# Patient Record
Sex: Female | Born: 1940 | Race: Black or African American | Hispanic: No | Smoking: Never smoker
Health system: Southern US, Community
[De-identification: ages and names within clinical notes are randomized; demographics above are authoritative.]

## PROBLEM LIST (undated history)

## (undated) DIAGNOSIS — M1990 Unspecified osteoarthritis, unspecified site: Secondary | ICD-10-CM

## (undated) DIAGNOSIS — Z8719 Personal history of other diseases of the digestive system: Secondary | ICD-10-CM

## (undated) DIAGNOSIS — J40 Bronchitis, not specified as acute or chronic: Secondary | ICD-10-CM

## (undated) DIAGNOSIS — E785 Hyperlipidemia, unspecified: Secondary | ICD-10-CM

## (undated) DIAGNOSIS — M1711 Unilateral primary osteoarthritis, right knee: Secondary | ICD-10-CM

## (undated) DIAGNOSIS — I10 Essential (primary) hypertension: Secondary | ICD-10-CM

## (undated) DIAGNOSIS — K219 Gastro-esophageal reflux disease without esophagitis: Secondary | ICD-10-CM

## (undated) DIAGNOSIS — E119 Type 2 diabetes mellitus without complications: Secondary | ICD-10-CM

## (undated) DIAGNOSIS — Z8711 Personal history of peptic ulcer disease: Secondary | ICD-10-CM

## (undated) DIAGNOSIS — N189 Chronic kidney disease, unspecified: Secondary | ICD-10-CM

## (undated) HISTORY — PX: ABDOMINAL HYSTERECTOMY: SHX81

## (undated) HISTORY — DX: Type 2 diabetes mellitus without complications: E11.9

## (undated) HISTORY — DX: Hyperlipidemia, unspecified: E78.5

## (undated) HISTORY — DX: Chronic kidney disease, unspecified: N18.9

## (undated) HISTORY — DX: Personal history of other diseases of the digestive system: Z87.19

## (undated) HISTORY — DX: Essential (primary) hypertension: I10

## (undated) HISTORY — PX: APPENDECTOMY: SHX54

---

## 2001-12-19 ENCOUNTER — Inpatient Hospital Stay (HOSPITAL_COMMUNITY): Admission: EM | Admit: 2001-12-19 | Discharge: 2001-12-22 | Payer: Self-pay | Admitting: Emergency Medicine

## 2001-12-19 ENCOUNTER — Encounter: Payer: Self-pay | Admitting: Family Medicine

## 2001-12-21 ENCOUNTER — Encounter: Payer: Self-pay | Admitting: Internal Medicine

## 2005-08-04 ENCOUNTER — Ambulatory Visit: Payer: Self-pay | Admitting: Family Medicine

## 2006-05-23 ENCOUNTER — Ambulatory Visit: Payer: Self-pay | Admitting: Family Medicine

## 2007-03-16 ENCOUNTER — Ambulatory Visit: Payer: Self-pay | Admitting: Family Medicine

## 2007-03-21 ENCOUNTER — Ambulatory Visit: Payer: Self-pay | Admitting: Gastroenterology

## 2007-04-03 ENCOUNTER — Encounter: Payer: Self-pay | Admitting: Gastroenterology

## 2007-04-03 ENCOUNTER — Ambulatory Visit: Payer: Self-pay | Admitting: Gastroenterology

## 2007-05-31 ENCOUNTER — Ambulatory Visit: Payer: Self-pay | Admitting: Family Medicine

## 2007-05-31 LAB — CONVERTED CEMR LAB
ALT: 14 units/L (ref 0–35)
AST: 17 units/L (ref 0–37)
Albumin: 3.4 g/dL — ABNORMAL LOW (ref 3.5–5.2)
Alkaline Phosphatase: 96 units/L (ref 39–117)
BUN: 15 mg/dL (ref 6–23)
Basophils Absolute: 0 10*3/uL (ref 0.0–0.1)
Basophils Relative: 0.1 % (ref 0.0–1.0)
Bilirubin, Direct: 0.1 mg/dL (ref 0.0–0.3)
CO2: 34 meq/L — ABNORMAL HIGH (ref 19–32)
Calcium: 9.8 mg/dL (ref 8.4–10.5)
Chloride: 103 meq/L (ref 96–112)
Cholesterol: 243 mg/dL (ref 0–200)
Creatinine, Ser: 0.9 mg/dL (ref 0.4–1.2)
Direct LDL: 165.1 mg/dL
Eosinophils Absolute: 0.1 10*3/uL (ref 0.0–0.6)
Eosinophils Relative: 1.9 % (ref 0.0–5.0)
GFR calc Af Amer: 80 mL/min
GFR calc non Af Amer: 66 mL/min
Glucose, Bld: 101 mg/dL — ABNORMAL HIGH (ref 70–99)
HCT: 38.9 % (ref 36.0–46.0)
HDL: 47.7 mg/dL (ref >39.0)
Hemoglobin: 12.8 g/dL (ref 12.0–15.0)
Lymphocytes Relative: 40 % (ref 12.0–46.0)
MCHC: 32.9 g/dL (ref 30.0–36.0)
MCV: 86.5 fL (ref 78.0–100.0)
Monocytes Absolute: 0.4 10*3/uL (ref 0.2–0.7)
Monocytes Relative: 10 % (ref 3.0–11.0)
Neutro Abs: 2 10*3/uL (ref 1.4–7.7)
Neutrophils Relative %: 48 % (ref 43.0–77.0)
Platelets: 238 10*3/uL (ref 150–400)
Potassium: 2.9 meq/L — ABNORMAL LOW (ref 3.5–5.1)
RBC: 4.5 M/uL (ref 3.87–5.11)
RDW: 13 % (ref 11.5–14.6)
Sodium: 143 meq/L (ref 135–145)
TSH: 1 microintl units/mL (ref 0.35–5.50)
Total Bilirubin: 0.9 mg/dL (ref 0.3–1.2)
Total CHOL/HDL Ratio: 5.1
Total Protein: 7 g/dL (ref 6.0–8.3)
Triglycerides: 79 mg/dL (ref 0–149)
VLDL: 16 mg/dL (ref 0–40)
WBC: 4.1 10*3/uL — ABNORMAL LOW (ref 4.5–10.5)

## 2007-07-19 ENCOUNTER — Ambulatory Visit: Payer: Self-pay | Admitting: *Deleted

## 2008-06-02 ENCOUNTER — Encounter: Payer: Self-pay | Admitting: Family Medicine

## 2008-06-20 ENCOUNTER — Encounter: Payer: Self-pay | Admitting: *Deleted

## 2008-07-24 ENCOUNTER — Ambulatory Visit: Payer: Self-pay | Admitting: *Deleted

## 2008-08-21 ENCOUNTER — Ambulatory Visit: Payer: Self-pay | Admitting: Family Medicine

## 2008-08-21 DIAGNOSIS — Z8719 Personal history of other diseases of the digestive system: Secondary | ICD-10-CM

## 2008-08-21 DIAGNOSIS — E785 Hyperlipidemia, unspecified: Secondary | ICD-10-CM

## 2008-08-21 DIAGNOSIS — I10 Essential (primary) hypertension: Secondary | ICD-10-CM

## 2008-08-21 LAB — CONVERTED CEMR LAB
ALT: 15 units/L (ref 0–35)
AST: 17 units/L (ref 0–37)
Albumin: 3.7 g/dL (ref 3.5–5.2)
Alkaline Phosphatase: 108 units/L (ref 39–117)
BUN: 14 mg/dL (ref 6–23)
Basophils Absolute: 0 10*3/uL (ref 0.0–0.1)
Basophils Relative: 0.4 % (ref 0.0–3.0)
Bilirubin Urine: NEGATIVE
Bilirubin, Direct: 0.1 mg/dL (ref 0.0–0.3)
Blood in Urine, dipstick: NEGATIVE
CO2: 33 meq/L — ABNORMAL HIGH (ref 19–32)
Calcium: 9.5 mg/dL (ref 8.4–10.5)
Chloride: 102 meq/L (ref 96–112)
Cholesterol: 167 mg/dL (ref 0–200)
Creatinine, Ser: 1 mg/dL (ref 0.4–1.2)
Eosinophils Absolute: 0.1 10*3/uL (ref 0.0–0.7)
Eosinophils Relative: 2.1 % (ref 0.0–5.0)
GFR calc Af Amer: 71 mL/min
GFR calc non Af Amer: 59 mL/min
Glucose, Bld: 98 mg/dL (ref 70–99)
Glucose, Urine, Semiquant: NEGATIVE
HCT: 37.8 % (ref 36.0–46.0)
HDL: 50.9 mg/dL (ref >39.0)
Hemoglobin: 13 g/dL (ref 12.0–15.0)
Ketones, urine, test strip: NEGATIVE
LDL Cholesterol: 104 mg/dL — ABNORMAL HIGH (ref 0–99)
Lymphocytes Relative: 26.4 % (ref 12.0–46.0)
MCHC: 34.4 g/dL (ref 30.0–36.0)
MCV: 85.3 fL (ref 78.0–100.0)
Monocytes Absolute: 0.4 10*3/uL (ref 0.1–1.0)
Monocytes Relative: 8.9 % (ref 3.0–12.0)
Neutro Abs: 3 10*3/uL (ref 1.4–7.7)
Neutrophils Relative %: 62.2 % (ref 43.0–77.0)
Nitrite: NEGATIVE
Platelets: 227 10*3/uL (ref 150–400)
Potassium: 2.7 meq/L — CL (ref 3.5–5.1)
Protein, U semiquant: NEGATIVE
RBC: 4.42 M/uL (ref 3.87–5.11)
RDW: 12.7 % (ref 11.5–14.6)
Sodium: 141 meq/L (ref 135–145)
Specific Gravity, Urine: 1.01
TSH: 1.21 microintl units/mL (ref 0.35–5.50)
Total Bilirubin: 0.9 mg/dL (ref 0.3–1.2)
Total CHOL/HDL Ratio: 3.3
Total Protein: 7.4 g/dL (ref 6.0–8.3)
Triglycerides: 62 mg/dL (ref 0–149)
Urobilinogen, UA: 0.2
VLDL: 12 mg/dL (ref 0–40)
WBC Urine, dipstick: NEGATIVE
WBC: 4.7 10*3/uL (ref 4.5–10.5)
pH: 5.5

## 2008-08-27 ENCOUNTER — Telehealth: Payer: Self-pay | Admitting: Family Medicine

## 2008-10-03 ENCOUNTER — Ambulatory Visit: Payer: Self-pay | Admitting: Family Medicine

## 2008-10-03 LAB — CONVERTED CEMR LAB
BUN: 18 mg/dL
CO2: 33 meq/L — ABNORMAL HIGH
Calcium: 9.4 mg/dL
Chloride: 102 meq/L
Creatinine, Ser: 1 mg/dL
GFR calc Af Amer: 71 mL/min
GFR calc non Af Amer: 59 mL/min
Glucose, Bld: 106 mg/dL — ABNORMAL HIGH
Potassium: 4.3 meq/L
Sodium: 142 meq/L

## 2009-06-03 ENCOUNTER — Encounter: Payer: Self-pay | Admitting: Family Medicine

## 2009-08-20 DIAGNOSIS — J069 Acute upper respiratory infection, unspecified: Secondary | ICD-10-CM | POA: Insufficient documentation

## 2009-08-24 ENCOUNTER — Ambulatory Visit: Payer: Self-pay | Admitting: Family Medicine

## 2009-08-24 LAB — CONVERTED CEMR LAB
Bilirubin Urine: NEGATIVE
Blood in Urine, dipstick: NEGATIVE
Glucose, Urine, Semiquant: NEGATIVE
Ketones, urine, test strip: NEGATIVE
Nitrite: NEGATIVE
Protein, U semiquant: NEGATIVE
Specific Gravity, Urine: 1.015
Urobilinogen, UA: 0.2
WBC Urine, dipstick: NEGATIVE
pH: 6.5

## 2009-08-25 LAB — CONVERTED CEMR LAB
ALT: 15 units/L (ref 0–35)
AST: 20 units/L (ref 0–37)
Albumin: 3.7 g/dL (ref 3.5–5.2)
Alkaline Phosphatase: 94 units/L (ref 39–117)
BUN: 13 mg/dL (ref 6–23)
Basophils Absolute: 0 10*3/uL (ref 0.0–0.1)
Basophils Relative: 0.3 % (ref 0.0–3.0)
Bilirubin, Direct: 0.1 mg/dL (ref 0.0–0.3)
CO2: 33 meq/L — ABNORMAL HIGH (ref 19–32)
Calcium: 9.9 mg/dL (ref 8.4–10.5)
Chloride: 105 meq/L (ref 96–112)
Cholesterol: 246 mg/dL — ABNORMAL HIGH (ref 0–200)
Creatinine, Ser: 1.1 mg/dL (ref 0.4–1.2)
Direct LDL: 189.9 mg/dL
Eosinophils Absolute: 0.1 10*3/uL (ref 0.0–0.7)
Eosinophils Relative: 2.1 % (ref 0.0–5.0)
GFR calc non Af Amer: 63.23 mL/min (ref >60)
Glucose, Bld: 110 mg/dL — ABNORMAL HIGH (ref 70–99)
HCT: 40 % (ref 36.0–46.0)
HDL: 42.8 mg/dL (ref >39.00)
Hemoglobin: 13.4 g/dL (ref 12.0–15.0)
Lymphocytes Relative: 29.9 % (ref 12.0–46.0)
Lymphs Abs: 1.6 10*3/uL (ref 0.7–4.0)
MCHC: 33.5 g/dL (ref 30.0–36.0)
MCV: 86.8 fL (ref 78.0–100.0)
Monocytes Absolute: 0.5 10*3/uL (ref 0.1–1.0)
Monocytes Relative: 9.4 % (ref 3.0–12.0)
Neutro Abs: 3 10*3/uL (ref 1.4–7.7)
Neutrophils Relative %: 58.3 % (ref 43.0–77.0)
Platelets: 247 10*3/uL (ref 150.0–400.0)
Potassium: 3.9 meq/L (ref 3.5–5.1)
RBC: 4.6 M/uL (ref 3.87–5.11)
RDW: 13.4 % (ref 11.5–14.6)
Sodium: 144 meq/L (ref 135–145)
TSH: 1.11 microintl units/mL (ref 0.35–5.50)
Total Bilirubin: 1.1 mg/dL (ref 0.3–1.2)
Total CHOL/HDL Ratio: 6
Total Protein: 7.6 g/dL (ref 6.0–8.3)
Triglycerides: 75 mg/dL (ref 0.0–149.0)
VLDL: 15 mg/dL (ref 0.0–40.0)
WBC: 5.2 10*3/uL (ref 4.5–10.5)

## 2009-09-01 ENCOUNTER — Ambulatory Visit: Payer: Self-pay | Admitting: Family Medicine

## 2010-06-16 ENCOUNTER — Encounter: Payer: Self-pay | Admitting: Family Medicine

## 2010-09-06 ENCOUNTER — Encounter: Payer: Self-pay | Admitting: Family Medicine

## 2010-09-06 ENCOUNTER — Ambulatory Visit: Payer: Self-pay | Admitting: Family Medicine

## 2010-09-06 DIAGNOSIS — M1990 Unspecified osteoarthritis, unspecified site: Secondary | ICD-10-CM | POA: Insufficient documentation

## 2010-09-06 LAB — CONVERTED CEMR LAB
ALT: 12 units/L (ref 0–35)
AST: 19 units/L (ref 0–37)
Albumin: 3.7 g/dL (ref 3.5–5.2)
Alkaline Phosphatase: 107 units/L (ref 39–117)
BUN: 15 mg/dL (ref 6–23)
Basophils Absolute: 0 10*3/uL (ref 0.0–0.1)
Basophils Relative: 0.6 % (ref 0.0–3.0)
Bilirubin Urine: NEGATIVE
Bilirubin, Direct: 0.1 mg/dL (ref 0.0–0.3)
Blood in Urine, dipstick: NEGATIVE
CO2: 32 meq/L (ref 19–32)
Calcium: 10.2 mg/dL (ref 8.4–10.5)
Chloride: 102 meq/L (ref 96–112)
Cholesterol: 173 mg/dL (ref 0–200)
Creatinine, Ser: 1 mg/dL (ref 0.4–1.2)
Eosinophils Absolute: 0.1 10*3/uL (ref 0.0–0.7)
Eosinophils Relative: 1.6 % (ref 0.0–5.0)
GFR calc non Af Amer: 68.01 mL/min (ref >60)
Glucose, Bld: 98 mg/dL (ref 70–99)
Glucose, Urine, Semiquant: NEGATIVE
HCT: 36.9 % (ref 36.0–46.0)
HDL: 47.8 mg/dL (ref >39.00)
Hemoglobin: 12.6 g/dL (ref 12.0–15.0)
Ketones, urine, test strip: NEGATIVE
LDL Cholesterol: 116 mg/dL — ABNORMAL HIGH (ref 0–99)
Lymphocytes Relative: 33 % (ref 12.0–46.0)
Lymphs Abs: 2 10*3/uL (ref 0.7–4.0)
MCHC: 34 g/dL (ref 30.0–36.0)
MCV: 85.4 fL (ref 78.0–100.0)
Monocytes Absolute: 0.5 10*3/uL (ref 0.1–1.0)
Monocytes Relative: 8.8 % (ref 3.0–12.0)
Neutro Abs: 3.4 10*3/uL (ref 1.4–7.7)
Neutrophils Relative %: 56 % (ref 43.0–77.0)
Nitrite: NEGATIVE
Platelets: 286 10*3/uL (ref 150.0–400.0)
Potassium: 5.5 meq/L — ABNORMAL HIGH (ref 3.5–5.1)
Protein, U semiquant: NEGATIVE
RBC: 4.32 M/uL (ref 3.87–5.11)
RDW: 14 % (ref 11.5–14.6)
Sodium: 139 meq/L (ref 135–145)
Specific Gravity, Urine: 1.01
TSH: 1.1 microintl units/mL (ref 0.35–5.50)
Total Bilirubin: 0.5 mg/dL (ref 0.3–1.2)
Total CHOL/HDL Ratio: 4
Total Protein: 6.9 g/dL (ref 6.0–8.3)
Triglycerides: 46 mg/dL (ref 0.0–149.0)
Urobilinogen, UA: 0.2
VLDL: 9.2 mg/dL (ref 0.0–40.0)
WBC Urine, dipstick: NEGATIVE
WBC: 6 10*3/uL (ref 4.5–10.5)
pH: 6

## 2011-01-06 NOTE — Miscellaneous (Signed)
Summary: mammogram   Clinical Lists Changes  Observations: Added new observation of MAMMO DUE: 06/2011 (06/16/2010 11:45) Added new observation of MAMMOGRAM: stable mammogram (06/14/2010 11:45)      Preventive Care Screening  Mammogram:    Date:  06/14/2010    Next Due:  06/2011    Results:  stable mammogram

## 2011-01-06 NOTE — Assessment & Plan Note (Signed)
Summary: emp--will fast//ccm   Vital Signs:  Patient profile:   70 year old female Menstrual status:  hysterectomy Height:      60.5 inches Weight:      146 pounds BMI:     28.15 Temp:     98.7 degrees F oral BP sitting:   128 / 88  (left arm) Cuff size:   regular  Vitals Entered By: Kern Reap CMA Duncan Dull) (September 06, 2010 9:35 AM) CC: annual wellness exam Is Patient Diabetic? Yes Pain Assessment Patient in pain? no          Menstrual Status hysterectomy   CC:  annual wellness exam.  History of Present Illness: Maria [name] is a 70 year old female who comes in today for a Medicare wellness examination because of underlying hyperlipidemia, hypertension, reflux, esophagitis.  Her hyperlipidemia is treated with simvastatin 20 mg daily will check lipid panel today.  Her hypertension is treated with Tenoretic 50 -- 25 daily and a potassium supplement 20 mEq b.i.d.  BP 120/80.  She is currently getting injections from her orthopedist for knee pain........supartz  She takes calcium and vitamin D, and 81 mg, baby aspirin, and Prilosec 20 mg OTC for reflux esophagitis.  She gets routine eye care, dental care, BSE monthly, any mammography, colonoscopy, normal, tetanus, 2003, Pneumovax 2007, shingles 2009, seasonal flu shot today Here for Medicare AWV:  1.   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   Physical Activities: walks daily 3.   Depression/mood: good mood.  No depression 4.   Hearing: normal 5.   ADL's: able to care for herself in her household 6.   Fall Risk: reviewed.  No risk identified 7.   Home Safety: no guns in the home 8.   Height, weight, &visual acuity:height weight, normal.  Vision normal 9.   Counseling: continue her diet and exercise program and medications 10.   Labs ordered based on risk factors: done today 11.            Referral Coordination...Marland KitchenMarland KitchenMarland Kitchennone indicated 13.            Cognitive Assessment ......Marland Kitchenpatient is oriented x 3 able to perform her own finances  Allergies: No Known Drug Allergies  Past History:  Past medical, surgical, family and social histories (including risk factors) reviewed, and no changes noted (except as noted below).  Past Medical History: Reviewed history from 08/21/2008 and no changes required. Hyperlipidemia Hypertension Peripheral vascular disease Gastrointestinal hemorrhage, hx of TAH and BSO for nonmalignant reasons  Family History: Reviewed history from 08/21/2008 and no changes required.  father died at 70-mm cause mother died at 60 from coronary disease.  No brothers no sisters  Social History: Reviewed history from 08/21/2008 and no changes required. Occupation: Single Never Smoked Alcohol use-no Drug use-no Regular exercise-yes  Review of Systems      See HPI  Physical Exam  General:  Well-developed,well-nourished,in no acute distress; alert,appropriate and cooperative throughout examination Head:  Normocephalic and atraumatic without obvious abnormalities. No apparent alopecia or balding. Eyes:  No corneal or conjunctival inflammation noted. EOMI. Perrla. Funduscopic exam benign, without hemorrhages, exudates or papilledema. Vision grossly normal. Ears:  External ear exam shows no significant lesions or deformities.  Otoscopic examination reveals clear canals, tympanic membranes are intact bilaterally without bulging, retraction, inflammation or discharge. Hearing is grossly normal bilaterally. Nose:  External nasal  examination shows no deformity or inflammation. Nasal mucosa are pink and moist without lesions or exudates. Mouth:  Oral mucosa and oropharynx without lesions or exudates.  Teeth in good repair. Neck:  No deformities, masses, or tenderness noted. Chest Wall:  No deformities, masses, or tenderness noted. Breasts:  No mass, nodules,  thickening, tenderness, bulging, retraction, inflamation, nipple discharge or skin changes noted.   Lungs:  Normal respiratory effort, chest expands symmetrically. Lungs are clear to auscultation, no crackles or wheezes. Heart:  Normal rate and regular rhythm. S1 and S2 normal without gallop, murmur, click, rub or other extra sounds. Abdomen:  Bowel sounds positive,abdomen soft and non-tender without masses, organomegaly or hernias noted. Rectal:  No external abnormalities noted. Normal sphincter tone. No rectal masses or tenderness. Genitalia:  Pelvic Exam:        External: normal female genitalia without lesions or masses        Vagina: normal without lesions or masses        Cervix: normal without lesions or masses        Adnexa: normal bimanual exam without masses or fullness        Uterus: normal by palpation        Pap smear: not performed Msk:  No deformity or scoliosis noted of thoracic or lumbar spine.   Pulses:  R and L carotid,radial,femoral,dorsalis pedis and posterior tibial pulses are full and equal bilaterally Extremities:  No clubbing, cyanosis, edema, or deformity noted with normal full range of motion of all joints.   Neurologic:  No cranial nerve deficits noted. Station and gait are normal. Plantar reflexes are down-going bilaterally. DTRs are symmetrical throughout. Sensory, motor and coordinative functions appear intact. Skin:  Intact without suspicious lesions or rashes Cervical Nodes:  No lymphadenopathy noted Axillary Nodes:  No palpable lymphadenopathy Inguinal Nodes:  No significant adenopathy Psych:  Cognition and judgment appear intact. Alert and cooperative with normal attention span and concentration. No apparent delusions, illusions, hallucinations   Problems:  Medical Problems Added: 1)  Dx of Routine General Medical Exam@health  Care [facility]  (ICD-V70.0) 2)  Dx of Osteoarthrosis, Unspecified Site  (ICD-715.90)  Impression & Recommendations:  Problem # 1:   HYPERTENSION (ICD-401.9) Assessment Improved  Her updated medication list for this problem includes:    Atenolol-chlorthalidone 50-25 Mg Tabs (Atenolol-chlorthalidone) ..... Once daily    Her updated medication list for this problem includes:    Atenolol-chlorthalidone 50-25 Mg Tabs (Atenolol-chlorthalidone) ..... Once daily  Orders: Venipuncture (16109) Prescription Created Electronically [phone]) Medicare -1st Annual Wellness Visit [phone]) Urinalysis-dipstick only (Medicare patient) (91478GN) Specimen Handling (56213) EKG w/ Interpretation (93000) TLB-Lipid Panel (80061-LIPID) TLB-BMP (Basic Metabolic Panel-BMET) (80048-METABOL) TLB-CBC Platelet - w/Differential (85025-CBCD) TLB-TSH (Thyroid Stimulating Hormone) (84443-TSH) TLB-Hepatic/Liver Function Pnl (80076-HEPATIC)  Problem # 2:  HYPERLIPIDEMIA (ICD-272.4) Assessment: Improved  Her updated medication list for this problem includes:    Simvastatin 20 Mg Tabs (Simvastatin) ..... Once daily    Her updated medication list for this problem includes:    Simvastatin 20 Mg Tabs (Simvastatin) ..... Once daily  Orders: Venipuncture (08657) Prescription Created Electronically [phone]) Medicare -1st Annual Wellness Visit [phone]) Urinalysis-dipstick only (Medicare patient) (41324MW) Specimen Handling (10272) EKG w/ Interpretation (93000) TLB-Lipid Panel (80061-LIPID) TLB-BMP (Basic Metabolic Panel-BMET) (80048-METABOL) TLB-CBC Platelet - w/Differential (85025-CBCD) TLB-TSH (Thyroid Stimulating Hormone) (84443-TSH) TLB-Hepatic/Liver Function Pnl (80076-HEPATIC)  Problem # 3:  Preventive Health Care (ICD-V70.0) Assessment: Unchanged  Problem # 4:  OSTEOARTHROSIS, UNSPECIFIED SITE (ICD-715.90) Assessment: Improved  Her updated medication list for this problem  includes:    Adult Aspirin Low Strength 81 Mg Tbdp (Aspirin) ..... Once daily    Her updated medication list for this problem includes:    Adult Aspirin Low  Strength 81 Mg Tbdp (Aspirin) ..... Once daily  Orders: Venipuncture (60454) Prescription Created Electronically [phone]) Medicare -1st Annual Wellness Visit [phone]) Urinalysis-dipstick only (Medicare patient) [phone]) Specimen Handling (08657) TLB-Lipid Panel (80061-LIPID) TLB-BMP (Basic Metabolic Panel-BMET) (80048-METABOL) TLB-CBC Platelet - w/Differential (85025-CBCD) TLB-TSH (Thyroid Stimulating Hormone) (84443-TSH) TLB-Hepatic/Liver Function Pnl (80076-HEPATIC)  Complete Medication List: 1)  Calcium Plus Vitamin D 600-100 Mg-unit Caps (Calcium carbonate-vitamin d) .... Once daily 2)  Simvastatin 20 Mg Tabs (Simvastatin) .... Once daily 3)  Adult Aspirin Low Strength 81 Mg Tbdp (Aspirin) .... Once daily 4)  Atenolol-chlorthalidone 50-25 Mg Tabs (Atenolol-chlorthalidone) .... Once daily 5)  Prilosec Otc 20 Mg Tbec (Omeprazole magnesium) .... Once daily 6)  Potassium Chloride Crys Cr 20 Meq Cr-tabs (Potassium chloride crys cr) .... Take one tab two times a day 7)  Glucosamine 500 Mg Caps (Glucosamine sulfate) .... Take one tab by mouth once daily 8)  Supartz 25 Mg/2.63ml Soln (Sodium hyaluronate (viscosup)) .... Use as directed  Other Orders: Influenza Vaccine MCR (84696)  Patient Instructions: 1)  Please schedule a follow-up appointment in 1 year. 2)  It is important that you exercise regularly at least 20 minutes 5 times a week. If you develop chest pain, have severe difficulty breathing, or feel very tired , stop exercising immediately and seek medical attention. 3)  Schedule your mammogram. 4)  Schedule a colonoscopy/sigmoidoscopy to help detect colon cancer. 5)  Take calcium +Vitamin D daily. 6)  Take an Aspirin every day. 7)  If the leg cramps are bothering her a lot decreased the simvastatin to one tablet Monday, Wednesday, Friday Prescriptions: POTASSIUM CHLORIDE CRYS CR 20 MEQ CR-TABS (POTASSIUM CHLORIDE CRYS CR) take one tab two times a day  #200 x 3   Entered and  Authorized by:   Roderick Pee MD   Signed by:   Roderick Pee MD on 09/06/2010   Method used:   Electronically to        [facility] (retail)       [address]       [place], [place]  [id number]       Ph: [phone]       Fax: [phone]   RxID:   [id number] ATENOLOL-CHLORTHALIDONE 50-25 MG  TABS (ATENOLOL-CHLORTHALIDONE) once daily  #100 x 3   Entered and Authorized by:   Roderick Pee MD   Signed by:   Roderick Pee MD on 09/06/2010   Method used:   Electronically to        [facility] (retail)       [address]       [place], [place]  [id number]       Ph: [phone]       Fax: [phone]   RxID:   [id number] SIMVASTATIN 20 MG  TABS (SIMVASTATIN) once daily  #100 x 3   Entered and Authorized by:   Roderick Pee MD   Signed by:   Roderick Pee MD on 09/06/2010   Method used:   Electronically to        [facility] (retail)       [address]       [place], [place]  [id number]       Ph:  [phone]       Fax: [phone]   RxID:   [id number]    Immunizations Administered:  Influenza Vaccine # 1:    Vaccine Type: Fluvax MCR    Site: left deltoid    Mfr: GlaxoSmithKline    Dose: 0.5 ml    Route: IM    Given by: Kern Reap CMA (AAMA)    Exp. Date: 06/04/2011    Lot #: [id number]    VIS given: 06/29/10 version given September 06, 2010.    Physician counseled: yes   Laboratory Results   Urine Tests  Date/Time Received: September 06, 2010   Routine Urinalysis   Color: yellow Appearance: Clear Glucose: negative   (Normal Range: Negative) Bilirubin: negative   (Normal Range: Negative) Ketone: negative   (Normal Range: Negative) Spec. Gravity: 1.010   (Normal Range: 1.003-1.035) Blood: negative   (Normal Range: Negative) pH: 6.0   (Normal Range: 5.0-8.0) Protein: negative   (Normal Range: Negative) Urobilinogen: 0.2   (Normal Range: 0-1) Nitrite: negative    (Normal Range: Negative) Leukocyte Esterace: negative   (Normal Range: Negative)    Comments: Kern Reap CMA (AAMA)  September 06, 2010 11:45 AM

## 2011-04-19 NOTE — Consult Note (Signed)
NEW PATIENT CONSULTATION   [name], [name]  DOB:  [birth year]                                       07/19/2007  NFAOZ#:[id number]   REFERRING PHYSICIAN:  Dr. Kelle Darting   REASON FOR CONSULTATION:  Extracranial cerebrovascular occlusive  disease.   HISTORY:  The patient is a 70 year old female whom I know through having  cared for her mother in the past.  She is referred to the office today  for evaluation and management of carotid artery problems.  She underwent  a carotid Doppler evaluation dated January 2003 which revealed a 60-80%  left ICA stenosis and no significant right ICA stenosis.   She underwent followup carotid Doppler evaluation in the office at this  time, and this failed to reveal any evidence of significant ICA  stenosis.  She does have mild plaque at the carotid bifurcation  bilaterally.  Antegrade vertebral flow bilaterally.   There is no history of stroke.  The patient denies symptoms of TIA,  specifically sensory, motor, or visual deficit.   PAST MEDICAL HISTORY:  1. Hypertension.  2. Hyperlipidemia.  3. Peripheral vascular disease.   MEDICATIONS:  1. Atenolol/hydrochlorothiazide 50/25, 1/2 tab daily.  2. Enteric-coated aspirin 81 mg daily.  3. Simvastatin 20 mg daily.  4. Prilosec 20 mg daily.  5. Calcium with vitamin D 600 mg 2 tabs q.a.m.   ALLERGIES:  None known.   SOCIAL HISTORY:  The patient is single.  She has no children.  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  Does not smoke or use alcohol  products.   REVIEW OF SYSTEMS:  The patient denies any weight change.  No recent  shortness of breath or cough.  Denies chest pain.  No abdominal pain,  nausea, vomiting, constipation, or diarrhea.  No dysuria or frequency.  She does have some trunk discomfort and arthritis.   PHYSICAL EXAMINATION:  General:  A 70 year old African-American female,  appears generally well.  Alert and oriented.  Vital signs:  BP  147/88,  pulse 78 per minute, respirations 18 per minute.  HEENT:  Mouth and  throat are clear.  Normocephalic.  Extraocular movements intact.  Neck:  Supple, no thyromegaly or adenopathy.  Cardiovascular:  Soft right  carotid bruit.  Normal heart sounds.  No murmurs.  Chest:  Equal air  entry bilaterally without rales or rhonchi.  Abdomen:  Soft, nontender.  No masses or organomegaly.  Lower extremities:  2+ femoral pulses  bilaterally.  No ankle edema.  Neurologic:  Cranial nerves are intact.  Strength equal bilaterally.  Reflexes 1+.  Gait normal.   IMPRESSION:  1. 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  2. Hypertension.  3. Hyperlipidemia.   RECOMMENDATIONS:  1. One year followup with repeat carotid Doppler evaluation.  2. Continue Aspirin 81 mg daily.  3. Continue cholesterol-lowering agents.   Balinda Quails, M.D.  Electronically Signed   PGH/MEDQ  D:  07/19/2007  T:  07/20/2007  Job:  [id number]

## 2011-04-19 NOTE — Procedures (Signed)
CAROTID DUPLEX EXAM   INDICATION:  Followup, carotid artery disease.   HISTORY:  Diabetes:  No.  Cardiac:  No.  Hypertension:  Yes.  Smoking:  No.  Previous Surgery:  No.  CV History:  No.  Amaurosis Fugax No, Paresthesias No, Hemiparesis No.                                       RIGHT             LEFT  Brachial systolic pressure:         130               132  Brachial Doppler waveforms:         Triphasic         Triphasic  Vertebral direction of flow:        Antegrade         Antegrade  DUPLEX VELOCITIES (cm/sec)  CCA peak systolic                   116               106  ECA peak systolic                   102               72  ICA peak systolic                   90                99  ICA end diastolic                   37                21  PLAQUE MORPHOLOGY:                  Heterogenous      Heterogenous  PLAQUE AMOUNT:                      Mild              Mild  PLAQUE LOCATION:                    ICA               ICA   IMPRESSION:  1. Bilateral internal carotid arteries show evidence of 1-39%      stenosis.  2. No significant changes from previous study.  3. Note:  Bilateral high bifurcations with tortuous internal carotid      arteries.   ___________________________________________  P. Liliane Bade, M.D.   AS/MEDQ  D:  07/24/2008  T:  07/24/2008  Job:  [id number]

## 2011-04-19 NOTE — Procedures (Signed)
CAROTID DUPLEX EXAM   INDICATION:  Followup known carotid artery disease.   HISTORY:  Diabetes:  No.  Cardiac:  No.  Hypertension:  Yes.  Smoking:  No.  Previous Surgery:  CV History:  Amaurosis Fugax Yes No, Paresthesias Yes No hemiparesis Yes No                                       RIGHT             LEFT  Brachial systolic pressure:         130               120  Brachial Doppler waveforms:         Biphasic.         Biphasic.  Vertebral direction of flow:        Antegrade.        Antegrade.  DUPLEX VELOCITIES (cm/sec)  CCA peak systolic                   79                113  ECA peak systolic                   120               81  ICA peak systolic                   86                61  ICA end diastolic                   23                12  PLAQUE MORPHOLOGY:                  Heterogeneous.    Heterogeneous.  PLAQUE AMOUNT:                      Mild.             Mild.  PLAQUE LOCATION:                    ICA.              ICA.   IMPRESSION:  1. One to 39 percent stenosis noted in bilateral internal carotid      arteries.  2. Antegrade bilateral vertebral arteries.   ___________________________________________  P. Liliane Bade, M.D.   MG/MEDQ  D:  07/19/2007  T:  07/20/2007  Job:  [id number]

## 2011-04-22 NOTE — Discharge Summary (Signed)
[place]. [facility]  Patient:    ANWEN, CANNEDY Visit Number: [id number] MRN: [id number]          Service Type: MED Location: [phone] Attending Physician:  Rogelia Boga Dictated by:   Cornell Barman, P.A. Admit Date:  12/19/2001 Discharge Date: 12/22/2001   CC:         Evette Georges, M.D. [facility] T. Pleas Koch., M.D. [facility]   Discharge Summary  DISCHARGE DIAGNOSES: 1. Gastrointestinal bleed. 2. Normocytic anemia. 3. Peripheral vascular disease.  BRIEF ADMISSION HISTORY:  Ms. Payes is a 70 year old African American female who presents to office with complaints of dark, tarry bowel movements. Stool on exam was positive for blood and the hemoglobin was found to be 6.8. She was admitted for further GI evaluation and transfusion.  PAST MEDICAL HISTORY: 1. History of total abdominal hysterectomy and bilateral salpingo-oophorectomy    in the 1970s secondary to fibroid tumors. 2. History of viral gastroenteritis. 3. History of hypertension. 4. History of hyperlipidemia, although she had not been taking any medications    prior to this admission.  HOSPITAL COURSE: #1 - NORMOCYTIC ANEMIA:  The patient was transfused.  Unfortunately, iron studies were not obtained.  She received 2 units and has remained hemodynamically stable since her transfusion.  #2 - The patient presented with evidence of acute GI bleed with anemia and heme-positive stools.  The patient underwent endoscopy by Dr. Russella Dar on December 20, 2001.  This revealed an ulcer in the duodenal bulb not bleeding. RUT was done.  She did also have a stricture in the duodenal bulb.  This was partial and likely from prior ulcer disease.  H. pylori screen was positive. The patient will complete treatment for the next 10 days.  She has been advised not to take aspirin or nonsteroidals for at least eight weeks.  The patient can follow up with Dr. Tawanna Cooler and does not need a  followup with Dr. Russella Dar unless needed.  #3 - PERIPHERAL VASCULAR DISEASE:  Dr. Tawanna Cooler ordered carotid Dopplers because she did have a right carotid bruit.  This revealed no ICA stenosis on the right; however, she did have left 60-80% ICA stenosis at the high end of the scale but could be secondary to tortuosity.  We will have Dr. Tawanna Cooler pursue and outpatient vascular consult.  #4 - INFECTIOUS DISEASE:  The patient did have a transient low-grade fever and most likely was secondary to her transfusion.  Her white is normal and she did not receive antibiotics.  DISCHARGE LABORATORY DATA:  Hemoglobin 10.5, hematocrit 30.6.  Coags were normal.  Helicobacter screen was urease positive.  DISCHARGE MEDICATIONS: 1. Protonix 40 mg b.i.d. for 10 days then q.d. 2. Metronidazole 500 mg 1 pill b.i.d. for 10 days. 3. Clarithromycin 500 mg 1 pill b.i.d. for 10 days. 4. A baby aspirin can be restarted in three weeks 81 mg size.  FOLLOWUP:  The patient to follow up with Dr. Tawanna Cooler in 1-2 weeks. Dictated by:   Cornell Barman, P.A. Attending Physician:  Rogelia Boga DD:  12/21/01 TD:  12/24/01 Job: [id number]

## 2011-04-22 NOTE — Discharge Summary (Signed)
[place]. [facility]  Patient:    Maria [name], Maria [name] Visit Number: [id number] MRN: [id number]          Service Type: MED Location: [phone] Attending Physician:  Rogelia Boga Dictated by:   Gordy Savers, M.D. [facility] Admit Date:  12/19/2001 Discharge Date: 12/22/2001                             Discharge Summary  FINAL DIAGNOSES: 1. Upper gastrointestinal bleed. 2. Severe anemia. 3. Helicobacter pylori positive peptic ulcer disease. 4. Hypertension. 5. Right mid lung density. 6. Carotid artery disease with 60-80% left internal carotid artery stenosis.  DISCHARGE MEDICATIONS: 1. Protonix 40 mg b.i.d. for seven days followed by 40 mg once daily. 2. Metronidazole 500 mg b.i.d. for 17 days. 3. Biaxin 500 mg b.i.d. for 14 days. 4. Hydrochlorothiazide 25 mg daily.  STUDIES PENDING AT THE TIME OF DISCHARGE:  CT scan of the chest.  HISTORY OF PRESENT ILLNESS:  The patient is a 70 year old widowed black female who was admitted to the hospital for evaluation of upper GI bleeding and secondary anemia.  She presented with a five-day history of melena and was noted to have a hemoglobin of 6.8.  She was subsequently admitted for further evaluation and treatment.  HOSPITAL COURSE:  The patient was admitted to the hospital, where she was supported with IV fluids.  Blood was typed and crossed and the patient was transfused 2 units of packed RBCs without difficulty.  Serial H&Hs were monitored.  The patient remained hemodynamically stable and following blood transfusions her H&H remained stable.  She was treated with IV Protonix 40 mg daily and the second hospital day had upper panendoscopy performed.  This revealed ulceration and subsequent testing for H. pylori was positive. Laboratory studies were unremarkable.  Admission white count was 6.8. Coagulation screen negative.  Chemistries were unremarkable.  Urinalysis negative.  Chest x-ray  revealed heart size to be slightly enlarged.  There was a vague nodular density in the right mid lung field.  Prior to her discharge, the patient did have a CT scan done of the chest but the results are pending.  DISPOSITION:  The patient will be discharged to complete triple therapy for H. pylori positive peptic ulcer disease.  She will resume her hydrochlorothiazide and has been asked to follow up with Dr. Tawanna Cooler in one week.  CONDITION ON DISCHARGE:  Stable. Dictated by:   Gordy Savers, M.D. [facility] Attending Physician:  Rogelia Boga DD:  12/22/01 TD:  12/24/01 Job: [phone] [id number]

## 2011-04-22 NOTE — H&P (Signed)
[place]. [facility]  Patient:    Maria [name], Maria [name] Visit Number: [id number] MRN: [id number]          Service Type: Attending:  Evette Georges, M.D. [facility] Dictated by:   Evette Georges, M.D. [facility] Adm. Date:  12/19/01                           History and Physical  MEDICAL RECORD UNIT NUMBER AT [facility]:  [phone]. DATE OF BIRTH:  September 17, 1941. SOCIAL SECURITY NO. [phone].  HISTORY OF PRESENT ILLNESS:  This is the second [facility] admission for this 70 year old widowed black female who comes into the hospital for evaluation of GI bleeding.  The patient states she felt well until five days ago, when she noticed her bowel movements were dark and tarry, like road tar. She had no pain.  She came to the office today after five days of this for evaluation.  History is otherwise noncontributory.  She was seen, abdominal exam was negative, stool was positive, hemoglobin 6.8.  Therefore, she was admitted with an acute GI bleed for evaluation and transfusion.  PAST MEDICAL HISTORY:  She has been hospitalized for TAH and BSO in the 1970s, it was for fibroids, no cancer.  She also had one previous hospitalization for a viral gastroenteritis.  Outpatient surgery:  None.  Illnesses:  None. Injuries:  None.  DRUG ALLERGIES:  None.  SOCIAL HISTORY:  She does not smoke or drink any alcohol.  MEDICATION HISTORY:  She has been on no medicines recently.  The last time I saw her was 70.  At that time she was on HCTZ 25 mg q.d. for hypertension and Lipitor 10 mg q.h.s. for hyperlipidemia, and Premarin because of postmenopausal symptoms.  She has not taken any medicines.  She does not have any insurance and no pharmacy benefit.  REVIEW OF SYSTEMS:  Head, eyes, ears, nose, and throat were negative except she wears glasses.  ENT:  Negative.  CARDIOPULMONARY:  Negative. GASTROINTESTINAL:  Negative.  Specifically, she has not had any previous history of GI  bleed in the past.  She has not had any abdominal pain, and she has not had any unexpected weight loss.  GENITOURINARY:  Negative. GYNECOLOGIC:  She is gravida 0, para 0.  She had a TAH and BSO. She has two adopted children.  ENDOCRINE:  Negative.  Weight has been steady.  She lost 20 pounds a couple of years ago, and that is probably why her blood pressure has stayed fairly normal.  MUSCULOSKELETAL:  Negative.  VASCULAR:  Negative. ALLERGY:  Negative.  SOCIAL HISTORY:  She is widowed.  She lives here in [place].  She takes care of her 43+-year-old mom.  FAMILY HISTORY:  Noncontributory.  VACCINATION HISTORY:  I do not have any shot record in her chart.  It is at the [facility] office.  IMPRESSION: 1. Acute gastrointestinal bleed.  Plan:  Admit, transfusion, gastrointestinal    evaluation. 2. Status post total abdominal hysterectomy and bilateral salpingo-    oophorectomy for nonmalignant reasons, fibroids. 3. Hypertension.  Will need to restart her hydrochlorothiazide as soon as    possible. 4. History of hyperlipidemia.  Will check lipids, get started back on    the Lipitor. 5. History of right carotid bruit.  Will get carotid Dopplers while she is in    the hospital. Dictated by:   Evette Georges, M.D. [facility] Attending:  Tinnie Gens  Shyrl Numbers, M.D. [facility] DD:  12/19/01 TD:  12/19/01 Job: [id number]

## 2011-06-28 LAB — HM MAMMOGRAPHY

## 2011-06-29 ENCOUNTER — Encounter: Payer: Self-pay | Admitting: Family Medicine

## 2011-09-07 ENCOUNTER — Encounter: Payer: Self-pay | Admitting: Family Medicine

## 2011-09-08 ENCOUNTER — Encounter: Payer: Self-pay | Admitting: Family Medicine

## 2011-09-08 ENCOUNTER — Ambulatory Visit (INDEPENDENT_AMBULATORY_CARE_PROVIDER_SITE_OTHER): Payer: Managed Care, Other (non HMO) | Admitting: Family Medicine

## 2011-09-08 DIAGNOSIS — Z23 Encounter for immunization: Secondary | ICD-10-CM

## 2011-09-08 DIAGNOSIS — E785 Hyperlipidemia, unspecified: Secondary | ICD-10-CM

## 2011-09-08 DIAGNOSIS — Z0000 Encounter for general adult medical examination without abnormal findings: Secondary | ICD-10-CM

## 2011-09-08 DIAGNOSIS — I10 Essential (primary) hypertension: Secondary | ICD-10-CM

## 2011-09-08 LAB — CBC WITH DIFFERENTIAL/PLATELET
Eosinophils Relative: 1.4 % (ref 0.0–5.0)
HCT: 39.2 % (ref 36.0–46.0)
Hemoglobin: 13 g/dL (ref 12.0–15.0)
Lymphocytes Relative: 33.3 % (ref 12.0–46.0)
Lymphs Abs: 1.7 10*3/uL (ref 0.7–4.0)
Monocytes Relative: 8.5 % (ref 3.0–12.0)
Neutro Abs: 2.9 10*3/uL (ref 1.4–7.7)
WBC: 5.1 10*3/uL (ref 4.5–10.5)

## 2011-09-08 LAB — POCT URINALYSIS DIPSTICK
Bilirubin, UA: NEGATIVE
Glucose, UA: NEGATIVE
Ketones, UA: NEGATIVE
Leukocytes, UA: NEGATIVE
Protein, UA: NEGATIVE
Spec Grav, UA: 1.015

## 2011-09-08 LAB — HEPATIC FUNCTION PANEL
ALT: 13 U/L (ref 0–35)
Albumin: 3.8 g/dL (ref 3.5–5.2)
Alkaline Phosphatase: 106 U/L (ref 39–117)
Total Protein: 7.4 g/dL (ref 6.0–8.3)

## 2011-09-08 LAB — BASIC METABOLIC PANEL
CO2: 32 mEq/L (ref 19–32)
Calcium: 10.4 mg/dL (ref 8.4–10.5)
Chloride: 105 mEq/L (ref 96–112)
Glucose, Bld: 107 mg/dL — ABNORMAL HIGH (ref 70–99)
Sodium: 142 mEq/L (ref 135–145)

## 2011-09-08 LAB — LDL CHOLESTEROL, DIRECT: Direct LDL: 155.9 mg/dL

## 2011-09-08 LAB — LIPID PANEL: HDL: 51.5 mg/dL (ref >39.00)

## 2011-09-08 MED ORDER — POTASSIUM CHLORIDE CRYS ER 20 MEQ PO TBCR: 20.0000 meq | EXTENDED_RELEASE_TABLET | Freq: Two times a day (BID) | ORAL | Status: DC

## 2011-09-08 MED ORDER — ATENOLOL-CHLORTHALIDONE 50-25 MG PO TABS: 1.0000 | ORAL_TABLET | Freq: Every day | ORAL | Status: DC

## 2011-09-08 MED ORDER — SIMVASTATIN 20 MG PO TABS: 20.0000 mg | ORAL_TABLET | Freq: Every day | ORAL | Status: DC

## 2011-09-08 NOTE — Patient Instructions (Signed)
Continue your current medications.  Follow-up in one year or sooner if any problems 

## 2011-09-08 NOTE — Progress Notes (Signed)
  Subjective:    Patient ID: Maria [name], female    DOB: 04/05/1941, 70 y.o.   MRN: [id number]  Hyperlipidemia   Maria [name] is a 70 year old single female, nonsmoker, who comes in today for Medicare wellness examination because of a history of hypertension and hyperlipidemia.  She takes Tenoretic 50 days 12.5 daily for hypertension.  BP 140/90.  She takes Zocor 20 mg daily for hyperlipidemia, along with an aspirin tablet.  We will check laboratory today.  She gets routine eye care, hearing normal, greater dental care, BSE monthly, and he mammography, colonoscopy, normal in GI, tetanus, 2003, shingles 2009, Pneumovax 2007...... Booster today..... And flu shot today.  Cognitive function, normal.  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  She is able to manage her own finances.  Cognitive function, normal.  Activities of daily living.  She walks on a regular basis with the children.  Home health safety reviewed.  No issues identified.  No guns in the house.  She does have a healthcare power of attorney and living will.   Review of Systems  Constitutional: Negative.   HENT: Negative.   Eyes: Negative.   Respiratory: Negative.   Cardiovascular: Negative.   Gastrointestinal: Negative.   Genitourinary: Negative.   Musculoskeletal: Negative.   Neurological: Negative.   Hematological: Negative.   Psychiatric/Behavioral: Negative.        Objective:   Physical Exam  Constitutional: She appears well-developed and well-nourished.  HENT:  Head: Normocephalic and atraumatic.  Right Ear: External ear normal.  Left Ear: External ear normal.  Nose: Nose normal.  Mouth/Throat: Oropharynx is clear and moist.  Eyes: EOM are normal. Pupils are equal, round, and reactive to light.  Neck: Normal range of motion. Neck supple. No thyromegaly present.  Cardiovascular: Normal rate, regular rhythm, normal heart sounds and intact distal pulses.  Exam reveals no gallop and no friction rub.    No murmur heard. Pulmonary/Chest: Effort normal and breath sounds normal.  Abdominal: Soft. Bowel sounds are normal. She exhibits no distension and no mass. There is no tenderness. There is no rebound.  Genitourinary: Vagina normal. Guaiac negative stool. No vaginal discharge found.       Bilateral breast exam normal  Musculoskeletal: Normal range of motion.  Lymphadenopathy:    She has no cervical adenopathy.  Neurological: She is alert. She has normal reflexes. No cranial nerve deficit. She exhibits normal muscle tone. Coordination normal.  Skin: Skin is warm and dry.  Psychiatric: She has a normal mood and affect. Her behavior is normal. Judgment and thought content normal.          Assessment & Plan:  Healthy female.  Hypertension.  Continue Tenoretic.  Hyperlipidemia.  Continue Zocor and aspirin.  Check labs.  Return in one year, sooner if any problems

## 2011-09-24 ENCOUNTER — Other Ambulatory Visit: Payer: Self-pay | Admitting: Family Medicine

## 2011-11-14 ENCOUNTER — Encounter: Payer: Self-pay | Admitting: Family Medicine

## 2011-11-14 ENCOUNTER — Telehealth: Payer: Self-pay | Admitting: Family Medicine

## 2011-11-14 ENCOUNTER — Ambulatory Visit (INDEPENDENT_AMBULATORY_CARE_PROVIDER_SITE_OTHER): Payer: Managed Care, Other (non HMO) | Admitting: Family Medicine

## 2011-11-14 DIAGNOSIS — J45901 Unspecified asthma with (acute) exacerbation: Secondary | ICD-10-CM | POA: Insufficient documentation

## 2011-11-14 DIAGNOSIS — J069 Acute upper respiratory infection, unspecified: Secondary | ICD-10-CM

## 2011-11-14 MED ORDER — PREDNISONE 20 MG PO TABS: ORAL_TABLET | ORAL | Status: DC

## 2011-11-14 MED ORDER — HYDROCODONE-HOMATROPINE 5-1.5 MG/5ML PO SYRP: ORAL_SOLUTION | ORAL | Status: DC

## 2011-11-14 NOTE — Telephone Encounter (Signed)
Patient would like to be worked in with Dr Maria [name] today. Has chest congestion and coughing. Please advise patient and return her call. Thanks.

## 2011-11-14 NOTE — Progress Notes (Signed)
  Subjective:    Patient ID: Maria [name], female    DOB: 09/10/1941, 70 y.o.   MRN: [id number]  HPI Melitta is a 70 year old single female, nonsmoker, who comes in with a week's history of head congestion, running nose, and cough, and now for the past 3 days has been wheezing.  She is a nonsmoker.  No history of asthma or other lung disease.  She states she slept well last night except for the coughing.   Review of Systems    General pulmonary view systems otherwise negative.  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  HEENT negative.  Neck was supple.  No adenopathy.  Lungs are clear except for very mild late expiratory wheezing, expiratory rate 12 and unlabored       Assessment & Plan:  Viral syndrome.  Plan treat symptomatically with fluids, Tylenol.  Secondary mild asthma.  Plan prednisone burst and taper

## 2011-11-14 NOTE — Patient Instructions (Signed)
Drink lots of liquids.  Take the prednisone as directed for the wheezing.  Hydromet one half to 1 teaspoon at bedtime p.r.n. For cough.  Return p.r.n.

## 2011-11-14 NOTE — Telephone Encounter (Signed)
Appt made

## 2011-12-12 ENCOUNTER — Encounter: Payer: Self-pay | Admitting: Family Medicine

## 2011-12-12 ENCOUNTER — Ambulatory Visit (INDEPENDENT_AMBULATORY_CARE_PROVIDER_SITE_OTHER): Payer: Managed Care, Other (non HMO) | Admitting: Family Medicine

## 2011-12-12 DIAGNOSIS — J45901 Unspecified asthma with (acute) exacerbation: Secondary | ICD-10-CM

## 2011-12-12 NOTE — Patient Instructions (Signed)
0.5-teaspoon of the cough syrup at bedtime as needed.  Return p.r.n.

## 2011-12-12 NOTE — Progress Notes (Signed)
  Subjective:    Patient ID: Maria [name], female    DOB: 04/09/1941, 71 y.o.   MRN: [id number]  HPI Maria [name] is a 71 year old female, who comes in today for evaluation of wheezing.  We saw her about 4 weeks ago with a viral syndrome and secondary asthma.  We started her on prednisone and she finished her last half tablet.  This past Friday.  She states the wheezing is gone, but she still coughing a little bit.   Review of Systems    General and pulmonary review of systems otherwise negative Objective:   Physical Exam Well-developed well-nourished, female in no acute distress.  Examination lungs show symmetrical.  Breath sounds.  No wheezing       Assessment & Plan:  Asked for a resolving.  Plan cough syrup 0.5-teaspoon nightly return p.r.n.

## 2012-02-27 ENCOUNTER — Telehealth: Payer: Self-pay | Admitting: Family Medicine

## 2012-02-27 NOTE — Telephone Encounter (Signed)
Dr. Eulah Pont office contacted about surgical clearance. They have a question about stopping use of Asprin before surgery. Please contact Sherry at [phone] ext [phone]

## 2012-02-27 NOTE — Telephone Encounter (Signed)
Dr Tawanna Cooler wrote a note and this was faxed to the surgical center

## 2012-05-14 ENCOUNTER — Encounter (HOSPITAL_COMMUNITY): Payer: Self-pay | Admitting: Pharmacy Technician

## 2012-05-22 ENCOUNTER — Inpatient Hospital Stay (HOSPITAL_COMMUNITY)
Admission: RE | Admit: 2012-05-22 | Discharge: 2012-05-22 | Payer: Managed Care, Other (non HMO) | Source: Ambulatory Visit

## 2012-05-22 ENCOUNTER — Encounter (HOSPITAL_COMMUNITY): Payer: Self-pay

## 2012-05-22 NOTE — Pre-Procedure Instructions (Addendum)
19 Maria [name]  05/22/2012   Your procedure is scheduled on:  Wednesday May 30, 2012.  Report to [facility] at 1030 AM.  Call this number if you have problems the morning of surgery: [phone]   Remember:   Do not eat food or drink:After Midnight.    Take these medicines the morning of surgery with A SIP OF WATER: Atenolol (Tenoretic), and Omeprazole (Prilosec)   Do not wear jewelry, make-up or nail polish.  Do not wear lotions, powders, or perfumes.   Do not shave 48 hours prior to surgery.   Do not bring valuables to the hospital.  Contacts, dentures or bridgework may not be worn into surgery.  Leave suitcase in the car. After surgery it may be brought to your room.  For patients admitted to the hospital, checkout time is 11:00 AM the day of discharge.     :   Special Instructions: CHG Shower Use Special Wash: 1/2 bottle night before surgery and 1/2 bottle morning of surgery.   Please read over the following fact sheets that you were given: Pain Booklet, Coughing and Deep Breathing, Blood Transfusion Information, Total Joint Packet, MRSA Information and Surgical Site Infection Prevention

## 2012-05-22 NOTE — Progress Notes (Signed)
Pt denied having a stress test, cardiac cath, or sleep study. PCP is Dr. Tawanna Cooler. Encounters in [facility].

## 2012-05-24 ENCOUNTER — Encounter (HOSPITAL_COMMUNITY)
Admission: RE | Admit: 2012-05-24 | Discharge: 2012-05-24 | Disposition: A | Discharge status: Home / Self Care | Payer: Managed Care, Other (non HMO) | Source: Ambulatory Visit | Attending: Orthopedic Surgery | Admitting: Orthopedic Surgery

## 2012-05-24 ENCOUNTER — Encounter (HOSPITAL_COMMUNITY): Payer: Self-pay

## 2012-05-24 ENCOUNTER — Encounter (HOSPITAL_COMMUNITY)
Admission: RE | Admit: 2012-05-24 | Discharge: 2012-05-24 | Disposition: A | Discharge status: Home / Self Care | Payer: Managed Care, Other (non HMO) | Source: Ambulatory Visit | Attending: Surgery | Admitting: Surgery

## 2012-05-24 ENCOUNTER — Other Ambulatory Visit (HOSPITAL_COMMUNITY): Payer: Managed Care, Other (non HMO)

## 2012-05-24 HISTORY — DX: Personal history of peptic ulcer disease: Z87.11

## 2012-05-24 HISTORY — DX: Unspecified osteoarthritis, unspecified site: M19.90

## 2012-05-24 HISTORY — DX: Gastro-esophageal reflux disease without esophagitis: K21.9

## 2012-05-24 HISTORY — DX: Bronchitis, not specified as acute or chronic: J40

## 2012-05-24 LAB — URINALYSIS, ROUTINE W REFLEX MICROSCOPIC
Ketones, ur: NEGATIVE mg/dL
Leukocytes, UA: NEGATIVE
Nitrite: NEGATIVE
Protein, ur: NEGATIVE mg/dL
pH: 7 (ref 5.0–8.0)

## 2012-05-24 LAB — CBC
MCV: 81.2 fL (ref 78.0–100.0)
Platelets: 281 10*3/uL (ref 150–400)
RDW: 13.7 % (ref 11.5–15.5)
WBC: 5.9 10*3/uL (ref 4.0–10.5)

## 2012-05-24 LAB — COMPREHENSIVE METABOLIC PANEL
ALT: 10 U/L (ref 0–35)
AST: 17 U/L (ref 0–37)
Albumin: 3.8 g/dL (ref 3.5–5.2)
Chloride: 101 mEq/L (ref 96–112)
Creatinine, Ser: 1.02 mg/dL (ref 0.50–1.10)
Potassium: 4.2 mEq/L (ref 3.5–5.1)
Sodium: 139 mEq/L (ref 135–145)
Total Bilirubin: 0.5 mg/dL (ref 0.3–1.2)

## 2012-05-24 LAB — APTT: aPTT: 36 seconds (ref 24–37)

## 2012-05-24 LAB — TYPE AND SCREEN

## 2012-05-24 LAB — ABO/RH: ABO/RH(D): O POS

## 2012-05-24 LAB — PROTIME-INR: INR: 1.07 (ref 0.00–1.49)

## 2012-05-24 NOTE — Progress Notes (Signed)
Dr. Eulah Pont 's office notified that we need orders for pre-admit visit.

## 2012-05-29 MED ORDER — CEFAZOLIN SODIUM-DEXTROSE 2-3 GM-% IV SOLR
2.0000 g | INTRAVENOUS | Status: AC
  Administered 2012-05-30: 2 g via INTRAVENOUS
  Filled 2012-05-29: qty 50

## 2012-05-29 NOTE — H&P (Signed)
  [facility] [address]   [address] [place], [place] [address] [phone] A Division of [facility]  Loreta Ave, M.D.     Robert A. Thurston Hole, M.D.     Lunette Stands, M.D. Eulas Post, M.D.    Buford Dresser, M.D. Estell Harpin, M.D. Ralene Cork, D.O.          Genene Churn. Barry Dienes, PA-C            Kirstin A. Shepperson, PA-C [place], OPA-C   RE: Maria [name], Rocco                                [id number]      DOB: 1941-04-20 PROGRESS NOTE: 05-22-12 Chief complaint: Right knee pain.  History of present illness: 54 two year-old black female with a history of end stage DJD, right knee, and chronic pain.  Returns.  States that knee symptoms unchanged from previous visit.  She is wanting to proceed with total knee replacement as scheduled.  We have received pre-op medical clearance.  Patient has a history of GI ulcers several years ago and will be needing to use Lovenox only post-op.   Current medications: Chlor-Con, Simvastatin, low-dose Aspirin, Atenolol, Calcium plus Vitamin D.   Allergies: No known drug allergies. Past medical/surgical history: Bleeding ulcers in January of 2002, complete hysterectomy, hypercholesterolemia and hypertension. Review of systems: Patient denies fevers, chills, lightheadedness, dizziness, cardiac, pulmonary, GI, GU or neuro issues. Family history: Negative. Social history: Does not smoke or drink.  Patient lives alone.       EXAMINATION: Pleasant, elderly, black female, alert and oriented x 3 and in no acute distress.  Height: 5?0.  Weight: 145 pounds.  Respirations: 12.  Blood pressure: 130/72.  Pulse: 76.  Temperature: 98.8.  Head is normocephalic, a traumatic.  PERRLA, EOMI.  Cervical spine unremarkable.  Lungs: CTA bilaterally.  No wheezes.  Heart: RRR.  S1 and S2.  No murmurs.  Abdomen: Flat and non-distended.  NBS x 4.  Soft and non-tender.  Right knee: Decreased range of  motion.  Positive crepitus.  Joint line tender.  Positive effusion.  Ligaments stable.  Calf non-tender.  Neurovascularly intact.  Skin warm and dry.     IMPRESSION: End stage DJD, right knee, and chronic pain.  Failed conservative treatment.    PLAN: We will proceed with right total knee replacement as scheduled.  Surgical procedure, along with potential rehab/recovery time discussed.  All questions answered.  She will need rehab placement post-op.    Genene Churn. Barry Dienes, PA-C   Electronically verified by Loreta Ave, M.D. JMO:jjh D 05-25-12 T 05-28-12

## 2012-05-29 NOTE — Progress Notes (Signed)
Patient called and informed of time change

## 2012-05-30 ENCOUNTER — Encounter (HOSPITAL_COMMUNITY): Payer: Self-pay | Admitting: *Deleted

## 2012-05-30 ENCOUNTER — Encounter (HOSPITAL_COMMUNITY)
Admission: RE | Disposition: A | Discharge status: Skilled Nursing Facility | Payer: Self-pay | Source: Ambulatory Visit | Attending: Orthopedic Surgery

## 2012-05-30 ENCOUNTER — Encounter (HOSPITAL_COMMUNITY): Payer: Self-pay | Admitting: Anesthesiology

## 2012-05-30 ENCOUNTER — Inpatient Hospital Stay (HOSPITAL_COMMUNITY): Payer: Managed Care, Other (non HMO)

## 2012-05-30 ENCOUNTER — Inpatient Hospital Stay (HOSPITAL_COMMUNITY)
Admission: RE | Admit: 2012-05-30 | Discharge: 2012-06-05 | DRG: 470 | Disposition: A | Discharge status: Skilled Nursing Facility | Payer: Managed Care, Other (non HMO) | Source: Ambulatory Visit | Attending: Orthopedic Surgery | Admitting: Orthopedic Surgery

## 2012-05-30 ENCOUNTER — Ambulatory Visit (HOSPITAL_COMMUNITY): Payer: Managed Care, Other (non HMO) | Admitting: Anesthesiology

## 2012-05-30 DIAGNOSIS — M1710 Unilateral primary osteoarthritis, unspecified knee: Principal | ICD-10-CM | POA: Diagnosis present

## 2012-05-30 DIAGNOSIS — Z01812 Encounter for preprocedural laboratory examination: Secondary | ICD-10-CM

## 2012-05-30 DIAGNOSIS — Z8711 Personal history of peptic ulcer disease: Secondary | ICD-10-CM

## 2012-05-30 DIAGNOSIS — I10 Essential (primary) hypertension: Secondary | ICD-10-CM | POA: Diagnosis present

## 2012-05-30 DIAGNOSIS — M1711 Unilateral primary osteoarthritis, right knee: Secondary | ICD-10-CM | POA: Diagnosis present

## 2012-05-30 DIAGNOSIS — G8929 Other chronic pain: Secondary | ICD-10-CM | POA: Diagnosis present

## 2012-05-30 DIAGNOSIS — Z7982 Long term (current) use of aspirin: Secondary | ICD-10-CM

## 2012-05-30 DIAGNOSIS — Z01818 Encounter for other preprocedural examination: Secondary | ICD-10-CM

## 2012-05-30 DIAGNOSIS — D62 Acute posthemorrhagic anemia: Secondary | ICD-10-CM | POA: Diagnosis not present

## 2012-05-30 DIAGNOSIS — Z471 Aftercare following joint replacement surgery: Secondary | ICD-10-CM

## 2012-05-30 DIAGNOSIS — K219 Gastro-esophageal reflux disease without esophagitis: Secondary | ICD-10-CM | POA: Diagnosis present

## 2012-05-30 DIAGNOSIS — Z79899 Other long term (current) drug therapy: Secondary | ICD-10-CM

## 2012-05-30 DIAGNOSIS — E876 Hypokalemia: Secondary | ICD-10-CM | POA: Diagnosis not present

## 2012-05-30 DIAGNOSIS — E7800 Pure hypercholesterolemia, unspecified: Secondary | ICD-10-CM | POA: Diagnosis present

## 2012-05-30 HISTORY — DX: Unilateral primary osteoarthritis, right knee: M17.11

## 2012-05-30 HISTORY — PX: TOTAL KNEE ARTHROPLASTY: SHX125

## 2012-05-30 SURGERY — ARTHROPLASTY, KNEE, TOTAL
Anesthesia: General | Site: Knee | Laterality: Right | Wound class: Clean

## 2012-05-30 MED ORDER — CEFAZOLIN SODIUM 1-5 GM-% IV SOLN
1.0000 g | Freq: Three times a day (TID) | INTRAVENOUS | Status: AC
  Administered 2012-05-30 – 2012-05-31 (×3): 1 g via INTRAVENOUS
  Filled 2012-05-30 (×3): qty 50

## 2012-05-30 MED ORDER — MIDAZOLAM HCL 2 MG/2ML IJ SOLN
INTRAMUSCULAR | Status: AC
  Filled 2012-05-30: qty 2

## 2012-05-30 MED ORDER — BUPIVACAINE HCL (PF) 0.25 % IJ SOLN
INTRAMUSCULAR | Status: DC | PRN
  Administered 2012-05-30: 30 mL via INTRA_ARTICULAR

## 2012-05-30 MED ORDER — METHOCARBAMOL 100 MG/ML IJ SOLN
500.0000 mg | Freq: Four times a day (QID) | INTRAVENOUS | Status: DC | PRN
  Administered 2012-05-30: 500 mg via INTRAVENOUS
  Filled 2012-05-30: qty 5

## 2012-05-30 MED ORDER — METOCLOPRAMIDE HCL 5 MG/ML IJ SOLN: 5.0000 mg | Freq: Three times a day (TID) | INTRAMUSCULAR | Status: DC | PRN

## 2012-05-30 MED ORDER — MORPHINE SULFATE 4 MG/ML IJ SOLN
INTRAMUSCULAR | Status: DC | PRN
  Administered 2012-05-30: 4 mg

## 2012-05-30 MED ORDER — FENTANYL CITRATE 0.05 MG/ML IJ SOLN
50.0000 ug | INTRAMUSCULAR | Status: DC | PRN
  Administered 2012-05-30: 100 ug via INTRAVENOUS

## 2012-05-30 MED ORDER — MENTHOL 3 MG MT LOZG: 1.0000 | LOZENGE | OROMUCOSAL | Status: DC | PRN

## 2012-05-30 MED ORDER — POTASSIUM CHLORIDE IN NACL 20-0.9 MEQ/L-% IV SOLN
INTRAVENOUS | Status: DC
  Administered 2012-05-30 – 2012-05-31 (×2): via INTRAVENOUS
  Filled 2012-05-30 (×14): qty 1000

## 2012-05-30 MED ORDER — HYDROMORPHONE HCL PF 1 MG/ML IJ SOLN
INTRAMUSCULAR | Status: AC
  Filled 2012-05-30: qty 1

## 2012-05-30 MED ORDER — PROPOFOL 10 MG/ML IV EMUL
INTRAVENOUS | Status: DC | PRN
  Administered 2012-05-30: 160 mg via INTRAVENOUS

## 2012-05-30 MED ORDER — SODIUM CHLORIDE 0.9 % IR SOLN
Status: DC | PRN
  Administered 2012-05-30: 3000 mL

## 2012-05-30 MED ORDER — FENTANYL CITRATE 0.05 MG/ML IJ SOLN
INTRAMUSCULAR | Status: DC | PRN
  Administered 2012-05-30 (×5): 50 ug via INTRAVENOUS

## 2012-05-30 MED ORDER — ACETAMINOPHEN 10 MG/ML IV SOLN
INTRAVENOUS | Status: AC
  Filled 2012-05-30: qty 100

## 2012-05-30 MED ORDER — ENOXAPARIN SODIUM 30 MG/0.3ML ~~LOC~~ SOLN
30.0000 mg | Freq: Two times a day (BID) | SUBCUTANEOUS | Status: DC
  Administered 2012-05-31 – 2012-06-05 (×11): 30 mg via SUBCUTANEOUS
  Filled 2012-05-30 (×13): qty 0.3

## 2012-05-30 MED ORDER — SENNOSIDES-DOCUSATE SODIUM 8.6-50 MG PO TABS
1.0000 | ORAL_TABLET | Freq: Every evening | ORAL | Status: DC | PRN
  Administered 2012-06-02 – 2012-06-03 (×2): 1 via ORAL
  Filled 2012-05-30 (×2): qty 1

## 2012-05-30 MED ORDER — BUPIVACAINE-EPINEPHRINE PF 0.5-1:200000 % IJ SOLN
INTRAMUSCULAR | Status: DC | PRN
  Administered 2012-05-30: 30 mL

## 2012-05-30 MED ORDER — SIMVASTATIN 20 MG PO TABS
20.0000 mg | ORAL_TABLET | ORAL | Status: DC
  Administered 2012-05-31 – 2012-06-04 (×3): 20 mg via ORAL
  Filled 2012-05-30 (×3): qty 1

## 2012-05-30 MED ORDER — ONDANSETRON HCL 4 MG PO TABS: 4.0000 mg | ORAL_TABLET | Freq: Four times a day (QID) | ORAL | Status: DC | PRN

## 2012-05-30 MED ORDER — METHOCARBAMOL 500 MG PO TABS
500.0000 mg | ORAL_TABLET | Freq: Four times a day (QID) | ORAL | Status: DC | PRN
  Administered 2012-05-31 – 2012-06-04 (×9): 500 mg via ORAL
  Filled 2012-05-30 (×10): qty 1

## 2012-05-30 MED ORDER — ACETAMINOPHEN 10 MG/ML IV SOLN
INTRAVENOUS | Status: DC | PRN
  Administered 2012-05-30: 1000 mg via INTRAVENOUS

## 2012-05-30 MED ORDER — MIDAZOLAM HCL 2 MG/2ML IJ SOLN
0.5000 mg | INTRAMUSCULAR | Status: DC | PRN
  Administered 2012-05-30: 1 mg via INTRAVENOUS

## 2012-05-30 MED ORDER — ONDANSETRON HCL 4 MG/2ML IJ SOLN: 4.0000 mg | Freq: Four times a day (QID) | INTRAMUSCULAR | Status: DC | PRN

## 2012-05-30 MED ORDER — LACTATED RINGERS IV SOLN
INTRAVENOUS | Status: DC
  Administered 2012-05-30 (×2): via INTRAVENOUS

## 2012-05-30 MED ORDER — FENTANYL CITRATE 0.05 MG/ML IJ SOLN
INTRAMUSCULAR | Status: AC
  Filled 2012-05-30: qty 2

## 2012-05-30 MED ORDER — ATENOLOL 25 MG PO TABS
25.0000 mg | ORAL_TABLET | Freq: Every day | ORAL | Status: DC
  Administered 2012-06-01 – 2012-06-05 (×5): 25 mg via ORAL
  Filled 2012-05-30 (×6): qty 1

## 2012-05-30 MED ORDER — ACETAMINOPHEN 650 MG RE SUPP: 650.0000 mg | Freq: Four times a day (QID) | RECTAL | Status: DC | PRN

## 2012-05-30 MED ORDER — PANTOPRAZOLE SODIUM 40 MG PO TBEC
40.0000 mg | DELAYED_RELEASE_TABLET | Freq: Every day | ORAL | Status: DC
  Administered 2012-05-31 – 2012-06-05 (×6): 40 mg via ORAL
  Filled 2012-05-30 (×5): qty 1

## 2012-05-30 MED ORDER — HYDROCODONE-ACETAMINOPHEN 7.5-325 MG PO TABS
1.0000 | ORAL_TABLET | ORAL | Status: DC | PRN
  Administered 2012-05-30 – 2012-05-31 (×5): 1 via ORAL
  Administered 2012-06-01 – 2012-06-05 (×12): 2 via ORAL
  Filled 2012-05-30 (×8): qty 2
  Filled 2012-05-30: qty 1
  Filled 2012-05-30 (×5): qty 2
  Filled 2012-05-30: qty 1
  Filled 2012-05-30: qty 2

## 2012-05-30 MED ORDER — HYDROMORPHONE HCL PF 1 MG/ML IJ SOLN
0.5000 mg | INTRAMUSCULAR | Status: DC | PRN
  Filled 2012-05-30: qty 1

## 2012-05-30 MED ORDER — ACETAMINOPHEN 325 MG PO TABS
650.0000 mg | ORAL_TABLET | Freq: Four times a day (QID) | ORAL | Status: DC | PRN
  Administered 2012-06-03: 325 mg via ORAL
  Filled 2012-05-30: qty 1

## 2012-05-30 MED ORDER — ATENOLOL-CHLORTHALIDONE 50-25 MG PO TABS: 0.5000 | ORAL_TABLET | Freq: Every day | ORAL | Status: DC

## 2012-05-30 MED ORDER — HYDROMORPHONE HCL PF 1 MG/ML IJ SOLN
0.2500 mg | INTRAMUSCULAR | Status: DC | PRN
  Administered 2012-05-30 (×2): 0.5 mg via INTRAVENOUS

## 2012-05-30 MED ORDER — METOCLOPRAMIDE HCL 10 MG PO TABS: 5.0000 mg | ORAL_TABLET | Freq: Three times a day (TID) | ORAL | Status: DC | PRN

## 2012-05-30 MED ORDER — PHENOL 1.4 % MT LIQD: 1.0000 | OROMUCOSAL | Status: DC | PRN

## 2012-05-30 MED ORDER — LIDOCAINE HCL (CARDIAC) 20 MG/ML IV SOLN
INTRAVENOUS | Status: DC | PRN
  Administered 2012-05-30: 60 mg via INTRAVENOUS

## 2012-05-30 MED ORDER — BUPIVACAINE HCL (PF) 0.25 % IJ SOLN
INTRAMUSCULAR | Status: AC
  Filled 2012-05-30: qty 30

## 2012-05-30 MED ORDER — CHLORTHALIDONE 25 MG PO TABS
12.5000 mg | ORAL_TABLET | Freq: Every day | ORAL | Status: DC
  Administered 2012-06-01 – 2012-06-04 (×4): 12.5 mg via ORAL
  Administered 2012-06-05: 11:00:00 via ORAL
  Filled 2012-05-30 (×6): qty 0.5

## 2012-05-30 MED ORDER — LACTATED RINGERS IV SOLN
INTRAVENOUS | Status: DC | PRN
  Administered 2012-05-30 (×2): via INTRAVENOUS

## 2012-05-30 SURGICAL SUPPLY — 56 items
BANDAGE ESMARK 6X9 LF (GAUZE/BANDAGES/DRESSINGS) ×1 IMPLANT
BLADE SAG 18X100X1.27 (BLADE) ×4 IMPLANT
BNDG CMPR 9X6 STRL LF SNTH (GAUZE/BANDAGES/DRESSINGS) ×1
BNDG ESMARK 6X9 LF (GAUZE/BANDAGES/DRESSINGS) ×2
BOOTCOVER CLEANROOM LRG (PROTECTIVE WEAR) ×4 IMPLANT
BOWL SMART MIX CTS (DISPOSABLE) ×2 IMPLANT
CEMENT BONE SIMPLEX SPEEDSET (Cement) ×4 IMPLANT
CLOTH BEACON ORANGE TIMEOUT ST (SAFETY) ×2 IMPLANT
COVER BACK TABLE 24X17X13 BIG (DRAPES) ×2 IMPLANT
COVER SURGICAL LIGHT HANDLE (MISCELLANEOUS) ×2 IMPLANT
CUFF TOURNIQUET SINGLE 34IN LL (TOURNIQUET CUFF) ×2 IMPLANT
DRAPE EXTREMITY T 121X128X90 (DRAPE) ×2 IMPLANT
DRAPE PROXIMA HALF (DRAPES) ×2 IMPLANT
DRAPE U-SHAPE 47X51 STRL (DRAPES) ×2 IMPLANT
DRSG PAD ABDOMINAL 8X10 ST (GAUZE/BANDAGES/DRESSINGS) ×2 IMPLANT
DURAPREP 26ML APPLICATOR (WOUND CARE) ×2 IMPLANT
ELECT CAUTERY BLADE 6.4 (BLADE) ×2 IMPLANT
ELECT REM PT RETURN 9FT ADLT (ELECTROSURGICAL) ×2
ELECTRODE REM PT RTRN 9FT ADLT (ELECTROSURGICAL) ×1 IMPLANT
EVACUATOR 1/8 PVC DRAIN (DRAIN) ×2 IMPLANT
FACESHIELD LNG OPTICON STERILE (SAFETY) ×2 IMPLANT
GAUZE XEROFORM 5X9 LF (GAUZE/BANDAGES/DRESSINGS) ×2 IMPLANT
GLOVE BIOGEL PI IND STRL 8 (GLOVE) ×1 IMPLANT
GLOVE BIOGEL PI INDICATOR 8 (GLOVE) ×1
GLOVE ORTHO TXT STRL SZ7.5 (GLOVE) ×2 IMPLANT
GOWN PREVENTION PLUS XLARGE (GOWN DISPOSABLE) ×4 IMPLANT
GOWN STRL NON-REIN LRG LVL3 (GOWN DISPOSABLE) ×4 IMPLANT
GOWN STRL REIN 2XL XLG LVL4 (GOWN DISPOSABLE) ×2 IMPLANT
HANDPIECE INTERPULSE COAX TIP (DISPOSABLE) ×2
IMMOBILIZER KNEE 22 UNIV (SOFTGOODS) ×2 IMPLANT
IMMOBILIZER KNEE 24 THIGH 36 (MISCELLANEOUS) IMPLANT
IMMOBILIZER KNEE 24 UNIV (MISCELLANEOUS)
KIT BASIN OR (CUSTOM PROCEDURE TRAY) ×2 IMPLANT
KIT ROOM TURNOVER OR (KITS) ×2 IMPLANT
MANIFOLD NEPTUNE II (INSTRUMENTS) ×2 IMPLANT
NS IRRIG 1000ML POUR BTL (IV SOLUTION) ×2 IMPLANT
PACK TOTAL JOINT (CUSTOM PROCEDURE TRAY) ×2 IMPLANT
PAD ARMBOARD 7.5X6 YLW CONV (MISCELLANEOUS) ×4 IMPLANT
PAD CAST 4YDX4 CTTN HI CHSV (CAST SUPPLIES) ×1 IMPLANT
PADDING CAST COTTON 4X4 STRL (CAST SUPPLIES) ×2
PADDING CAST COTTON 6X4 STRL (CAST SUPPLIES) ×2 IMPLANT
RUBBERBAND STERILE (MISCELLANEOUS) ×2 IMPLANT
SET HNDPC FAN SPRY TIP SCT (DISPOSABLE) ×1 IMPLANT
SPONGE GAUZE 4X4 12PLY (GAUZE/BANDAGES/DRESSINGS) ×2 IMPLANT
STAPLER VISISTAT 35W (STAPLE) ×2 IMPLANT
SUCTION FRAZIER TIP 10 FR DISP (SUCTIONS) ×2 IMPLANT
SUT VIC AB 1 CTX 36 (SUTURE) ×4
SUT VIC AB 1 CTX36XBRD ANBCTR (SUTURE) ×2 IMPLANT
SUT VIC AB 2-0 CT1 27 (SUTURE) ×4
SUT VIC AB 2-0 CT1 TAPERPNT 27 (SUTURE) ×2 IMPLANT
SYR 30ML LL (SYRINGE) ×2 IMPLANT
SYR 30ML SLIP (SYRINGE) ×2 IMPLANT
TOWEL OR 17X24 6PK STRL BLUE (TOWEL DISPOSABLE) ×2 IMPLANT
TOWEL OR 17X26 10 PK STRL BLUE (TOWEL DISPOSABLE) ×2 IMPLANT
TRAY FOLEY CATH 14FR (SET/KITS/TRAYS/PACK) ×2 IMPLANT
WATER STERILE IRR 1000ML POUR (IV SOLUTION) ×6 IMPLANT

## 2012-05-30 NOTE — Anesthesia Procedure Notes (Signed)
Anesthesia Regional Block:  Femoral nerve block  Pre-Anesthetic Checklist: ,, timeout performed, Correct Patient, Correct Site, Correct Laterality, Correct Procedure, Correct Position, site marked, Risks and benefits discussed, pre-op evaluation,  At surgeon's request and post-op pain management  Laterality: Right  Prep: Maximum Sterile Barrier Precautions used and chloraprep       Needles:  Injection technique: Single-shot  Needle Type: Echogenic Stimulator Needle      Needle Gauge: 22 and 22 G    Additional Needles:  Procedures: ultrasound guided and nerve stimulator Femoral nerve block  Nerve Stimulator or Paresthesia:  Response: Patellar respose, 0.4 mA,   Additional Responses:   Narrative:  Start time: 05/30/2012 10:18 AM End time: 05/30/2012 10:34 AM Injection made incrementally with aspirations every 5 mL. Anesthesiologist: Ammon Muscatello,MD  Additional Notes: 2% Lidocaine skin wheel.   Femoral nerve block

## 2012-05-30 NOTE — Progress Notes (Signed)
Orthopedic Tech Progress Note Patient Details:  Maria [name] 12-27-1940 [id number]  CPM Right Knee CPM Right Knee: On Right Knee Flexion (Degrees): 60  Right Knee Extension (Degrees): 0  Frame will be applied at first available.  Samya Siciliano T 05/30/2012, 1:55 PM

## 2012-05-30 NOTE — Op Note (Signed)
NAMENAYARA, TAPLIN NO.:  [id number]  MEDICAL RECORD NO.:  [id number]  LOCATION:  5N21C                        FACILITY:  [facility]  PHYSICIAN:  Loreta Ave, M.D. DATE OF BIRTH:  03-16-1941  DATE OF PROCEDURE:  05/30/2012 DATE OF DISCHARGE:                              OPERATIVE REPORT   PREOPERATIVE DIAGNOSIS:  Right knee end-stage degenerative arthritis, varus alignment.  POSTOPERATIVE DIAGNOSIS:  Right knee end-stage degenerative arthritis, varus alignment.  PROCEDURE:  Modified minimally invasive right total knee replacement, Stryker Triathlon prosthesis.  Soft tissue balancing.  Cemented pegged posterior stabilized #3 femoral component.  Cemented #3 tibial component, 11 mm polyethylene insert.  Cemented resurfacing 32 mm patellar component.  SURGEON:  Loreta Ave, M.D.  ASSISTANT:  Genene Churn. Denton Meek., who presents throughout the entire case necessary for timely completion of procedure.  ANESTHESIA:  General.  BLOOD LOSS:  Minimal.  SPECIMENS:  None.  COMPLICATION:  None.  DRESSINGS:  Soft compressive knee immobilizer.  TOURNIQUET TIME:  1 hour.  DRAINS:  Hemovac x1.  PROCEDURE:  The patient was brought to the operating room, placed on the operating table in supine position.  After adequate anesthesia had been obtained, knee examined.  Fairly good extension flexion 90 degrees, varus alignment partially correctable.  Straight incision above the patella down to tibial tubercle.  Skin subcutaneous tissue divided. Medial arthrotomy, vastus splitting, preserving quad tendon.  Knee exposed.  Medial capsule release.  Remnants of menisci, cruciate ligaments, loose body spurs removed.  Relatively large loose body, posterior aspect of the knee was found and removed.  Distal femur exposed.  Intramedullary guide placed.  An 8 mm resection, 5 degrees of valgus.  Using epicondylar axis, the femur was sized, cut, and fitted for a posterior  stabilized #3 femoral component.  Attention turned to the tibia.  Extramedullary guide placed.  Resection below the defect medially.  Sized #3 component.  Debris cleared throughout the knee anterior and posterior.  Patella exposed, posterior 10 mm removed. Drilled, sized, and fitted for a 32 mm component.  Trials put in place throughout.  Number 3 on the femur number 3 on the tibia, 11 mm insert and a 32 component on the patella.  With this construct, I had nicely balanced knee flexion extension.  Full motion.  Excellent patellofemoral tracking good by mechanical axis.  Tibia was marked for rotation and reamed.  All trials removed.  Copious irrigation with a pulse irrigating device.  Cement placed on all components, firmly seated.  Polyethylene attached to tibia knee reduced.  Patella with a clamp.  Once cement hardened, the knee was reexamined.  Again, pleased with motion alignment stability.  Hemovac was placed and brought through a separate stab wound.  Arthrotomy closed with #1 Vicryl, skin with subcutaneous tissue with Vicryl staples.  Sterile compressive dressing applied.  Tourniquet deflated removed.  Knee immobilizer applied.  Anesthesia reversed. Brought to the recovery room.  Tolerated surgery well.  No complications.     Loreta Ave, M.D.     DFM/MEDQ  D:  05/30/2012  T:  05/30/2012  Job:  [id number]

## 2012-05-30 NOTE — Brief Op Note (Signed)
05/30/2012  12:57 PM  PATIENT:  Maria [name]  71 y.o. female  PRE-OPERATIVE DIAGNOSIS:  DJD RIGHT KNEE  POST-OPERATIVE DIAGNOSIS:  DJD RIGHT KNEE  PROCEDURE:  Procedure(s) (LRB): TOTAL KNEE ARTHROPLASTY (Right)  SURGEON:  Surgeon(s) and Role:    * Loreta Ave, MD - Primary  PHYSICIAN ASSISTANT: Jaekwon Mcclune M   ANESTHESIA:   regional and general  EBL:  Total I/O In: 1000 [I.V.:1000] Out: 100 [Urine:100]  BLOOD ADMINISTERED:none  SPECIMEN:  No Specimen  DISPOSITION OF SPECIMEN:  N/A  COUNTS:  YES  TOURNIQUET:   Total Tourniquet Time Documented: Thigh (Right) - 0 minutes  PATIENT DISPOSITION:  PACU - hemodynamically stable.   D

## 2012-05-30 NOTE — Preoperative (Signed)
Beta Blockers   Reason not to administer Beta Blockers:B blocker taken 05/30/12 @ 0700

## 2012-05-30 NOTE — Anesthesia Postprocedure Evaluation (Signed)
  Anesthesia Post-op Note  Patient: Maria [name]  Procedure(s) Performed: Procedure(s) (LRB): TOTAL KNEE ARTHROPLASTY (Right)  Patient Location: PACU  Anesthesia Type: GA combined with regional for post-op pain  Level of Consciousness: awake  Airway and Oxygen Therapy: Patient Spontanous Breathing and Patient connected to nasal cannula oxygen  Post-op Pain: mild  Post-op Assessment: Post-op Vital signs reviewed, Patient's Cardiovascular Status Stable, Respiratory Function Stable, Patent Airway and No signs of Nausea or vomiting  Post-op Vital Signs: Reviewed and stable  Complications: No apparent anesthesia complications

## 2012-05-30 NOTE — Anesthesia Preprocedure Evaluation (Signed)
Anesthesia Evaluation  Patient identified by MRN, date of birth, ID band Patient awake    Reviewed: Allergy & Precautions, H&P , NPO status , Patient's Chart, lab work & pertinent test results, reviewed documented beta blocker date and time   Airway Mallampati: II TM Distance: >3 FB Neck ROM: Full    Dental No notable dental hx. (+) Edentulous Upper, Partial Lower and Dental Advisory Given   Pulmonary neg pulmonary ROS,  breath sounds clear to auscultation  Pulmonary exam normal       Cardiovascular hypertension, On Medications and On Home Beta Blockers Rhythm:Regular Rate:Normal     Neuro/Psych negative neurological ROS  negative psych ROS   GI/Hepatic Neg liver ROS, GERD-  Medicated and Controlled,  Endo/Other  negative endocrine ROS  Renal/GU negative Renal ROS  negative genitourinary   Musculoskeletal   Abdominal   Peds  Hematology negative hematology ROS (+)   Anesthesia Other Findings   Reproductive/Obstetrics negative OB ROS                           Anesthesia Physical Anesthesia Plan  ASA: II  Anesthesia Plan: General   Post-op Pain Management:    Induction: Intravenous  Airway Management Planned: LMA  Additional Equipment:   Intra-op Plan:   Post-operative Plan: Extubation in OR  Informed Consent: I have reviewed the patients History and Physical, chart, labs and discussed the procedure including the risks, benefits and alternatives for the proposed anesthesia with the patient or authorized representative who has indicated his/her understanding and acceptance.   Dental advisory given  Plan Discussed with: CRNA  Anesthesia Plan Comments:         Anesthesia Quick Evaluation

## 2012-05-30 NOTE — Transfer of Care (Signed)
Immediate Anesthesia Transfer of Care Note  Patient: Maria [name]  Procedure(s) Performed: Procedure(s) (LRB): TOTAL KNEE ARTHROPLASTY (Right)  Patient Location: PACU  Anesthesia Type: General  Level of Consciousness: awake, alert  and oriented  Airway & Oxygen Therapy: Patient Spontanous Breathing and Patient connected to nasal cannula oxygen  Post-op Assessment: Report given to PACU RN, Post -op Vital signs reviewed and stable and Patient moving all extremities  Post vital signs: Reviewed and stable  Complications: No apparent anesthesia complications

## 2012-05-30 NOTE — Interval H&P Note (Signed)
History and Physical Interval Note:  05/30/2012 8:27 AM  Maria [name]  has presented today for surgery, with the diagnosis of DJD RIGHT KNEE  The various methods of treatment have been discussed with the patient and family. After consideration of risks, benefits and other options for treatment, the patient has consented to  Procedure(s) (LRB): TOTAL KNEE ARTHROPLASTY (Right) as a surgical intervention .  The patient's history has been reviewed, patient examined, no change in status, stable for surgery.  I have reviewed the patients' chart and labs.  Questions were answered to the patient's satisfaction.     Dequan Kindred F

## 2012-05-31 ENCOUNTER — Encounter (HOSPITAL_COMMUNITY): Payer: Self-pay | Admitting: Orthopedic Surgery

## 2012-05-31 LAB — BASIC METABOLIC PANEL
CO2: 28 mEq/L (ref 19–32)
Glucose, Bld: 142 mg/dL — ABNORMAL HIGH (ref 70–99)
Potassium: 3.5 mEq/L (ref 3.5–5.1)
Sodium: 138 mEq/L (ref 135–145)

## 2012-05-31 LAB — CBC
Hemoglobin: 9.9 g/dL — ABNORMAL LOW (ref 12.0–15.0)
RBC: 3.58 MIL/uL — ABNORMAL LOW (ref 3.87–5.11)
WBC: 5.5 10*3/uL (ref 4.0–10.5)

## 2012-05-31 NOTE — Progress Notes (Signed)
Orthopedic Tech Progress Note Patient Details:  Maria [name] [birth year] [id number]  Patient ID: [name], female   DOB: [birth year], 71 y.o.   MRN: [id number] Confirmed patient has knee immobilizer.  [name] T 05/31/2012, 10:08 AM

## 2012-05-31 NOTE — Progress Notes (Signed)
CARE MANAGEMENT NOTE 05/31/2012  Patient:  Maria [name], Maria [name]   Account Number:  [id number]  Date Initiated:  05/31/2012  Documentation initiated by:  Vance Peper  Subjective/Objective Assessment:   71 yr old female s/p right total knee arthroplasty     Action/Plan:   Patient will need short term rehab at SNF, social worker is aware.   Anticipated DC Date:  06/02/2012   Anticipated DC Plan:  SKILLED NURSING FACILITY  In-house referral  Clinical Social Worker         Choice offered to / List presented to:             Status of service:  Completed, signed off Medicare Important Message given?   (If response is "NO", the following Medicare IM given date fields will be blank) Date Medicare IM given:   Date Additional Medicare IM given:    Discharge Disposition:  SKILLED NURSING FACILITY  Per UR Regulation:    If discussed at Long Length of Stay Meetings, dates discussed:    Comments:

## 2012-05-31 NOTE — Evaluation (Signed)
Physical Therapy Evaluation Patient Details Name: Maria [name] MRN: [id number] DOB: Dec 28, 1940 Today's Date: 05/31/2012 Time: 1131-1200 PT Time Calculation (min): 29 min  PT Assessment / Plan / Recommendation Clinical Impression  pt presents s/p R TKA.  pt moving slowly and needs max encouragement.  pt plans on D/C to SNF.      PT Assessment  Patient needs continued PT services    Follow Up Recommendations  Skilled nursing facility    Barriers to Discharge None      Equipment Recommendations  Defer to next venue    Recommendations for Other Services     Frequency 7X/week    Precautions / Restrictions Precautions Precautions: Knee Required Braces or Orthoses: Knee Immobilizer - Right Knee Immobilizer - Right: On except when in CPM Restrictions Weight Bearing Restrictions: Yes RLE Weight Bearing: Weight bearing as tolerated   Pertinent Vitals/Pain 7/10 pt premedicated.        Mobility  Bed Mobility Bed Mobility: Supine to Sit;Sitting - Scoot to Edge of Bed Supine to Sit: 1: +2 Total assist Supine to Sit: Patient Percentage: 50% Sitting - Scoot to Edge of Bed: 3: Mod assist Details for Bed Mobility Assistance: cues for safe technique, encouragement Transfers Transfers: Sit to Stand;Stand to Sit Sit to Stand: 1: +2 Total assist;With upper extremity assist;From bed Sit to Stand: Patient Percentage: 60% Stand to Sit: 1: +2 Total assist;With upper extremity assist;With armrests;To chair/3-in-1 Stand to Sit: Patient Percentage: 70% Details for Transfer Assistance: cues for use of UEs, positioning of LEs, encouragement.   Ambulation/Gait Ambulation/Gait Assistance: 4: Min assist Ambulation Distance (Feet): 8 Feet Assistive device: Rolling walker Ambulation/Gait Assistance Details: cues for sequencing, upright posture, use of RW, encouragement.   Gait Pattern: Step-to pattern;Decreased step length - left;Decreased stance time - right;Trunk flexed Stairs:  No Wheelchair Mobility Wheelchair Mobility: No    Exercises Total Joint Exercises Ankle Circles/Pumps: AROM;Both;10 reps Quad Sets: AROM;Both;10 reps Long Arc Quad: AAROM;Right;5 reps Knee Flexion: AAROM;Right;5 reps Goniometric ROM: ~10-40   PT Diagnosis: Abnormality of gait;Acute pain  PT Problem List: Decreased strength;Decreased range of motion;Decreased activity tolerance;Decreased balance;Decreased mobility;Decreased knowledge of use of DME;Pain PT Treatment Interventions: DME instruction;Gait training;Functional mobility training;Therapeutic activities;Therapeutic exercise;Balance training;Patient/family education   PT Goals Acute Rehab PT Goals PT Goal Formulation: With patient Time For Goal Achievement: 06/07/12 Potential to Achieve Goals: Good Pt will go Supine/Side to Sit: with supervision PT Goal: Supine/Side to Sit - Progress: Goal set today Pt will go Sit to Supine/Side: with supervision PT Goal: Sit to Supine/Side - Progress: Goal set today Pt will go Sit to Stand: with supervision PT Goal: Sit to Stand - Progress: Goal set today Pt will go Stand to Sit: with supervision PT Goal: Stand to Sit - Progress: Goal set today Pt will Ambulate: 51 - 150 feet;with supervision;with rolling walker PT Goal: Ambulate - Progress: Goal set today Pt will Perform Home Exercise Program: with min assist PT Goal: Perform Home Exercise Program - Progress: Goal set today  Visit Information  Last PT Received On: 05/31/12 Assistance Needed: +2    Subjective Data  Subjective: Oh Jesus I know I need to do this.   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  Very limited by pain.   RLE Sensation: WFL - Light Touch Left Lower Extremity Assessment LLE ROM/Strength/Tone: WFL for tasks assessed LLE Sensation: WFL - Light Touch   Balance Balance Balance Assessed: No  End of Session PT - End of Session Equipment Utilized During Treatment: Gait belt;Right knee immobilizer Activity Tolerance: Patient limited by pain Patient left: in chair;with call bell/phone within reach;with family/visitor present Nurse Communication: Mobility status CPM Right Knee CPM Right Knee: Off  GP     Sunny Schlein, [place] [phone] 05/31/2012, 12:58 PM

## 2012-05-31 NOTE — Clinical Social Work Psychosocial (Addendum)
     Clinical Social Work Department BRIEF PSYCHOSOCIAL ASSESSMENT 05/31/2012  Patient:  Maria, [name]     Account Number:  [id number]     Admit date:  05/30/2012  Clinical Social Worker:  Burnard Hawthorne  Date/Time:  05/31/2012 01:37 PM  Referred by:  Physician  Date Referred:  05/31/2012 Referred for  SNF Placement   Other Referral:   Interview type:  Patient Other interview type:   Patient's Cousin- Maria [name] was also in the room during interview- patient gave permission to discuss with cousin.    PSYCHOSOCIAL DATA Living Status:  ALONE Admitted from facility:   Level of care:   Primary support name:  Maria [name] Primary support relationship to patient:  FAMILY Degree of support available:   Very supportive and involved family    CURRENT CONCERNS Current Concerns  Post-Acute Placement   Other Concerns:    SOCIAL WORK ASSESSMENT / PLAN CSW referred to assist patient with short term SNF.  She lives alone and states that she does not have anyone to help her at home. Patient is currently employed and works at [employer] She has commercial [employer]. Thus, will require extended bed search to determine which facilities accept.   Assessment/plan status:  Psychosocial Support/Ongoing Assessment of Needs Other assessment/ plan:   Information/referral to community resources:   SNF bed list given to patient and discussed.    Discussed aftercare needs from SNF which will arrange for HH/DME if indicated.    PATIENTS/FAMILYS RESPONSE TO PLAN OF CARE: Patient is alert, oriented and very pleasant. She has a positive attitude about her SNF stay.

## 2012-05-31 NOTE — Progress Notes (Signed)
UR COMPLETED  

## 2012-05-31 NOTE — Clinical Social Work Placement (Addendum)
    Clinical Social Work Department CLINICAL SOCIAL WORK PLACEMENT NOTE 05/31/2012  Patient:  Maria [name], Maria [name]  Account Number:  [id number] Admit date:  05/30/2012  Clinical Social Worker:  Lupita Leash Carmine Carrozza, BSW  Date/time:  05/31/2012 01:31 PM  Clinical Social Work is seeking post-discharge placement for this patient at the following level of care:   SKILLED NURSING   (*CSW will update this form in Epic as items are completed)   05/31/2012  Patient/family provided with [facility] Department of Clinical Social Work's list of facilities offering this level of care within the geographic area requested by the patient (or if unable, by the patient's family).  05/31/2012  Patient/family informed of their freedom to choose among providers that offer the needed level of care, that participate in Medicare, Medicaid or managed care program needed by the patient, have an available bed and are willing to accept the patient.  05/31/2012  Patient/family informed of [facility]' ownership interest in [facility], as well as of the fact that they are under no obligation to receive care at this facility.  PASARR submitted to EDS on 05/31/2012 PASARR number received from EDS on 05/31/2012  FL2 transmitted to all facilities in geographic area requested by pt/family on  05/31/2012 FL2 transmitted to all facilities within larger geographic area on 05/31/2012  Patient informed that his/her managed care company has contracts with or will negotiate with  certain facilities, including the following:   Patient has commercial [employer]. SNF has requested SNF authorization.   06/04/12- still awaiting SNF authorization from [place].  CSW is attempting to reach contact person at [facility] to determine if process can be hurried.  06/05/12  Received ok per Monia Pouch around 5:15 last night- they would not approve an ambulance to transport.  Spoke with Victorino Dike at [facility]. Will plan d/c to SNF in the  a.m.-- contacted patient's niece Mamie- she will be able to transport patient via car in the a.m.   Patient/family informed of bed offers received: 05/31/12  Patient chooses bed at [facility] Physician recommends and patient chooses bed at    Patient to be transferred to [facility] on  06/05/12 Patient to be transferred to facility by Care with family The following physician request were entered in Epic:   Additional Comments: Patient and niece were extremely appreciative of all CSW intervention as well as dc plan to SNF.  Notified SNF, patient's nurse and also notified Shameka at [facility] of d/c today  06/05/12     Lorri Frederick. [place]  [phone]

## 2012-05-31 NOTE — Progress Notes (Signed)
Subjective: Doing well.  Pain controlled.  Lives alone.  Needs snf placement for rehab.  Objective: Vital signs in last 24 hours: Temp:  [97.2 F (36.2 C)-99.9 F (37.7 C)] 98.6 F (37 C) (06/27 0630) Pulse Rate:  [68-89] 89  (06/27 0630) Resp:  [16-40] 18  (06/27 0630) BP: (118-153)/(56-84) 137/61 mmHg (06/27 0630) SpO2:  [95 %-100 %] 98 % (06/27 0630)  Intake/Output from previous day: 06/26 0701 - 06/27 0700 In: 2180 [I.V.:2180] Out: 2505 [Urine:2230; Drains:200; Blood:75] Intake/Output this shift:     Basename 05/31/12 0510  HGB 9.9*    Basename 05/31/12 0510  WBC 5.5  RBC 3.58*  HCT 28.9*  PLT 175    Basename 05/31/12 0510  NA 138  K 3.5  CL 103  CO2 28  BUN 12  CREATININE 1.10  GLUCOSE 142*  CALCIUM 8.4   No results found for this basename: LABPT:2,INR:2 in the last 72 hours  Exam:  Some bleeding thru dressing.  Calf nt, nvi.   Assessment/Plan: Arrange snf placement.  D/c dilaudid and foley.  Anticipate transfer to snf fri or sat if bed available.   Chisa Kushner M 05/31/2012, 11:21 AM

## 2012-05-31 NOTE — Progress Notes (Signed)
Order received, chart reviewed, noted plan is for patient to go to SNF due to slow progress and no assist at home. Will defer OT eval to that facility. Acute OT will sign off.

## 2012-06-01 LAB — CBC
HCT: 28.2 % — ABNORMAL LOW (ref 36.0–46.0)
Hemoglobin: 9.7 g/dL — ABNORMAL LOW (ref 12.0–15.0)
WBC: 7.6 10*3/uL (ref 4.0–10.5)

## 2012-06-01 LAB — BASIC METABOLIC PANEL
BUN: 8 mg/dL (ref 6–23)
Chloride: 99 mEq/L (ref 96–112)
GFR calc Af Amer: 62 mL/min — ABNORMAL LOW (ref >90)
GFR calc non Af Amer: 54 mL/min — ABNORMAL LOW (ref >90)
Glucose, Bld: 107 mg/dL — ABNORMAL HIGH (ref 70–99)
Potassium: 3.5 mEq/L (ref 3.5–5.1)
Sodium: 138 mEq/L (ref 135–145)

## 2012-06-01 MED ORDER — WHITE PETROLATUM GEL
Status: AC
  Administered 2012-06-01: 12:00:00
  Filled 2012-06-01: qty 5

## 2012-06-01 MED ORDER — METHOCARBAMOL 500 MG PO TABS: 500.0000 mg | ORAL_TABLET | Freq: Four times a day (QID) | ORAL | Status: AC

## 2012-06-01 MED ORDER — ENOXAPARIN SODIUM 30 MG/0.3ML ~~LOC~~ SOLN: 30.0000 mg | Freq: Two times a day (BID) | SUBCUTANEOUS | Status: DC

## 2012-06-01 MED ORDER — HYDROCODONE-ACETAMINOPHEN 10-325 MG PO TABS: 1.0000 | ORAL_TABLET | ORAL | Status: AC | PRN

## 2012-06-01 NOTE — Discharge Summary (Signed)
Maria [name], MELDER NO.:  [id number]  MEDICAL RECORD NO.:  [id number]  LOCATION:  [id number]                        FACILITY:  [facility]  PHYSICIAN:  Genene Churn. Barry Dienes, P.A.   DATE OF BIRTH:  May 12, 1941  DATE OF ADMISSION:  05/30/2012 DATE OF DISCHARGE:                              DISCHARGE SUMMARY   FINAL DIAGNOSES: 1. Status post right total knee replacement for end-stage degenerative     joint disease. 2. History of bleeding ulcers. 3. Hypercholesterolemia. 4. Hypertension.  HISTORY OF PRESENT ILLNESS:  A 71 year old black female with history of end-stage DJD right knee and chronic pain presents to our office for preop evaluation for total knee replacement.  She had progressively worsening pain with fail to response with conservative treatment. Significant decrease in her daily activities due to the ongoing complaint.  HOSPITAL COURSE:  On May 30, 2012, the patient was taken to the [facility] OR and a right total knee replacement procedure performed.  SURGEON:  Loreta Ave, M.D.  ASSISTANT:  Genene Churn. Denton Meek.  ANESTHESIA:  General with femoral nerve block.  BLOOD LOSS:  Minimal blood loss.  PATHOLOGY:  No specimens or cultures.  COUNTS:  All counts correct.  TOURNIQUET TIME:  65 minutes.  COMPLICATIONS:  No surgical or anesthesia complications.  DETAILS OF PROCEDURE:  The patient was transferred to recovery in stable condition.  After arriving to the orthopedic unit, Lovenox was started for DVT prophylaxis.  I elected to not to use Coumadin due to previous history of bleeding ulcers.  On May 31, 2012, the patient doing well with good pain control.  The patient was alone and will be needing skilled nursing facility placement for rehab.  Hemoglobin 9.9, hematocrit 28.9, glucose 142.  Slight bleeding through her dressing.  Calf nontender.  Neurovascular intact.  Skin warm and dry.  Awaiting discharge planning.  Discontinue Dilaudid and  Foley. Transfer when bed available.  On June 01, 2012, the patient was doing well again with good pain control.  Vital signs stable, afebrile. Hemoglobin 9.7, hematocrit 28.2.  Right knee wound looks good.  Staples intact.  No drainage of signs or infection.  Calf nontender.  Neurovascular intact.  Skin warm and dry. Hemovac drain removed.  The patient is stable for transfer to skilled nursing facility when bed available.  CONDITION:  Good and stable.  DISPOSITION:  Transfer to skilled nursing facility for rehab.  MEDICATIONS:  See detailed AVS in chart.  HOME INSTRUCTIONS:  Wile at the skilled nursing facility, the patient will continue PT/OT to improve ambulation and knee range of motion and strengthening.  Weight bear as tolerated with walker and can progress to a single prong cane as tolerated.  Daily dressing changes with 4 x 4 gauze and apply TED hose over this.  Okay to shower, but no tub soaking. Do not apply any creams or ointments to her incision.  CPM 0-70 degrees 6-8 hours per day and increase x 10 degrees daily as tolerated.  Lovenox x3 four weeks postop for DVT prophylaxis.  She has a follow up visit with Dr. Eulah Pont when she is 72 weeks postop for recheck and I will remove staples at  that time.  If there are any questions or concerns, call our office at [phone].     Genene Churn. Denton Meek.     JMO/MEDQ  D:  06/01/2012  T:  06/01/2012  Job:  [id number]

## 2012-06-01 NOTE — Progress Notes (Signed)
PT progress Note:    06/01/12 1000  PT Visit Information  Last PT Received On 06/01/12  Assistance Needed +2 (safety)  PT Time Calculation  PT Start Time 0934  PT Stop Time 0958  PT Time Calculation (min) 24 min  Subjective Data  Subjective "ok, i just need a minute"  Precautions  Precautions Knee  Required Braces or Orthoses Knee Immobilizer - Right  Knee Immobilizer - Right On except when in CPM  Restrictions  RLE Weight Bearing WBAT  Cognition  Overall Cognitive Status Appears within functional limits for tasks assessed/performed  Arousal/Alertness Awake/alert  Orientation Level Oriented X4 / Intact  Behavior During Session [facility] for tasks performed  Bed Mobility  Bed Mobility Supine to Sit;Sit to Supine  Supine to Sit 4: Min assist;HOB flat  Sitting - Scoot to Edge of Bed 4: Min guard  Details for Bed Mobility Assistance Required increased time to transition.  Min (A) needed for RLE to EOB & to lift shoulders/trunk to sitting upright.  Cues for technique.    Transfers  Transfers Sit to Stand;Stand to Sit  Sit to Stand 3: Mod assist;With upper extremity assist;From bed  Stand to Sit 3: Mod assist;With upper extremity assist;With armrests;To chair/3-in-1  Stand to Sit: Patient Percentage 70%  Details for Transfer Assistance Cues for hand placement & LE positioning.     Ambulation/Gait  Ambulation/Gait Assistance 4: Min assist  Ambulation Distance (Feet) 30 Feet  Assistive device Rolling walker  Ambulation/Gait Assistance Details Cues for sequencing, RW advancement, tall posture, keep eyes open, & max encouragement entire distance.    Gait Pattern Step-to pattern;Decreased step length - left;Decreased stance time - right;Trunk flexed  Stairs No  Wheelchair Mobility  Wheelchair Mobility No  Balance  Balance Assessed No  Exercises  Exercises Total Joint  Total Joint Exercises  Ankle Circles/Pumps AROM;Both;10 reps  Quad Sets AROM;Both;10 reps  Straight Leg Raises  AAROM;Right;10 reps  PT - End of Session  Equipment Utilized During Treatment Gait belt;Right knee immobilizer  Activity Tolerance Patient limited by pain;Patient limited by fatigue  Patient left in chair;with call bell/phone within reach;with family/visitor present  PT - Assessment/Plan  Comments on Treatment Session Pt slowly progressing with PT goals.  Requires strong encouragement entire session & increased time for all mobility at this date.    PT Plan Discharge plan remains appropriate  PT Frequency 7X/week  Follow Up Recommendations Skilled nursing facility  Equipment Recommended Defer to next venue  Acute Rehab PT Goals  Time For Goal Achievement 06/07/12  Potential to Achieve Goals Good  PT Goal: Supine/Side to Sit - Progress Progressing toward goal  PT Goal: Sit to Stand - Progress Progressing toward goal  PT Goal: Stand to Sit - Progress Progressing toward goal  PT Goal: Ambulate - Progress Progressing toward goal     Pain:  5/10 knee.  Premedicated.    Maria [name], [place] [phone] 06/01/2012

## 2012-06-01 NOTE — Progress Notes (Signed)
Subjective: Doing well.  Pain controlled.     Objective: Vital signs in last 24 hours: Temp:  [98.8 F (37.1 C)-99.4 F (37.4 C)] 98.8 F (37.1 C) (06/28 0629) Pulse Rate:  [94-107] 107  (06/28 0629) Resp:  [16-18] 16  (06/28 1200) BP: (131-152)/(49-75) 151/75 mmHg (06/28 0629) SpO2:  [93 %-100 %] 98 % (06/28 1200)  Intake/Output from previous day: 06/27 0701 - 06/28 0700 In: 1400 [P.O.:720; I.V.:680] Out: 1060 [Urine:1000; Drains:60] Intake/Output this shift:     Basename 06/01/12 0610 05/31/12 0510  HGB 9.7* 9.9*    Basename 06/01/12 0610 05/31/12 0510  WBC 7.6 5.5  RBC 3.46* 3.58*  HCT 28.2* 28.9*  PLT 174 175    Basename 06/01/12 0610 05/31/12 0510  NA 138 138  K 3.5 3.5  CL 99 103  CO2 31 28  BUN 8 12  CREATININE 1.02 1.10  GLUCOSE 107* 142*  CALCIUM 9.0 8.4   No results found for this basename: LABPT:2,INR:2 in the last 72 hours  Exam.  Wound looks good.  Staples intact.  No signs of infection.  Calf nt, nvi.  Drain removed.  Assessment/Plan: Awaiting d/c planning to snf.  Continue present care.  Transfer Saturday if bed available.   Maria [name] M 06/01/2012, 1:04 PM

## 2012-06-02 LAB — BASIC METABOLIC PANEL
BUN: 10 mg/dL (ref 6–23)
CO2: 30 mEq/L (ref 19–32)
GFR calc non Af Amer: 54 mL/min — ABNORMAL LOW (ref >90)
Glucose, Bld: 121 mg/dL — ABNORMAL HIGH (ref 70–99)
Potassium: 3.3 mEq/L — ABNORMAL LOW (ref 3.5–5.1)
Sodium: 138 mEq/L (ref 135–145)

## 2012-06-02 LAB — CBC
HCT: 27.2 % — ABNORMAL LOW (ref 36.0–46.0)
Hemoglobin: 9.5 g/dL — ABNORMAL LOW (ref 12.0–15.0)
MCH: 27.9 pg (ref 26.0–34.0)
MCHC: 34.9 g/dL (ref 30.0–36.0)
RBC: 3.4 MIL/uL — ABNORMAL LOW (ref 3.87–5.11)

## 2012-06-02 NOTE — Progress Notes (Signed)
Physical Therapy Treatment Patient Details Name: Maria [name] MRN: [id number] DOB: Jan 24, 1941 Today's Date: 06/02/2012 Time: 4098-1191 PT Time Calculation (min): 35 min  PT Assessment / Plan / Recommendation Comments on Treatment Session  Pt feeling much better this afternoon, O2 removed for activity but then reapplied, O2 sats 100% after treatment.  Quad contraction 2/5. Pt left in CPM, 0-65 degrees.    Follow Up Recommendations  Skilled nursing facility    Barriers to Discharge        Equipment Recommendations  Defer to next venue    Recommendations for Other Services    Frequency 7X/week   Plan Frequency remains appropriate;Discharge plan remains appropriate    Precautions / Restrictions Precautions Precautions: Knee Precaution Booklet Issued: No Required Braces or Orthoses: Knee Immobilizer - Right Knee Immobilizer - Right: On except when in CPM Restrictions Weight Bearing Restrictions: Yes RLE Weight Bearing: Weight bearing as tolerated   Pertinent Vitals/Pain O2 sats 100% on 2L O2 Pain "manageable"    Mobility  Bed Mobility Bed Mobility: Supine to Sit;Sit to Supine Supine to Sit: 4: Min assist;HOB flat Sit to Supine: 4: Min assist;HOB flat Details for Bed Mobility Assistance: min A to help right leg in and out of bed Transfers Transfers: Sit to Stand;Stand to Sit Sit to Stand: 4: Min guard;From chair/3-in-1;From bed;With upper extremity assist Stand to Sit: 4: Min guard;To bed;To chair/3-in-1;With upper extremity assist Details for Transfer Assistance: pt effectively remembering precautions, progressing well with transfers Ambulation/Gait Ambulation/Gait Assistance: 4: Min guard Ambulation Distance (Feet): 20 Feet Assistive device: Rolling walker Ambulation/Gait Assistance Details: pt felt much better with ambulation this afternoon, vc's for upright posture Gait Pattern:  (gait affected by KI) Gait velocity: increased from this AM Stairs: No Wheelchair  Mobility Wheelchair Mobility: No    Exercises Total Joint Exercises Ankle Circles/Pumps: AROM;Both;10 reps Quad Sets: Left;AROM;10 reps;Seated Heel Slides: AAROM;Right;10 reps;Supine Straight Leg Raises: AAROM;Right;10 reps;Supine Long Arc Quad: AROM;Right;10 reps;Seated Knee Flexion: AAROM;5 reps;Right;Supine   PT Diagnosis:    PT Problem List:   PT Treatment Interventions:     PT Goals Acute Rehab PT Goals PT Goal Formulation: With patient Time For Goal Achievement: 06/07/12 Potential to Achieve Goals: Good Pt will go Supine/Side to Sit: with supervision PT Goal: Supine/Side to Sit - Progress: Progressing toward goal Pt will go Sit to Supine/Side: with supervision PT Goal: Sit to Supine/Side - Progress: Progressing toward goal Pt will go Sit to Stand: with supervision PT Goal: Sit to Stand - Progress: Progressing toward goal Pt will go Stand to Sit: with supervision PT Goal: Stand to Sit - Progress: Progressing toward goal Pt will Ambulate: 51 - 150 feet;with supervision;with rolling walker PT Goal: Ambulate - Progress: Progressing toward goal Pt will Perform Home Exercise Program: with min assist PT Goal: Perform Home Exercise Program - Progress: Progressing toward goal  Visit Information  Last PT Received On: 06/02/12 Assistance Needed: +1    Subjective Data  Subjective: I feel much better since I have been wearing my oxygen Patient Stated Goal: walk   Cognition  Overall Cognitive Status: Appears within functional limits for tasks assessed/performed Arousal/Alertness: Awake/alert Orientation Level: Oriented X4 / Intact Behavior During Session: [facility] for tasks performed    Balance  Balance Balance Assessed: No Static Standing Balance Static Standing - Balance Support: During functional activity;Left upper extremity supported Static Standing - Level of Assistance: 5: Stand by assistance  End of Session PT - End of Session Equipment Utilized During Treatment: Gait  belt;Right  knee immobilizer Activity Tolerance: Patient tolerated treatment well Patient left: in bed;in CPM;with call bell/phone within reach Nurse Communication: Mobility status   GP   Lyanne Co, PT  Acute Rehab Services  [phone]   Lyanne Co 06/02/2012, 4:33 PM

## 2012-06-02 NOTE — Progress Notes (Signed)
Physical Therapy Treatment Patient Details Name: Maria [name] MRN: [id number] DOB: 05-12-1941 Today's Date: 06/02/2012 Time: 1119-1202 PT Time Calculation (min): 43 min  PT Assessment / Plan / Recommendation Comments on Treatment Session  Pt continues to progress but slowly.  Pt limited by feeling extermely hot with all mobility and exhausted.  Pt concerned that her ulcers could be bleeding again and requested to see her hgb level which is 9.5 today.  Encouragement given in her progress.  PT will continue to follow.    Follow Up Recommendations  Skilled nursing facility    Barriers to Discharge        Equipment Recommendations  Defer to next venue    Recommendations for Other Services    Frequency 7X/week   Plan Frequency remains appropriate;Discharge plan remains appropriate    Precautions / Restrictions Precautions Precautions: Knee Precaution Booklet Issued: No Required Braces or Orthoses: Knee Immobilizer - Right Knee Immobilizer - Right: On except when in CPM Restrictions Weight Bearing Restrictions: Yes RLE Weight Bearing: Weight bearing as tolerated   Pertinent Vitals/Pain 6/10 right knee    Mobility  Bed Mobility Bed Mobility: Not assessed Transfers Transfers: Sit to Stand;Stand to Sit Sit to Stand: 4: Min assist;From toilet;From chair/3-in-1;With upper extremity assist Stand to Sit: 4: Min assist;To chair/3-in-1;To toilet;With upper extremity assist Details for Transfer Assistance: vc's for hand placement and min A given to RLE with sitting to keep pressure off for pain control. Ambulation/Gait Ambulation/Gait Assistance: 4: Min assist Ambulation Distance (Feet): 50 Feet Assistive device: Rolling walker Ambulation/Gait Assistance Details: cues for upright posture, encouragement given as pt needed several standing breaks and feeling very weak and hot. Gait Pattern: Step-to pattern;Decreased stance time - right;Decreased step length - left Gait velocity: very  slow Stairs: No Wheelchair Mobility Wheelchair Mobility: No        PT Goals Acute Rehab PT Goals PT Goal Formulation: With patient Time For Goal Achievement: 06/07/12 Potential to Achieve Goals: Good Pt will go Supine/Side to Sit: with supervision Pt will go Sit to Supine/Side: with supervision Pt will go Sit to Stand: with supervision PT Goal: Sit to Stand - Progress: Progressing toward goal Pt will go Stand to Sit: with supervision PT Goal: Stand to Sit - Progress: Progressing toward goal Pt will Ambulate: 51 - 150 feet;with supervision;with rolling walker PT Goal: Ambulate - Progress: Progressing toward goal Pt will Perform Home Exercise Program: with min assist  Visit Information  Last PT Received On: 06/02/12 Assistance Needed: +1    Subjective Data  Subjective: I am just so tired and hot Patient Stated Goal: Move without pain   Cognition  Overall Cognitive Status: Appears within functional limits for tasks assessed/performed Arousal/Alertness: Awake/alert Orientation Level: Oriented X4 / Intact Behavior During Session: [facility] for tasks performed    Balance  Balance Balance Assessed: Yes Static Standing Balance Static Standing - Balance Support: During functional activity;Left upper extremity supported Static Standing - Level of Assistance: 5: Stand by assistance  End of Session PT - End of Session Equipment Utilized During Treatment: Gait belt;Right knee immobilizer Activity Tolerance: Patient limited by fatigue Patient left: in chair;with call bell/phone within reach;with family/visitor present Nurse Communication: Mobility status   GP   Lyanne Co, PT  Acute Rehab Services  [phone]   Lyanne Co 06/02/2012, 1:54 PM

## 2012-06-02 NOTE — Progress Notes (Signed)
Patient ID: Maria [name], female   DOB: 23-Mar-1941, 71 y.o.   MRN: [id number]     Subjective:  Patient reports pain as mild to moderate.  Denies CP or SOB.  Objective:   VITALS:   Filed Vitals:   06/01/12 1200 06/01/12 1400 06/01/12 2048 06/02/12 0601  BP:  140/71 134/69 137/62  Pulse:  95 105 98  Temp:  98.9 F (37.2 C) 99.5 F (37.5 C) 98.6 F (37 C)  TempSrc:    Oral  Resp: 16 16 18 18   SpO2: 98% 94% 91% 90%    ABD soft Sensation intact distally Dorsiflexion/Plantar flexion intact Incision: dressing C/D/I and no drainage  LABS  Results for orders placed during the hospital encounter of 05/30/12 (from the past 24 hour(s))  CBC     Status: Abnormal   Collection Time   06/02/12  5:04 AM      Component Value Range   WBC 8.9  4.0 - 10.5 K/uL   RBC 3.40 (*) 3.87 - 5.11 MIL/uL   Hemoglobin 9.5 (*) 12.0 - 15.0 g/dL   HCT 40.9 (*) 81.1 - 91.4 %   MCV 80.0  78.0 - 100.0 fL   MCH 27.9  26.0 - 34.0 pg   MCHC 34.9  30.0 - 36.0 g/dL   RDW 78.2  95.6 - 21.3 %   Platelets 181  150 - 400 K/uL  BASIC METABOLIC PANEL     Status: Abnormal   Collection Time   06/02/12  5:04 AM      Component Value Range   Sodium 138  135 - 145 mEq/L   Potassium 3.3 (*) 3.5 - 5.1 mEq/L   Chloride 97  96 - 112 mEq/L   CO2 30  19 - 32 mEq/L   Glucose, Bld 121 (*) 70 - 99 mg/dL   BUN 10  6 - 23 mg/dL   Creatinine, Ser 0.86  0.50 - 1.10 mg/dL   Calcium 8.9  8.4 - 57.8 mg/dL   GFR calc non Af Amer 54 (*) >90 mL/min   GFR calc Af Amer 63 (*) >90 mL/min    No results found.  Assessment/Plan: 3 Days Post-Op   Active Problems:  * No active hospital problems. *    Advance diet Up with therapy Continue plan per Dr Eulah Pont Continue to monitor ABLA Plan for SNF transfer on Monday   Haskel Khan 06/02/2012, 10:50 AM   Teryl Lucy, MD [phone] pager

## 2012-06-03 ENCOUNTER — Encounter (HOSPITAL_COMMUNITY): Payer: Self-pay | Admitting: Orthopedic Surgery

## 2012-06-03 DIAGNOSIS — M1711 Unilateral primary osteoarthritis, right knee: Secondary | ICD-10-CM

## 2012-06-03 HISTORY — DX: Unilateral primary osteoarthritis, right knee: M17.11

## 2012-06-03 NOTE — Progress Notes (Signed)
Physical Therapy Treatment Patient Details Name: Maria [name] MRN: [id number] DOB: August 28, 1941 Today's Date: 06/03/2012 Time: 0832-0903 PT Time Calculation (min): 31 min  PT Assessment / Plan / Recommendation Comments on Treatment Session       Follow Up Recommendations  Skilled nursing facility    Barriers to Discharge        Equipment Recommendations  Defer to next venue    Recommendations for Other Services    Frequency 7X/week   Plan Frequency remains appropriate;Discharge plan remains appropriate    Precautions / Restrictions Precautions Precautions: Knee Precaution Booklet Issued: No Required Braces or Orthoses: Knee Immobilizer - Right Knee Immobilizer - Right: On except when in CPM Restrictions RLE Weight Bearing: Weight bearing as tolerated   Pertinent Vitals/Pain 2/10 knee    Mobility  Bed Mobility Bed Mobility: Not assessed Transfers Transfers: Sit to Stand;Stand to Sit Sit to Stand: 4: Min guard;With upper extremity assist;With armrests;From chair/3-in-1 Stand to Sit: 5: Supervision;With upper extremity assist;With armrests;To chair/3-in-1 Details for Transfer Assistance: No physical (A) needed.  Cues for anterior weight shifting of trunk over BOS with sit>stand.   Ambulation/Gait Ambulation/Gait Assistance: 5: Supervision Ambulation Distance (Feet): 75 Feet Assistive device: Rolling walker Ambulation/Gait Assistance Details: Cues for increased step/stride length, & encouragement to increase gait speed.   Gait Pattern: Step-through pattern;Decreased stride length;Decreased step length - right;Decreased step length - left Stairs: No Wheelchair Mobility Wheelchair Mobility: No    Exercises Total Joint Exercises Ankle Circles/Pumps: AROM;Both;15 reps Quad Sets: AROM;Both;15 reps Heel Slides: AAROM;Right;15 reps Hip ABduction/ADduction: AAROM;Right;15 reps Straight Leg Raises: AAROM;Right;15 reps    PT Goals Acute Rehab PT Goals Time For Goal  Achievement: 06/07/12 Potential to Achieve Goals: Good PT Goal: Sit to Stand - Progress: Progressing toward goal PT Goal: Stand to Sit - Progress: Met PT Goal: Ambulate - Progress: Met PT Goal: Perform Home Exercise Program - Progress: Not met  Visit Information  Last PT Received On: 06/03/12 Assistance Needed: +1    Subjective Data      Cognition  Overall Cognitive Status: Appears within functional limits for tasks assessed/performed Arousal/Alertness: Awake/alert Orientation Level: Oriented X4 / Intact Behavior During Session: [facility] for tasks performed    Balance  Balance Balance Assessed: No  End of Session PT - End of Session Equipment Utilized During Treatment: Gait belt;Right knee immobilizer Activity Tolerance: Patient tolerated treatment well Patient left: in chair;with call bell/phone within reach Nurse Communication: Mobility status   GP     Lara Mulch 06/03/2012, 11:21 AM   Maria [name], PTA [phone] 06/03/2012

## 2012-06-03 NOTE — Progress Notes (Signed)
Patient ID: Maria [name], female   DOB: [birth year], 71 y.o.   MRN: [id number]     Subjective:  Patient reports pain as mild to moderate.  She states that she is doing better today.  Needed O2 after PT yesterday.  Objective:   VITALS:   Filed Vitals:   06/02/12 0601 06/02/12 1301 06/02/12 2331 06/03/12 0527  BP: 137/62 116/53 132/60 123/73  Pulse: 98 74 84 68  Temp: 98.6 F (37 C) 98.9 F (37.2 C) 98.7 F (37.1 C) 98.1 F (36.7 C)  TempSrc: Oral Oral    Resp: 18 18 18 18   SpO2: 90% 90% 100% 100%    ABD soft Sensation intact distally Dorsiflexion/Plantar flexion intact Incision: dressing C/D/I and no drainage  LABS  No results found for this or any previous visit (from the past 24 hour(s)).  No results found.  Assessment/Plan: 4 Days Post-Op TKA  Active Problems:  * No active hospital problems. *    Advance diet Up with therapy Plan for discharge tomorrow O2 at 100% today  DOUGLAS PARRY, BRANDON 06/03/2012, 8:30 AM   Teryl Lucy, MD Cell [phone] Pager [phone]

## 2012-06-04 LAB — CBC
MCH: 28 pg (ref 26.0–34.0)
MCV: 80.1 fL (ref 78.0–100.0)
Platelets: 238 10*3/uL (ref 150–400)
RDW: 13.8 % (ref 11.5–15.5)

## 2012-06-04 LAB — BASIC METABOLIC PANEL
Calcium: 9.5 mg/dL (ref 8.4–10.5)
Creatinine, Ser: 1.01 mg/dL (ref 0.50–1.10)
GFR calc Af Amer: 63 mL/min — ABNORMAL LOW (ref >90)

## 2012-06-04 NOTE — Progress Notes (Signed)
Patient ID: Maria [name], female   DOB: November 05, 1941, 71 y.o.   MRN: [id number]  D/c summary addendum #[id number]

## 2012-06-04 NOTE — Discharge Summary (Signed)
NAMELENOX, LADOUCEUR NO.:  [id number]  MEDICAL RECORD NO.:  [id number]  LOCATION:  5N21C                        FACILITY:  [facility]  PHYSICIAN:  Loreta Ave, M.D. DATE OF BIRTH:  1941-09-13  DATE OF ADMISSION:  05/30/2012 DATE OF DISCHARGE:                              DISCHARGE SUMMARY   ADDENDUM:  HOSPITAL COURSE:  June 02, 2012, the patient reports mild to moderate right knee pain.  No complaints of chest pain or shortness of breath. Hemoglobin 9.5, hematocrit 27.2.  Knee wound looks good and staples intact.  No drainage or signs of infection.  June 03, 2012, the patient doing better today.  Needed O2 after therapy but currently feeling much better.  Blood pressure 123/73, pulse 68, temp 98.1, respirations 18, O2 saturation 100%.  Knee wound looks good and staples intact.  There is no drainage or signs of infection.  Calves nontender neurovascularly intact.  Skin warm and dry.  June 04, 2012, the patient doing well with good pain control.  States that she is ready for transfer to a skilled nursing facility in [place] today.  Vital signs stable.  Afebrile. Hemoglobin 9.3, hematocrit 26.6.  Sodium 135, potassium 2.9, chloride 93, glucose 126, BUN 15, creatinine 1.01.  Knee wound looks good and staples intact.  There is no drainage or signs of infection.  Calves nontender, neurovascularly intact.  Skin warm and dry. The patient ready for transfer to a skilled nursing facility today.  For hypokalemia, we will give KCl 60 mEq p.o. x1 dose now.  Needs follow up visit with Dr. Eulah Pont when she is 2 weeks postop.     Genene Churn. Denton Meek.   ______________________________ Loreta Ave, M.D.    JMO/MEDQ  D:  06/04/2012  T:  06/04/2012  Job:  [id number]

## 2012-06-04 NOTE — Progress Notes (Signed)
Subjective: Doing well.  Pain controlled.  Ready for transfer to snf in [place].     Objective: Vital signs in last 24 hours: Temp:  [98.1 F (36.7 C)-100.9 F (38.3 C)] 98.4 F (36.9 C) (07/01 0602) Pulse Rate:  [77-87] 77  (07/01 0602) Resp:  [16-18] 16  (07/01 0602) BP: (123-139)/(64-70) 132/70 mmHg (07/01 0602) SpO2:  [96 %-98 %] 97 % (07/01 0602)  Intake/Output from previous day: 06/30 0701 - 07/01 0700 In: 240 [P.O.:240] Out: -  Intake/Output this shift:     Basename 06/04/12 0440 06/02/12 0504  HGB 9.3* 9.5*    Basename 06/04/12 0440 06/02/12 0504  WBC 7.0 8.9  RBC 3.32* 3.40*  HCT 26.6* 27.2*  PLT 238 181    Basename 06/04/12 0440 06/02/12 0504  NA 135 138  K 2.9* 3.3*  CL 93* 97  CO2 31 30  BUN 15 10  CREATININE 1.01 1.01  GLUCOSE 126* 121*  CALCIUM 9.5 8.9   No results found for this basename: LABPT:2,INR:2 in the last 72 hours  Exam:  Knee wound looks good.  Staples intact.  No drainage or signs of infection.  Calf nt, nvi.    Assessment/Plan: Transfer to snf today.   Hypokalemia:  kcl 60 meq x 1 dose now.   F/u with dr Eulah Pont 2 weeks postop.     Maria [name] M 06/04/2012, 10:33 AM

## 2012-06-04 NOTE — Progress Notes (Signed)
PT Progress Note:      06/04/12 1100  PT Visit Information  Last PT Received On 06/04/12  Assistance Needed +1  PT Time Calculation  PT Start Time 1130  PT Stop Time 1154  PT Time Calculation (min) 24 min  Subjective Data  Subjective "I need to go to the bathroom"  Precautions  Precautions Knee  Precaution Booklet Issued No  Required Braces or Orthoses Knee Immobilizer - Right  Knee Immobilizer - Right On except when in CPM  Restrictions  RLE Weight Bearing WBAT  Cognition  Overall Cognitive Status Appears within functional limits for tasks assessed/performed  Arousal/Alertness Awake/alert  Orientation Level Oriented X4 / Intact  Behavior During Session [facility] for tasks performed  Bed Mobility  Bed Mobility Not assessed  Transfers  Transfers Sit to Stand;Stand to Sit  Sit to Stand 5: Supervision;With upper extremity assist;With armrests;From chair/3-in-1;From toilet  Stand to Sit 5: Supervision;With upper extremity assist;With armrests;To chair/3-in-1;To toilet  Details for Transfer Assistance Pt demo's proper technique.  Ambulation/Gait  Ambulation/Gait Assistance 5: Supervision  Ambulation Distance (Feet) 120 Feet  Assistive device Rolling walker  Ambulation/Gait Assistance Details encouragement to increase gait speed & to increase stride length.  Pt cont's to increase fluidity & reciprocal gait pattern.    Gait Pattern Step-through pattern;Decreased step length - left  Stairs No  Wheelchair Mobility  Wheelchair Mobility No  Balance  Balance Assessed Yes  Static Standing Balance  Static Standing - Balance Support During functional activity;No upper extremity supported  Static Standing - Level of Assistance 5: Stand by assistance  Static Standing - Comment/# of Minutes Pt able to stand at sink while washing hands & weight shifting to reach for towel with SBA.    PT - End of Session  Equipment Utilized During Treatment Gait belt;Right knee immobilizer  Activity Tolerance  Patient tolerated treatment well  Patient left in chair;with call bell/phone within reach  PT - Assessment/Plan  Comments on Treatment Session Pt making steady progress with PT goals.    PT Plan Frequency remains appropriate;Discharge plan remains appropriate  PT Frequency 7X/week  Follow Up Recommendations Skilled nursing facility  Equipment Recommended Defer to next venue  Acute Rehab PT Goals  Time For Goal Achievement 06/07/12  Potential to Achieve Goals Good  PT Goal: Sit to Stand - Progress Met  PT Goal: Stand to Sit - Progress Met  PT Goal: Ambulate - Progress Met     No reports of pain.  Premedicated.     Maria [name], [place] [phone] 06/04/2012

## 2012-06-05 NOTE — Progress Notes (Signed)
Utilization review completed. Markese Bloxham, RN, BSN. 

## 2012-06-05 NOTE — Progress Notes (Signed)
PT Cancel Note:  PT session cancelled due to volunteers arriving to transport pt to car for d/c.  Assisted pt from recliner to w/c.    Verdell Face, PTA [phone] 06/05/2012

## 2012-09-26 ENCOUNTER — Telehealth: Payer: Self-pay | Admitting: Family Medicine

## 2012-09-26 NOTE — Telephone Encounter (Signed)
Pt called and has a Health Screening form that pt has to have completed by tomorrow 09/27/12. Pt said that its the ht, wt, bmi, bp, pulse, hr, glucose, tchol ratio, hdl, total cholesterol. Pls call asap. Pt wants to bring from in tomorrow morning. Pt is aware that pcp is out of office. This is urgent.

## 2012-09-27 ENCOUNTER — Telehealth: Payer: Self-pay

## 2012-09-27 NOTE — Telephone Encounter (Signed)
Attempt to capp- VM - LMTCB if questions - form done - will fax to number at top of it , original will be ready for pick up . KIK

## 2012-09-27 NOTE — Telephone Encounter (Signed)
This has beed taken care of - LMTCB if problem - form faxed to # on sheet and then she can pick up original .

## 2012-10-13 ENCOUNTER — Other Ambulatory Visit: Payer: Self-pay | Admitting: Family Medicine

## 2012-10-29 ENCOUNTER — Encounter: Payer: Self-pay | Admitting: Family Medicine

## 2012-10-29 ENCOUNTER — Ambulatory Visit (INDEPENDENT_AMBULATORY_CARE_PROVIDER_SITE_OTHER): Payer: Managed Care, Other (non HMO) | Admitting: Family Medicine

## 2012-10-29 VITALS — BP 140/90 | Temp 98.2°F | Ht 61.25 in | Wt 150.0 lb

## 2012-10-29 DIAGNOSIS — M1711 Unilateral primary osteoarthritis, right knee: Secondary | ICD-10-CM

## 2012-10-29 DIAGNOSIS — Z0000 Encounter for general adult medical examination without abnormal findings: Secondary | ICD-10-CM

## 2012-10-29 DIAGNOSIS — K219 Gastro-esophageal reflux disease without esophagitis: Secondary | ICD-10-CM

## 2012-10-29 DIAGNOSIS — E785 Hyperlipidemia, unspecified: Secondary | ICD-10-CM

## 2012-10-29 DIAGNOSIS — I10 Essential (primary) hypertension: Secondary | ICD-10-CM

## 2012-10-29 DIAGNOSIS — Z23 Encounter for immunization: Secondary | ICD-10-CM

## 2012-10-29 LAB — LIPID PANEL
Cholesterol: 191 mg/dL (ref 0–200)
LDL Cholesterol: 135 mg/dL — ABNORMAL HIGH (ref 0–99)

## 2012-10-29 LAB — CBC WITH DIFFERENTIAL/PLATELET
Basophils Absolute: 0.1 10*3/uL (ref 0.0–0.1)
Eosinophils Absolute: 0.1 10*3/uL (ref 0.0–0.7)
Hemoglobin: 12.6 g/dL (ref 12.0–15.0)
Lymphocytes Relative: 29.3 % (ref 12.0–46.0)
MCHC: 32.4 g/dL (ref 30.0–36.0)
MCV: 82 fl (ref 78.0–100.0)
Monocytes Absolute: 0.5 10*3/uL (ref 0.1–1.0)
Neutro Abs: 3.4 10*3/uL (ref 1.4–7.7)
RDW: 16.5 % — ABNORMAL HIGH (ref 11.5–14.6)

## 2012-10-29 LAB — HEPATIC FUNCTION PANEL
ALT: 11 U/L (ref 0–35)
Alkaline Phosphatase: 113 U/L (ref 39–117)
Bilirubin, Direct: 0.1 mg/dL (ref 0.0–0.3)
Total Protein: 7.6 g/dL (ref 6.0–8.3)

## 2012-10-29 LAB — POCT URINALYSIS DIPSTICK
Blood, UA: NEGATIVE
Glucose, UA: NEGATIVE
Nitrite, UA: NEGATIVE
Protein, UA: NEGATIVE
Urobilinogen, UA: 0.2
pH, UA: 6

## 2012-10-29 MED ORDER — OMEPRAZOLE 20 MG PO CPDR: 20.0000 mg | DELAYED_RELEASE_CAPSULE | Freq: Every day | ORAL | Status: DC

## 2012-10-29 MED ORDER — POTASSIUM CHLORIDE CRYS ER 20 MEQ PO TBCR: EXTENDED_RELEASE_TABLET | ORAL | Status: DC

## 2012-10-29 MED ORDER — ATENOLOL-CHLORTHALIDONE 50-25 MG PO TABS: 0.5000 | ORAL_TABLET | Freq: Every day | ORAL | Status: DC

## 2012-10-29 MED ORDER — SIMVASTATIN 20 MG PO TABS: 20.0000 mg | ORAL_TABLET | ORAL | Status: DC

## 2012-10-29 NOTE — Patient Instructions (Signed)
Continue your current medications  Plan Claritin 10 mg,,,,,,,,,, one tablet daily in the morning  Continue your daily exercise  Return in one year for general physical examination sooner if any problems

## 2012-10-29 NOTE — Progress Notes (Signed)
  Subjective:    Patient ID: Maria [name], female    DOB: 1941/04/04, 71 y.o.   MRN: [id number]  HPI Maria [name] is a 71 year old single female nonsmoker who comes in today for a Medicare wellness examination because of a history of hyperlipidemia, hypertension, osteoarthritis  Her medications reviewed there've been no changes  She had a total knee replacement right knee in June did well no complications. She's now back to work full time.  She gets routine eye care, dental care, BSE monthly, and you mammography, colonoscopy normal, vaccinations up-to-date  Home health safety reviewed no issues identified, no guns in the house, she walks on a daily basis, cognitive function normal, she does have a health care power of attorney and living will   Review of Systems  Constitutional: Negative.   HENT: Negative.   Eyes: Negative.   Respiratory: Negative.   Cardiovascular: Negative.   Gastrointestinal: Negative.   Genitourinary: Negative.   Musculoskeletal: Negative.   Neurological: Negative.   Hematological: Negative.   Psychiatric/Behavioral: Negative.        Objective:   Physical Exam  Constitutional: She appears well-developed and well-nourished.  HENT:  Head: Normocephalic and atraumatic.  Right Ear: External ear normal.  Left Ear: External ear normal.  Nose: Nose normal.  Mouth/Throat: Oropharynx is clear and moist.  Eyes: EOM are normal. Pupils are equal, round, and reactive to light.  Neck: Normal range of motion. Neck supple. No thyromegaly present.  Cardiovascular: Normal rate, regular rhythm, normal heart sounds and intact distal pulses.  Exam reveals no gallop and no friction rub.   No murmur heard. Pulmonary/Chest: Effort normal and breath sounds normal.  Abdominal: Soft. Bowel sounds are normal. She exhibits no distension and no mass. There is no tenderness. There is no rebound.  Genitourinary:       Bilateral breast exam normal  Musculoskeletal: Normal range of  motion.  Lymphadenopathy:    She has no cervical adenopathy.  Neurological: She is alert. She has normal reflexes. No cranial nerve deficit. She exhibits normal muscle tone. Coordination normal.  Skin: Skin is warm and dry.  Psychiatric: She has a normal mood and affect. Her behavior is normal. Judgment and thought content normal.          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

## 2012-11-03 ENCOUNTER — Other Ambulatory Visit: Payer: Self-pay | Admitting: Family Medicine

## 2013-08-13 ENCOUNTER — Telehealth: Payer: Self-pay | Admitting: Family Medicine

## 2013-08-13 MED ORDER — AMOXICILLIN 500 MG PO CAPS: ORAL_CAPSULE | ORAL | Status: DC

## 2013-08-13 NOTE — Telephone Encounter (Signed)
Pt states her dentist asked her to call dr todd and rx an antibiotic for her prior to her dental visit.  Pt having teeth pulled abd had knee replacement done last year 05/30/12. Pharm: [facility]

## 2013-08-13 NOTE — Telephone Encounter (Signed)
Please advise 

## 2013-08-13 NOTE — Telephone Encounter (Signed)
Rx sent to pharmacy.  Patient is aware. 

## 2013-08-13 NOTE — Telephone Encounter (Signed)
Call in Amoxicillin 500 mg to take 2 tabs the night before the procedure and take 2 more the morning of the procedure, #4

## 2013-08-13 NOTE — Addendum Note (Signed)
Addended by: Kern Reap B on: 08/13/2013 01:59 PM   Modules accepted: Orders

## 2013-11-07 ENCOUNTER — Encounter: Payer: Self-pay | Admitting: Family Medicine

## 2013-11-07 ENCOUNTER — Ambulatory Visit (INDEPENDENT_AMBULATORY_CARE_PROVIDER_SITE_OTHER): Payer: Medicare Other | Admitting: Family Medicine

## 2013-11-07 VITALS — BP 132/88 | Temp 98.1°F | Ht 60.0 in | Wt 159.0 lb

## 2013-11-07 DIAGNOSIS — E785 Hyperlipidemia, unspecified: Secondary | ICD-10-CM

## 2013-11-07 DIAGNOSIS — M1990 Unspecified osteoarthritis, unspecified site: Secondary | ICD-10-CM

## 2013-11-07 DIAGNOSIS — Z23 Encounter for immunization: Secondary | ICD-10-CM

## 2013-11-07 DIAGNOSIS — M1711 Unilateral primary osteoarthritis, right knee: Secondary | ICD-10-CM

## 2013-11-07 DIAGNOSIS — M1710 Unilateral primary osteoarthritis, unspecified knee: Secondary | ICD-10-CM

## 2013-11-07 DIAGNOSIS — Z0000 Encounter for general adult medical examination without abnormal findings: Secondary | ICD-10-CM

## 2013-11-07 DIAGNOSIS — I10 Essential (primary) hypertension: Secondary | ICD-10-CM

## 2013-11-07 LAB — CBC WITH DIFFERENTIAL/PLATELET
Eosinophils Relative: 1.9 % (ref 0.0–5.0)
HCT: 39.5 % (ref 36.0–46.0)
Hemoglobin: 13.4 g/dL (ref 12.0–15.0)
Lymphocytes Relative: 30.2 % (ref 12.0–46.0)
Lymphs Abs: 2 10*3/uL (ref 0.7–4.0)
Monocytes Relative: 7.7 % (ref 3.0–12.0)
Neutro Abs: 4 10*3/uL (ref 1.4–7.7)
RBC: 4.75 Mil/uL (ref 3.87–5.11)
WBC: 6.7 10*3/uL (ref 4.5–10.5)

## 2013-11-07 LAB — HEPATIC FUNCTION PANEL
Albumin: 3.7 g/dL (ref 3.5–5.2)
Alkaline Phosphatase: 113 U/L (ref 39–117)

## 2013-11-07 LAB — POCT URINALYSIS DIPSTICK
Blood, UA: NEGATIVE
Glucose, UA: NEGATIVE
Ketones, UA: NEGATIVE
Nitrite, UA: NEGATIVE
Protein, UA: NEGATIVE
Urobilinogen, UA: 0.2
pH, UA: 7

## 2013-11-07 LAB — LIPID PANEL
Cholesterol: 218 mg/dL — ABNORMAL HIGH (ref 0–200)
Total CHOL/HDL Ratio: 4
VLDL: 13.4 mg/dL (ref 0.0–40.0)

## 2013-11-07 LAB — BASIC METABOLIC PANEL
BUN: 21 mg/dL (ref 6–23)
Calcium: 9.9 mg/dL (ref 8.4–10.5)
Creatinine, Ser: 1.1 mg/dL (ref 0.4–1.2)
GFR: 65.21 mL/min (ref >60.00)
Glucose, Bld: 95 mg/dL (ref 70–99)
Sodium: 139 mEq/L (ref 135–145)

## 2013-11-07 LAB — LDL CHOLESTEROL, DIRECT: Direct LDL: 156.9 mg/dL

## 2013-11-07 MED ORDER — POTASSIUM CHLORIDE CRYS ER 20 MEQ PO TBCR: 20.0000 meq | EXTENDED_RELEASE_TABLET | Freq: Two times a day (BID) | ORAL | Status: DC

## 2013-11-07 MED ORDER — ATENOLOL-CHLORTHALIDONE 50-25 MG PO TABS: ORAL_TABLET | ORAL | Status: DC

## 2013-11-07 MED ORDER — SIMVASTATIN 20 MG PO TABS: ORAL_TABLET | ORAL | Status: DC

## 2013-11-07 NOTE — Progress Notes (Signed)
Pre visit review using our clinic review tool, if applicable. No additional management support is needed unless otherwise documented below in the visit note. 

## 2013-11-07 NOTE — Patient Instructions (Signed)
Continue your current medications  Labs today  Followup in one year for your annual exam sooner if any problems

## 2013-11-07 NOTE — Progress Notes (Signed)
   Subjective:    Patient ID: Maria [name], female    DOB: 1941/05/02, 71 y.o.   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,,,,,,,, who comes in for a Medicare wellness examination because of a history of hypertension hyperlipidemia and reflux esophagitis  Her blood pressure is 1:30 to radiate on Tenoretic 5025 daily  She takes Prilosec 20 mg for chronic reflux she's also had a history of a GI bleed  She takes 1 potassium supplement and simvastatin 20 mg daily for hyperlipidemia  She gets routine eye care, dental care, BSE monthly, and you mammography, followup colonoscopy and GI  Cognitive function normal she walks on a regular basis home health safety reviewed no issues identified, no guns in the house, she does have a health care power of attorney and living well  Vaccinations up-to-date   Review of Systems  Endocrine: Negative.   Allergic/Immunologic: Negative.   Neurological: Negative.   Hematological: Negative.        Objective:   Physical Exam  Nursing note and vitals reviewed. Constitutional: She appears well-developed and well-nourished.  HENT:  Head: Normocephalic and atraumatic.  Right Ear: External ear normal.  Left Ear: External ear normal.  Nose: Nose normal.  Mouth/Throat: Oropharynx is clear and moist.  Eyes: EOM are normal. Pupils are equal, round, and reactive to light.  Neck: Normal range of motion. Neck supple. No thyromegaly present.  Cardiovascular: Normal rate, regular rhythm, normal heart sounds and intact distal pulses.  Exam reveals no gallop and no friction rub.   No murmur heard. No carotid or aortic bruits peripheral pulses 2+ and symmetrical  Pulmonary/Chest: Effort normal and breath sounds normal.  Abdominal: Soft. Bowel sounds are normal. She exhibits no distension and no mass. There is no tenderness. There is no rebound.  Genitourinary: Vagina normal and uterus normal. Guaiac  negative stool. No vaginal discharge found.  Musculoskeletal: Normal range of motion.  A scar right knee well-healed previous total knee replacement in June 2014 Dr. Eulah Pont  Lymphadenopathy:    She has no cervical adenopathy.  Neurological: She is alert. She has normal reflexes. No cranial nerve deficit. She exhibits normal muscle tone. Coordination normal.  Skin: Skin is warm and dry.  Psychiatric: She has a normal mood and affect. Her behavior is normal. Judgment and thought content normal.          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

## 2013-11-16 ENCOUNTER — Other Ambulatory Visit: Payer: Self-pay | Admitting: Family Medicine

## 2013-12-04 ENCOUNTER — Other Ambulatory Visit: Payer: Self-pay | Admitting: Family Medicine

## 2014-02-27 ENCOUNTER — Encounter: Payer: Self-pay | Admitting: Family Medicine

## 2014-02-27 ENCOUNTER — Ambulatory Visit (INDEPENDENT_AMBULATORY_CARE_PROVIDER_SITE_OTHER): Payer: Commercial Managed Care - HMO | Admitting: Family Medicine

## 2014-02-27 VITALS — BP 124/80 | Temp 98.5°F | Wt 159.0 lb

## 2014-02-27 DIAGNOSIS — L259 Unspecified contact dermatitis, unspecified cause: Secondary | ICD-10-CM | POA: Insufficient documentation

## 2014-02-27 NOTE — Progress Notes (Signed)
   Subjective:    Patient ID: Maria [name], female    DOB: 1941-06-03, 73 y.o.   MRN: [id number]  HPI [name] is a 73 year old female who comes in today for evaluation the rash on her left hand  She was taking a shower today and noticed rash very. A counter left hand is and came in immediately for evaluation!!!!!!!!!    Review of Systems    negative except for history of allergic rhinitis Objective:   Physical Exam  Well-developed well-nourished female no acute distress vital signs stable she's afebrile examination skin shows that he is elevated papular rash on her left hand consistent with a contact dermatitis      Assessment & Plan:  Contact dermatitis,,,,,,, OTC steroid cream

## 2014-02-27 NOTE — Patient Instructions (Signed)
Apply over-the-counter steroid cream,,,,,,,, cortaid........... small amounts twice daily  Return.

## 2014-02-27 NOTE — Progress Notes (Signed)
Pre visit review using our clinic review tool, if applicable. No additional management support is needed unless otherwise documented below in the visit note. 

## 2014-04-21 ENCOUNTER — Other Ambulatory Visit: Payer: Self-pay | Admitting: Physician Assistant

## 2014-04-21 NOTE — H&P (Signed)
TOTAL KNEE ADMISSION H&P  Patient is being admitted for left total knee arthroplasty.  Subjective:  Chief Complaint:left knee pain.  HPI: Maria [name], 73 y.o. female, has a history of pain and functional disability in the left knee due to arthritis and has failed non-surgical conservative treatments for greater than 12 weeks to includeNSAID's and/or analgesics, corticosteriod injections, viscosupplementation injections, use of assistive devices and activity modification.  Onset of symptoms was gradual, starting >10 years ago with gradually worsening course since that time. The patient noted no past surgery on the left knee(s).  Patient currently rates pain in the left knee(s) at 7 out of 10 with activity. Patient has night pain, worsening of pain with activity and weight bearing, pain that interferes with activities of daily living, pain with passive range of motion and joint swelling.  Patient has evidence of subchondral sclerosis and joint space narrowing by imaging studies. There is no active infection.  Patient Active Problem List   Diagnosis Date Noted  . Contact dermatitis 02/27/2014  . Osteoarthritis of right knee 06/03/2012  . Asthma exacerbation, mild 11/14/2011  . OSTEOARTHROSIS, UNSPECIFIED SITE 09/06/2010  . VIRAL URI 08/20/2009  . HYPERLIPIDEMIA 08/21/2008  . HYPERTENSION 08/21/2008   Past Medical History  Diagnosis Date  . Hyperlipidemia   . Hypertension   . Peripheral vascular disease   . History of gastrointestinal hemorrhage   . GERD (gastroesophageal reflux disease)   . Bronchitis     hx of  . Arthritis   . History of bleeding ulcers   . Osteoarthritis of right knee 06/03/2012    Past Surgical History  Procedure Laterality Date  . Abdominal hysterectomy      nonmalignant reasons  . Total knee arthroplasty  05/30/2012    Procedure: TOTAL KNEE ARTHROPLASTY;  Surgeon: Loreta Ave, MD;  Location: [facility] OR;  Service: Orthopedics;  Laterality: Right;  DR MURPHY  WANTS 90 MINUTES FOR THIS CASE. OSTEONICS     (Not in a hospital admission) No Known Allergies  History  Substance Use Topics  . Smoking status: Never Smoker   . Smokeless tobacco: Not on file  . Alcohol Use: No    Family History  Problem Relation Age of Onset  . Heart disease Mother   . Other Father     mm other     Review of Systems  Constitutional: Negative.   HENT: Negative.   Eyes: Negative.   Respiratory: Negative.   Cardiovascular: Negative.   Gastrointestinal: Negative.   Genitourinary: Negative.   Musculoskeletal: Positive for joint pain.  Skin: Negative.   Neurological: Negative.   Endo/Heme/Allergies: Negative.   Psychiatric/Behavioral: Negative.     Objective:  Physical Exam  Constitutional: She is oriented to person, place, and time. She appears well-developed and well-nourished.  HENT:  Head: Normocephalic and atraumatic.  Eyes: EOM are normal. Pupils are equal, round, and reactive to light.  Neck: Normal range of motion. Neck supple.  Cardiovascular: Normal rate and regular rhythm.  Exam reveals no gallop and no friction rub.   No murmur heard. Respiratory: Effort normal and breath sounds normal. No respiratory distress. She has no wheezes. She has no rales.  GI: Soft. Bowel sounds are normal. She exhibits no distension. There is no tenderness.  Musculoskeletal:  Alert and oriented x 3.  She has an antalgic gait with a Trendelenburg component on the left and is having to use the assistance of a cane with ambulation.  Range of motion is from about 5-120 degrees.  No apparent valgus or varus stress.  Positive patellofemoral crepitus.  Tenderness to palpation over the medial joint line.  Ligaments are stable.      Neurological: She is alert and oriented to person, place, and time.  Skin: Skin is warm and dry.  Psychiatric: She has a normal mood and affect. Her behavior is normal. Judgment and thought content normal.    Vital signs in last 24  hours: @VSRANGES@  Labs:   Estimated body mass index is 31.05 kg/(m^2) as calculated from the following:   Height as of 11/07/13: 5' (1.524 m).   Weight as of 02/27/14: 72.122 kg (159 lb).   Imaging Review Plain radiographs demonstrate severe degenerative joint disease of the left knee(s). The overall alignment isneutral. The bone quality appears to be fair for age and reported activity level.  Assessment/Plan:  End stage arthritis, left knee   The patient history, physical examination, clinical judgment of the provider and imaging studies are consistent with end stage degenerative joint disease of the left knee(s) and total knee arthroplasty is deemed medically necessary. The treatment options including medical management, injection therapy arthroscopy and arthroplasty were discussed at length. The risks and benefits of total knee arthroplasty were presented and reviewed. The risks due to aseptic loosening, infection, stiffness, patella tracking problems, thromboembolic complications and other imponderables were discussed. The patient acknowledged the explanation, agreed to proceed with the plan and consent was signed. Patient is being admitted for inpatient treatment for surgery, pain control, PT, OT, prophylactic antibiotics, VTE prophylaxis, progressive ambulation and ADL's and discharge planning. The patient is planning to be discharged to skilled nursing facility   

## 2014-05-06 ENCOUNTER — Encounter (HOSPITAL_COMMUNITY): Payer: Self-pay

## 2014-05-06 ENCOUNTER — Other Ambulatory Visit (HOSPITAL_COMMUNITY): Payer: Self-pay | Admitting: *Deleted

## 2014-05-06 ENCOUNTER — Encounter (HOSPITAL_COMMUNITY)
Admission: RE | Admit: 2014-05-06 | Discharge: 2014-05-06 | Disposition: A | Discharge status: Home / Self Care | Payer: Medicare PPO | Source: Ambulatory Visit | Attending: Orthopedic Surgery | Admitting: Orthopedic Surgery

## 2014-05-06 ENCOUNTER — Encounter (HOSPITAL_COMMUNITY)
Admission: RE | Admit: 2014-05-06 | Discharge: 2014-05-06 | Disposition: A | Discharge status: Home / Self Care | Payer: Medicare PPO | Source: Ambulatory Visit | Attending: Physician Assistant | Admitting: Physician Assistant

## 2014-05-06 DIAGNOSIS — Z01818 Encounter for other preprocedural examination: Secondary | ICD-10-CM | POA: Insufficient documentation

## 2014-05-06 DIAGNOSIS — Z01812 Encounter for preprocedural laboratory examination: Secondary | ICD-10-CM | POA: Insufficient documentation

## 2014-05-06 DIAGNOSIS — I10 Essential (primary) hypertension: Secondary | ICD-10-CM | POA: Insufficient documentation

## 2014-05-06 LAB — PROTIME-INR
INR: 1 (ref 0.00–1.49)
PROTHROMBIN TIME: 13 s (ref 11.6–15.2)

## 2014-05-06 LAB — CBC WITH DIFFERENTIAL/PLATELET
BASOS ABS: 0 10*3/uL (ref 0.0–0.1)
BASOS PCT: 1 % (ref 0–1)
EOS ABS: 0.1 10*3/uL (ref 0.0–0.7)
Eosinophils Relative: 2 % (ref 0–5)
HCT: 37.4 % (ref 36.0–46.0)
Hemoglobin: 12.5 g/dL (ref 12.0–15.0)
Lymphocytes Relative: 33 % (ref 12–46)
Lymphs Abs: 1.7 10*3/uL (ref 0.7–4.0)
MCH: 28 pg (ref 26.0–34.0)
MCHC: 33.4 g/dL (ref 30.0–36.0)
MCV: 83.9 fL (ref 78.0–100.0)
Monocytes Absolute: 0.4 10*3/uL (ref 0.1–1.0)
Monocytes Relative: 7 % (ref 3–12)
Neutro Abs: 3.1 10*3/uL (ref 1.7–7.7)
Neutrophils Relative %: 59 % (ref 43–77)
Platelets: 243 10*3/uL (ref 150–400)
RBC: 4.46 MIL/uL (ref 3.87–5.11)
RDW: 13.5 % (ref 11.5–15.5)
WBC: 5.4 10*3/uL (ref 4.0–10.5)

## 2014-05-06 LAB — URINALYSIS, ROUTINE W REFLEX MICROSCOPIC
BILIRUBIN URINE: NEGATIVE
Glucose, UA: NEGATIVE mg/dL
Hgb urine dipstick: NEGATIVE
Ketones, ur: NEGATIVE mg/dL
Leukocytes, UA: NEGATIVE
NITRITE: NEGATIVE
Protein, ur: NEGATIVE mg/dL
Specific Gravity, Urine: 1.015 (ref 1.005–1.030)
UROBILINOGEN UA: 0.2 mg/dL (ref 0.0–1.0)
pH: 5 (ref 5.0–8.0)

## 2014-05-06 LAB — COMPREHENSIVE METABOLIC PANEL
ALT: 11 U/L (ref 0–35)
AST: 16 U/L (ref 0–37)
Albumin: 3.4 g/dL — ABNORMAL LOW (ref 3.5–5.2)
Alkaline Phosphatase: 130 U/L — ABNORMAL HIGH (ref 39–117)
BUN: 18 mg/dL (ref 6–23)
CO2: 28 mEq/L (ref 19–32)
Calcium: 10 mg/dL (ref 8.4–10.5)
Chloride: 103 mEq/L (ref 96–112)
Creatinine, Ser: 0.96 mg/dL (ref 0.50–1.10)
GFR calc Af Amer: 66 mL/min — ABNORMAL LOW (ref >90)
GFR calc non Af Amer: 57 mL/min — ABNORMAL LOW (ref >90)
Glucose, Bld: 86 mg/dL (ref 70–99)
POTASSIUM: 4.5 meq/L (ref 3.7–5.3)
Sodium: 140 mEq/L (ref 137–147)
Total Bilirubin: 0.2 mg/dL — ABNORMAL LOW (ref 0.3–1.2)
Total Protein: 7.4 g/dL (ref 6.0–8.3)

## 2014-05-06 LAB — TYPE AND SCREEN
ABO/RH(D): O POS
ANTIBODY SCREEN: NEGATIVE

## 2014-05-06 LAB — APTT: aPTT: 32 seconds (ref 24–37)

## 2014-05-06 LAB — SURGICAL PCR SCREEN
MRSA, PCR: NEGATIVE
Staphylococcus aureus: POSITIVE — AB

## 2014-05-06 MED ORDER — CHLORHEXIDINE GLUCONATE 4 % EX LIQD: 60.0000 mL | Freq: Once | CUTANEOUS | Status: DC

## 2014-05-06 NOTE — Progress Notes (Signed)
Patient made aware that nasal swab was positive for staph and that a script was called to her pharmacy at [phone].

## 2014-05-06 NOTE — Pre-Procedure Instructions (Signed)
Maria [name]  05/06/2014   Your procedure is scheduled on:  Wednesday, May 14, 2014 at 11:00 AM.   [facility] at 8:00 AM.   Call this number if you have problems the morning of surgery: [phone]   Remember:   Do not eat food or drink liquids after midnight Tuesday, 05/13/14.   Take these medicines the morning of surgery with A SIP OF WATER: Prilosec Stop Aspirin 05/07/14 .  Do not wear jewelry, make-up or nail polish.  Do not wear lotions, powders, or perfumes. You may wear deodorant.  Do not shave 48 hours prior to surgery.    Do not bring valuables to the hospital.  Maria [name] is not responsible                  for any belongings or valuables.               Contacts, dentures or bridgework may not be worn into surgery.  Leave suitcase in the car. After surgery it may be brought to your room.  For patients admitted to the hospital, discharge time is determined by your                treatment team.   Special Instructions: Maria [name] - Preparing for Surgery  Before surgery, you can play an important role.  Because skin is not sterile, your skin needs to be as free of germs as possible.  You can reduce the number of germs on you skin by washing with CHG (chlorahexidine gluconate) soap before surgery.  CHG is an antiseptic cleaner which kills germs and bonds with the skin to continue killing germs even after washing.  Please DO NOT use if you have an allergy to CHG or antibacterial soaps.  If your skin becomes reddened/irritated stop using the CHG and inform your nurse when you arrive at Short Stay.  Do not shave (including legs and underarms) for at least 48 hours prior to the first CHG shower.  You may shave your face.  Please follow these instructions carefully:   1.  Shower with CHG Soap the night before surgery and the                                morning of Surgery.  2.  If you choose to wash your hair, wash your hair  first as usual with your       normal shampoo.  3.  After you shampoo, rinse your hair and body thoroughly to remove the                      Shampoo.  4.  Use CHG as you would any other liquid soap.  You can apply chg directly       to the skin and wash gently with scrungie or a clean washcloth.  5.  Apply the CHG Soap to your body ONLY FROM THE NECK DOWN.        Do not use on open wounds or open sores.  Avoid contact with your eyes, ears, mouth and genitals (private parts).  Wash genitals (private parts) with your normal soap.  6.  Wash thoroughly, paying special attention to the area where your surgery        will be performed.  7.  Thoroughly rinse your body with warm water from the neck  down.  8.  DO NOT shower/wash with your normal soap after using and rinsing off       the CHG Soap.  9.  Pat yourself dry with a clean towel.            10.  Wear clean pajamas.            11.  Place clean sheets on your bed the night of your first shower and do not        sleep with pets.  Day of Surgery  Do not apply any lotions the morning of surgery.  Please wear clean clothes to the hospital/surgery center.     Please read over the following fact sheets that you were given: Pain Booklet, Coughing and Deep Breathing, Blood Transfusion Information, MRSA Information and Surgical Site Infection Prevention

## 2014-05-07 LAB — URINE CULTURE
Colony Count: NO GROWTH
Culture: NO GROWTH

## 2014-05-13 MED ORDER — CEFAZOLIN SODIUM-DEXTROSE 2-3 GM-% IV SOLR
2.0000 g | INTRAVENOUS | Status: AC
  Administered 2014-05-14: 2 g via INTRAVENOUS
  Filled 2014-05-13: qty 50

## 2014-05-13 NOTE — Progress Notes (Signed)
Pt. Notified of time change ,to arrive at 0700. Pt.voices understanding.. 

## 2014-05-14 ENCOUNTER — Inpatient Hospital Stay (HOSPITAL_COMMUNITY): Payer: Medicare PPO | Admitting: Anesthesiology

## 2014-05-14 ENCOUNTER — Inpatient Hospital Stay (HOSPITAL_COMMUNITY): Payer: Medicare PPO

## 2014-05-14 ENCOUNTER — Encounter (HOSPITAL_COMMUNITY): Payer: Medicare PPO | Admitting: Anesthesiology

## 2014-05-14 ENCOUNTER — Encounter (HOSPITAL_COMMUNITY)
Admission: RE | Disposition: A | Discharge status: Skilled Nursing Facility | Payer: Self-pay | Source: Ambulatory Visit | Attending: Orthopedic Surgery

## 2014-05-14 ENCOUNTER — Inpatient Hospital Stay (HOSPITAL_COMMUNITY)
Admission: RE | Admit: 2014-05-14 | Discharge: 2014-05-17 | DRG: 470 | Disposition: A | Discharge status: Skilled Nursing Facility | Payer: Medicare PPO | Source: Ambulatory Visit | Attending: Orthopedic Surgery | Admitting: Orthopedic Surgery

## 2014-05-14 DIAGNOSIS — I739 Peripheral vascular disease, unspecified: Secondary | ICD-10-CM | POA: Diagnosis present

## 2014-05-14 DIAGNOSIS — Z96659 Presence of unspecified artificial knee joint: Secondary | ICD-10-CM

## 2014-05-14 DIAGNOSIS — E785 Hyperlipidemia, unspecified: Secondary | ICD-10-CM | POA: Diagnosis present

## 2014-05-14 DIAGNOSIS — J45909 Unspecified asthma, uncomplicated: Secondary | ICD-10-CM | POA: Diagnosis present

## 2014-05-14 DIAGNOSIS — M1710 Unilateral primary osteoarthritis, unspecified knee: Principal | ICD-10-CM | POA: Diagnosis present

## 2014-05-14 DIAGNOSIS — M179 Osteoarthritis of knee, unspecified: Secondary | ICD-10-CM

## 2014-05-14 DIAGNOSIS — I10 Essential (primary) hypertension: Secondary | ICD-10-CM | POA: Diagnosis present

## 2014-05-14 DIAGNOSIS — K219 Gastro-esophageal reflux disease without esophagitis: Secondary | ICD-10-CM | POA: Diagnosis present

## 2014-05-14 HISTORY — PX: TOTAL KNEE ARTHROPLASTY: SHX125

## 2014-05-14 SURGERY — ARTHROPLASTY, KNEE, TOTAL
Anesthesia: Monitor Anesthesia Care | Site: Knee | Laterality: Left

## 2014-05-14 MED ORDER — PROPOFOL INFUSION 10 MG/ML OPTIME
INTRAVENOUS | Status: DC | PRN
  Administered 2014-05-14: 25 ug/kg/min via INTRAVENOUS

## 2014-05-14 MED ORDER — METHOCARBAMOL 1000 MG/10ML IJ SOLN
500.0000 mg | Freq: Four times a day (QID) | INTRAVENOUS | Status: DC | PRN
  Filled 2014-05-14: qty 5

## 2014-05-14 MED ORDER — ASPIRIN EC 325 MG PO TBEC
325.0000 mg | DELAYED_RELEASE_TABLET | Freq: Every day | ORAL | Status: DC
  Administered 2014-05-15 – 2014-05-17 (×3): 325 mg via ORAL
  Filled 2014-05-14 (×4): qty 1

## 2014-05-14 MED ORDER — ACETAMINOPHEN 325 MG PO TABS: 325.0000 mg | ORAL_TABLET | ORAL | Status: DC | PRN

## 2014-05-14 MED ORDER — ACETAMINOPHEN 160 MG/5ML PO SOLN
325.0000 mg | ORAL | Status: DC | PRN
  Filled 2014-05-14: qty 20.3

## 2014-05-14 MED ORDER — LACTATED RINGERS IV SOLN
INTRAVENOUS | Status: DC
  Administered 2014-05-14: 08:00:00 via INTRAVENOUS

## 2014-05-14 MED ORDER — POTASSIUM CHLORIDE IN NACL 20-0.9 MEQ/L-% IV SOLN
INTRAVENOUS | Status: DC
  Administered 2014-05-14 – 2014-05-15 (×2): via INTRAVENOUS
  Filled 2014-05-14 (×5): qty 1000

## 2014-05-14 MED ORDER — LACTATED RINGERS IV SOLN: INTRAVENOUS | Status: DC

## 2014-05-14 MED ORDER — BUPIVACAINE HCL (PF) 0.25 % IJ SOLN
INTRAMUSCULAR | Status: DC | PRN
  Administered 2014-05-14: 10 mL

## 2014-05-14 MED ORDER — BUPIVACAINE HCL (PF) 0.25 % IJ SOLN
INTRAMUSCULAR | Status: AC
  Filled 2014-05-14: qty 30

## 2014-05-14 MED ORDER — DIPHENHYDRAMINE HCL 12.5 MG/5ML PO ELIX: 12.5000 mg | ORAL_SOLUTION | ORAL | Status: DC | PRN

## 2014-05-14 MED ORDER — BUPIVACAINE LIPOSOME 1.3 % IJ SUSP
20.0000 mL | Freq: Once | INTRAMUSCULAR | Status: DC
  Filled 2014-05-14: qty 20

## 2014-05-14 MED ORDER — ATENOLOL-CHLORTHALIDONE 50-25 MG PO TABS: 0.5000 | ORAL_TABLET | Freq: Every day | ORAL | Status: DC

## 2014-05-14 MED ORDER — FENTANYL CITRATE 0.05 MG/ML IJ SOLN
INTRAMUSCULAR | Status: DC | PRN
  Administered 2014-05-14: 50 ug via INTRAVENOUS
  Administered 2014-05-14 (×2): 25 ug via INTRAVENOUS

## 2014-05-14 MED ORDER — ASPIRIN EC 325 MG PO TBEC: 325.0000 mg | DELAYED_RELEASE_TABLET | Freq: Every day | ORAL | Status: DC

## 2014-05-14 MED ORDER — BISACODYL 5 MG PO TBEC: 5.0000 mg | DELAYED_RELEASE_TABLET | Freq: Every day | ORAL | Status: DC | PRN

## 2014-05-14 MED ORDER — MENTHOL 3 MG MT LOZG: 1.0000 | LOZENGE | OROMUCOSAL | Status: DC | PRN

## 2014-05-14 MED ORDER — FENTANYL CITRATE 0.05 MG/ML IJ SOLN: 25.0000 ug | INTRAMUSCULAR | Status: DC | PRN

## 2014-05-14 MED ORDER — ONDANSETRON HCL 4 MG/2ML IJ SOLN
4.0000 mg | Freq: Four times a day (QID) | INTRAMUSCULAR | Status: DC | PRN
Start: 2014-05-14 — End: 2014-05-17

## 2014-05-14 MED ORDER — BUPIVACAINE LIPOSOME 1.3 % IJ SUSP
INTRAMUSCULAR | Status: DC | PRN
  Administered 2014-05-14: 20 mL

## 2014-05-14 MED ORDER — CHLORHEXIDINE GLUCONATE 4 % EX LIQD
60.0000 mL | Freq: Once | CUTANEOUS | Status: DC
  Filled 2014-05-14: qty 60

## 2014-05-14 MED ORDER — SODIUM CHLORIDE 0.9 % IR SOLN
Status: DC | PRN
  Administered 2014-05-14: 1000 mL

## 2014-05-14 MED ORDER — POTASSIUM CHLORIDE CRYS ER 20 MEQ PO TBCR
20.0000 meq | EXTENDED_RELEASE_TABLET | Freq: Two times a day (BID) | ORAL | Status: DC
  Administered 2014-05-14 – 2014-05-17 (×6): 20 meq via ORAL
  Filled 2014-05-14 (×7): qty 1

## 2014-05-14 MED ORDER — ACETAMINOPHEN 325 MG PO TABS
650.0000 mg | ORAL_TABLET | Freq: Four times a day (QID) | ORAL | Status: DC | PRN
  Administered 2014-05-15 (×2): 650 mg via ORAL
  Filled 2014-05-14 (×2): qty 2

## 2014-05-14 MED ORDER — FENTANYL CITRATE 0.05 MG/ML IJ SOLN
INTRAMUSCULAR | Status: AC
  Filled 2014-05-14: qty 5

## 2014-05-14 MED ORDER — PROPOFOL 10 MG/ML IV BOLUS
INTRAVENOUS | Status: AC
Start: 2014-05-14 — End: 2014-05-14
  Filled 2014-05-14: qty 20

## 2014-05-14 MED ORDER — CHLORTHALIDONE 25 MG PO TABS
12.5000 mg | ORAL_TABLET | Freq: Every day | ORAL | Status: DC
  Administered 2014-05-15: 12.5 mg via ORAL
  Administered 2014-05-16: 11:00:00 via ORAL
  Administered 2014-05-17: 12.5 mg via ORAL
  Filled 2014-05-14 (×4): qty 0.5

## 2014-05-14 MED ORDER — METHOCARBAMOL 500 MG PO TABS: 500.0000 mg | ORAL_TABLET | Freq: Four times a day (QID) | ORAL | Status: DC

## 2014-05-14 MED ORDER — DEXAMETHASONE SODIUM PHOSPHATE 10 MG/ML IJ SOLN
10.0000 mg | Freq: Three times a day (TID) | INTRAMUSCULAR | Status: AC
  Filled 2014-05-14 (×4): qty 1

## 2014-05-14 MED ORDER — METOCLOPRAMIDE HCL 5 MG/ML IJ SOLN: 5.0000 mg | Freq: Three times a day (TID) | INTRAMUSCULAR | Status: DC | PRN

## 2014-05-14 MED ORDER — ONDANSETRON HCL 4 MG/2ML IJ SOLN
INTRAMUSCULAR | Status: AC
  Filled 2014-05-14: qty 2

## 2014-05-14 MED ORDER — MIDAZOLAM HCL 5 MG/5ML IJ SOLN
INTRAMUSCULAR | Status: DC | PRN
  Administered 2014-05-14 (×2): 1 mg via INTRAVENOUS

## 2014-05-14 MED ORDER — METHOCARBAMOL 500 MG PO TABS
500.0000 mg | ORAL_TABLET | Freq: Four times a day (QID) | ORAL | Status: DC | PRN
Start: 2014-05-14 — End: 2014-05-17
  Administered 2014-05-14: 500 mg via ORAL
  Filled 2014-05-14: qty 1

## 2014-05-14 MED ORDER — PROMETHAZINE HCL 25 MG/ML IJ SOLN: 6.2500 mg | INTRAMUSCULAR | Status: DC | PRN

## 2014-05-14 MED ORDER — HYDROMORPHONE HCL 2 MG PO TABS: 2.0000 mg | ORAL_TABLET | ORAL | Status: DC | PRN

## 2014-05-14 MED ORDER — PANTOPRAZOLE SODIUM 40 MG PO TBEC
40.0000 mg | DELAYED_RELEASE_TABLET | Freq: Every day | ORAL | Status: DC
Start: 2014-05-15 — End: 2014-05-17
  Administered 2014-05-15 – 2014-05-17 (×3): 40 mg via ORAL
  Filled 2014-05-14 (×3): qty 1

## 2014-05-14 MED ORDER — SIMVASTATIN 20 MG PO TABS
20.0000 mg | ORAL_TABLET | ORAL | Status: DC
  Administered 2014-05-14 – 2014-05-16 (×2): 20 mg via ORAL
  Filled 2014-05-14 (×2): qty 1

## 2014-05-14 MED ORDER — CEFAZOLIN SODIUM-DEXTROSE 2-3 GM-% IV SOLR
2.0000 g | Freq: Four times a day (QID) | INTRAVENOUS | Status: AC
  Administered 2014-05-14 (×2): 2 g via INTRAVENOUS
  Filled 2014-05-14 (×2): qty 50

## 2014-05-14 MED ORDER — PHENOL 1.4 % MT LIQD
1.0000 | OROMUCOSAL | Status: DC | PRN
Start: 2014-05-14 — End: 2014-05-17

## 2014-05-14 MED ORDER — ARTIFICIAL TEARS OP OINT
TOPICAL_OINTMENT | OPHTHALMIC | Status: AC
  Filled 2014-05-14: qty 3.5

## 2014-05-14 MED ORDER — ONDANSETRON HCL 4 MG PO TABS: 4.0000 mg | ORAL_TABLET | Freq: Four times a day (QID) | ORAL | Status: DC | PRN

## 2014-05-14 MED ORDER — OXYCODONE HCL 5 MG/5ML PO SOLN: 5.0000 mg | Freq: Once | ORAL | Status: DC | PRN

## 2014-05-14 MED ORDER — DEXAMETHASONE 6 MG PO TABS
10.0000 mg | ORAL_TABLET | Freq: Three times a day (TID) | ORAL | Status: AC
  Administered 2014-05-14 – 2014-05-15 (×3): 10 mg via ORAL
  Filled 2014-05-14 (×3): qty 1

## 2014-05-14 MED ORDER — ATENOLOL 25 MG PO TABS
25.0000 mg | ORAL_TABLET | Freq: Once | ORAL | Status: AC
  Administered 2014-05-14: 25 mg via ORAL
  Filled 2014-05-14: qty 1

## 2014-05-14 MED ORDER — OXYCODONE HCL 5 MG PO TABS: 5.0000 mg | ORAL_TABLET | Freq: Once | ORAL | Status: DC | PRN

## 2014-05-14 MED ORDER — ONDANSETRON HCL 4 MG PO TABS: 4.0000 mg | ORAL_TABLET | Freq: Three times a day (TID) | ORAL | Status: DC | PRN

## 2014-05-14 MED ORDER — HYDROMORPHONE HCL PF 1 MG/ML IJ SOLN
0.5000 mg | INTRAMUSCULAR | Status: DC | PRN
  Administered 2014-05-14: 1 mg via INTRAVENOUS
  Filled 2014-05-14: qty 1

## 2014-05-14 MED ORDER — ROCURONIUM BROMIDE 50 MG/5ML IV SOLN
INTRAVENOUS | Status: AC
Start: 2014-05-14 — End: 2014-05-14
  Filled 2014-05-14: qty 1

## 2014-05-14 MED ORDER — METOCLOPRAMIDE HCL 10 MG PO TABS: 5.0000 mg | ORAL_TABLET | Freq: Three times a day (TID) | ORAL | Status: DC | PRN

## 2014-05-14 MED ORDER — ACETAMINOPHEN 650 MG RE SUPP
650.0000 mg | Freq: Four times a day (QID) | RECTAL | Status: DC | PRN
Start: 2014-05-14 — End: 2014-05-17

## 2014-05-14 MED ORDER — CELECOXIB 200 MG PO CAPS
200.0000 mg | ORAL_CAPSULE | Freq: Two times a day (BID) | ORAL | Status: DC
  Administered 2014-05-14 – 2014-05-17 (×7): 200 mg via ORAL
  Filled 2014-05-14 (×9): qty 1

## 2014-05-14 MED ORDER — DOCUSATE SODIUM 100 MG PO CAPS
100.0000 mg | ORAL_CAPSULE | Freq: Two times a day (BID) | ORAL | Status: DC
  Administered 2014-05-14 – 2014-05-17 (×6): 100 mg via ORAL
  Filled 2014-05-14 (×8): qty 1

## 2014-05-14 MED ORDER — MIDAZOLAM HCL 2 MG/2ML IJ SOLN
INTRAMUSCULAR | Status: AC
  Filled 2014-05-14: qty 2

## 2014-05-14 MED ORDER — ATENOLOL 25 MG PO TABS
25.0000 mg | ORAL_TABLET | Freq: Every day | ORAL | Status: DC
  Administered 2014-05-15 – 2014-05-17 (×3): 25 mg via ORAL
  Filled 2014-05-14 (×4): qty 1

## 2014-05-14 MED ORDER — BUPIVACAINE IN DEXTROSE 0.75-8.25 % IT SOLN
INTRATHECAL | Status: DC | PRN
  Administered 2014-05-14: 1.7 mL via INTRATHECAL

## 2014-05-14 SURGICAL SUPPLY — 64 items
APL SKNCLS STERI-STRIP NONHPOA (GAUZE/BANDAGES/DRESSINGS) ×1
BANDAGE ESMARK 6X9 LF (GAUZE/BANDAGES/DRESSINGS) ×1 IMPLANT
BENZOIN TINCTURE PRP APPL 2/3 (GAUZE/BANDAGES/DRESSINGS) ×3 IMPLANT
BLADE SAG 18X100X1.27 (BLADE) ×6 IMPLANT
BNDG CMPR 9X6 STRL LF SNTH (GAUZE/BANDAGES/DRESSINGS) ×1
BNDG ESMARK 6X9 LF (GAUZE/BANDAGES/DRESSINGS) ×3
BOWL SMART MIX CTS (DISPOSABLE) ×3 IMPLANT
CEMENT BONE SIMPLEX SPEEDSET (Cement) ×6 IMPLANT
CLOSURE WOUND 1/2 X4 (GAUZE/BANDAGES/DRESSINGS) ×2
COVER SURGICAL LIGHT HANDLE (MISCELLANEOUS) ×3 IMPLANT
CUFF TOURNIQUET SINGLE 34IN LL (TOURNIQUET CUFF) ×3 IMPLANT
DRAPE EXTREMITY T 121X128X90 (DRAPE) ×3 IMPLANT
DRAPE PROXIMA HALF (DRAPES) ×3 IMPLANT
DRAPE U-SHAPE 47X51 STRL (DRAPES) ×3 IMPLANT
DRSG PAD ABDOMINAL 8X10 ST (GAUZE/BANDAGES/DRESSINGS) ×3 IMPLANT
DURAPREP 26ML APPLICATOR (WOUND CARE) ×6 IMPLANT
ELECT CAUTERY BLADE 6.4 (BLADE) ×3 IMPLANT
ELECT REM PT RETURN 9FT ADLT (ELECTROSURGICAL) ×3
ELECTRODE REM PT RTRN 9FT ADLT (ELECTROSURGICAL) ×1 IMPLANT
EVACUATOR 1/8 PVC DRAIN (DRAIN) ×3 IMPLANT
FACESHIELD WRAPAROUND (MASK) ×6 IMPLANT
FACESHIELD WRAPAROUND OR TEAM (MASK) ×2 IMPLANT
GLOVE BIOGEL PI IND STRL 7.0 (GLOVE) ×2 IMPLANT
GLOVE BIOGEL PI INDICATOR 7.0 (GLOVE) ×4
GLOVE ECLIPSE 6.5 STRL STRAW (GLOVE) ×6 IMPLANT
GLOVE ORTHO TXT STRL SZ7.5 (GLOVE) ×3 IMPLANT
GOWN STRL REUS W/ TWL LRG LVL3 (GOWN DISPOSABLE) ×1 IMPLANT
GOWN STRL REUS W/ TWL XL LVL3 (GOWN DISPOSABLE) ×1 IMPLANT
GOWN STRL REUS W/TWL LRG LVL3 (GOWN DISPOSABLE) ×3
GOWN STRL REUS W/TWL XL LVL3 (GOWN DISPOSABLE) ×3
HANDPIECE INTERPULSE COAX TIP (DISPOSABLE) ×3
IMMOBILIZER KNEE 22 UNIV (SOFTGOODS) ×3 IMPLANT
IMMOBILIZER KNEE 24 THIGH 36 (MISCELLANEOUS) IMPLANT
IMMOBILIZER KNEE 24 UNIV (MISCELLANEOUS)
KIT BASIN OR (CUSTOM PROCEDURE TRAY) ×3 IMPLANT
KIT ROOM TURNOVER OR (KITS) ×3 IMPLANT
KNEE/VIT E POLY LINER LEVEL 1B ×2 IMPLANT
MANIFOLD NEPTUNE II (INSTRUMENTS) ×3 IMPLANT
NDL 18GX1X1/2 (RX/OR ONLY) (NEEDLE) ×1 IMPLANT
NDL 25GX 5/8IN NON SAFETY (NEEDLE) ×1 IMPLANT
NEEDLE 18GX1X1/2 (RX/OR ONLY) (NEEDLE) ×3 IMPLANT
NEEDLE 25GX 5/8IN NON SAFETY (NEEDLE) ×3 IMPLANT
NS IRRIG 1000ML POUR BTL (IV SOLUTION) ×3 IMPLANT
PACK TOTAL JOINT (CUSTOM PROCEDURE TRAY) ×3 IMPLANT
PAD ARMBOARD 7.5X6 YLW CONV (MISCELLANEOUS) ×6 IMPLANT
PAD CAST 4YDX4 CTTN HI CHSV (CAST SUPPLIES) ×1 IMPLANT
PADDING CAST COTTON 4X4 STRL (CAST SUPPLIES) ×3
PADDING CAST COTTON 6X4 STRL (CAST SUPPLIES) ×3 IMPLANT
SET HNDPC FAN SPRY TIP SCT (DISPOSABLE) ×1 IMPLANT
SPONGE GAUZE 4X4 12PLY (GAUZE/BANDAGES/DRESSINGS) ×3 IMPLANT
STRIP CLOSURE SKIN 1/2X4 (GAUZE/BANDAGES/DRESSINGS) ×4 IMPLANT
SUCTION FRAZIER TIP 10 FR DISP (SUCTIONS) ×3 IMPLANT
SUT MNCRL AB 4-0 PS2 18 (SUTURE) ×3 IMPLANT
SUT VIC AB 0 CT1 27 (SUTURE)
SUT VIC AB 0 CT1 27XBRD ANBCTR (SUTURE) IMPLANT
SUT VIC AB 1 CT1 27 (SUTURE) ×6
SUT VIC AB 1 CT1 27XBRD ANBCTR (SUTURE) ×2 IMPLANT
SUT VIC AB 2-0 CT1 27 (SUTURE) ×6
SUT VIC AB 2-0 CT1 TAPERPNT 27 (SUTURE) ×2 IMPLANT
SYR 50ML LL SCALE MARK (SYRINGE) ×3 IMPLANT
SYR CONTROL 10ML LL (SYRINGE) ×3 IMPLANT
TOWEL OR 17X24 6PK STRL BLUE (TOWEL DISPOSABLE) ×3 IMPLANT
TOWEL OR 17X26 10 PK STRL BLUE (TOWEL DISPOSABLE) ×3 IMPLANT
WATER STERILE IRR 1000ML POUR (IV SOLUTION) ×6 IMPLANT

## 2014-05-14 NOTE — Discharge Summary (Addendum)
Patient ID: Maria [name] MRN: [id number] DOB/AGE: 74/02/1941 74 y.o.  Admit date: 05/14/2014 Discharge date: 05/17/14  Admission Diagnoses:  Active Problems:   DJD (degenerative joint disease) of knee   Discharge Diagnoses:  Same  Past Medical History  Diagnosis Date  . Hyperlipidemia   . Hypertension   . Peripheral vascular disease   . History of gastrointestinal hemorrhage   . GERD (gastroesophageal reflux disease)   . Bronchitis     hx of  . Arthritis   . History of bleeding ulcers   . Osteoarthritis of right knee 06/03/2012    Surgeries: Procedure(s): LEFT TOTAL KNEE ARTHROPLASTY on 05/14/2014   Consultants:    Discharged Condition: Improved  Hospital Course: Maria [name] is an 73 y.o. female who was admitted 05/14/2014 for operative treatment of<principal problem not specified>. Patient has severe unremitting pain that affects sleep, daily activities, and work/hobbies. After pre-op clearance the patient was taken to the operating room on 05/14/2014 and underwent  Procedure(s): LEFT TOTAL KNEE ARTHROPLASTY.    Patient was given perioperative antibiotics:     Anti-infectives   Start     Dose/Rate Route Frequency Ordered Stop   05/14/14 1400  ceFAZolin (ANCEF) IVPB 2 g/50 mL premix     2 g 100 mL/hr over 30 Minutes Intravenous Every 6 hours 05/14/14 1358 05/14/14 2135   05/14/14 0600  ceFAZolin (ANCEF) IVPB 2 g/50 mL premix     2 g 100 mL/hr over 30 Minutes Intravenous On call to O.R. 05/13/14 1449 05/14/14 0950       Patient was given sequential compression devices, early ambulation, and limited chemoprophylaxis due to history of life threatening GI Bleed to prevent DVT.  Patient benefited maximally from hospital stay and there were no complications.    Recent vital signs:  Patient Vitals for the past 24 hrs:  BP Temp Temp src Pulse Resp SpO2  05/17/14 0610 144/64 mmHg 98.5 F (36.9 C) Oral 74 18 100 %  05/17/14 0400 - - - - 16 100 %  05/17/14 0000 - -  - - 18 100 %  05/16/14 2000 - - - - 18 100 %  05/16/14 1939 147/69 mmHg 97.8 F (36.6 C) Oral 74 18 100 %  05/16/14 1516 - - - - 18 -  05/16/14 1300 154/76 mmHg 98.1 F (36.7 C) Oral 77 18 98 %  05/16/14 1146 - - - - 18 -     Recent laboratory studies:   Recent Labs  05/16/14 0610 05/17/14 0419  WBC 14.0* 7.5  HGB 9.9* 9.9*  HCT 29.4* 29.8*  PLT 210 198  NA 140 141  K 4.6 5.0  CL 103 104  CO2 25 28  BUN 21 21  CREATININE 1.03 1.07  GLUCOSE 124* 98  CALCIUM 9.2 9.1     Discharge Medications:     Medication List         amoxicillin 500 MG capsule  Commonly known as:  AMOXIL  2 tabs night before procedure and 2 tabs the morning of procedure     aspirin EC 325 MG tablet  Take 1 tablet (325 mg total) by mouth daily.     atenolol-chlorthalidone 50-25 MG per tablet  Commonly known as:  TENORETIC  Take 0.5 tablets by mouth daily.     bisacodyl 5 MG EC tablet  Commonly known as:  DULCOLAX  Take 1 tablet (5 mg total) by mouth daily as needed for moderate constipation.     CALCIUM PLUS  VITAMIN D PO  Take 2.5 tablets by mouth daily.     HYDROmorphone 2 MG tablet  Commonly known as:  DILAUDID  Take 1 tablet (2 mg total) by mouth every 4 (four) hours as needed for severe pain.     methocarbamol 500 MG tablet  Commonly known as:  ROBAXIN  Take 1 tablet (500 mg total) by mouth 4 (four) times daily.     omeprazole 20 MG capsule  Commonly known as:  PRILOSEC  Take 20 mg by mouth daily.     ondansetron 4 MG tablet  Commonly known as:  ZOFRAN  Take 1 tablet (4 mg total) by mouth every 8 (eight) hours as needed for nausea or vomiting.     potassium chloride SA 20 MEQ tablet  Commonly known as:  K-DUR,KLOR-CON  Take 20 mEq by mouth 2 (two) times daily.     simvastatin 20 MG tablet  Commonly known as:  ZOCOR  Take 20 mg by mouth 3 (three) times a week. take 1 tablet by mouth at bedtime        Diagnostic Studies: Dg Chest 2 View  05/06/2014   CLINICAL DATA:   Preoperative total knee arthroplasty; hypertension  EXAM: CHEST  2 VIEW  COMPARISON:  May 24, 2012  FINDINGS: There is no edema or consolidation. Heart is upper normal in size with normal pulmonary vascularity. There is atherosclerotic change in aorta. There is degenerative change in the thoracic spine and right shoulder regions.  IMPRESSION: No edema or consolidation.   Electronically Signed   By: Bretta Bang M.D.   On: 05/06/2014 12:43   Dg Knee Left Port  05/14/2014   CLINICAL DATA:  Postop  EXAM: PORTABLE LEFT KNEE - 1-2 VIEW  COMPARISON:  None.  FINDINGS: Left knee prosthetic components are well-seated and aligned. There is no acute fracture or evidence of an operative complication.  IMPRESSION: Well-aligned left knee prosthesis.   Electronically Signed   By: Amie Portland M.D.   On: 05/14/2014 12:22    Disposition: 03-Skilled Nursing Facility  Discharge Instructions   CPM    Complete by:  As directed   Continuous passive motion machine (CPM):      Use the CPM from 0- to 60 for 6 hours per day.      You may increase by 10 per day.  You may break it up into 2 or 3 sessions per day.      Use CPM for 2-3 weeks or until you are told to stop.     Call MD / Call 911    Complete by:  As directed   If you experience chest pain or shortness of breath, CALL 911 and be transported to the hospital emergency room.  If you develope a fever above 101 F, pus (white drainage) or increased drainage or redness at the wound, or calf pain, call your surgeon's office.     Change dressing    Complete by:  As directed   Change dressing on Saturday, then change the dressing daily with sterile 4 x 4 inch gauze dressing and apply TED hose.  You may clean the incision with alcohol prior to redressing.     Constipation Prevention    Complete by:  As directed   Drink plenty of fluids.  Prune juice may be helpful.  You may use a stool softener, such as Colace (over the counter) 100 mg twice a day.  Use MiraLax  (over the counter) for constipation as  needed.     Diet - low sodium heart healthy    Complete by:  As directed      Discharge instructions    Complete by:  As directed   Weight bearing as tolerated.  Take Aspirin 1 tab a day for the next 30 days to prevent blood clots.  Change dressing daily starting on Saturday.  May shower on Monday, but do not soak incision.  May apply ice for up to 20 minutes at a time for pain and swelling.  Follow up appointment in our office in two weeks.     Do not put a pillow under the knee. Place it under the heel.    Complete by:  As directed   Place gray foam under operative heel when in bed or in a chair to work on extension     Increase activity slowly as tolerated    Complete by:  As directed      TED hose    Complete by:  As directed   Use stockings (TED hose) for 2 weeks on both leg(s).  You may remove them at night for sleeping.           Follow-up Information   Follow up with [facility], MD. Schedule an appointment as soon as possible for a visit in 2 weeks.   Specialty:  Orthopedic Surgery   Contact information:   [address] [place] [address] [phone]        Signed: Eulas Post 05/17/2014, 8:52 AM

## 2014-05-14 NOTE — Anesthesia Postprocedure Evaluation (Signed)
  Anesthesia Post-op Note  Patient: Maria [name]  Procedure(s) Performed: Procedure(s): LEFT TOTAL KNEE ARTHROPLASTY (Left)  Patient Location: PACU  Anesthesia Type:MAC and Spinal  Level of Consciousness: awake and alert   Airway and Oxygen Therapy: Patient Spontanous Breathing  Post-op Pain: none  Post-op Assessment: Post-op Vital signs reviewed, Patient's Cardiovascular Status Stable, Respiratory Function Stable, Patent Airway, No signs of Nausea or vomiting and Pain level controlled  Post-op Vital Signs: Reviewed and stable  Last Vitals:  Filed Vitals:   05/14/14 1245  BP: 127/77  Pulse: 77  Temp:   Resp: 22    Complications: No apparent anesthesia complications

## 2014-05-14 NOTE — Anesthesia Procedure Notes (Signed)
Spinal  Patient location during procedure: OR Start time: 05/14/2014 9:48 AM End time: 05/14/2014 9:52 AM Staffing Anesthesiologist: Tab Rylee, CHRIS Preanesthetic Checklist Completed: patient identified, surgical consent, pre-op evaluation, timeout performed, IV checked, risks and benefits discussed and monitors and equipment checked Spinal Block Patient position: sitting Prep: site prepped and draped and DuraPrep Patient monitoring: heart rate, cardiac monitor, continuous pulse ox and blood pressure Approach: midline Location: L3-4 Injection technique: single-shot Needle Needle type: Pencan  Needle gauge: 24 G Needle length: 10 cm Assessment Sensory level: T6

## 2014-05-14 NOTE — H&P (View-Only) (Signed)
TOTAL KNEE ADMISSION H&P  Patient is being admitted for left total knee arthroplasty.  Subjective:  Chief Complaint:left knee pain.  HPI: Maria [name], 73 y.o. female, has a history of pain and functional disability in the left knee due to arthritis and has failed non-surgical conservative treatments for greater than 12 weeks to includeNSAID's and/or analgesics, corticosteriod injections, viscosupplementation injections, use of assistive devices and activity modification.  Onset of symptoms was gradual, starting >10 years ago with gradually worsening course since that time. The patient noted no past surgery on the left knee(s).  Patient currently rates pain in the left knee(s) at 7 out of 10 with activity. Patient has night pain, worsening of pain with activity and weight bearing, pain that interferes with activities of daily living, pain with passive range of motion and joint swelling.  Patient has evidence of subchondral sclerosis and joint space narrowing by imaging studies. There is no active infection.  Patient Active Problem List   Diagnosis Date Noted  . Contact dermatitis 02/27/2014  . Osteoarthritis of right knee 06/03/2012  . Asthma exacerbation, mild 11/14/2011  . OSTEOARTHROSIS, UNSPECIFIED SITE 09/06/2010  . VIRAL URI 08/20/2009  . HYPERLIPIDEMIA 08/21/2008  . HYPERTENSION 08/21/2008   Past Medical History  Diagnosis Date  . Hyperlipidemia   . Hypertension   . Peripheral vascular disease   . History of gastrointestinal hemorrhage   . GERD (gastroesophageal reflux disease)   . Bronchitis     hx of  . Arthritis   . History of bleeding ulcers   . Osteoarthritis of right knee 06/03/2012    Past Surgical History  Procedure Laterality Date  . Abdominal hysterectomy      nonmalignant reasons  . Total knee arthroplasty  05/30/2012    Procedure: TOTAL KNEE ARTHROPLASTY;  Surgeon: Loreta Ave, MD;  Location: [facility] OR;  Service: Orthopedics;  Laterality: Right;  DR MURPHY  WANTS 90 MINUTES FOR THIS CASE. OSTEONICS     (Not in a hospital admission) No Known Allergies  History  Substance Use Topics  . Smoking status: Never Smoker   . Smokeless tobacco: Not on file  . Alcohol Use: No    Family History  Problem Relation Age of Onset  . Heart disease Mother   . Other Father     mm other     Review of Systems  Constitutional: Negative.   HENT: Negative.   Eyes: Negative.   Respiratory: Negative.   Cardiovascular: Negative.   Gastrointestinal: Negative.   Genitourinary: Negative.   Musculoskeletal: Positive for joint pain.  Skin: Negative.   Neurological: Negative.   Endo/Heme/Allergies: Negative.   Psychiatric/Behavioral: Negative.     Objective:  Physical Exam  Constitutional: She is oriented to person, place, and time. She appears well-developed and well-nourished.  HENT:  Head: Normocephalic and atraumatic.  Eyes: EOM are normal. Pupils are equal, round, and reactive to light.  Neck: Normal range of motion. Neck supple.  Cardiovascular: Normal rate and regular rhythm.  Exam reveals no gallop and no friction rub.   No murmur heard. Respiratory: Effort normal and breath sounds normal. No respiratory distress. She has no wheezes. She has no rales.  GI: Soft. Bowel sounds are normal. She exhibits no distension. There is no tenderness.  Musculoskeletal:  Alert and oriented x 3.  She has an antalgic gait with a Trendelenburg component on the left and is having to use the assistance of a cane with ambulation.  Range of motion is from about 5-120 degrees.  No apparent valgus or varus stress.  Positive patellofemoral crepitus.  Tenderness to palpation over the medial joint line.  Ligaments are stable.      Neurological: She is alert and oriented to person, place, and time.  Skin: Skin is warm and dry.  Psychiatric: She has a normal mood and affect. Her behavior is normal. Judgment and thought content normal.    Vital signs in last 24  hours: @VSRANGES @  Labs:   Estimated body mass index is 31.05 kg/(m^2) as calculated from the following:   Height as of 11/07/13: 5' (1.524 m).   Weight as of 02/27/14: 72.122 kg (159 lb).   Imaging Review Plain radiographs demonstrate severe degenerative joint disease of the left knee(s). The overall alignment isneutral. The bone quality appears to be fair for age and reported activity level.  Assessment/Plan:  End stage arthritis, left knee   The patient history, physical examination, clinical judgment of the provider and imaging studies are consistent with end stage degenerative joint disease of the left knee(s) and total knee arthroplasty is deemed medically necessary. The treatment options including medical management, injection therapy arthroscopy and arthroplasty were discussed at length. The risks and benefits of total knee arthroplasty were presented and reviewed. The risks due to aseptic loosening, infection, stiffness, patella tracking problems, thromboembolic complications and other imponderables were discussed. The patient acknowledged the explanation, agreed to proceed with the plan and consent was signed. Patient is being admitted for inpatient treatment for surgery, pain control, PT, OT, prophylactic antibiotics, VTE prophylaxis, progressive ambulation and ADL's and discharge planning. The patient is planning to be discharged to skilled nursing facility

## 2014-05-14 NOTE — Transfer of Care (Signed)
Immediate Anesthesia Transfer of Care Note  Patient: Maria [name]  Procedure(s) Performed: Procedure(s): LEFT TOTAL KNEE ARTHROPLASTY (Left)  Patient Location: PACU  Anesthesia Type:Spinal  Level of Consciousness: awake, alert  and oriented  Airway & Oxygen Therapy: Patient Spontanous Breathing  Post-op Assessment: Report given to PACU RN and Post -op Vital signs reviewed and stable  Post vital signs: Reviewed and stable  Complications: No apparent anesthesia complications

## 2014-05-14 NOTE — Discharge Instructions (Signed)
Total Knee Replacement °Care After °Refer to this sheet in the next few weeks. These instructions provide you with information on caring for yourself after your procedure. Your caregiver also may give you specific instructions. Your treatment has been planned according to the most current medical practices, but problems sometimes occur. Call your caregiver if you have any problems or questions after your procedure. °HOME CARE INSTRUCTIONS  ° °Weight bearing as tolerated. Take Aspirin 1 tab a day for the next 30 days to prevent blood clots.  Change dressing daily starting on Saturday.  May shower on Monday, but do not soak incision.  May apply ice for up to 20 minutes at a time for pain and swelling.  Follow up appointment in our office in two weeks.  ° °· See a physical therapist as directed by your caregiver. °· Take over-the-counter or prescription medicines for pain, discomfort, or fever only as directed by your caregiver. °· Avoid lifting or driving until you are instructed otherwise. °· If you have been sent home with a continuous passive motion machine, use it as directed by your caregiver. °SEEK MEDICAL CARE IF: °· You have difficulty breathing. °· Your wound is red, swollen, or has become increasingly painful. °· You have pus draining from your wound. °· You have a bad smell coming from your wound. °· You have persistent bleeding from your wound. °· Your wound breaks open after sutures (stitches) or staples have been removed. °SEEK IMMEDIATE MEDICAL CARE IF:  °· You have a fever. °· You have a rash. °· You have pain or swelling in your calf or thigh. °· You have shortness of breath or chest pain. °· Your range of motion in your knee is decreasing rather than increasing. °MAKE SURE YOU:  °· Understand these instructions. °· Will watch your condition. °· Will get help right away if you are not doing well or get worse. °Document Released: 06/10/2005 Document Revised: 05/22/2012 Document Reviewed:  01/10/2012 °ExitCare® Patient Information ©2014 [employer]. ° °

## 2014-05-14 NOTE — Interval H&P Note (Signed)
History and Physical Interval Note:  05/14/2014 8:26 AM  Maria [name]  has presented today for surgery, with the diagnosis of OA LEFT KNEE  The various methods of treatment have been discussed with the patient and family. After consideration of risks, benefits and other options for treatment, the patient has consented to  Procedure(s): LEFT TOTAL KNEE ARTHROPLASTY (Left) as a surgical intervention .  The patient's history has been reviewed, patient examined, no change in status, stable for surgery.  I have reviewed the patient's chart and labs.  Questions were answered to the patient's satisfaction.     Starla Deller F

## 2014-05-14 NOTE — Evaluation (Signed)
Physical Therapy Evaluation Patient Details Name: Maria [name] MRN: [id number] DOB: 06/12/1941 Today's Date: 05/14/2014   History of Present Illness  s/p Lt TKA 05/14/14. Pt had Rt TKA in 2014.  Clinical Impression  Pt is s/p Lt TKA POD#0 resulting in the deficits listed below (see PT Problem List).  Pt will benefit from skilled PT to increase their independence and safety with mobility to allow discharge to the venue listed below. Pt limited in evaluation due to c/o 10/10 pain with activity. Plans to D/C to [name] for rehab due to living alone.      Follow Up Recommendations SNF    Equipment Recommendations  None recommended by PT    Recommendations for Other Services OT consult     Precautions / Restrictions Precautions Precautions: Fall;Knee Precaution Comments: pt given TKA HEP handout Required Braces or Orthoses: Knee Immobilizer - Left Knee Immobilizer - Left: Other (comment) (till D/C by MD) Restrictions Weight Bearing Restrictions: Yes LLE Weight Bearing: Weight bearing as tolerated      Mobility  Bed Mobility Overal bed mobility: Needs Assistance Bed Mobility: Supine to Sit     Supine to sit: Min assist;HOB elevated     General bed mobility comments: (A) to advance Lt LE to/off EOB; cues for hand placement and sequencing using handrails  Transfers Overall transfer level: Needs assistance Equipment used: Rolling walker (2 wheeled) Transfers: Sit to/from [employer] CorporationStand;Stand Pivot Transfers Sit to Stand: Min assist Stand pivot transfers: Min assist       General transfer comment: (A) to achieve upright standing position and maintain balance; pt with decreased ability to WB through Lt LE due to pain; cues for pivotal steps to chair and (A) to manage RW   Ambulation/Gait             General Gait Details: pivotal steps to chair   Stairs            Wheelchair Mobility    Modified Rankin (Stroke Patients Only)       Balance Overall balance  assessment: Needs assistance Sitting-balance support: Feet supported;Single extremity supported;No upper extremity supported Sitting balance-Leahy Scale: Fair     Standing balance support: During functional activity;Bilateral upper extremity supported Standing balance-Leahy Scale: Poor Standing balance comment: relies heavily on RW for support to balance                             Pertinent Vitals/Pain 10/10; RN made aware. patient repositioned for comfort     Home Living Family/patient expects to be discharged to:: Private residence Living Arrangements: Alone Available Help at Discharge: Available 24 hours/day;Skilled Nursing Facility Type of Home: House           Additional Comments: [facility]    Prior Function Level of Independence: Independent with assistive device(s)         Comments: PTA pt was ambulating with cane     Hand Dominance        Extremity/Trunk Assessment   Upper Extremity Assessment: Defer to OT evaluation           Lower Extremity Assessment: LLE deficits/detail      Cervical / Trunk Assessment: Kyphotic  Communication   Communication: No difficulties  Cognition Arousal/Alertness: Awake/alert Behavior During Therapy: WFL for tasks assessed/performed Overall Cognitive Status: Within Functional Limits for tasks assessed  General Comments      Exercises Total Joint Exercises Ankle Circles/Pumps: AROM;Both;10 reps;Seated      Assessment/Plan    PT Assessment Patient needs continued PT services  PT Diagnosis Difficulty walking;Generalized weakness;Acute pain   PT Problem List Decreased strength;Decreased range of motion;Decreased activity tolerance;Decreased balance;Decreased mobility;Decreased knowledge of use of DME;Pain  PT Treatment Interventions DME instruction;Gait training;Functional mobility training;Therapeutic activities;Therapeutic  exercise;Balance training;Neuromuscular re-education;Patient/family education   PT Goals (Current goals can be found in the Care Plan section) Acute Rehab PT Goals Patient Stated Goal: to go to [facility] then home PT Goal Formulation: With patient Time For Goal Achievement: 05/19/14 Potential to Achieve Goals: Good    Frequency 7X/week   Barriers to discharge Decreased caregiver support lives alone    Co-evaluation               End of Session Equipment Utilized During Treatment: Gait belt;Left knee immobilizer Activity Tolerance: Patient limited by pain Patient left: in chair;with call bell/phone within reach;with family/visitor present Nurse Communication: Mobility status;Patient requests pain meds         Time: 9892-1194 PT Time Calculation (min): 18 min   Charges:   PT Evaluation $Initial PT Evaluation Tier I: 1 Procedure PT Treatments $Therapeutic Activity: 8-22 mins   PT G CodesDonell Sievert, Dat[place]  [phone] 05/14/2014, 5:12 PM

## 2014-05-14 NOTE — Progress Notes (Signed)
Orthopedic Tech Progress Note Patient Details:  Maria [name] [birth year] [id number]  CPM Left Knee CPM Left Knee: On Left Knee Flexion (Degrees): 60 Left Knee Extension (Degrees): 0 Additional Comments: Trapeze bar and foot roll   Shawnie Pons 05/14/2014, 1:37 PM

## 2014-05-14 NOTE — Progress Notes (Signed)
Utilization review completed.  

## 2014-05-14 NOTE — Anesthesia Preprocedure Evaluation (Signed)
Anesthesia Evaluation  Patient identified by MRN, date of birth, ID band Patient awake    Reviewed: Allergy & Precautions, H&P , NPO status , Patient's Chart, lab work & pertinent test results  History of Anesthesia Complications Negative for: history of anesthetic complications  Airway       Dental   Pulmonary asthma , neg recent URI,          Cardiovascular hypertension, Pt. on medications and Pt. on home beta blockers - angina+ Peripheral Vascular Disease - Past MI and - CHF - dysrhythmias - Valvular Problems/Murmurs    Neuro/Psych negative neurological ROS  negative psych ROS   GI/Hepatic Neg liver ROS, GERD-  Medicated and Controlled,  Endo/Other  negative endocrine ROS  Renal/GU negative Renal ROS     Musculoskeletal  (+) Arthritis -, Osteoarthritis,    Abdominal   Peds  Hematology negative hematology ROS (+)   Anesthesia Other Findings   Reproductive/Obstetrics                           Anesthesia Physical Anesthesia Plan  ASA: II  Anesthesia Plan: Spinal and MAC   Post-op Pain Management:    Induction:   Airway Management Planned: Natural Airway and Nasal Cannula  Additional Equipment: None  Intra-op Plan:   Post-operative Plan:   Informed Consent: I have reviewed the patients History and Physical, chart, labs and discussed the procedure including the risks, benefits and alternatives for the proposed anesthesia with the patient or authorized representative who has indicated his/her understanding and acceptance.   Dental advisory given  Plan Discussed with: Surgeon and CRNA  Anesthesia Plan Comments:         Anesthesia Quick Evaluation

## 2014-05-15 ENCOUNTER — Encounter (HOSPITAL_COMMUNITY): Payer: Self-pay | Admitting: Orthopedic Surgery

## 2014-05-15 LAB — CBC
HCT: 34.2 % — ABNORMAL LOW (ref 36.0–46.0)
HEMOGLOBIN: 11.8 g/dL — AB (ref 12.0–15.0)
MCH: 28.2 pg (ref 26.0–34.0)
MCHC: 34.5 g/dL (ref 30.0–36.0)
MCV: 81.8 fL (ref 78.0–100.0)
Platelets: 209 10*3/uL (ref 150–400)
RBC: 4.18 MIL/uL (ref 3.87–5.11)
RDW: 13.6 % (ref 11.5–15.5)
WBC: 7.6 10*3/uL (ref 4.0–10.5)

## 2014-05-15 LAB — BASIC METABOLIC PANEL
BUN: 15 mg/dL (ref 6–23)
CO2: 23 mEq/L (ref 19–32)
Calcium: 9.1 mg/dL (ref 8.4–10.5)
Chloride: 104 mEq/L (ref 96–112)
Creatinine, Ser: 0.89 mg/dL (ref 0.50–1.10)
GFR, EST AFRICAN AMERICAN: 72 mL/min — AB (ref >90)
GFR, EST NON AFRICAN AMERICAN: 62 mL/min — AB (ref >90)
GLUCOSE: 158 mg/dL — AB (ref 70–99)
POTASSIUM: 4.2 meq/L (ref 3.7–5.3)
SODIUM: 141 meq/L (ref 137–147)

## 2014-05-15 LAB — GLUCOSE, CAPILLARY: Glucose-Capillary: 282 mg/dL — ABNORMAL HIGH (ref 70–99)

## 2014-05-15 NOTE — Progress Notes (Signed)
PT Cancellation Note  Patient Details Name: [name] MRN: [id number] DOB: [birth year]   Cancelled Treatment:    Reason Eval/Treat Not Completed: Other (comment).  Attempted to see pt x2 this am with pt on 3-in-1 and then washing up.  Will try again later.     Lawsyn Heiler, Alison Murray 05/15/2014, 10:48 AM

## 2014-05-15 NOTE — Progress Notes (Addendum)
Clinical Social Work Department CLINICAL SOCIAL WORK PLACEMENT NOTE 05/15/2014  Patient:  Maria [name], Maria [name]  Account Number:  [id number] Admit date:  05/14/2014  Clinical Social Worker:  Sharol Harness, Theresia Majors  Date/time:  05/15/2014 11:30 AM  Clinical Social Work is seeking post-discharge placement for this patient at the following level of care:   SKILLED NURSING   (*CSW will update this form in Epic as items are completed)   05/15/2014  Patient/family provided with [facility] Department of Clinical Social Work's list of facilities offering this level of care within the geographic area requested by the patient (or if unable, by the patient's family).  05/15/2014  Patient/family informed of their freedom to choose among providers that offer the needed level of care, that participate in Medicare, Medicaid or managed care program needed by the patient, have an available bed and are willing to accept the patient.  05/15/2014  Patient/family informed of [facility]' ownership interest in [facility], as well as of the fact that they are under no obligation to receive care at this facility.  PASARR submitted to EDS on existing PASARR number received on   FL2 transmitted to all facilities in geographic area requested by pt/family on  05/15/2014 FL2 transmitted to all facilities within larger geographic area on   Patient informed that his/her managed care company has contracts with or will negotiate with  certain facilities, including the following:     Patient/family informed of bed offers received:   Patient chooses bed at  Physician recommends and patient chooses bed at    Patient to be transferred to [facility] on 05/17/14- Jetta Lout, LCSWA    Patient to be transferred to facility by [facility]- Jetta Lout, LCSWA  Patient and family notified of transfer on  Name of family member notified: Patient stated she would call her grandson and make him aware of above.Jetta Lout, LCSWA     The following physician request were entered in Epic:   Additional Comments:   Poonum Ambelal, LCSWA [phone]

## 2014-05-15 NOTE — Progress Notes (Signed)
Subjective: 1 Day Post-Op Procedure(s) (LRB): LEFT TOTAL KNEE ARTHROPLASTY (Left) Patient reports pain as 1 on 0-10 scale.  Patient has no recollection of surgery yesterday, but is alert to person, place and time.  No nausea/vomiting, lightheadedness/dizziness.  No flatus and no bm as of yet.  Tolerating diet.  Objective: Vital signs in last 24 hours: Temp:  [97.5 F (36.4 C)-99.1 F (37.3 C)] 98.6 F (37 C) (06/11 0624) Pulse Rate:  [63-79] 72 (06/11 0624) Resp:  [13-22] 18 (06/11 0624) BP: (99-167)/(61-99) 136/92 mmHg (06/11 0624) SpO2:  [95 %-100 %] 99 % (06/11 0624) Weight:  [72.712 kg (160 lb 4.8 oz)] 72.712 kg (160 lb 4.8 oz) (06/10 0746)  Intake/Output from previous day: 06/10 0701 - 06/11 0700 In: 2665 [P.O.:480; I.V.:2185] Out: 1035 [Urine:600; Drains:435] Intake/Output this shift:    No results found for this basename: HGB,  in the last 72 hours No results found for this basename: WBC, RBC, HCT, PLT,  in the last 72 hours No results found for this basename: NA, K, CL, CO2, BUN, CREATININE, GLUCOSE, CALCIUM,  in the last 72 hours No results found for this basename: LABPT, INR,  in the last 72 hours  Neurologically intact Neurovascular intact Sensation intact distally Intact pulses distally Dorsiflexion/Plantar flexion intact Compartment soft hemovac drain pulled by me today No drainage noted through ace wrap  Assessment/Plan: 1 Day Post-Op Procedure(s) (LRB): LEFT TOTAL KNEE ARTHROPLASTY (Left) Advance diet Up with therapy WBAT LLE  ANTON, M. LINDSEY 05/15/2014, 7:07 AM

## 2014-05-15 NOTE — Plan of Care (Signed)
Problem: Consults Goal: Diagnosis- Total Joint Replacement Primary Total Knee Left     

## 2014-05-15 NOTE — Progress Notes (Signed)
Physical Therapy Treatment Patient Details Name: Maria [name] MRN: [id number] DOB: 09/21/1941 Today's Date: 05/15/2014    History of Present Illness s/p Lt TKA 05/14/14    PT Comments    Pt very motivated and demos improved mobility today.  Still feel SNF safest D/C option at this time.  Will continue to follow.    Follow Up Recommendations  SNF     Equipment Recommendations  None recommended by PT    Recommendations for Other Services OT consult     Precautions / Restrictions Precautions Precautions: Fall;Knee Required Braces or Orthoses: Knee Immobilizer - Left Knee Immobilizer - Left:  (Till D/C by MD) Restrictions Weight Bearing Restrictions: Yes LLE Weight Bearing: Weight bearing as tolerated    Mobility  Bed Mobility                  Transfers Overall transfer level: Needs assistance Equipment used: Rolling walker (2 wheeled) Transfers: Sit to/from Stand Sit to Stand: Min guard         General transfer comment: demos good use of UEs and positioning LEs.    Ambulation/Gait Ambulation/Gait assistance: Min guard Ambulation Distance (Feet): 80 Feet Assistive device: Rolling walker (2 wheeled) Gait Pattern/deviations: Step-to pattern;Decreased step length - right;Decreased stance time - left     General Gait Details: demos good use of RW, cues for upright posture.     Stairs            Wheelchair Mobility    Modified Rankin (Stroke Patients Only)       Balance Overall balance assessment: Needs assistance Sitting-balance support: No upper extremity supported;Feet supported Sitting balance-Leahy Scale: Fair     Standing balance support: During functional activity Standing balance-Leahy Scale: Fair Standing balance comment: pt able to stand without UE support, but unable to accept challenge.                      Cognition Arousal/Alertness: Awake/alert Behavior During Therapy: WFL for tasks assessed/performed Overall  Cognitive Status: Within Functional Limits for tasks assessed                      Exercises Total Joint Exercises Long Arc Quad: AAROM;Left;5 reps Knee Flexion: AAROM;Left;5 reps Goniometric ROM: 5 - 50    General Comments        Pertinent Vitals/Pain "Oh it's not bad baby!"  Premedicated.      Home Living                      Prior Function            PT Goals (current goals can now be found in the care plan section) Acute Rehab PT Goals Patient Stated Goal: [facility] then home PT Goal Formulation: With patient Time For Goal Achievement: 05/19/14 Potential to Achieve Goals: Good Progress towards PT goals: Progressing toward goals    Frequency  7X/week    PT Plan Current plan remains appropriate    Co-evaluation             End of Session Equipment Utilized During Treatment: Gait belt;Left knee immobilizer Activity Tolerance: Patient limited by pain Patient left: in chair;with call bell/phone within reach;with family/visitor present     Time: 1308-65781116-1148 PT Time Calculation (min): 32 min  Charges:  $Gait Training: 8-22 mins $Therapeutic Exercise: 8-22 mins  G CodesSunny Schlein, [place] [phone] 05/15/2014, 12:10 PM

## 2014-05-15 NOTE — Evaluation (Signed)
Occupational Therapy Evaluation Patient Details Name: Maria [name] MRN: [id number] DOB: 1941/04/09 Today's Date: 05/15/2014    History of Present Illness s/p Lt TKA 05/14/14   Clinical Impression   This 73 yo female admitted and underwent above presents to acute OT with decreased AROM LLE and decreased balance all affecting pt's ability to care for herself at home. Pt will benefit from rehab stay at SNF to get to an Independent to Mod I level before returning home alone.     Follow Up Recommendations  SNF    Equipment Recommendations   (TBD next venue)       Precautions / Restrictions Precautions Precautions: Fall;Knee Required Braces or Orthoses: Knee Immobilizer - Left Knee Immobilizer - Left:  (Till D/C by MD) Restrictions Weight Bearing Restrictions: No LLE Weight Bearing: Weight bearing as tolerated      Mobility Bed Mobility               General bed mobility comments: Pt up in recliner upon arrival  Transfers Overall transfer level: Needs assistance Equipment used: Rolling walker (2 wheeled) Transfers: Sit to/from Stand Sit to Stand: Min guard         General transfer comment: demos good use of UEs and positioning LEs.      Balance Overall balance assessment: Needs assistance Sitting-balance support: No upper extremity supported;Feet supported Sitting balance-Leahy Scale: Fair     Standing balance support: Single extremity supported Standing balance-Leahy Scale: Fair Standing balance comment: pt able to stand without UE support, but unable to accept challenge.                              ADL Overall ADL's : Needs assistance/impaired Eating/Feeding: Independent;Sitting   Grooming: Min guard;Standing;Wash/dry hands   Upper Body Bathing: Set up;Sitting   Lower Body Bathing: Minimal assistance;Sit to/from stand   Upper Body Dressing : Set up;Sitting   Lower Body Dressing: Minimal assistance;Sit to/from stand   Toilet  Transfer: Minimal assistance;Comfort height toilet;Ambulation;Grab bars   Toileting- Clothing Manipulation and Hygiene: Min guard;Sit to/from stand       Functional mobility during ADLs: Minimal assistance                 Pertinent Vitals/Pain No c/o pain     Hand Dominance  right   Extremity/Trunk Assessment Upper Extremity Assessment Upper Extremity Assessment: Overall WFL for tasks assessed              Cognition Arousal/Alertness: Awake/alert Behavior During Therapy: WFL for tasks assessed/performed Overall Cognitive Status: Within Functional Limits for tasks assessed                        Exercises Exercises: Total Joint          Home Living Family/patient expects to be discharged to:: Skilled nursing facility                                             OT Diagnosis: Generalized weakness   OT Problem List: Decreased strength;Decreased range of motion;Impaired balance (sitting and/or standing)   OT Treatment/Interventions:      OT Goals(Current goals can be found in the care plan section) Acute Rehab OT Goals Patient Stated Goal: to go to [facility] for rehab then home  OT Frequency:  End of Session Equipment Utilized During Treatment: Gait belt;Rolling walker;Left knee immobilizer CPM Left Knee CPM Left Knee: On Left Knee Flexion (Degrees): 60 Left Knee Extension (Degrees): 0  Activity Tolerance: Patient tolerated treatment well Patient left: in bed;in CPM   Time: 1351-1438 OT Time Calculation (min): 47 min Charges:  OT General Charges $OT Visit: 1 Procedure OT Evaluation $Initial OT Evaluation Tier I: 1 Procedure OT Treatments $Self Care/Home Management : 38-52 mins  Evette GeorgesLeonard, Arnola Crittendon Eva [phone] 05/15/2014, 3:26 PM

## 2014-05-15 NOTE — Progress Notes (Signed)
Clinical Social Work Department BRIEF PSYCHOSOCIAL ASSESSMENT 05/15/2014  Patient:  LAROYCE, WUJCIK     Account Number:  [id number]     Admit date:  05/14/2014  Clinical Social Worker:  Harless Nakayama  Date/Time:  05/15/2014 11:00 AM  Referred by:  Physician  Date Referred:  05/15/2014 Referred for  SNF Placement   Other Referral:   Interview type:  Patient Other interview type:    PSYCHOSOCIAL DATA Living Status:  ALONE Admitted from facility:   Level of care:   Primary support name:  Darrol Jump [phone] Primary support relationship to patient:  FAMILY Degree of support available:   Pt has good support    CURRENT CONCERNS Current Concerns  Post-Acute Placement   Other Concerns:    SOCIAL WORK ASSESSMENT / PLAN CSW made aware that pt is pre-registered at [facility]. CSW visited pt room and spoke with pt about ST rehab. Pt is very familiar with the process from previous admission. Pt informed CSW she has pre-registered at [facility] because she lives alone and was worried about managing at home. Pt also informed CSW she will need non-emergent ambulance transport. Pt stated MD had told her he was hoping for dc on Saturday but unsure if this was possible. CSW informed pt that [facility] accept weekend admissions and weekend CSW would be available to assist. Pt expressed no concerns about her surgery/diagnosis and informed CSW that she made it through the last time so she will do the same this time.    CSW called facility to notify of potential for weekend discharge.   Assessment/plan status:  Psychosocial Support/Ongoing Assessment of Needs Other assessment/ plan:   Information/referral to community resources:   None needed    PATIENT'S/FAMILY'S RESPONSE TO PLAN OF CARE: Pt is wanting to dc to SNF       Eladia Frame, LCSWA [phone]

## 2014-05-15 NOTE — Op Note (Signed)
Maria [name], Maria [name] NO.:  [id number]  MEDICAL RECORD NO.:  [id number]  LOCATION:  5N21C                        FACILITY:  [facility]  PHYSICIAN:  Loreta Ave, M.D. DATE OF BIRTH:  1941-04-11  DATE OF PROCEDURE:  05/14/2014 DATE OF DISCHARGE:                              OPERATIVE REPORT   PREOPERATIVE DIAGNOSIS:  Left knee end-stage degenerative arthritis, varus alignment.  POSTOPERATIVE DIAGNOSIS:  Left knee end-stage degenerative arthritis, varus alignment.  PROCEDURE:  Modified minimally invasive left total knee replacement Stryker triathlon prosthesis.  Soft tissue balancing.  Cemented pegged posterior stabilized #3 femoral component.  Cemented #3 tibial component with a 9 mm polyethylene insert.  Cemented resurfacing 32-mm patellar component.  SURGEON:  Loreta Ave, M.D.  ASSISTANT:  Odelia Gage, PA; present throughout the entire case and necessary for timely completion of procedure.  ANESTHESIA:  General.  ESTIMATED BLOOD LOSS:  Minimal.  SPECIMENS:  None.  CULTURES:  None.  COMPLICATIONS:  None.  DRESSING:  Soft compressive.  DRAINS:  Hemovac x1.  TOURNIQUET TIME:  45 minutes.  PROCEDURE IN DETAIL:  The patient was brought to the operating room, placed on the operating table in supine position.  After adequate anesthesia had been obtained, tourniquet applied.  Prepped and draped in usual sterile fashion.  Exsanguinated with elevation of Esmarch. Tourniquet inflated to 300 mmHg.  Straight incision above the patella down to tibial tubercle.  Skin and subcutaneous tissue were divided. Hemostasis with cautery.  Medial arthrotomy vastus splitting, preserving quad tendon.  Medial capsule release.  Knee exposed.  Grade 4 changes throughout.  Intramedullary guide distal femur.  Flexible rod.  An 8 mm resection and 5 degrees of valgus, distal femur.  Using epicondylar axis, the femur was sized, cut, and fitted for a posterior  stabilized pegged #3 component.  Proximal tibial resection extramedullary guide below the defect medially, sized to 3 component as well.  Nicely balanced knee.  Debris cleared throughout.  Patella exposed.  Posterior 10 mm removed.  Drilled, sized, and fitted for a 32-mm component. Trials put in place, 9 mm insert.  With this construct, nice bottom mechanical axis, full motion and good stability, nicely balanced good patellar tracking.  Tibia was marked for rotation and hand reamed.  All trials removed.  Copious irrigation with pulse irrigating device. Cement prepared, placed on all components, firmly seated.  Polyethylene attached to tibia, knee reduced.  Patella held with a clamp.  Once cement hardened, Hemovac was placed.  Knee irrigated again.  Soft tissue injection with Exparel.  Arthrotomy closed #1 Vicryl; skin and subcutaneous tissue with Vicryl with subcutaneous subcuticular closure. Margins were injected with Marcaine.  Sterile compressive dressing applied.  Tourniquet removed.  Knee immobilizer applied.  Anesthesia reversed.  Brought to the recovery room.  Tolerated the surgery well with no complications.     Loreta Ave, M.D.     DFM/MEDQ  D:  05/14/2014  T:  05/15/2014  Job:  [id number]

## 2014-05-16 LAB — CBC
HCT: 29.4 % — ABNORMAL LOW (ref 36.0–46.0)
Hemoglobin: 9.9 g/dL — ABNORMAL LOW (ref 12.0–15.0)
MCH: 27.6 pg (ref 26.0–34.0)
MCHC: 33.7 g/dL (ref 30.0–36.0)
MCV: 81.9 fL (ref 78.0–100.0)
Platelets: 210 10*3/uL (ref 150–400)
RBC: 3.59 MIL/uL — ABNORMAL LOW (ref 3.87–5.11)
RDW: 13.6 % (ref 11.5–15.5)
WBC: 14 10*3/uL — ABNORMAL HIGH (ref 4.0–10.5)

## 2014-05-16 LAB — BASIC METABOLIC PANEL
BUN: 21 mg/dL (ref 6–23)
CO2: 25 mEq/L (ref 19–32)
Calcium: 9.2 mg/dL (ref 8.4–10.5)
Chloride: 103 mEq/L (ref 96–112)
Creatinine, Ser: 1.03 mg/dL (ref 0.50–1.10)
GFR, EST AFRICAN AMERICAN: 61 mL/min — AB (ref >90)
GFR, EST NON AFRICAN AMERICAN: 52 mL/min — AB (ref >90)
Glucose, Bld: 124 mg/dL — ABNORMAL HIGH (ref 70–99)
POTASSIUM: 4.6 meq/L (ref 3.7–5.3)
SODIUM: 140 meq/L (ref 137–147)

## 2014-05-16 NOTE — Progress Notes (Signed)
Per Jasmine December- Admissions at [facility] is in place for patient. Can admit this weekend if indicated per MD.  Patient's admission papers have been signed with facility.  Will notify weekend CSW for follow up. Lorri Frederick. [place]  [phone]

## 2014-05-16 NOTE — Clinical Social Work Placement (Addendum)
    Clinical Social Work Department CLINICAL SOCIAL WORK PLACEMENT NOTE 05/16/2014  Patient:  Maria [name], Maria [name]  Account Number:  [id number] Admit date:  05/14/2014  Clinical Social Worker:  Sharol Harness, Theresia Majors  Date/time:  05/15/2014 11:30 AM  Clinical Social Work is seeking post-discharge placement for this patient at the following level of care:   SKILLED NURSING   (*CSW will update this form in Epic as items are completed)   05/15/2014  Patient/family provided with [facility] Department of Clinical Social Work's list of facilities offering this level of care within the geographic area requested by the patient (or if unable, by the patient's family).  05/15/2014  Patient/family informed of their freedom to choose among providers that offer the needed level of care, that participate in Medicare, Medicaid or managed care program needed by the patient, have an available bed and are willing to accept the patient.  05/15/2014  Patient/family informed of [facility]' ownership interest in [facility], as well as of the fact that they are under no obligation to receive care at this facility.  PASARR submitted to EDS on  PASARR number received on   FL2 transmitted to all facilities in geographic area requested by pt/family on  05/15/2014 FL2 transmitted to all facilities within larger geographic area on   Patient informed that his/her managed care company has contracts with or will negotiate with  certain facilities, including the following:   has existing number  Pre-registered at [facility]     Patient/family informed of bed offers received:  05/15/2014 Patient chooses bed at [facility] Physician recommends and patient chooses bed at    Patient to be transferred to [facility] on 05/17/14- Jetta Lout, LCSWA   Patient to be transferred to facility by Ambulance  [facility]) Patient and family notified of transfer on  Name of family member notified: Patient stated she  would call her grandson and make him aware of transport.Jetta Lout, LCSWA     The following physician request were entered in Epic:   Additional Comments:

## 2014-05-16 NOTE — Progress Notes (Signed)
Physical Therapy Treatment Patient Details Name: Maria [name] MRN: [id number] DOB: 11-16-1941 Today's Date: 05/16/2014    History of Present Illness s/p Lt TKA 05/14/14    PT Comments    Patient progressing well this morning. Patient requires increased time for all activities. Continue to recommend SNF at DC.   Follow Up Recommendations  SNF     Equipment Recommendations  None recommended by PT    Recommendations for Other Services       Precautions / Restrictions Precautions Precautions: Fall;Knee Required Braces or Orthoses: Knee Immobilizer - Left Restrictions LLE Weight Bearing: Weight bearing as tolerated    Mobility  Bed Mobility               General bed mobility comments: Pt up in recliner upon arrival  Transfers Overall transfer level: Needs assistance Equipment used: Rolling walker (2 wheeled)   Sit to Stand: Min guard         General transfer comment: Patient with safe technique. Minguard for safety. Stood x3 throughout session  Ambulation/Gait Ambulation/Gait assistance: Min guard Ambulation Distance (Feet): 100 Feet Assistive device: Rolling walker (2 wheeled) Gait Pattern/deviations: Step-to pattern Gait velocity: decreased   General Gait Details: Cues for posture. One seated rest break   Stairs            Wheelchair Mobility    Modified Rankin (Stroke Patients Only)       Balance                                    Cognition Arousal/Alertness: Awake/alert Behavior During Therapy: WFL for tasks assessed/performed Overall Cognitive Status: Within Functional Limits for tasks assessed                      Exercises Total Joint Exercises Quad Sets: AROM;Left;10 reps Heel Slides: AAROM;Left;10 reps Hip ABduction/ADduction: AAROM;Left;10 reps Straight Leg Raises: AAROM;Left;10 reps Long Arc Quad: AAROM;Left;10 reps Goniometric ROM: 55 degree AROM in long sitting    General Comments         Pertinent Vitals/Pain 5/10 L knee pain. patient repositioned for comfort     Home Living                      Prior Function            PT Goals (current goals can now be found in the care plan section) Progress towards PT goals: Progressing toward goals    Frequency  7X/week    PT Plan Current plan remains appropriate    Co-evaluation             End of Session Equipment Utilized During Treatment: Gait belt;Left knee immobilizer Activity Tolerance: Patient tolerated treatment well Patient left: in chair;with call bell/phone within reach     Time: 0920-1001 PT Time Calculation (min): 41 min  Charges:  $Gait Training: 23-37 mins $Therapeutic Exercise: 8-22 mins                    G Codes:      Fredrich Birks 05/16/2014, 11:31 AM 05/16/2014 Fredrich Birks PTA [phone] pager [phone] office

## 2014-05-16 NOTE — Care Management Note (Signed)
CARE MANAGEMENT NOTE 05/16/2014  Patient:  [facility]   Account Number:  [id number]  Date Initiated:  05/16/2014  Documentation initiated by:  Vance Peper  Subjective/Objective Assessment:   73 yr old female s/p left total knee arthroplasty.     Action/Plan:   Patient is for shortterm rehab at SNF, she wants [facility]. Social Worker is aware.   Anticipated DC Date:  05/17/2014   Anticipated DC Plan:  SKILLED NURSING FACILITY  In-house referral  Clinical Social Worker      DC Planning Services  CM consult      [facility] Choice  NA   Choice offered to / List presented to:             Status of service:  Completed, signed off Medicare Important Message given?  NA - LOS <3 / Initial given by admissions (If response is "NO", the following Medicare IM given date fields will be blank) Date Medicare IM given:   Date Additional Medicare IM given:    Discharge Disposition:  SKILLED NURSING FACILITY  Per UR Regulation:  Reviewed for med. necessity/level of care/duration of stay  If discussed at Long Length of Stay Meetings, dates discussed:

## 2014-05-16 NOTE — Progress Notes (Signed)
Subjective: 2 Days Post-Op Procedure(s) (LRB): LEFT TOTAL KNEE ARTHROPLASTY (Left) Patient reports pain as 1 on 0-10 scale.  Minimal pain.  No nausea/vomiting, lightheadedness/dizziness.  Positive flatus and bm.  Tolerating diet.  Objective: Vital signs in last 24 hours: Temp:  [98.2 F (36.8 C)-98.4 F (36.9 C)] 98.4 F (36.9 C) (06/12 0631) Pulse Rate:  [76-81] 80 (06/12 0631) Resp:  [16-18] 16 (06/12 0631) BP: (155-167)/(69-77) 158/69 mmHg (06/12 0631) SpO2:  [94 %-98 %] 98 % (06/12 0631)  Intake/Output from previous day: 06/11 0701 - 06/12 0700 In: 600 [P.O.:600] Out: -  Intake/Output this shift:     Recent Labs  05/15/14 0650 05/16/14 0610  HGB 11.8* 9.9*    Recent Labs  05/15/14 0650 05/16/14 0610  WBC 7.6 14.0*  RBC 4.18 3.59*  HCT 34.2* 29.4*  PLT 209 210    Recent Labs  05/15/14 0650 05/16/14 0610  NA 141 140  K 4.2 4.6  CL 104 103  CO2 23 25  BUN 15 21  CREATININE 0.89 1.03  GLUCOSE 158* 124*  CALCIUM 9.1 9.2   No results found for this basename: LABPT, INR,  in the last 72 hours  Neurologically intact Neurovascular intact Sensation intact distally Intact pulses distally Dorsiflexion/Plantar flexion intact Incision: scant drainage No cellulitis present Compartment soft Dressing changed by me today  Assessment/Plan: 2 Days Post-Op Procedure(s) (LRB): LEFT TOTAL KNEE ARTHROPLASTY (Left) Advance diet Up with therapy D/C IV fluids Discharge to SNF tomorrow WBAT LLE Dry dressing change prn Please apply ted hose to LLE  ANTON, M. LINDSEY 05/16/2014, 7:37 AM

## 2014-05-17 LAB — BASIC METABOLIC PANEL
BUN: 21 mg/dL (ref 6–23)
CHLORIDE: 104 meq/L (ref 96–112)
CO2: 28 mEq/L (ref 19–32)
Calcium: 9.1 mg/dL (ref 8.4–10.5)
Creatinine, Ser: 1.07 mg/dL (ref 0.50–1.10)
GFR calc non Af Amer: 50 mL/min — ABNORMAL LOW (ref >90)
GFR, EST AFRICAN AMERICAN: 58 mL/min — AB (ref >90)
GLUCOSE: 98 mg/dL (ref 70–99)
POTASSIUM: 5 meq/L (ref 3.7–5.3)
Sodium: 141 mEq/L (ref 137–147)

## 2014-05-17 LAB — CBC
HEMATOCRIT: 29.8 % — AB (ref 36.0–46.0)
HEMOGLOBIN: 9.9 g/dL — AB (ref 12.0–15.0)
MCH: 27.5 pg (ref 26.0–34.0)
MCHC: 33.2 g/dL (ref 30.0–36.0)
MCV: 82.8 fL (ref 78.0–100.0)
Platelets: 198 10*3/uL (ref 150–400)
RBC: 3.6 MIL/uL — AB (ref 3.87–5.11)
RDW: 14 % (ref 11.5–15.5)
WBC: 7.5 10*3/uL (ref 4.0–10.5)

## 2014-05-17 NOTE — Progress Notes (Signed)
Patient is medically stable for D/C to [facility] today. Per Gavin Pound weekend supervisor at [facility] patient can come today and is going to the 400 hall. Clinical Child psychotherapist (CSW) prepared D/C packet and arranged EMS for transport. CSW asked if family has been contacted and patient stated she would call her grandson and make him aware of above. Nursing is aware of above. Please reconsult if further social work needs arise. CSW signing off.   Jetta Lout, LCSWA Weekend CSW [phone]

## 2014-05-17 NOTE — Progress Notes (Signed)
     Subjective:  Patient reports pain as mild.  No complaints.    Objective:   VITALS:   Filed Vitals:   05/16/14 2000 05/17/14 0000 05/17/14 0400 05/17/14 0610  BP:    144/64  Pulse:    74  Temp:    98.5 F (36.9 C)  TempSrc:    Oral  Resp: 18 18 16 18   Weight:      SpO2: 100% 100% 100% 100%    Neurologically intact Dorsiflexion/Plantar flexion intact Incision: dressing C/D/I   Lab Results  Component Value Date   WBC 7.5 05/17/2014   HGB 9.9* 05/17/2014   HCT 29.8* 05/17/2014   MCV 82.8 05/17/2014   PLT 198 05/17/2014     Assessment/Plan: 3 Days Post-Op   Active Problems:   DJD (degenerative joint disease) of knee   Advance diet Up with therapy Discharge to SNF DC summary updated.  History of GI Bleed, ASA only for chemoprophylaxis.     Kindell Strada P 05/17/2014, 8:53 AM   Teryl Lucy, MD Cell [phone]

## 2014-05-17 NOTE — Progress Notes (Signed)
Physical Therapy Treatment Patient Details Name: Maria [name] MRN: [id number] DOB: 12-Jul-1941 Today's Date: 05/17/2014    History of Present Illness s/p Lt TKA 05/14/14    PT Comments    Patient continues to require increased time for all mobility. Planning to DC today  Follow Up Recommendations  SNF     Equipment Recommendations  None recommended by PT    Recommendations for Other Services       Precautions / Restrictions Precautions Precautions: Fall;Knee Required Braces or Orthoses: Knee Immobilizer - Left Restrictions LLE Weight Bearing: Weight bearing as tolerated    Mobility  Bed Mobility Overal bed mobility: Needs Assistance       Supine to sit: Min assist;HOB elevated        Transfers Overall transfer level: Needs assistance Equipment used: Rolling walker (2 wheeled)   Sit to Stand: Min guard         General transfer comment: Patient with safe technique. Minguard for safety.   Ambulation/Gait Ambulation/Gait assistance: Min guard Ambulation Distance (Feet): 100 Feet Assistive device: Rolling walker (2 wheeled) Gait Pattern/deviations: Step-to pattern Gait velocity: decreased   General Gait Details: Patient easily distracted and had to be told to continue walking while she talked   Stairs            Wheelchair Mobility    Modified Rankin (Stroke Patients Only)       Balance                                    Cognition Arousal/Alertness: Awake/alert Behavior During Therapy: WFL for tasks assessed/performed Overall Cognitive Status: Within Functional Limits for tasks assessed                      Exercises Total Joint Exercises Quad Sets: AROM;Left;10 reps Heel Slides: AAROM;Left;10 reps    General Comments        Pertinent Vitals/Pain no apparent distress     Home Living                      Prior Function            PT Goals (current goals can now be found in the care plan  section) Progress towards PT goals: Progressing toward goals    Frequency  7X/week    PT Plan Current plan remains appropriate    Co-evaluation             End of Session Equipment Utilized During Treatment: Gait belt Activity Tolerance: Patient tolerated treatment well Patient left: in chair;with call bell/phone within reach     Time: 1438-8875 PT Time Calculation (min): 57 min  Charges:  $Therapeutic Exercise: 8-22 mins $Therapeutic Activity: 8-22 mins                    G Codes:      Fredrich Birks 05/17/2014, 1:45 PM 05/17/2014 Fredrich Birks PTA [phone] pager [phone] office

## 2014-05-19 ENCOUNTER — Non-Acute Institutional Stay (SKILLED_NURSING_FACILITY): Payer: Commercial Managed Care - HMO | Admitting: Adult Health

## 2014-05-19 ENCOUNTER — Encounter: Payer: Self-pay | Admitting: Adult Health

## 2014-05-19 DIAGNOSIS — K219 Gastro-esophageal reflux disease without esophagitis: Secondary | ICD-10-CM

## 2014-05-19 DIAGNOSIS — E876 Hypokalemia: Secondary | ICD-10-CM

## 2014-05-19 DIAGNOSIS — M1990 Unspecified osteoarthritis, unspecified site: Secondary | ICD-10-CM

## 2014-05-19 DIAGNOSIS — D62 Acute posthemorrhagic anemia: Secondary | ICD-10-CM

## 2014-05-19 DIAGNOSIS — E785 Hyperlipidemia, unspecified: Secondary | ICD-10-CM

## 2014-05-19 DIAGNOSIS — I10 Essential (primary) hypertension: Secondary | ICD-10-CM

## 2014-05-20 ENCOUNTER — Non-Acute Institutional Stay (SKILLED_NURSING_FACILITY): Payer: Commercial Managed Care - HMO | Admitting: Internal Medicine

## 2014-05-20 DIAGNOSIS — M179 Osteoarthritis of knee, unspecified: Secondary | ICD-10-CM

## 2014-05-20 DIAGNOSIS — D62 Acute posthemorrhagic anemia: Secondary | ICD-10-CM

## 2014-05-20 DIAGNOSIS — I10 Essential (primary) hypertension: Secondary | ICD-10-CM

## 2014-05-20 DIAGNOSIS — E876 Hypokalemia: Secondary | ICD-10-CM | POA: Insufficient documentation

## 2014-05-20 DIAGNOSIS — IMO0002 Reserved for concepts with insufficient information to code with codable children: Secondary | ICD-10-CM

## 2014-05-20 DIAGNOSIS — M1710 Unilateral primary osteoarthritis, unspecified knee: Secondary | ICD-10-CM

## 2014-05-20 NOTE — Progress Notes (Signed)
HISTORY & PHYSICAL  DATE: 05/20/2014   FACILITY: [name]  LEVEL OF CARE: SNF (31)  ALLERGIES:  Allergies  Allergen Reactions  . Coumadin [Warfarin Sodium]     Ulcers  . Methocarbamol     Hallucinations  . Norco [Hydrocodone-Acetaminophen]     Hallucinations    CHIEF COMPLAINT:  Manage left knee osteoarthritis, acute blood loss anemia and hypertension  HISTORY OF PRESENT ILLNESS: Patient is a 73 year old African American female.  KNEE OSTEOARTHRITIS: Patient had a history of pain and functional disability in the knee due to end-stage osteoarthritis and has failed nonsurgical conservative treatments. Patient had worsening of pain with activity and weight bearing, pain that interfered with activities of daily living & pain with passive range of motion. Therefore patient underwent total knee arthroplasty and tolerated the procedure well. Patient is admitted to this facility for sort short-term rehabilitation. Patient denies knee pain.  ANEMIA: The anemia has been stable. The patient denies fatigue, melena or hematochezia. No complications from the medications currently being used. Postoperatively the patient suffered an acute blood loss anemia. She did not require transfusion.  HTN: Pt 's HTN remains stable.  Denies CP, sob, DOE, pedal edema, headaches, dizziness or visual disturbances.  No complications from the medications currently being used.  Last BP : 146/78.  PAST MEDICAL HISTORY :  Past Medical History  Diagnosis Date  . Hyperlipidemia   . Hypertension   . Peripheral vascular disease   . History of gastrointestinal hemorrhage   . GERD (gastroesophageal reflux disease)   . Bronchitis     hx of  . Arthritis   . History of bleeding ulcers   . Osteoarthritis of right knee 06/03/2012    PAST SURGICAL HISTORY: Past Surgical History  Procedure Laterality Date  . Abdominal hysterectomy      nonmalignant reasons  . Total knee arthroplasty   05/30/2012    Procedure: TOTAL KNEE ARTHROPLASTY;  Surgeon: Loreta Aveaniel F Murphy, MD;  [facility];  Service: Orthopedics;  Laterality: Right;  DR MURPHY WANTS 90 MINUTES FOR THIS CASE. OSTEONICS  . Appendectomy    . Total knee arthroplasty Left 05/14/2014    Procedure: LEFT TOTAL KNEE ARTHROPLASTY;  Surgeon: Loreta Aveaniel F Murphy, MD;  [facility];  Service: Orthopedics;  Laterality: Left;    SOCIAL HISTORY:  reports that she has never smoked. She has never used smokeless tobacco. She reports that she does not drink alcohol or use illicit drugs.  FAMILY HISTORY:  Family History  Problem Relation Age of Onset  . Heart disease Mother   . Other Father     mm other    CURRENT MEDICATIONS: Reviewed per MAR/see medication list  REVIEW OF SYSTEMS:  See HPI otherwise 14 point ROS is negative.  PHYSICAL EXAMINATION  VS:  See VS section  GENERAL: no acute distress, moderately obese body habitus EYES: conjunctivae normal, sclerae normal, normal eye lids MOUTH/THROAT: lips without lesions,no lesions in the mouth,tongue is without lesions,uvula elevates in midline NECK: supple, trachea midline, no neck masses, no thyroid tenderness, no thyromegaly LYMPHATICS: no LAN in the neck, no supraclavicular LAN RESPIRATORY: breathing is even & unlabored, BS CTAB CARDIAC: RRR, no murmur,no extra heart sounds, no edema GI:  ABDOMEN: abdomen soft, normal BS, no masses, no tenderness  LIVER/SPLEEN: no hepatomegaly, no splenomegaly MUSCULOSKELETAL: HEAD: normal to inspection  EXTREMITIES: LEFT UPPER EXTREMITY: full range of motion, normal strength & tone RIGHT UPPER EXTREMITY:  full range  of motion, normal strength & tone LEFT LOWER EXTREMITY:  In immobilizer-not tested RIGHT LOWER EXTREMITY:  full range of motion, normal strength & tone PSYCHIATRIC: the patient is alert & oriented to person, affect & behavior appropriate  LABS/RADIOLOGY:  Labs reviewed: Basic Metabolic Panel:  Recent Labs   05/15/14 0650 05/16/14 0610 05/17/14 0419  NA 141 140 141  K 4.2 4.6 5.0  CL 104 103 104  CO2 23 25 28   GLUCOSE 158* 124* 98  BUN 15 21 21   CREATININE 0.89 1.03 1.07  CALCIUM 9.1 9.2 9.1   Liver Function Tests:  Recent Labs  11/07/13 1004 05/06/14 1227  AST 16 16  ALT 13 11  ALKPHOS 113 130*  BILITOT 0.6 0.2*  PROT 7.4 7.4  ALBUMIN 3.7 3.4*   CBC:  Recent Labs  11/07/13 1004 05/06/14 1227 05/15/14 0650 05/16/14 0610 05/17/14 0419  WBC 6.7 5.4 7.6 14.0* 7.5  NEUTROABS 4.0 3.1  --   --   --   HGB 13.4 12.5 11.8* 9.9* 9.9*  HCT 39.5 37.4 34.2* 29.4* 29.8*  MCV 83.2 83.9 81.8 81.9 82.8  PLT 281.0 243 209 210 198   Lipid Panel:  Recent Labs  11/07/13 1004  HDL 50.80   CBG:  Recent Labs  05/15/14 1555  GLUCAP 282*    CHEST  2 VIEW   COMPARISON:  May 24, 2012   FINDINGS: There is no edema or consolidation. Heart is upper normal in size with normal pulmonary vascularity. There is atherosclerotic change in aorta. There is degenerative change in the thoracic spine and right shoulder regions.   IMPRESSION: No edema or consolidation. PORTABLE LEFT KNEE - 1-2 VIEW   COMPARISON:  None.   FINDINGS: Left knee prosthetic components are well-seated and aligned. There is no acute fracture or evidence of an operative complication.   IMPRESSION: Well-aligned left knee prosthesis.   ASSESSMENT/PLAN:  Left knee osteoarthritis-status post total knee arthroplasty. Continue rehabilitation. Acute blood loss anemia-check hemoglobin level Hypertension-blood pressure borderline. Will monitor. Hypokalemia-Well repleted. Continue supplementation. GERD-continue PPI Check CBC and BMP  I have reviewed patient's medical records received at admission/from hospitalization.  CPT CODE: 83419  Angela Cox, MD [facility] [phone]

## 2014-05-27 ENCOUNTER — Encounter: Payer: Self-pay | Admitting: Adult Health

## 2014-05-27 ENCOUNTER — Non-Acute Institutional Stay (SKILLED_NURSING_FACILITY): Payer: Commercial Managed Care - HMO | Admitting: Adult Health

## 2014-05-27 DIAGNOSIS — E876 Hypokalemia: Secondary | ICD-10-CM

## 2014-05-27 DIAGNOSIS — K219 Gastro-esophageal reflux disease without esophagitis: Secondary | ICD-10-CM | POA: Insufficient documentation

## 2014-05-27 NOTE — Progress Notes (Signed)
Patient ID: Maria [name], female   DOB: 03/07/1941, 73 y.o.   MRN: [id number]               PROGRESS NOTE  DATE: 05/19/2014  FACILITY: Nursing Home Location: [facility]  LEVEL OF CARE: SNF (31)  Acute Visit  CHIEF COMPLAINT:  Follow-up Hospitalization  HISTORY OF PRESENT ILLNESS: This is a 73 year old female who has been [facility]17/14 [facility] with Osteoarthritis S/P Left total knee arthroplasty. She has been admitted for a short-term rehabilitation.   REASSESSMENT OF ONGOING PROBLEM(S):  HTN: Pt 's HTN remains stable.  Denies CP, sob, DOE, pedal edema, headaches, dizziness or visual disturbances.  No complications from the medications currently being used.  Last BP : 135/76  HYPERLIPIDEMIA: No complications from the medications presently being used. 12/14 fasting lipid panel showed : Cholesterol 218 triglycerides 67.0 HDL 50.80  GERD: pt's GERD is stable.  Denies ongoing heartburn, abd. Pain, nausea or vomiting.  Currently on a PPI & tolerates it without any adverse reactions.   PAST MEDICAL HISTORY : Reviewed.  No changes/see problem list  CURRENT MEDICATIONS: Reviewed per MAR/see medication list  REVIEW OF SYSTEMS:  GENERAL: no change in appetite, no fatigue, no weight changes, no fever, chills or weakness RESPIRATORY: no cough, SOB, DOE, wheezing, hemoptysis CARDIAC: no chest pain, edema or palpitations GI: no abdominal pain, diarrhea, constipation, heart burn, nausea or vomiting  PHYSICAL EXAMINATION  GENERAL: no acute distress, normal body habitus EYES: conjunctivae normal, sclerae normal, normal eye lids NECK: supple, trachea midline, no neck masses, no thyroid tenderness, no thyromegaly LYMPHATICS: no LAN in the neck, no supraclavicular LAN RESPIRATORY: breathing is even & unlabored, BS CTAB CARDIAC: RRR, no murmur,no extra heart sounds, no edema GI: abdomen soft, normal BS, no masses, no tenderness, no  hepatomegaly, no splenomegaly EXTREMITIES:  Able to move all 4 extremities with limited ROM to LLE due to surgery PSYCHIATRIC: the patient is alert & oriented to person, affect & behavior appropriate  LABS/RADIOLOGY: Labs reviewed: Basic Metabolic Panel:  Recent Labs  40/98/1105/10/19 0650 05/16/14 0610 05/17/14 0419  NA 141 140 141  K 4.2 4.6 5.0  CL 104 103 104  CO2 23 25 28   GLUCOSE 158* 124* 98  BUN 15 21 21   CREATININE 0.89 1.03 1.07  CALCIUM 9.1 9.2 9.1   Liver Function Tests:  Recent Labs  11/07/13 1004 05/06/14 1227  AST 16 16  ALT 13 11  ALKPHOS 113 130*  BILITOT 0.6 0.2*  PROT 7.4 7.4  ALBUMIN 3.7 3.4*   CBC:  Recent Labs  11/07/13 1004 05/06/14 1227 05/15/14 0650 05/16/14 0610 05/17/14 0419  WBC 6.7 5.4 7.6 14.0* 7.5  NEUTROABS 4.0 3.1  --   --   --   HGB 13.4 12.5 11.8* 9.9* 9.9*  HCT 39.5 37.4 34.2* 29.4* 29.8*  MCV 83.2 83.9 81.8 81.9 82.8  PLT 281.0 243 209 210 198   A1C: No components found with this basename: A1C,  Lipid Panel:  Recent Labs  11/07/13 1004  HDL 50.80    CBG:  Recent Labs  05/15/14 1555  GLUCAP 282*    ASSESSMENT/PLAN:   Osteoarthritis status post left total knee arthroplasty - for rehabilitation Hyperlipidemia - continue Zocor Hypertension - well controlled; continue Tenoretic GERD - stable; continue Prilosec Hypokalemia - currently on K-Dur; latest K = 5.0; discontinue K. Dur; check BMP in one week Anemia, acute blood loss - stable; check  CBC   CPT CODE: 81859  Ella Bodo - NP [facility] [phone]

## 2014-05-27 NOTE — Progress Notes (Signed)
Patient ID: Maria [name], female   DOB: 27-Dec-1940, 73 y.o.   MRN: [id number]             PROGRESS NOTE  DATE:  05/27/14  FACILITY: Nursing Home Location: [facility]  LEVEL OF CARE: SNF (31)  Acute Visit  CHIEF COMPLAINT:  Manage Hypopotassemia  HISTORY OF PRESENT ILLNESS: This is a 73 year old female who was noted to have K = 3.3 ; low. She is currently taking Tenoretic for hypertension and had a recent Left total knee arthroplasty. She denies muscle cramping, SOB nor fatigue.  PAST MEDICAL HISTORY : Reviewed.  No changes/see problem list  CURRENT MEDICATIONS: Reviewed per MAR/see medication list  REVIEW OF SYSTEMS:  GENERAL: no change in appetite, no fatigue, no weight changes, no fever, chills or weakness RESPIRATORY: no cough, SOB, DOE, wheezing, hemoptysis CARDIAC: no chest pain, edema or palpitations GI: no abdominal pain, diarrhea, constipation, heart burn, nausea or vomiting  PHYSICAL EXAMINATION  GENERAL: no acute distress, normal body habitus NECK: supple, trachea midline, no neck masses, no thyroid tenderness, no thyromegaly LYMPHATICS: no LAN in the neck, no supraclavicular LAN RESPIRATORY: breathing is even & unlabored, BS CTAB CARDIAC: RRR, no murmur,no extra heart sounds, no edema GI: abdomen soft, normal BS, no masses, no tenderness, no hepatomegaly, no splenomegaly EXTREMITIES:  Able to move all 4 extremities PSYCHIATRIC: the patient is alert & oriented to person, affect & behavior appropriate  LABS/RADIOLOGY: 05/26/14  sodium 137 potassium 3.3 glucose 139 BUN 12 creatinine 1.2 calcium 9.4 05/20/14  WBC 7.2 hemoglobin 10.3 hematocrit 32.7 Labs reviewed: Basic Metabolic Panel:  Recent Labs  25/95/63 0650 05/16/14 0610 05/17/14 0419  NA 141 140 141  K 4.2 4.6 5.0  CL 104 103 104  CO2 23 25 28   GLUCOSE 158* 124* 98  BUN 15 21 21   CREATININE 0.89 1.03 1.07  CALCIUM 9.1 9.2 9.1   Liver Function Tests:  Recent Labs   11/07/13 1004 05/06/14 1227  AST 16 16  ALT 13 11  ALKPHOS 113 130*  BILITOT 0.6 0.2*  PROT 7.4 7.4  ALBUMIN 3.7 3.4*   CBC:  Recent Labs  11/07/13 1004 05/06/14 1227 05/15/14 0650 05/16/14 0610 05/17/14 0419  WBC 6.7 5.4 7.6 14.0* 7.5  NEUTROABS 4.0 3.1  --   --   --   HGB 13.4 12.5 11.8* 9.9* 9.9*  HCT 39.5 37.4 34.2* 29.4* 29.8*  MCV 83.2 83.9 81.8 81.9 82.8  PLT 281.0 243 209 210 198    Lipid Panel:  Recent Labs  11/07/13 1004  HDL 50.80    CBG:  Recent Labs  05/15/14 1555  GLUCAP 282*    ASSESSMENT/PLAN:  Hypopotassemia -  Start K-dur 20 meq PO Q D; BMP in 1 week  CPT CODE: 87564  Ella Bodo - NP [facility] [phone]

## 2014-05-30 ENCOUNTER — Encounter: Payer: Self-pay | Admitting: Adult Health

## 2014-11-21 ENCOUNTER — Telehealth: Payer: Self-pay | Admitting: Family Medicine

## 2014-11-21 MED ORDER — SIMVASTATIN 20 MG PO TABS: 20.0000 mg | ORAL_TABLET | ORAL | Status: DC

## 2014-11-21 NOTE — Addendum Note (Signed)
Addended by: Kern Reap B on: 11/21/2014 11:39 AM   Modules accepted: Orders

## 2014-11-21 NOTE — Telephone Encounter (Signed)
Pt needs refill on simvastatin 20 mg #30 w/refills sent [facility]

## 2014-12-09 NOTE — Progress Notes (Signed)
This encounter was created in error - please disregard.

## 2014-12-17 ENCOUNTER — Other Ambulatory Visit: Payer: Self-pay | Admitting: Family Medicine

## 2015-02-12 ENCOUNTER — Telehealth: Payer: Self-pay | Admitting: Family Medicine

## 2015-02-12 MED ORDER — ATENOLOL-CHLORTHALIDONE 50-25 MG PO TABS
0.5000 | ORAL_TABLET | Freq: Every day | ORAL | Status: DC
Start: 2015-02-12 — End: 2015-02-18

## 2015-02-12 MED ORDER — OMEPRAZOLE 20 MG PO CPDR: 20.0000 mg | DELAYED_RELEASE_CAPSULE | Freq: Every day | ORAL | Status: DC

## 2015-02-12 NOTE — Telephone Encounter (Signed)
Rx sent 

## 2015-02-12 NOTE — Telephone Encounter (Signed)
Pt request refill of the following: atenolol-chlorthalidone (TENORETIC) 50-25 MG per tablet   Phamacy: [facility] [address]

## 2015-02-18 ENCOUNTER — Ambulatory Visit (INDEPENDENT_AMBULATORY_CARE_PROVIDER_SITE_OTHER): Payer: Commercial Managed Care - HMO | Admitting: Family Medicine

## 2015-02-18 ENCOUNTER — Encounter: Payer: Self-pay | Admitting: Family Medicine

## 2015-02-18 VITALS — BP 120/80 | Temp 98.3°F | Ht 61.0 in | Wt 164.0 lb

## 2015-02-18 DIAGNOSIS — Z0000 Encounter for general adult medical examination without abnormal findings: Secondary | ICD-10-CM

## 2015-02-18 DIAGNOSIS — I10 Essential (primary) hypertension: Secondary | ICD-10-CM

## 2015-02-18 DIAGNOSIS — E785 Hyperlipidemia, unspecified: Secondary | ICD-10-CM

## 2015-02-18 LAB — CBC WITH DIFFERENTIAL/PLATELET
Basophils Absolute: 0 10*3/uL (ref 0.0–0.1)
Basophils Relative: 0.7 % (ref 0.0–3.0)
EOS ABS: 0.1 10*3/uL (ref 0.0–0.7)
Eosinophils Relative: 2.5 % (ref 0.0–5.0)
HCT: 40.7 % (ref 36.0–46.0)
Hemoglobin: 13.6 g/dL (ref 12.0–15.0)
LYMPHS ABS: 1.7 10*3/uL (ref 0.7–4.0)
Lymphocytes Relative: 28.8 % (ref 12.0–46.0)
MCHC: 33.5 g/dL (ref 30.0–36.0)
MCV: 82.1 fl (ref 78.0–100.0)
Monocytes Absolute: 0.5 10*3/uL (ref 0.1–1.0)
Monocytes Relative: 8.3 % (ref 3.0–12.0)
Neutro Abs: 3.5 10*3/uL (ref 1.4–7.7)
Neutrophils Relative %: 59.7 % (ref 43.0–77.0)
Platelets: 256 10*3/uL (ref 150.0–400.0)
RBC: 4.96 Mil/uL (ref 3.87–5.11)
RDW: 15.6 % — ABNORMAL HIGH (ref 11.5–15.5)
WBC: 5.8 10*3/uL (ref 4.0–10.5)

## 2015-02-18 LAB — HEPATIC FUNCTION PANEL
ALBUMIN: 3.9 g/dL (ref 3.5–5.2)
ALT: 12 U/L (ref 0–35)
AST: 16 U/L (ref 0–37)
Alkaline Phosphatase: 106 U/L (ref 39–117)
BILIRUBIN TOTAL: 0.6 mg/dL (ref 0.2–1.2)
Bilirubin, Direct: 0.1 mg/dL (ref 0.0–0.3)
Total Protein: 7.3 g/dL (ref 6.0–8.3)

## 2015-02-18 LAB — POCT URINALYSIS DIPSTICK
Bilirubin, UA: NEGATIVE
Blood, UA: NEGATIVE
Glucose, UA: NEGATIVE
Ketones, UA: NEGATIVE
LEUKOCYTES UA: NEGATIVE
NITRITE UA: NEGATIVE
PROTEIN UA: NEGATIVE
Spec Grav, UA: 1.01
UROBILINOGEN UA: 0.2
pH, UA: 8

## 2015-02-18 LAB — BASIC METABOLIC PANEL
BUN: 18 mg/dL (ref 6–23)
CALCIUM: 10 mg/dL (ref 8.4–10.5)
CO2: 33 mEq/L — ABNORMAL HIGH (ref 19–32)
CREATININE: 1.07 mg/dL (ref 0.40–1.20)
Chloride: 102 mEq/L (ref 96–112)
GFR: 64.29 mL/min (ref >60.00)
Glucose, Bld: 92 mg/dL (ref 70–99)
Potassium: 3.9 mEq/L (ref 3.5–5.1)
Sodium: 139 mEq/L (ref 135–145)

## 2015-02-18 LAB — TSH: TSH: 1.42 u[IU]/mL (ref 0.35–4.50)

## 2015-02-18 LAB — LIPID PANEL
CHOL/HDL RATIO: 4
CHOLESTEROL: 204 mg/dL — AB (ref 0–200)
HDL: 50.8 mg/dL (ref >39.00)
LDL Cholesterol: 132 mg/dL — ABNORMAL HIGH (ref 0–99)
NonHDL: 153.2
Triglycerides: 107 mg/dL (ref 0.0–149.0)
VLDL: 21.4 mg/dL (ref 0.0–40.0)

## 2015-02-18 MED ORDER — POTASSIUM CHLORIDE CRYS ER 20 MEQ PO TBCR: 20.0000 meq | EXTENDED_RELEASE_TABLET | Freq: Two times a day (BID) | ORAL | Status: DC

## 2015-02-18 MED ORDER — SIMVASTATIN 20 MG PO TABS: 20.0000 mg | ORAL_TABLET | ORAL | Status: DC

## 2015-02-18 MED ORDER — OMEPRAZOLE 20 MG PO CPDR: 20.0000 mg | DELAYED_RELEASE_CAPSULE | Freq: Every day | ORAL | Status: DC

## 2015-02-18 MED ORDER — ATENOLOL-CHLORTHALIDONE 50-25 MG PO TABS: 0.5000 | ORAL_TABLET | Freq: Every day | ORAL | Status: DC

## 2015-02-18 NOTE — Patient Instructions (Signed)
Continue current medications  Follow-up in one year sooner if any problems  [name]'s extension is [phone].........Marland Kitchen If you can get the shingles vaccine here call and leave her a message,,,,,,, leave your name date of birth call back number

## 2015-02-18 NOTE — Progress Notes (Signed)
   Subjective:    Patient ID: Maria [name], female    DOB: 02-16-1941, 74 y.o.   MRN: [id number]  HPI  Maria [name] is a 74 year old single female nonsmoker who comes in today for general physical examination because of a history of hypertension , osteoarthritis,,,,,,, status post right and left knee replacements,,,,, and hyperlipidemia   in 2008 she had a GI bleed. She does not take any aspirin nor aspirin products since that time.  She gets routine eye care, dental care, BSE monthly, annual mammography, colonoscopy and GI  Vaccinations up-to-date except she's doing shingles vaccine........   Cognitive function normal she goes to the [facility] daily, home health safety reviewed no issues identified, no guns in the house, she does have a healthcare power of attorney and living well   Review of Systems  Constitutional: Negative.   HENT: Negative.   Eyes: Negative.   Respiratory: Negative.   Cardiovascular: Negative.   Gastrointestinal: Negative.   Endocrine: Negative.   Genitourinary: Negative.   Musculoskeletal: Negative.   Skin: Negative.   Allergic/Immunologic: Negative.   Neurological: Negative.   Hematological: Negative.   Psychiatric/Behavioral: Negative.        Objective:   Physical Exam  Constitutional: She is oriented to person, place, and time. She appears well-developed and well-nourished.  HENT:  Head: Normocephalic and atraumatic.  Right Ear: External ear normal.  Left Ear: External ear normal.  Nose: Nose normal.  Mouth/Throat: Oropharynx is clear and moist.  Eyes: EOM are normal. Pupils are equal, round, and reactive to light.  Neck: Normal range of motion. Neck supple. No JVD present. No tracheal deviation present. No thyromegaly present.  Cardiovascular: Normal rate, regular rhythm, normal heart sounds and intact distal pulses.  Exam reveals no gallop and no friction rub.   No murmur heard. Pulmonary/Chest: Effort normal and breath sounds normal. No stridor. No  respiratory distress. She has no wheezes. She has no rales. She exhibits no tenderness.  Abdominal: Soft. Bowel sounds are normal. She exhibits no distension and no mass. There is no tenderness. There is no rebound and no guarding.  Genitourinary:   Bilateral breast exam normal   Pelvic exam not indicated she had her uterus and ovaries removed for nonmalignant reasons many years ago  Musculoskeletal: Normal range of motion.  Lymphadenopathy:    She has no cervical adenopathy.  Neurological: She is alert and oriented to person, place, and time. She has normal reflexes. No cranial nerve deficit. She exhibits normal muscle tone. Coordination normal.  Skin: Skin is warm and dry. No rash noted. No erythema. No pallor.  Psychiatric: She has a normal mood and affect. Her behavior is normal. Judgment and thought content normal.  Nursing note and vitals reviewed.         Assessment & Plan:   healthy female  Hypertension ago continue current therapy  Reflux esophagitis continue Prilosec and avoid all aspirin and aspirin products and she had a history of a GI bleed in 2008.   Hyperlipidemia continue Zocor   Status post right and left total knee replacements......Marland Kitchen Most recent June 2015 doing well no complications

## 2015-02-18 NOTE — Progress Notes (Signed)
Pre visit review using our clinic review tool, if applicable. No additional management support is needed unless otherwise documented below in the visit note. 

## 2015-02-19 ENCOUNTER — Telehealth: Payer: Self-pay | Admitting: Family Medicine

## 2015-02-19 NOTE — Telephone Encounter (Signed)
emmi emailed °

## 2015-09-03 LAB — HM MAMMOGRAPHY

## 2015-09-10 ENCOUNTER — Encounter: Payer: Self-pay | Admitting: Family Medicine

## 2016-02-03 ENCOUNTER — Encounter: Payer: Self-pay | Admitting: Gastroenterology

## 2016-02-26 ENCOUNTER — Other Ambulatory Visit: Payer: Self-pay | Admitting: Family Medicine

## 2016-03-09 ENCOUNTER — Telehealth: Payer: Self-pay | Admitting: Family Medicine

## 2016-03-09 NOTE — Telephone Encounter (Signed)
error 

## 2016-03-17 ENCOUNTER — Other Ambulatory Visit: Payer: Self-pay | Admitting: Family Medicine

## 2016-03-28 ENCOUNTER — Telehealth: Payer: Self-pay | Admitting: Family Medicine

## 2016-03-28 NOTE — Telephone Encounter (Signed)
Left message on machine returning patient's call for more information.

## 2016-03-28 NOTE — Telephone Encounter (Signed)
Spoke with patient and she will keep her appointment tomorrow.

## 2016-03-28 NOTE — Telephone Encounter (Signed)
Pt need for you to call her very important and personal did not want to talk about it.

## 2016-03-29 ENCOUNTER — Ambulatory Visit (INDEPENDENT_AMBULATORY_CARE_PROVIDER_SITE_OTHER)
Admission: RE | Admit: 2016-03-29 | Discharge: 2016-03-29 | Disposition: A | Discharge status: Home / Self Care | Payer: Medicare Other | Source: Ambulatory Visit | Attending: Family Medicine | Admitting: Family Medicine

## 2016-03-29 ENCOUNTER — Encounter: Payer: Self-pay | Admitting: Family Medicine

## 2016-03-29 ENCOUNTER — Ambulatory Visit (INDEPENDENT_AMBULATORY_CARE_PROVIDER_SITE_OTHER): Payer: Medicare Other | Admitting: Family Medicine

## 2016-03-29 VITALS — BP 144/80 | HR 78 | Temp 97.9°F | Resp 12 | Ht 61.0 in | Wt 164.9 lb

## 2016-03-29 DIAGNOSIS — R053 Chronic cough: Secondary | ICD-10-CM

## 2016-03-29 DIAGNOSIS — I10 Essential (primary) hypertension: Secondary | ICD-10-CM

## 2016-03-29 DIAGNOSIS — K219 Gastro-esophageal reflux disease without esophagitis: Secondary | ICD-10-CM

## 2016-03-29 DIAGNOSIS — E7800 Pure hypercholesterolemia, unspecified: Secondary | ICD-10-CM

## 2016-03-29 DIAGNOSIS — E876 Hypokalemia: Secondary | ICD-10-CM | POA: Diagnosis not present

## 2016-03-29 DIAGNOSIS — R05 Cough: Secondary | ICD-10-CM

## 2016-03-29 MED ORDER — ATENOLOL-CHLORTHALIDONE 50-25 MG PO TABS: 0.5000 | ORAL_TABLET | Freq: Every day | ORAL | Status: DC

## 2016-03-29 MED ORDER — OMEPRAZOLE 20 MG PO CPDR: 20.0000 mg | DELAYED_RELEASE_CAPSULE | Freq: Every day | ORAL | Status: DC

## 2016-03-29 MED ORDER — POTASSIUM CHLORIDE CRYS ER 20 MEQ PO TBCR: 20.0000 meq | EXTENDED_RELEASE_TABLET | Freq: Two times a day (BID) | ORAL | Status: DC

## 2016-03-29 NOTE — Patient Instructions (Addendum)
Pre visit review using our clinic review tool, if applicable. No additional management support is needed unless otherwise documented below in the visit note.   A few things to remember from today's visit:   1. Gastroesophageal reflux disease without esophagitis  - omeprazole (PRILOSEC) 20 MG capsule; Take 1 capsule (20 mg total) by mouth daily.  Dispense: 90 capsule; Refill: 1  2. Essential hypertension  - COMPLETE METABOLIC PANEL WITH GFR; Future - TSH; Future - atenolol-chlorthalidone (TENORETIC) 50-25 MG tablet; Take 0.5 tablets by mouth daily.  Dispense: 45 tablet; Refill: 1  3. Hypopotassemia  - COMPLETE METABOLIC PANEL WITH GFR; Future - potassium chloride SA (K-DUR,KLOR-CON) 20 MEQ tablet; Take 1 tablet (20 mEq total) by mouth 2 (two) times daily.  Dispense: 180 tablet; Refill: 1  4. Cough, persistent  - DG Chest 2 View; Future  5. Hypercholesteremia  - Lipid Panel; Future - COMPLETE METABOLIC PANEL WITH GFR; Future   DASH diet recommended: high in vegetables, fruits, low-fat dairy products, whole grains, poultry, fish, and nuts; and low in sweets, sugar-sweetened beverages, and red meats.  Regular exercise to start as planned and healthy diet to continue.    If you sign-up for My chart, you can communicate easier with [place] in case you have any question or concern.

## 2016-03-29 NOTE — Progress Notes (Signed)
Subjective:    Patient ID: Maria [name], female    DOB: 04-20-1941, 75 y.o.   MRN: [id number]  HPI  [name] is a 75 y.o.female here today to establish care with me.  She has Hx of HTN,HLD,OA,and GERD. She has not had labs in over a year.  Reviewing her medications she is on Vit E, which she takes for "energy" and Aspirin 81 mg daily.  I noted persistent cough during examination and asthma listed on her problem list. According to pt, she has had cough for a while now and was told she had a "little bit" of asthma. Exacerbated by laughing. Not sure about wheezing. No exertional dyspnea associated.  GERD: Hx of "bleeding ulcers" in 2002, she is on Omeprazole 20 mg 3 times per week.  She has not noted abdominal pain, nausea, vomiting, or heartburn.  Hypertension: Currently she is on Atenolol-Chlorthalidone 50-25 mg 1/2 tab daily. She is taking medications as instructed, no side effects reported.  She has not noted unusual headache, visual changes, exertional chest pain, dyspnea,  focal weakness, claudication,or edema. HypoK+ she is on KCL 20 meq bid.   Lab Results  Component Value Date   CREATININE 1.07 02/18/2015   BUN 18 02/18/2015   NA 139 02/18/2015   K 3.9 02/18/2015   CL 102 02/18/2015   CO2 33* 02/18/2015    HLD: She is on Simvastatin 20 mg daily. She tries to follow a low fat diet. Planning on starting regular exercise at the [facility] now that she is working part time.  No Hx of CVA or CAD. She is on Aspirin 81 mg.   Lab Results  Component Value Date   CHOL 204* 02/18/2015   HDL 50.80 02/18/2015   LDLCALC 132* 02/18/2015   LDLDIRECT 156.9 11/07/2013   TRIG 107.0 02/18/2015   CHOLHDL 4 02/18/2015    Review of Systems  Constitutional: Positive for fatigue (mild, not more than usual). Negative for fever, activity change, appetite change and unexpected weight change.  HENT: Positive for sneezing (occasional) and voice change. Negative for  congestion, mouth sores, nosebleeds and trouble swallowing.   Eyes: Negative for redness, itching and visual disturbance.  Respiratory: Positive for cough and wheezing (unsure). Negative for apnea and shortness of breath.   Cardiovascular: Negative for chest pain, palpitations and leg swelling.  Gastrointestinal: Negative for nausea, vomiting, abdominal pain and blood in stool.       Negative for changes in bowel habits.  Genitourinary: Negative for dysuria, hematuria, decreased urine volume and difficulty urinating.  Musculoskeletal: Positive for arthralgias (Hx of OA). Negative for joint swelling and neck pain.  Skin: Positive for rash (not new, pruritic on upper extremities, ? eczema).  Allergic/Immunologic: Negative for environmental allergies.  Neurological: Negative for seizures, syncope, weakness, numbness and headaches.  Hematological: Negative for adenopathy.  Psychiatric/Behavioral: Negative for confusion and sleep disturbance. The patient is not nervous/anxious.      Current Outpatient Prescriptions on File Prior to Visit  Medication Sig Dispense Refill  . Calcium Carbonate-Vitamin D (CALCIUM PLUS VITAMIN D PO) Take 2.5 tablets by mouth daily.     . simvastatin (ZOCOR) 20 MG tablet Take 1 tablet (20 mg total) by mouth 3 (three) times a week. take 1 tablet by mouth at bedtime 100 tablet 3   No current facility-administered medications on file prior to visit.     Past Medical History  Diagnosis Date  . Hyperlipidemia   . Hypertension   . Peripheral  vascular disease (HCC)   . History of gastrointestinal hemorrhage   . GERD (gastroesophageal reflux disease)   . Bronchitis     hx of  . Arthritis   . History of bleeding ulcers   . Osteoarthritis of right knee 06/03/2012    Social History   Social History  . Marital Status: Divorced    Spouse Name: N/A  . Number of Children: N/A  . Years of Education: N/A   Social History Main Topics  . Smoking status: Never Smoker    . Smokeless tobacco: Never Used  . Alcohol Use: No  . Drug Use: No  . Sexual Activity: Not Asked   Other Topics Concern  . None   Social History Narrative    Filed Vitals:   03/29/16 1418  BP: 144/80  Pulse: 78  Temp: 97.9 F (36.6 C)  Resp: 12   Body mass index is 31.17 kg/(m^2).      Objective:   Physical Exam  Constitutional: She is oriented to person, place, and time. She appears well-developed. No distress.  HENT:  Head: Atraumatic.  Mouth/Throat: Oropharynx is clear and moist and mucous membranes are normal.  Eyes: Conjunctivae and EOM are normal. Pupils are equal, round, and reactive to light.  Neck: Thyromegaly (mild, no nodules appreciated) present.  Cardiovascular: Normal rate and regular rhythm.   No murmur heard. DP present bilateral.  Pulmonary/Chest: Effort normal and breath sounds normal. No respiratory distress. She has no wheezes. She has no rales.  Coughing a few times during exam.  Abdominal: Soft. She exhibits no mass. There is no hepatosplenomegaly. There is no tenderness.  Musculoskeletal: She exhibits no edema or tenderness.  Lymphadenopathy:    She has no cervical adenopathy.  Neurological: She is alert and oriented to person, place, and time. She has normal strength. Coordination and gait normal.  Skin: Skin is warm. No erythema.  Hyperpigmented macular area/dry on left 5th finger (dorsal aspect).  Psychiatric: She has a normal mood and affect.  Well groomed, good eye contact.  Nursing note and vitals reviewed.      Assessment & Plan:    Sira was seen today for establish care.  Diagnoses and all orders for this visit:  Gastroesophageal reflux disease without esophagitis GERD precautions discussed. PPI side effects also reviewed but sine cough could be related to GERD, recommended increasing Omeprazole dose and frequency, 20 mg bid for 3-4 weeks. F/U in 4-6 weeks.  -     omeprazole (PRILOSEC) 20 MG capsule; Take 1 capsule (20 mg  total) by mouth daily.  Essential hypertension        Re-checked BP 142/80.Home BP reported as "fine', so no changes in current management. Continue monitoring BP at home.  DASH diet recommended. Eye exam recommended annually. F/U in 5-6 months, before if needed.  -     COMPLETE METABOLIC PANEL WITH GFR; Future -     TSH; Future -     atenolol-chlorthalidone (TENORETIC) 50-25 MG tablet; Take 0.5 tablets by mouth daily.  Hypopotassemia No changes in current management, will follow labs done today and will give further recommendations accordingly.  -     COMPLETE METABOLIC PANEL WITH GFR; Future -     potassium chloride SA (K-DUR,KLOR-CON) 20 MEQ tablet; Take 1 tablet (20 mEq total) by mouth 2 (two) times daily.  Cough, persistent  We discussed possible causes: allergies,GERD,residual symptoms after recent URI among some. I am adjusting GERD treatment first. CXR ordered. If it persists and no  better spirometry may be considered.  -     DG Chest 2 View; Future  Hypercholesteremia Low fat diet. No changes on Simvastatin. Future lab placed. I will follow labs done today and will give further recommendations accordingly. F/U in 6-12 months.  -     Lipid Panel; Future -     COMPLETE METABOLIC PANEL WITH GFR; Future       -Patient advised to return or notify a doctor immediately if symptoms worsen or persist or new concerns arise.  Abbegale Stehle G. [place], MD  [facility]. [place] office.

## 2016-04-01 ENCOUNTER — Other Ambulatory Visit (INDEPENDENT_AMBULATORY_CARE_PROVIDER_SITE_OTHER): Payer: Medicare Other

## 2016-04-01 DIAGNOSIS — E7800 Pure hypercholesterolemia, unspecified: Secondary | ICD-10-CM | POA: Diagnosis not present

## 2016-04-01 DIAGNOSIS — E876 Hypokalemia: Secondary | ICD-10-CM | POA: Diagnosis not present

## 2016-04-01 DIAGNOSIS — I10 Essential (primary) hypertension: Secondary | ICD-10-CM

## 2016-04-01 LAB — LIPID PANEL
CHOL/HDL RATIO: 5
CHOLESTEROL: 207 mg/dL — AB (ref 0–200)
HDL: 41.4 mg/dL (ref >39.00)
LDL CALC: 145 mg/dL — AB (ref 0–99)
NonHDL: 165.32
Triglycerides: 102 mg/dL (ref 0.0–149.0)
VLDL: 20.4 mg/dL (ref 0.0–40.0)

## 2016-04-01 LAB — TSH: TSH: 1.28 u[IU]/mL (ref 0.35–4.50)

## 2016-04-02 LAB — COMPLETE METABOLIC PANEL WITH GFR
ALBUMIN: 3.7 g/dL (ref 3.6–5.1)
ALT: 9 U/L (ref 6–29)
AST: 15 U/L (ref 10–35)
Alkaline Phosphatase: 102 U/L (ref 33–130)
BUN: 20 mg/dL (ref 7–25)
CHLORIDE: 97 mmol/L — AB (ref 98–110)
CO2: 31 mmol/L (ref 20–31)
Calcium: 10.1 mg/dL (ref 8.6–10.4)
Creat: 1.17 mg/dL — ABNORMAL HIGH (ref 0.60–0.93)
GFR, Est African American: 52 mL/min — ABNORMAL LOW (ref >=60)
GFR, Est Non African American: 45 mL/min — ABNORMAL LOW (ref >=60)
GLUCOSE: 111 mg/dL — AB (ref 65–99)
Potassium: 3.3 mmol/L — ABNORMAL LOW (ref 3.5–5.3)
Sodium: 140 mmol/L (ref 135–146)
TOTAL PROTEIN: 6.8 g/dL (ref 6.1–8.1)
Total Bilirubin: 0.7 mg/dL (ref 0.2–1.2)

## 2016-04-04 ENCOUNTER — Other Ambulatory Visit: Payer: Self-pay | Admitting: Family Medicine

## 2016-04-04 ENCOUNTER — Telehealth: Payer: Self-pay | Admitting: Family Medicine

## 2016-04-04 DIAGNOSIS — E876 Hypokalemia: Secondary | ICD-10-CM

## 2016-04-04 NOTE — Telephone Encounter (Signed)
Pt returning your call. Please call back °

## 2016-04-05 NOTE — Telephone Encounter (Signed)
Returned pt call routed result note to PCP for further advise

## 2016-04-07 NOTE — Telephone Encounter (Signed)
Spoke to pt, told her Dr. [place] would like me to review your medications. Pt said she is unable to talk now due to in a meeting and asked if I can call her back at 4:00-4:15 pm. Told pt yes, I will call back.

## 2016-04-07 NOTE — Telephone Encounter (Signed)
On her med list she has Tenoretic, which is a combination of Atenolol and Chorthalidone. She has hypokalemia and currently she is on KCL 40 meq daily. I do not want to increase KCL instead I wanted to change her meds but she states that she is not on Chlorthalidone.Do you mind going through her meds and verify if she is fact is still taking Tenoretic. Thanks, BJ

## 2016-04-07 NOTE — Telephone Encounter (Signed)
Spoke to pt, clarified her medications that are on her list and they are correct. Pt is taking Atenolol-Chlorthalidone (Tenoretic). Pt just calls it Atenolol.

## 2016-04-11 NOTE — Telephone Encounter (Signed)
So This "Atenolol" medication has Chlorthalidone, which can contribute to low K+. I wanted to changed combination tab (which she is taking) to just Atenolol and recheck BP in 4-6 weeks (could be nurse visit)+ labs (Potassium). Thanks! BJ

## 2016-04-12 ENCOUNTER — Other Ambulatory Visit: Payer: Commercial Managed Care - HMO

## 2016-04-12 ENCOUNTER — Telehealth: Payer: Self-pay

## 2016-04-12 ENCOUNTER — Other Ambulatory Visit (INDEPENDENT_AMBULATORY_CARE_PROVIDER_SITE_OTHER): Payer: Medicare Other

## 2016-04-12 DIAGNOSIS — E876 Hypokalemia: Secondary | ICD-10-CM

## 2016-04-12 LAB — BASIC METABOLIC PANEL
BUN: 15 mg/dL (ref 6–23)
CALCIUM: 9.6 mg/dL (ref 8.4–10.5)
CO2: 33 mEq/L — ABNORMAL HIGH (ref 19–32)
Chloride: 101 mEq/L (ref 96–112)
Creatinine, Ser: 1.13 mg/dL (ref 0.40–1.20)
GFR: 60.18 mL/min (ref >60.00)
Glucose, Bld: 106 mg/dL — ABNORMAL HIGH (ref 70–99)
POTASSIUM: 4.2 meq/L (ref 3.5–5.1)
Sodium: 141 mEq/L (ref 135–145)

## 2016-04-12 NOTE — Telephone Encounter (Signed)
Please advise Atenolol directions.

## 2016-04-12 NOTE — Telephone Encounter (Signed)
Left message informing patient of normal K+ and renal function.

## 2016-04-12 NOTE — Telephone Encounter (Signed)
-----   Message from Betty G [place], MD sent at 04/12/2016 11:54 AM EDT ----- Normal K+ and normal renal function. Thanks.

## 2016-04-18 ENCOUNTER — Encounter: Payer: Commercial Managed Care - HMO | Admitting: Family Medicine

## 2016-04-25 ENCOUNTER — Telehealth: Payer: Self-pay | Admitting: Family Medicine

## 2016-04-25 NOTE — Telephone Encounter (Signed)
Pt would like to know if she is to have a 4-6 week return visit and lab work this month?

## 2016-04-26 NOTE — Telephone Encounter (Signed)
When does she need to follow up?

## 2016-04-26 NOTE — Telephone Encounter (Signed)
In your notes you reccommended for pt to f/u within 4-6 weeks, does she need any labs?

## 2016-04-26 NOTE — Telephone Encounter (Signed)
This was recommended to re-check BP if she changed BP medications as initially recommended due to hypoK+. It was a confusion about which medication she was on, which is a combination tab (Atenolol+ Chlorthalidone). Lab was repeated already and K+ normal, I think she is taking same antihypertensive medication and KCL, so I do not think it is necessary.  Thanks BJ

## 2016-04-26 NOTE — Telephone Encounter (Signed)
If cough is better, tolerating Omeprazole well (OV 03/29/16), and no new concerns she can follow in July or August. Thanks, BJ

## 2016-04-29 NOTE — Telephone Encounter (Signed)
Left detailed message.   

## 2016-05-26 ENCOUNTER — Other Ambulatory Visit: Payer: Self-pay | Admitting: Family Medicine

## 2016-09-05 DIAGNOSIS — Z1231 Encounter for screening mammogram for malignant neoplasm of breast: Secondary | ICD-10-CM | POA: Diagnosis not present

## 2016-09-05 LAB — HM MAMMOGRAPHY

## 2016-09-29 ENCOUNTER — Ambulatory Visit (INDEPENDENT_AMBULATORY_CARE_PROVIDER_SITE_OTHER): Payer: Medicare Other | Admitting: Family Medicine

## 2016-09-29 ENCOUNTER — Encounter: Payer: Self-pay | Admitting: Family Medicine

## 2016-09-29 VITALS — BP 138/88 | HR 98 | Temp 98.3°F | Ht 60.5 in | Wt 156.8 lb

## 2016-09-29 DIAGNOSIS — K219 Gastro-esophageal reflux disease without esophagitis: Secondary | ICD-10-CM

## 2016-09-29 DIAGNOSIS — Z8719 Personal history of other diseases of the digestive system: Secondary | ICD-10-CM | POA: Insufficient documentation

## 2016-09-29 DIAGNOSIS — Z23 Encounter for immunization: Secondary | ICD-10-CM | POA: Diagnosis not present

## 2016-09-29 DIAGNOSIS — E785 Hyperlipidemia, unspecified: Secondary | ICD-10-CM

## 2016-09-29 DIAGNOSIS — I10 Essential (primary) hypertension: Secondary | ICD-10-CM | POA: Diagnosis not present

## 2016-09-29 NOTE — Progress Notes (Signed)
Subjective:  Maria [name] is a 75 y.o. year old very pleasant female patient who presents for/with See problem oriented charting. Also to establish care from Dr. Tawanna Cooler- reviewed and updated full history.  ROS- No chest pain or shortness of breath. No headache or blurry vision. see any ROS included in HPI as well.   Past Medical History-  Patient Active Problem List   Diagnosis Date Noted  . GERD (gastroesophageal reflux disease) 05/27/2014    Priority: Medium  . Hyperlipidemia LDL goal <100 08/21/2008    Priority: Medium  . Essential hypertension 08/21/2008    Priority: Medium  . Osteoarthritis of right knee 06/03/2012    Priority: Low  . History of gastrointestinal hemorrhage     Medications- reviewed and updated Current Outpatient Prescriptions  Medication Sig Dispense Refill  . atenolol-chlorthalidone (TENORETIC) 50-25 MG tablet Take 0.5 tablets by mouth daily. 45 tablet 1  . Calcium Carbonate-Vitamin D (CALCIUM PLUS VITAMIN D PO) Take 2.5 tablets by mouth daily.     Marland Kitchen omeprazole (PRILOSEC) 20 MG capsule Take 1 capsule (20 mg total) by mouth daily. 90 capsule 1  . potassium chloride SA (K-DUR,KLOR-CON) 20 MEQ tablet Take 1 tablet (20 mEq total) by mouth 2 (two) times daily. 180 tablet 1  . simvastatin (ZOCOR) 20 MG tablet take 1 tablet by mouth THREE TIMES A WEEK AT BEDTIME 100 tablet 1   Objective: BP 138/88   Pulse 98   Temp 98.3 F (36.8 C) (Oral)   Ht 5' 0.5" (1.537 m)   Wt 156 lb 12.8 oz (71.1 kg)   SpO2 96%   BMI 30.12 kg/m  Gen: NAD, resting comfortably, appears younger than stated age CV: RRR no murmurs rubs or gallops Lungs: CTAB no crackles, wheeze, rhonchi  Ext: no edema  Assessment/Plan:  Essential hypertension S: controlled despite not yet taking her half tablet of atenolol-chlorthalidone yet today. Potassium twice a day.  BP Readings from Last 3 Encounters:  09/29/16 138/88  03/29/16 (!) 144/80  02/18/15 120/80  A/P:Continue current meds:  We  discussed we could try an alternate medicine that would not require potassium likely but she has done well on current regimen and prefers to keep it  Hyperlipidemia LDL goal <100 S: poorly controlled on MWF simvastatin 20mg . No myalgias. constipation in past on daily.  Lab Results  Component Value Date   CHOL 207 (H) 04/01/2016   HDL 41.40 04/01/2016   LDLCALC 145 (H) 04/01/2016   LDLDIRECT 156.9 11/07/2013   TRIG 102.0 04/01/2016   CHOLHDL 5 04/01/2016   A/P: we will titrate slowly to daily- go up to 4 days a week for now with reassess in 6 months- send in as daily- patient understands MWFSa dosing for now  GERD (gastroesophageal reflux disease) S: controlled with Prilosec 20mg . History of severe gastric ulcer bleed with hgb down to 6 in 2002. No aspirin A/P: with severity of prior bleed- likely keep long term PPI    Return in about 6 months (around 03/30/2017) for physical. awv before end of year  Orders Placed This Encounter  Procedures  . Flu vaccine HIGH DOSE PF   Return precautions advised.  Tana Conch, MD

## 2016-09-29 NOTE — Patient Instructions (Addendum)
Increase cholesterol medicine to 4 days a week. Monday, Wednesday, Friday, Saturday.   I would also like for you to sign up for an annual wellness visit within a few weeks with our nurse, Darl Pikes, who specializes in the annual wellness exam. This is a free benefit under medicare that may help [place] find additional ways to help you. Some highlights are reviewing medications, lifestyle, and doing a dementia screen.    See me in 6 months for a physical- come fasting but take your blood pressure medicine before you come

## 2016-09-29 NOTE — Assessment & Plan Note (Signed)
S: poorly controlled on MWF simvastatin 20mg . No myalgias. constipation in past on daily.  Lab Results  Component Value Date   CHOL 207 (H) 04/01/2016   HDL 41.40 04/01/2016   LDLCALC 145 (H) 04/01/2016   LDLDIRECT 156.9 11/07/2013   TRIG 102.0 04/01/2016   CHOLHDL 5 04/01/2016   A/P: we will titrate slowly to daily- go up to 4 days a week for now with reassess in 6 months- send in as daily- patient understands MWFSa dosing for now

## 2016-09-29 NOTE — Assessment & Plan Note (Signed)
S: controlled despite not yet taking her half tablet of atenolol-chlorthalidone yet today. Potassium twice a day.  BP Readings from Last 3 Encounters:  09/29/16 138/88  03/29/16 (!) 144/80  02/18/15 120/80  A/P:Continue current meds:  We discussed we could try an alternate medicine that would not require potassium likely but she has done well on current regimen and prefers to keep it

## 2016-09-29 NOTE — Progress Notes (Signed)
Pre visit review using our clinic review tool, if applicable. No additional management support is needed unless otherwise documented below in the visit note. 

## 2016-09-29 NOTE — Assessment & Plan Note (Signed)
S: controlled with Prilosec 20mg . History of severe gastric ulcer bleed with hgb down to 6 in 2002. No aspirin A/P: with severity of prior bleed- likely keep long term PPI

## 2016-10-06 DIAGNOSIS — H02834 Dermatochalasis of left upper eyelid: Secondary | ICD-10-CM | POA: Diagnosis not present

## 2016-10-06 DIAGNOSIS — H02401 Unspecified ptosis of right eyelid: Secondary | ICD-10-CM | POA: Diagnosis not present

## 2016-10-06 DIAGNOSIS — I10 Essential (primary) hypertension: Secondary | ICD-10-CM | POA: Diagnosis not present

## 2016-10-06 DIAGNOSIS — H2513 Age-related nuclear cataract, bilateral: Secondary | ICD-10-CM | POA: Diagnosis not present

## 2016-10-12 NOTE — Progress Notes (Signed)
Subjective:   Maria [name] is a 75 y.o. female who presents for Medicare Annual (Subsequent) preventive examination.  Cardiac Risk Factors include: advanced age (>6255men, 53>65 women);dyslipidemia;hypertension;obesity (BMI >30kg/m2)  The Patient was informed that the wellness visit is to identify future health risk and educate and initiate measures that can reduce risk for increased disease through the lifespan.    NO ROS; Medicare Wellness Visit (Working at [employer] part time PPD test; will set up as has to be read in 48 to 72 hours Decided she would go to the PHD to take it or call the office next week and schedule a nurse visit to complete. The office would not be open to check her result in 48 to 72 hours if taken today  Describes health as good, fair or great? Good " I do all I can to stay healthy"   Preventive Screening -Counseling & Management  Mammogram 09/2016- ok Dexa is not listed - states she has not had one Agrees to have dexa; prefers [facility] ; will print order and present to Dr. Durene CalHunter for signature  States [employer] does pay;                  Meds; taking Tenoretic;  Taking zocor 4 times a week now per Dr. 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 no Safety; community, wears sunscreen, safe place for firearms; Motor  vehicle accidents; no   Cardiac Risk Factors:  Advanced aged: >65 in women Hyperlipidemia: HDl 45 HTN: watch your sodium;  Family History; CHF;   Depression Screen PhQ 2: negative  Activities of Daily Living - See functional screen   Hearing Difficulty: 4000 hz both ears [facility]  [facility]  Ophthalmology Exam:  Just had it Monday Dr. Felicity PellegriniMurdock Nov 2nd;  Had copy of exam and scanned to chart   Cognitive testing; Ad8 score reviewed for issues:  Issues making decisions:  Less interest in hobbies / activities:  Repeats questions, stories (family complaining):  Trouble using ordinary gadgets (microwave, computer, phone):  Forgets the month or year:   Mismanaging finances:   Remembering appts:  Daily problems with thinking and/or memory: Ad8 score is=0    Ad8 score; 0 or less than 2  MMSE deferred or completed if AD8 + 2 issues  Advanced Directives has AD and LW Will bring a copy   List the name of Physicians or other Practitioners you currently use:   Immunization History  Administered Date(s) Administered  . Influenza Split 09/08/2011, 10/29/2012  . Influenza Whole 08/21/2008, 09/06/2010  . Influenza, High Dose Seasonal PF 09/29/2016  . Influenza,inj,Quad PF,36+ Mos 11/07/2013  . Pneumococcal Conjugate-13 11/07/2013  . Pneumococcal Polysaccharide-23 12/05/2005, 09/08/2011  . Td 12/05/2001  .  Tdap 10/29/2012  . Zoster 08/21/2008   Required Immunizations needed today  Screening test up to date or reviewed for plan of completion Health Maintenance Due  Topic Date Due  . DEXA SCAN  02/04/2005   Prefer [facility] as she has her mammograms there  The following information was reviewed  Allergies; Medications; Past Medical Hx; Problem list; Surgical hx; Family hx; Social Hx     Objective:     Vitals: BP 130/70   Pulse 73   Ht 5\' 1"  (1.549 m)   Wt 156 lb 9 oz (71 kg)   SpO2 96%   BMI 29.58 kg/m   Body mass index is 29.58  kg/m.   Tobacco History  Smoking Status  . Never Smoker  Smokeless Tobacco  . Never Used     Counseling given: Yes   Past Medical History:  Diagnosis Date  . Arthritis   . Bronchitis    hx of  . GERD (gastroesophageal reflux disease)   . History of bleeding ulcers   . History of gastrointestinal hemorrhage    now off aspirin  . Hyperlipidemia   . Hypertension   . Osteoarthritis of right knee 06/03/2012   Past Surgical History:  Procedure Laterality Date  . ABDOMINAL HYSTERECTOMY     nonmalignant reasons  . APPENDECTOMY    . 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 OSTEONICS  . 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 Congestive Heart Failure Mother     74, only child  . Other Father     was not in patient life   History  Sexual Activity  . Sexual activity: Not on file    Outpatient Encounter Prescriptions as of 10/13/2016  Medication Sig  . atenolol-chlorthalidone (TENORETIC) 50-25 MG tablet Take 0.5 tablets by mouth daily.  . Calcium Carbonate-Vitamin D (CALCIUM PLUS VITAMIN D PO) Take 2.5 tablets by mouth daily.   Marland Kitchen omeprazole (PRILOSEC) 20 MG capsule Take 1 capsule (20 mg total) by mouth daily.  . potassium chloride SA (K-DUR,KLOR-CON) 20 MEQ tablet Take 1 tablet (20 mEq total) by mouth 2 (two) times daily.  . simvastatin (ZOCOR) 20 MG tablet take 1 tablet by mouth THREE TIMES A WEEK AT BEDTIME   No facility-administered encounter medications on file as of 10/13/2016.     Activities of Daily Living In your present state of health, do you have any difficulty performing the following activities: 10/13/2016   Hearing? N  Vision? Y  Difficulty concentrating or making decisions? N  Walking or climbing stairs? N  Dressing or bathing? N  Doing errands, shopping? N  Preparing Food and eating ? N  Using the Toilet? N  In the past six months, have you accidently leaked urine? N  Do you have problems with loss of bowel control? N  Managing your Medications? N  Managing your Finances? N  Housekeeping or managing your Housekeeping? N  Some recent data might be hidden    Patient Care Team: Shelva Majestic, MD as PCP - General (Family Medicine)    Assessment:    ASSESSMENT INCLUDED:   Review for health history including a functional  assessment, fall risk, depression screen, memory loss, vision and hearing screens; Was educated and referred as appropriate. See Plan   Psychosocial risk reviewed as stress; unresolved grief; pain; lack of support; lack of income to buy groceries, meds etc.  Behavioral risk addressed such as tobacco, ETOH; diet (metabolic syndrome) and exercise  Risk for independent living or long term plan   Risk for safety; Bathroom; community; firearms, sun protection; auto accidents   All immunizations and overdue screens were reviewed for a plan or follow-up.   Labs were reviewed in regard to Lipids and A1c if appropriate.   Discussed Recommended screenings and documented any personalized health advice and referrals for preventive counseling.  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 Weight (lb) < 145 lb (65.8 kg)          Works 3 days with children;  Line dancing one day Pick up 2 days of walking Replace one meal with  shake  Fat free or low fat dairy products Fish high in omega-3 acids ( salmon, tuna, trout) Fruits, such as apples, bananas, oranges, pears, prunes Legumes, such as kidney beans, lentils, checkpeas, black-eyed peas and lima beans Vegetables; broccoli, cabbage, carrots Whole grains;   Plant fats are better; decrease "white" foods as pasta, rice, bread and desserts, sugar; Avoid red meat (limiting) palm and coconut oils; sugary foods and beverages  Two nutrients that raise blood chol levels are saturated fats and trans fat; in hydrogenated oils and fats, as stick margarine, baked goods (cookes, cakes, pies, crackers; frosting; and coffee creamers;   Some Fats lower cholesterol: Monounsaturated and polyunsaturated  Avocados Corn, sunflower, and soybean oils Nuts and seeds, such as walnuts Olive, canola, peanut, safflower, and sesame oils Peanut butter Salmon and trout Tofu         Fall Risk Fall Risk  10/13/2016 03/29/2016 02/27/2014  Falls in the past year? Yes No No  Number falls in past yr: 1 - -  Follow up Education provided - -   Depression Screen PHQ 2/9 Scores 10/13/2016 03/29/2016 02/27/2014  PHQ - 2 Score 0 0 0     Cognitive Function MMSE - Mini Mental State Exam 10/13/2016  Not completed: (No Data)    Ad8 score is 0    Immunization History  Administered Date(s) Administered  . Influenza Split 09/08/2011, 10/29/2012  . Influenza Whole 08/21/2008, 09/06/2010  . Influenza, High Dose Seasonal PF 09/29/2016  . Influenza,inj,Quad PF,36+ Mos 11/07/2013  . Pneumococcal Conjugate-13 11/07/2013  . Pneumococcal Polysaccharide-23 12/05/2005, 09/08/2011  . Td 12/05/2001  . Tdap 10/29/2012  . Zoster 08/21/2008   Screening Tests Health Maintenance  Topic Date Due  . DEXA SCAN  02/04/2005  . MAMMOGRAM  09/05/2017  . TETANUS/TDAP  10/29/2022  . INFLUENZA VACCINE  Completed  . ZOSTAVAX  Completed  . PNA vac Low Risk Adult  Completed      Plan:  1. 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 Required by employer Will go to the PHD or will call early next week for a nurse visit so that it can be checked in 48 hours.  2. States she has not had a dexa Agreed to a dexa but prefers  [facility]  Will order and fax p Dr. Durene Cal signs  3. Will fup on colonoscopy; not sure if she needs another as she thought it was due in 2018;  Call to Dr. Anselm Jungling office and confirmed due in 04/2017;  Will let Ms. Scantlin know.   4. Agrees to weight loss;   During the course of the visit the patient was educated and counseled about the following appropriate screening and preventive services:   Vaccines to include Pneumoccal, Influenza, Hepatitis B, Td, Zostavax, HCV  Electrocardiogram  Cardiovascular Disease  Colorectal cancer screening/ fup as requested; due 04/2017  Bone density screening/ ordered as requested   Diabetes screening  Glaucoma screening / just had eye exam Nov 2nd;   Mammography/completed this year  Nutrition counseling - agreed to try and lose a few pounds  Patient Instructions (the written plan) was given to the patient.   Montine Circle, RN  10/13/2016

## 2016-10-13 ENCOUNTER — Ambulatory Visit (INDEPENDENT_AMBULATORY_CARE_PROVIDER_SITE_OTHER): Payer: Medicare Other

## 2016-10-13 VITALS — BP 130/70 | HR 73 | Ht 61.0 in | Wt 156.6 lb

## 2016-10-13 DIAGNOSIS — Z0000 Encounter for general adult medical examination without abnormal findings: Secondary | ICD-10-CM | POA: Diagnosis not present

## 2016-10-13 DIAGNOSIS — Z780 Asymptomatic menopausal state: Secondary | ICD-10-CM | POA: Diagnosis not present

## 2016-10-13 NOTE — Patient Instructions (Addendum)
Maria [name] , Thank you for taking time to come for your Medicare Wellness Visit. I appreciate your ongoing commitment to your health goals. Please review the following plan we discussed and let me know if I can assist you in the future.   Will try to bring copy of advanced directive and Living will for the chart  Prevention of falls: Remove rugs or any tripping hazards in the home Use Non slip mats in bathtubs and showers Placing grab bars next to the toilet and or shower Placing handrails on both sides of the stair way Adding extra lighting in the home.   Personal safety issues reviewed:  1. Consider starting a community watch program per [facility] 2.  Changes batteries is smoke detector and/or carbon monoxide detector  3.  If you have firearms; keep them in a safe place 4.  Wear protection when in the sun; Always wear sunscreen or a hat; It is good to have your doctor check your skin annually or review any new areas of concern 5. Driving safety; Keep in the right lane; stay 3 car lengths behind the car in front of you on the highway; look 3 times prior to pulling out; carry your cell phone everywhere you go!   Learn about the Yellow Dot program:  The program allows first responders at your emergency to have access to who your physician is, as well as your medications and medical conditions.  Citizens requesting the Yellow Dot Packages should contact Master Corporal Nunzio Cobbs at the [facility] [phone] for the first week of the program and beginning the week after Easter citizens should contact their Scientist, physiological.  To order Dexa scan at [facility]   These are the goals we discussed: Goals    . Weight (lb) < 145 lb (65.8 kg)          Works 3 days with children;  Line dancing one day Pick up 2 days of walking Replace one meal with shake  Fat free or low fat dairy products Fish high in omega-3 acids ( salmon, tuna,  trout) Fruits, such as apples, bananas, oranges, pears, prunes Legumes, such as kidney beans, lentils, checkpeas, black-eyed peas and lima beans Vegetables; broccoli, cabbage, carrots Whole grains;   Plant fats are better; decrease "white" foods as pasta, rice, bread and desserts, sugar; Avoid red meat (limiting) palm and coconut oils; sugary foods and beverages  Two nutrients that raise blood chol levels are saturated fats and trans fat; in hydrogenated oils and fats, as stick margarine, baked goods (cookes, cakes, pies, crackers; frosting; and coffee creamers;   Some Fats lower cholesterol: Monounsaturated and polyunsaturated  Avocados Corn, sunflower, and soybean oils Nuts and seeds, such as walnuts Olive, canola, peanut, safflower, and sesame oils Peanut butter Salmon and trout Tofu          This is a list of the screening recommended for you and due dates:  Health Maintenance  Topic Date Due  . DEXA scan (bone density measurement)  02/04/2005  . Mammogram  09/05/2017  . Tetanus Vaccine  10/29/2022  . Flu Shot  Completed  . Shingles Vaccine  Completed  . Pneumonia vaccines  Completed     [employer]; [phone] Sr. [place] [employer]; [phone]  "community housing solutions"  Get resource to get information on any and all community programs for [employer]: 212-055-0424 Community Health Response Program -149-702-6378 Public Health Dept; Need to be a skilled visit but can  assist with bathing as well; [phone]   [employer] general resources for food etc          Fall Prevention in the Home  Falls can cause injuries. They can happen to people of all ages. There are many things you can do to make your home safe and to help prevent falls.  WHAT CAN I DO ON THE OUTSIDE OF MY HOME?  Regularly fix the edges of walkways and driveways and fix any cracks.  Remove anything that might make you trip as you walk through a door, such as a  raised step or threshold.  Trim any bushes or trees on the path to your home.  Use bright outdoor lighting.  Clear any walking paths of anything that might make someone trip, such as rocks or tools.  Regularly check to see if handrails are loose or broken. Make sure that both sides of any steps have handrails.  Any raised decks and porches should have guardrails on the edges.  Have any leaves, snow, or ice cleared regularly.  Use sand or salt on walking paths during winter.  Clean up any spills in your garage right away. This includes oil or grease spills. WHAT CAN I DO IN THE BATHROOM?   Use night lights.  Install grab bars by the toilet and in the tub and shower. Do not use towel bars as grab bars.  Use non-skid mats or decals in the tub or shower.  If you need to sit down in the shower, use a plastic, non-slip stool.  Keep the floor dry. Clean up any water that spills on the floor as soon as it happens.  Remove soap buildup in the tub or shower regularly.  Attach bath mats securely with double-sided non-slip rug tape.  Do not have throw rugs and other things on the floor that can make you trip. WHAT CAN I DO IN THE BEDROOM?  Use night lights.  Make sure that you have a light by your bed that is easy to reach.  Do not use any sheets or blankets that are too big for your bed. They should not hang down onto the floor.  Have a firm chair that has side arms. You can use this for support while you get dressed.  Do not have throw rugs and other things on the floor that can make you trip. WHAT CAN I DO IN THE KITCHEN?  Clean up any spills right away.  Avoid walking on wet floors.  Keep items that you use a lot in easy-to-reach places.  If you need to reach something above you, use a strong step stool that has a grab bar.  Keep electrical cords out of the way.  Do not use floor polish or wax that makes floors slippery. If you must use wax, use non-skid floor  wax.  Do not have throw rugs and other things on the floor that can make you trip. WHAT CAN I DO WITH MY STAIRS?  Do not leave any items on the stairs.  Make sure that there are handrails on both sides of the stairs and use them. Fix handrails that are broken or loose. Make sure that handrails are as long as the stairways.  Check any carpeting to make sure that it is firmly attached to the stairs. Fix any carpet that is loose or worn.  Avoid having throw rugs at the top or bottom of the stairs. If you do have throw rugs, attach them to the floor  with carpet tape.  Make sure that you have a light switch at the top of the stairs and the bottom of the stairs. If you do not have them, ask someone to add them for you. WHAT ELSE CAN I DO TO HELP PREVENT FALLS?  Wear shoes that:  Do not have high heels.  Have rubber bottoms.  Are comfortable and fit you well.  Are closed at the toe. Do not wear sandals.  If you use a stepladder:  Make sure that it is fully opened. Do not climb a closed stepladder.  Make sure that both sides of the stepladder are locked into place.  Ask someone to hold it for you, if possible.  Clearly mark and make sure that you can see:  Any grab bars or handrails.  First and last steps.  Where the edge of each step is.  Use tools that help you move around (mobility aids) if they are needed. These include:  Canes.  Walkers.  Scooters.  Crutches.  Turn on the lights when you go into a dark area. Replace any light bulbs as soon as they burn out.  Set up your furniture so you have a clear path. Avoid moving your furniture around.  If any of your floors are uneven, fix them.  If there are any pets around you, be aware of where they are.  Review your medicines with your doctor. Some medicines can make you feel dizzy. This can increase your chance of falling. Ask your doctor what other things that you can do to help prevent falls.   This information  is not intended to replace advice given to you by your health care provider. Make sure you discuss any questions you have with your health care provider.   Document Released: 09/17/2009 Document Revised: 04/07/2015 Document Reviewed: 12/26/2014 Elsevier Interactive Patient Education 2016 [place] Maintenance, Female Adopting a healthy lifestyle and getting preventive care can go a long way to promote health and wellness. Talk with your health care provider about what schedule of regular examinations is right for you. This is a good chance for you to check in with your provider about disease prevention and staying healthy. In between checkups, there are plenty of things you can do on your own. Experts have done a lot of research about which lifestyle changes and preventive measures are most likely to keep you healthy. Ask your health care provider for more information. WEIGHT AND DIET  Eat a healthy diet  Be sure to include plenty of vegetables, fruits, low-fat dairy products, and lean protein.  Do not eat a lot of foods high in solid fats, added sugars, or salt.  Get regular exercise. This is one of the most important things you can do for your health.  Most adults should exercise for at least 150 minutes each week. The exercise should increase your heart rate and make you sweat (moderate-intensity exercise).  Most adults should also do strengthening exercises at least twice a week. This is in addition to the moderate-intensity exercise.  Maintain a healthy weight  Body mass index (BMI) is a measurement that can be used to identify possible weight problems. It estimates body fat based on height and weight. Your health care provider can help determine your BMI and help you achieve or maintain a healthy weight.  For females 56 years of age and older:   A BMI below 18.5 is considered underweight.  A BMI of 18.5 to 24.9 is normal.  A BMI of 25 to 29.9 is considered  overweight.  A BMI of 30 and above is considered obese.  Watch levels of cholesterol and blood lipids  You should start having your blood tested for lipids and cholesterol at 76 years of age, then have this test every 5 years.  You may need to have your cholesterol levels checked more often if:  Your lipid or cholesterol levels are high.  You are older than 76 years of age.  You are at high risk for heart disease.  CANCER SCREENING   Lung Cancer  Lung cancer screening is recommended for adults 67-69 years old who are at high risk for lung cancer because of a history of smoking.  A yearly low-dose CT scan of the lungs is recommended for people who:  Currently smoke.  Have quit within the past 15 years.  Have at least a 30-pack-year history of smoking. A pack year is smoking an average of one pack of cigarettes a day for 1 year.  Yearly screening should continue until it has been 15 years since you quit.  Yearly screening should stop if you develop a health problem that would prevent you from having lung cancer treatment.  Breast Cancer  Practice breast self-awareness. This means understanding how your breasts normally appear and feel.  It also means doing regular breast self-exams. Let your health care provider know about any changes, no matter how small.  If you are in your 20s or 30s, you should have a clinical breast exam (CBE) by a health care provider every 1-3 years as part of a regular health exam.  If you are 29 or older, have a CBE every year. Also consider having a breast X-ray (mammogram) every year.  If you have a family history of breast cancer, talk to your health care provider about genetic screening.  If you are at high risk for breast cancer, talk to your health care provider about having an MRI and a mammogram every year.  Breast cancer gene (BRCA) assessment is recommended for women who have family members with BRCA-related cancers. BRCA-related  cancers include:  Breast.  Ovarian.  Tubal.  Peritoneal cancers.  Results of the assessment will determine the need for genetic counseling and BRCA1 and BRCA2 testing. Cervical Cancer Your health care provider may recommend that you be screened regularly for cancer of the pelvic organs (ovaries, uterus, and vagina). This screening involves a pelvic examination, including checking for microscopic changes to the surface of your cervix (Pap test). You may be encouraged to have this screening done every 3 years, beginning at age 92.  For women ages 61-65, health care providers may recommend pelvic exams and Pap testing every 3 years, or they may recommend the Pap and pelvic exam, combined with testing for human papilloma virus (HPV), every 5 years. Some types of HPV increase your risk of cervical cancer. Testing for HPV may also be done on women of any age with unclear Pap test results.  Other health care providers may not recommend any screening for nonpregnant women who are considered low risk for pelvic cancer and who do not have symptoms. Ask your health care provider if a screening pelvic exam is right for you.  If you have had past treatment for cervical cancer or a condition that could lead to cancer, you need Pap tests and screening for cancer for at least 20 years after your treatment. If Pap tests have been discontinued, your risk factors (such as having  a new sexual partner) need to be reassessed to determine if screening should resume. Some women have medical problems that increase the chance of getting cervical cancer. In these cases, your health care provider may recommend more frequent screening and Pap tests. Colorectal Cancer  This type of cancer can be detected and often prevented.  Routine colorectal cancer screening usually begins at 76 years of age and continues through 76 years of age.  Your health care provider may recommend screening at an earlier age if you have risk  factors for colon cancer.  Your health care provider may also recommend using home test kits to check for hidden blood in the stool.  A small camera at the end of a tube can be used to examine your colon directly (sigmoidoscopy or colonoscopy). This is done to check for the earliest forms of colorectal cancer.  Routine screening usually begins at age 85.  Direct examination of the colon should be repeated every 5-10 years through 76 years of age. However, you may need to be screened more often if early forms of precancerous polyps or small growths are found. Skin Cancer  Check your skin from head to toe regularly.  Tell your health care provider about any new moles or changes in moles, especially if there is a change in a mole's shape or color.  Also tell your health care provider if you have a mole that is larger than the size of a pencil eraser.  Always use sunscreen. Apply sunscreen liberally and repeatedly throughout the day.  Protect yourself by wearing long sleeves, pants, a wide-brimmed hat, and sunglasses whenever you are outside. HEART DISEASE, DIABETES, AND HIGH BLOOD PRESSURE   High blood pressure causes heart disease and increases the risk of stroke. High blood pressure is more likely to develop in:  People who have blood pressure in the high end of the normal range (130-139/85-89 mm Hg).  People who are overweight or obese.  People who are African American.  If you are 23-51 years of age, have your blood pressure checked every 3-5 years. If you are 51 years of age or older, have your blood pressure checked every year. You should have your blood pressure measured twice--once when you are at a hospital or clinic, and once when you are not at a hospital or clinic. Record the average of the two measurements. To check your blood pressure when you are not at a hospital or clinic, you can use:  An automated blood pressure machine at a pharmacy.  A home blood pressure  monitor.  If you are between 51 years and 32 years old, ask your health care provider if you should take aspirin to prevent strokes.  Have regular diabetes screenings. This involves taking a blood sample to check your fasting blood sugar level.  If you are at a normal weight and have a low risk for diabetes, have this test once every three years after 76 years of age.  If you are overweight and have a high risk for diabetes, consider being tested at a younger age or more often. PREVENTING INFECTION  Hepatitis B  If you have a higher risk for hepatitis B, you should be screened for this virus. You are considered at high risk for hepatitis B if:  You were born in a country where hepatitis B is common. Ask your health care provider which countries are considered high risk.  Your parents were born in a high-risk country, and you have not been  immunized against hepatitis B (hepatitis B vaccine).  You have HIV or AIDS.  You use needles to inject street drugs.  You live with someone who has hepatitis B.  You have had sex with someone who has hepatitis B.  You get hemodialysis treatment.  You take certain medicines for conditions, including cancer, organ transplantation, and autoimmune conditions. Hepatitis C  Blood testing is recommended for:  Everyone born from [birth year] through 1966.  Anyone with known risk factors for hepatitis C. Sexually transmitted infections (STIs)  You should be screened for sexually transmitted infections (STIs) including gonorrhea and chlamydia if:  You are sexually active and are younger than 76 years of age.  You are older than 76 years of age and your health care provider tells you that you are at risk for this type of infection.  Your sexual activity has changed since you were last screened and you are at an increased risk for chlamydia or gonorrhea. Ask your health care provider if you are at risk.  If you do not have HIV, but are at risk, it may be  recommended that you take a prescription medicine daily to prevent HIV infection. This is called pre-exposure prophylaxis (PrEP). You are considered at risk if:  You are sexually active and do not regularly use condoms or know the HIV status of your partner(s).  You take drugs by injection.  You are sexually active with a partner who has HIV. Talk with your health care provider about whether you are at high risk of being infected with HIV. If you choose to begin PrEP, you should first be tested for HIV. You should then be tested every 3 months for as long as you are taking PrEP.  PREGNANCY   If you are premenopausal and you may become pregnant, ask your health care provider about preconception counseling.  If you may become pregnant, take 400 to 800 micrograms (mcg) of folic acid every day.  If you want to prevent pregnancy, talk to your health care provider about birth control (contraception). OSTEOPOROSIS AND MENOPAUSE   Osteoporosis is a disease in which the bones lose minerals and strength with aging. This can result in serious bone fractures. Your risk for osteoporosis can be identified using a bone density scan.  If you are 39 years of age or older, or if you are at risk for osteoporosis and fractures, ask your health care provider if you should be screened.  Ask your health care provider whether you should take a calcium or vitamin D supplement to lower your risk for osteoporosis.  Menopause may have certain physical symptoms and risks.  Hormone replacement therapy may reduce some of these symptoms and risks. Talk to your health care provider about whether hormone replacement therapy is right for you.  HOME CARE INSTRUCTIONS   Schedule regular health, dental, and eye exams.  Stay current with your immunizations.   Do not use any tobacco products including cigarettes, chewing tobacco, or electronic cigarettes.  If you are pregnant, do not drink alcohol.  If you are  breastfeeding, limit how much and how often you drink alcohol.  Limit alcohol intake to no more than 1 drink per day for nonpregnant women. One drink equals 12 ounces of beer, 5 ounces of wine, or 1 ounces of hard liquor.  Do not use street drugs.  Do not share needles.  Ask your health care provider for help if you need support or information about quitting drugs.  Tell your health care  provider if you often feel depressed.  Tell your health care provider if you have ever been abused or do not feel safe at home.   This information is not intended to replace advice given to you by your health care provider. Make sure you discuss any questions you have with your health care provider.   Document Released: 06/06/2011 Document Revised: 12/12/2014 Document Reviewed: 10/23/2013 Elsevier Interactive Patient Education [employer].

## 2016-10-13 NOTE — Progress Notes (Signed)
I have reviewed and agree with note, evaluation, plan. Thankful for dexa being set up. Weight loss would definitely be a good idea.   Tana Conch, MD

## 2016-11-03 DIAGNOSIS — M8589 Other specified disorders of bone density and structure, multiple sites: Secondary | ICD-10-CM | POA: Diagnosis not present

## 2016-11-03 DIAGNOSIS — Z780 Asymptomatic menopausal state: Secondary | ICD-10-CM | POA: Diagnosis not present

## 2016-11-03 LAB — HM DEXA SCAN

## 2016-11-08 ENCOUNTER — Encounter: Payer: Self-pay | Admitting: Family Medicine

## 2016-11-08 DIAGNOSIS — M8588 Other specified disorders of bone density and structure, other site: Secondary | ICD-10-CM | POA: Insufficient documentation

## 2017-01-13 DIAGNOSIS — M25551 Pain in right hip: Secondary | ICD-10-CM | POA: Diagnosis not present

## 2017-01-13 DIAGNOSIS — M545 Low back pain: Secondary | ICD-10-CM | POA: Diagnosis not present

## 2017-02-06 ENCOUNTER — Ambulatory Visit (INDEPENDENT_AMBULATORY_CARE_PROVIDER_SITE_OTHER): Payer: Medicare Other | Admitting: Family Medicine

## 2017-02-06 ENCOUNTER — Encounter: Payer: Self-pay | Admitting: Family Medicine

## 2017-02-06 VITALS — BP 132/70 | HR 87 | Temp 98.7°F | Ht 61.0 in | Wt 157.8 lb

## 2017-02-06 DIAGNOSIS — J069 Acute upper respiratory infection, unspecified: Secondary | ICD-10-CM

## 2017-02-06 MED ORDER — PREDNISONE 20 MG PO TABS: ORAL_TABLET | ORAL | 0 refills | Status: DC

## 2017-02-06 NOTE — Progress Notes (Signed)
PCP: Tana Conch, MD  Subjective:  Maria [name] is a 76 y.o. year old very pleasant female patient who presents with Upper Respiratory infection symptoms including nasal congestion, sore throat, cough, runny nose. Started with fever/chills first day or two but that completely resolved. Mainly now just lingering runny nose and cough. Had some sneezing heavily at first -started: 9 days ago, symptoms are improving slowly -previous treatments: mucinex trialed in last 2 days and found this helpful -sick contacts/travel/risks: denies flu exposure.  -Hx of: allergies  ROS-denies fever, SOB, NVD, tooth pain  Pertinent Past Medical History-  Patient Active Problem List   Diagnosis Date Noted  . GERD (gastroesophageal reflux disease) 05/27/2014    Priority: Medium  . Hyperlipidemia LDL goal <100 08/21/2008    Priority: Medium  . Essential hypertension 08/21/2008    Priority: Medium  . Osteoarthritis of right knee 06/03/2012    Priority: Low  . 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 History of gastrointestinal hemorrhage     Medications- reviewed  Current Outpatient Prescriptions  Medication Sig Dispense Refill  . atenolol-chlorthalidone (TENORETIC) 50-25 MG tablet Take 0.5 tablets by mouth daily. 45 tablet 1  . Calcium Carbonate-Vitamin D (CALCIUM PLUS VITAMIN D PO) Take 2.5 tablets by mouth daily.     Marland Kitchen omeprazole (PRILOSEC) 20 MG capsule Take 1 capsule (20 mg total) by mouth daily. 90 capsule 1  . potassium chloride SA (K-DUR,KLOR-CON) 20 MEQ tablet Take 1 tablet (20 mEq total) by mouth 2 (two) times daily. 180 tablet 1  . simvastatin (ZOCOR) 20 MG tablet take 1 tablet by mouth THREE TIMES A WEEK AT BEDTIME 100 tablet 1  . predniSONE (DELTASONE) 20 MG tablet Take 1 tablet by mouth daily for 5 days, then 1/2 tablet daily for 2 days 6 tablet 0   No current facility-administered medications for this visit.     Objective: BP 132/70 (BP Location: Left Arm, Patient Position:  Sitting, Cuff Size: Large)   Pulse 87   Temp 98.7 F (37.1 C) (Oral)   Ht 5\' 1"  (1.549 m)   Wt 157 lb 12.8 oz (71.6 kg)   SpO2 95%   BMI 29.82 kg/m  Gen: NAD, resting comfortably HEENT: Turbinates erythematous, TM normal on left, some cerumen on right, pharynx mildly erythematous with no tonsilar exudate or edema, no sinus tenderness CV: RRR no murmurs rubs or gallops Lungs: CTAB no crackles, wheeze, rhonchi Ext: no edema Skin: warm, dry, no rash  Assessment/Plan:  Upper Respiratory infection History and exam today are suggestive of viral infection most likely due to upper respiratory infection. Symptomatic treatment with: mucinex  We discussed that we did not find any infection that had higher probability of being bacterial such as pneumonia or strep throat. We discussed signs that bacterial infection may have developed particularly fever or shortness of breath.   Likely course of 2 weeks. Patient is contagious and advised good handwashing and consideration of mask If going to be in public places.  Given already 9 days of symptoms we discussed if not better within the next week (as long as no fever or shortness of breath) that she should take the prednisone that was printed for her today. This would be for post viral cough. Also wonder if allergic element with cough, runny nose, sneezing hx  Finally, we reviewed reasons to return to care including if symptoms worsen or persist or new concerns arise- once again particularly bshortness of breath or fever.  Meds ordered this encounter  Medications  . predniSONE (DELTASONE) 20 MG tablet    Sig: Take 1 tablet by mouth daily for 5 days, then 1/2 tablet daily for 2 days    Dispense:  6 tablet    Refill:  0    Tana Conch, MD

## 2017-02-06 NOTE — Progress Notes (Signed)
Pre visit review using our clinic review tool, if applicable. No additional management support is needed unless otherwise documented below in the visit note. 

## 2017-02-06 NOTE — Patient Instructions (Signed)
Upper Respiratory infection History and exam today are suggestive of viral infection most likely due to upper respiratory infection. Symptomatic treatment with: mucinex  We discussed that we did not find any infection that had higher probability of being bacterial such as pneumonia or strep throat. We discussed signs that bacterial infection may have developed particularly fever or shortness of breath.   Likely course of 2 weeks. Patient is contagious and advised good handwashing and consideration of mask If going to be in public places.  Given already 9 days of symptoms we discussed if not better within the next week (as long as no fever or shortness of breath) that she should take the prednisone that was printed for her today   Finally, we reviewed reasons to return to care including if symptoms worsen or persist or new concerns arise- once again particularly shortness of breath or fever.  Meds ordered this encounter  Medications  . predniSONE (DELTASONE) 20 MG tablet    Sig: Take 1 tablet by mouth daily for 5 days, then 1/2 tablet daily for 2 days    Dispense:  6 tablet    Refill:  0

## 2017-03-01 ENCOUNTER — Encounter: Payer: Self-pay | Admitting: Gastroenterology

## 2017-03-23 ENCOUNTER — Other Ambulatory Visit: Payer: Self-pay | Admitting: Family Medicine

## 2017-03-23 DIAGNOSIS — I10 Essential (primary) hypertension: Secondary | ICD-10-CM

## 2017-03-24 ENCOUNTER — Ambulatory Visit (INDEPENDENT_AMBULATORY_CARE_PROVIDER_SITE_OTHER): Payer: Medicare Other

## 2017-03-24 DIAGNOSIS — Z111 Encounter for screening for respiratory tuberculosis: Secondary | ICD-10-CM

## 2017-03-30 ENCOUNTER — Encounter: Payer: Self-pay | Admitting: Family Medicine

## 2017-03-30 ENCOUNTER — Other Ambulatory Visit: Payer: Self-pay

## 2017-03-30 ENCOUNTER — Ambulatory Visit (INDEPENDENT_AMBULATORY_CARE_PROVIDER_SITE_OTHER): Payer: Medicare Other | Admitting: Family Medicine

## 2017-03-30 VITALS — BP 164/84 | HR 78 | Temp 98.2°F | Ht 61.0 in | Wt 156.6 lb

## 2017-03-30 DIAGNOSIS — M8588 Other specified disorders of bone density and structure, other site: Secondary | ICD-10-CM | POA: Diagnosis not present

## 2017-03-30 DIAGNOSIS — I10 Essential (primary) hypertension: Secondary | ICD-10-CM | POA: Diagnosis not present

## 2017-03-30 DIAGNOSIS — K219 Gastro-esophageal reflux disease without esophagitis: Secondary | ICD-10-CM

## 2017-03-30 DIAGNOSIS — E785 Hyperlipidemia, unspecified: Secondary | ICD-10-CM

## 2017-03-30 DIAGNOSIS — E876 Hypokalemia: Secondary | ICD-10-CM

## 2017-03-30 DIAGNOSIS — Z0000 Encounter for general adult medical examination without abnormal findings: Secondary | ICD-10-CM

## 2017-03-30 LAB — LIPID PANEL
CHOL/HDL RATIO: 4
CHOLESTEROL: 187 mg/dL (ref 0–200)
HDL: 51.2 mg/dL (ref >39.00)
LDL CALC: 124 mg/dL — AB (ref 0–99)
NonHDL: 136.1
Triglycerides: 63 mg/dL (ref 0.0–149.0)
VLDL: 12.6 mg/dL (ref 0.0–40.0)

## 2017-03-30 LAB — COMPREHENSIVE METABOLIC PANEL
ALBUMIN: 3.8 g/dL (ref 3.5–5.2)
ALT: 9 U/L (ref 0–35)
AST: 13 U/L (ref 0–37)
Alkaline Phosphatase: 95 U/L (ref 39–117)
BUN: 12 mg/dL (ref 6–23)
CHLORIDE: 105 meq/L (ref 96–112)
CO2: 31 meq/L (ref 19–32)
Calcium: 9.9 mg/dL (ref 8.4–10.5)
Creatinine, Ser: 1.02 mg/dL (ref 0.40–1.20)
GFR: 67.55 mL/min (ref >60.00)
GLUCOSE: 85 mg/dL (ref 70–99)
POTASSIUM: 3.9 meq/L (ref 3.5–5.1)
SODIUM: 141 meq/L (ref 135–145)
Total Bilirubin: 0.6 mg/dL (ref 0.2–1.2)
Total Protein: 6.6 g/dL (ref 6.0–8.3)

## 2017-03-30 LAB — CBC
HEMATOCRIT: 37.8 % (ref 36.0–46.0)
Hemoglobin: 12.5 g/dL (ref 12.0–15.0)
MCHC: 33.1 g/dL (ref 30.0–36.0)
MCV: 83.9 fl (ref 78.0–100.0)
Platelets: 268 10*3/uL (ref 150.0–400.0)
RBC: 4.51 Mil/uL (ref 3.87–5.11)
RDW: 14.6 % (ref 11.5–15.5)
WBC: 5.4 10*3/uL (ref 4.0–10.5)

## 2017-03-30 MED ORDER — ZOSTER VAC RECOMB ADJUVANTED 50 MCG/0.5ML IM SUSR: 0.5000 mL | Freq: Once | INTRAMUSCULAR | 1 refills | Status: AC

## 2017-03-30 MED ORDER — POTASSIUM CHLORIDE CRYS ER 20 MEQ PO TBCR: 20.0000 meq | EXTENDED_RELEASE_TABLET | Freq: Two times a day (BID) | ORAL | 1 refills | Status: DC

## 2017-03-30 MED ORDER — OMEPRAZOLE 20 MG PO CPDR: 20.0000 mg | DELAYED_RELEASE_CAPSULE | Freq: Every day | ORAL | 1 refills | Status: DC

## 2017-03-30 NOTE — Progress Notes (Signed)
Phone: [phone]  Subjective:  Patient presents today for their annual physical. Chief complaint-noted.   See problem oriented charting- ROS- full  review of systems was completed and negative including No chest pain or shortness of breath. No headache or blurry vision.   The following were reviewed and entered/updated in epic: Past Medical History:  Diagnosis Date  . Arthritis   . Bronchitis    hx of  . GERD (gastroesophageal reflux disease)   . History of bleeding ulcers   . History of gastrointestinal hemorrhage    now off aspirin  . Hyperlipidemia   . Hypertension   . Osteoarthritis of right knee 06/03/2012   Patient Active Problem List   Diagnosis Date Noted  . GERD (gastroesophageal reflux disease) 05/27/2014    Priority: Medium  . Hyperlipidemia LDL goal <100 08/21/2008    Priority: Medium  . Essential hypertension 08/21/2008    Priority: Medium  . Osteoarthritis of right knee 06/03/2012    Priority: Low  . 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 History of gastrointestinal hemorrhage    Past Surgical History:  Procedure Laterality Date  . ABDOMINAL HYSTERECTOMY     nonmalignant reasons  . APPENDECTOMY    . 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 OSTEONICS  . 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 Congestive Heart Failure Mother     74, only child  . Other Father     was not in patient life    Medications- reviewed and updated Current Outpatient Prescriptions  Medication Sig Dispense Refill  . atenolol-chlorthalidone (TENORETIC) 50-25 MG tablet take 1/2 tablet by mouth once daily 45 tablet 1  .  Calcium Carbonate-Vitamin D (CALCIUM PLUS VITAMIN D PO) Take 2.5 tablets by mouth daily.     . simvastatin (ZOCOR) 20 MG tablet take 1 tablet by mouth THREE TIMES A WEEK AT BEDTIME 100 tablet 1  . omeprazole (PRILOSEC) 20 MG capsule Take 1 capsule (20 mg total) by mouth daily. 90 capsule 1  . potassium chloride SA (K-DUR,KLOR-CON) 20 MEQ tablet Take 1 tablet (20 mEq total) by mouth 2 (two) times daily. 180 tablet 1  . Zoster Vac Recomb Adjuvanted [facility]) injection Inject 0.5 mLs into the muscle once. Repeat 2-3 months. 0.5 mL 1   No current facility-administered medications for this visit.     Allergies-reviewed and updated Allergies  Allergen Reactions  . Coumadin [Warfarin Sodium]     Ulcers  . Methocarbamol     Hallucinations  . Norco [Hydrocodone-Acetaminophen]     Hallucinations    Social History   Social History  . Marital status: Divorced    Spouse name: N/A  . Number of children: N/A  . Years of education: N/A   Social History Main Topics  . Smoking status: Never Smoker  . Smokeless tobacco: Never Used  . Alcohol use No  . Drug use: No  . Sexual activity: Not Asked   Other Topics Concern  . None   Social History Narrative   Lives with graddaughter and 7 year old son (was living alone before)- she is on habitat list   Divorced 1977.  Still doing childcare   Retired from [employer]    Objective: BP (!) 164/84 (BP Location: Left Arm, Patient Position: Sitting, Cuff Size: Normal)   Pulse 78   Temp 98.2 F (36.8 C) (Oral)   Ht 5\' 1"  (1.549 m)   Wt 156 lb 9.6 oz (71 kg)   SpO2 95%   BMI 29.59 kg/m  Gen: NAD, resting comfortably HEENT: Mucous membranes are moist. Oropharynx normal Neck: no thyromegaly CV: RRR no murmurs rubs or gallops Lungs: CTAB no crackles, wheeze, rhonchi Breasts: normal appearance, no masses or tenderness Abdomen: soft/nontender/nondistended/normal bowel sounds. No rebound or guarding.  Ext: no edema Skin: warm,  dry Neuro: grossly normal, moves all extremities, PERRLA  Assessment/Plan:  76 y.o. female presenting for annual physical.  Health Maintenance counseling: 1. Anticipatory guidance: Patient counseled regarding regular dental exams q6 months, eye exams yearly, wearing seatbelts.  2. Risk factor reduction:  Advised patient of need for regular exercise and diet rich and fruits and vegetables to reduce risk of heart attack and stroke. 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 Diet-reasonable but 5-10 lbs weight loss would be reasonable..  Wt Readings from Last 3 Encounters:  03/30/17 156 lb 9.6 oz (71 kg)  02/06/17 157 lb 12.8 oz (71.6 kg)  10/13/16 156 lb 9 oz (71 kg)  3. Immunizations/screenings/ancillary studies- shingrix at pharmacy. Repeat 2-3 months Immunization History  Administered Date(s) Administered  . Influenza Split 09/08/2011, 10/29/2012  . Influenza Whole 08/21/2008, 09/06/2010  . Influenza, High Dose Seasonal PF 09/29/2016  . Influenza,inj,Quad PF,36+ Mos 11/07/2013  . PPD Test 03/24/2017  . Pneumococcal Conjugate-13 11/07/2013  . Pneumococcal Polysaccharide-23 12/05/2005, 09/08/2011  . Td 12/05/2001  . Tdap 10/29/2012  . Zoster 08/21/2008  4. Cervical cancer screening- passed age based screening 5. Breast cancer screening-  breast exam today reassuring and mammogram 09/05/16 6. Colon cancer screening - received letter about scheduleing repeat from last in 2018. She will give them a call to schedule. Declines referral.   Status of chronic or acute concerns   GERD- controlled on prilosec. Needs to remain on with history life threatening bleed.   Hyperlipidemia LDL goal <100 update lipids. On simvastatin 20mg  4x a week. Would want LDL under 100 at least- was above this goal last year (was on 3x a week)  Essential hypertension poor initial control but did not take her atenolol- chlrthalidone 50-25mg  half  tablet today and usually does and controlled in past. Monitor only.  BP Readings from Last 3 Encounters:  03/30/17 (!) 164/84  02/06/17 132/70  10/13/16 130/70    Osteopenia of lumbar spine Osteopenia spine- 2017 dexa. Repeat 2020. Advised calcium/vitamin D - doing daily  watch kidneys- may have CKD III  Return in about 6 months (around 09/29/2017) for follow up- or sooner if needed.  Orders Placed This Encounter  Procedures  . CBC    [place]  . Comprehensive metabolic panel    [place]    Order Specific Question:   Has the patient fasted?    Answer:   No  . Lipid panel    [place]    Order Specific Question:   Has the patient fasted?    Answer:   No    Meds ordered this encounter  Medications  . Zoster Vac Recomb Adjuvanted [facility]) injection    Sig: Inject 0.5 mLs into the muscle once. Repeat 2-3 months.    Dispense:  0.5 mL  Refill:  1   Return precautions advised.  Tana Conch, MD

## 2017-03-30 NOTE — Progress Notes (Signed)
Pre visit review using our clinic review tool, if applicable. No additional management support is needed unless otherwise documented below in the visit note. 

## 2017-03-30 NOTE — Assessment & Plan Note (Signed)
Osteopenia spine- 2017 dexa. Repeat 2020. Advised calcium/vitamin D - doing daily

## 2017-03-30 NOTE — Assessment & Plan Note (Signed)
update lipids. On simvastatin 20mg  4x a week. Would want LDL under 100 at least- was above this goal last year (was on 3x a week)

## 2017-03-30 NOTE — Assessment & Plan Note (Signed)
poor initial control but did not take her atenolol- chlrthalidone 50-25mg  half tablet today and usually does and controlled in past. Monitor only.  BP Readings from Last 3 Encounters:  03/30/17 (!) 164/84  02/06/17 132/70  10/13/16 130/70

## 2017-03-30 NOTE — Patient Instructions (Addendum)
shingrix at pharmacy. Repeat 2-3 months  Take your blood pressure medicine when you get home but has looked great up to this point  Please stop by lab before you go

## 2017-03-31 ENCOUNTER — Other Ambulatory Visit: Payer: Self-pay

## 2017-03-31 MED ORDER — SIMVASTATIN 20 MG PO TABS: ORAL_TABLET | ORAL | 3 refills | Status: DC

## 2017-04-11 ENCOUNTER — Ambulatory Visit (INDEPENDENT_AMBULATORY_CARE_PROVIDER_SITE_OTHER): Payer: Medicare Other | Admitting: Family Medicine

## 2017-04-11 ENCOUNTER — Encounter: Payer: Self-pay | Admitting: Family Medicine

## 2017-04-11 VITALS — BP 130/74 | HR 78 | Temp 98.3°F | Ht 61.0 in | Wt 151.8 lb

## 2017-04-11 DIAGNOSIS — J301 Allergic rhinitis due to pollen: Secondary | ICD-10-CM | POA: Diagnosis not present

## 2017-04-11 DIAGNOSIS — A084 Viral intestinal infection, unspecified: Secondary | ICD-10-CM | POA: Diagnosis not present

## 2017-04-11 DIAGNOSIS — I10 Essential (primary) hypertension: Secondary | ICD-10-CM

## 2017-04-11 MED ORDER — FLUTICASONE PROPIONATE 50 MCG/ACT NA SUSP: 2.0000 | Freq: Every day | NASAL | 1 refills | Status: DC

## 2017-04-11 NOTE — Progress Notes (Signed)
Subjective:  Maria [name] is a 76 y.o. year old very pleasant female patient who presents for/with See problem oriented charting ROS- no fever or chills. No nausea or vomiting. No blood or mucus in stools.    Past Medical History-  Patient Active Problem List   Diagnosis Date Noted  . GERD (gastroesophageal reflux disease) 05/27/2014    Priority: Medium  . Hyperlipidemia LDL goal <100 08/21/2008    Priority: Medium  . Essential hypertension 08/21/2008    Priority: Medium  . Osteoarthritis of right knee 06/03/2012    Priority: Low  . 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 History of gastrointestinal hemorrhage     Medications- reviewed and updated Current Outpatient Prescriptions  Medication Sig Dispense Refill  . atenolol-chlorthalidone (TENORETIC) 50-25 MG tablet take 1/2 tablet by mouth once daily 45 tablet 1  . Calcium Carbonate-Vitamin D (CALCIUM PLUS VITAMIN D PO) Take 2.5 tablets by mouth daily.     Marland Kitchen omeprazole (PRILOSEC) 20 MG capsule Take 1 capsule (20 mg total) by mouth daily. 90 capsule 1  . potassium chloride SA (K-DUR,KLOR-CON) 20 MEQ tablet Take 1 tablet (20 mEq total) by mouth 2 (two) times daily. 180 tablet 1  . simvastatin (ZOCOR) 20 MG tablet Take 1 tablet by mouth everyday 90 tablet 3   No current facility-administered medications for this visit.     Objective: BP 130/74 (BP Location: Left Arm, Patient Position: Sitting, Cuff Size: Normal)   Pulse 78   Temp 98.3 F (36.8 C) (Oral)   Ht 5\' 1"  (1.549 m)   Wt 151 lb 12.8 oz (68.9 kg)   SpO2 98%   BMI 28.68 kg/m  Gen: NAD, resting comfortably Some cobblestoning, clear drainage in nares. Mucous membranes are moist. CV: RRR no murmurs rubs or gallops Lungs: CTAB no crackles, wheeze, rhonchi Abdomen: soft/nontender/nondistended/normal bowel sounds. No rebound or guarding.  Ext: no edema Skin: warm, dry  Assessment/Plan:  Viral gastroenteritis  Essential hypertension S: went to [place] [employer]  on Thursday, came back on Friday night and felt fine.   Patient for four days has felt weak. She has had loose stools and a cough during this time. Had at least 10 bowel movements Saturday, Sunday, Monday. Last diarrhea was yesterday evening. Today first normal bowel movement and no diarrhea. No nausea or vomiting. No blood in stool. No mucus in stool.   Does not know if anyone else on trip got sick. Drinking ginger ale and water and has been able to keep fluids down. Also has mild cough and drip down back of the throat. Some fatigue noted but improving A/P: she mainly wanted to make sure not dehydrated from the GI illness she had. Looks like she has done a great job. Considered bmet but given resolution of symptoms today and how well appearing she is opted against this  Also allergic rhinitis S: continued cough with postnasal drip/drainage over last month A/P: will trial flonase once daily for next month. Can stop after that if she would want. Really tough allergy season  Essential hypertension BP noted much improved today on her Atenolol-chlorthalidone 50-25mg  daily 1/2 tablet (had missed dose last visit) BP Readings from Last 3 Encounters:  04/11/17 130/74  03/30/17 (!) 164/84  02/06/17 132/70   Meds ordered this encounter  Medications  . fluticasone (FLONASE) 50 MCG/ACT nasal spray    Sig: Place 2 sprays into both nostrils daily.    Dispense:  16 g    Refill:  1  Return precautions advised.  Garret Reddish, MD

## 2017-04-11 NOTE — Assessment & Plan Note (Signed)
BP noted much improved today on her Atenolol-chlorthalidone 50-25mg  daily 1/2 tablet (had missed dose last visit) BP Readings from Last 3 Encounters:  04/11/17 130/74  03/30/17 (!) 164/84  02/06/17 132/70

## 2017-04-11 NOTE — Patient Instructions (Signed)
GREAT job staying well hydrated through this tough stomach bug. Thrilled you are doing better.   Trial flonase once a day to see if we can reduce the dripping down back of throat that is causing your cough. You can stop medicine after 1 month and see how you do without it.   Blood pressure looks much better- good news  See [place] back for new or worsening/recurrent symptoms or if you feel you annot keep fluids down

## 2017-05-02 ENCOUNTER — Telehealth: Payer: Self-pay | Admitting: Family Medicine

## 2017-05-02 NOTE — Telephone Encounter (Signed)
LMTCB

## 2017-05-02 NOTE — Telephone Encounter (Signed)
Spoke with pt and she reports recent increase in nasal congestion and slightly productive cough. She reports all mucus is clear. She denies any sinus p/p or f/n/v. She has not taken any OTC medications. Advised pt to try Mucinex DM or Coricidin HBP along with proper instruction on Flonase use. She will try this and call office if not improving. Nothing further needed at this time.

## 2017-05-02 NOTE — Telephone Encounter (Signed)
Patient Name: Maria [name] DOB: May 08, 1941 Initial Comment caller states her doctor gave her nose pray sluticasone 60mg  for nasal drip . She was told to do two squirts in each nostril once a day . Now she is sneezing , nasal drainage , coughing phlegm, an having pain in chest from coughing( no pain at the moment ) , wheezing last night but not anymore . She wants to know what Dr. Durene Cal wants her to do . no longer taking nasal spray an wants to know what she should do . Nurse Assessment Nurse: Yetta Barre, RN, Miranda Date/Time (Eastern Time): 05/02/2017 10:46:58 AM Confirm and document reason for call. If symptomatic, describe symptoms. ---Caller states her MD prescribed nose drops for post nasal drip. She has been using it in the morning for 1 week but stopped taking it Friday. She started coughing last Thursday. Last night she had a severe coughing spelling and was wheezing. Does the patient have any new or worsening symptoms? ---Yes Will a triage be completed? ---Yes Related visit to physician within the last 2 weeks? ---Yes Does the PT have any chronic conditions? (i.e. diabetes, asthma, etc.) ---Yes List chronic conditions. ---Allergies, HTN, High Cholesterol, Hx of bleed ulcer Is this a behavioral health or substance abuse call? ---No Guidelines Guideline Title Affirmed Question Affirmed Notes Cough - Acute Productive Cough with cold symptoms (e.g., runny nose, postnasal drip, throat clearing) Final Disposition User Home Care Yetta Barre, RN, Miranda Comments Caller was concerned her symptoms were related to the nose spray. Told caller that the symptoms do not sound related to the nose spray. Strongly encouraged caller to restarted the nose spray.

## 2017-09-07 DIAGNOSIS — Z1231 Encounter for screening mammogram for malignant neoplasm of breast: Secondary | ICD-10-CM | POA: Diagnosis not present

## 2017-09-07 LAB — HM MAMMOGRAPHY

## 2017-09-12 ENCOUNTER — Encounter: Payer: Self-pay | Admitting: Family Medicine

## 2017-10-11 ENCOUNTER — Other Ambulatory Visit: Payer: Self-pay | Admitting: Family Medicine

## 2017-10-11 DIAGNOSIS — K219 Gastro-esophageal reflux disease without esophagitis: Secondary | ICD-10-CM

## 2017-10-11 DIAGNOSIS — I10 Essential (primary) hypertension: Secondary | ICD-10-CM

## 2017-10-11 MED ORDER — OMEPRAZOLE 20 MG PO CPDR: 20.0000 mg | DELAYED_RELEASE_CAPSULE | Freq: Every day | ORAL | 1 refills | Status: DC

## 2017-10-11 MED ORDER — FLUTICASONE PROPIONATE 50 MCG/ACT NA SUSP: 2.0000 | Freq: Every day | NASAL | 2 refills | Status: DC

## 2017-10-11 MED ORDER — ATENOLOL-CHLORTHALIDONE 50-25 MG PO TABS: 0.5000 | ORAL_TABLET | Freq: Every day | ORAL | 1 refills | Status: DC

## 2017-10-11 NOTE — Telephone Encounter (Signed)
Pt notified Rx's were sent to pharmacy as requested. Pt verbalized understanding. 

## 2017-10-11 NOTE — Telephone Encounter (Signed)
MEDICATION: omeprazole 20mg  tab, atenolol-chlorthalidone 50-25mg  tab, flonase 73mcg/act nasal spray  PHARMACY: [facility] [address]  IS THIS A 90 DAY SUPPLY : no  IS PATIENT OUT OF MEDICATION: yes  IF NOT; HOW MUCH IS LEFT: 0  LAST APPOINTMENT DATE: @05 /07/2017  NEXT APPOINTMENT DATE:@n /a  OTHER COMMENTS: Completely out of all three medications. Says she has been trying to get through for a few days now   **Let patient know to contact pharmacy at the end of the day to make sure medication is ready. **  ** Please notify patient to allow 48-72 hours to process**  **Encourage patient to contact the pharmacy for refills or they can request refills through [facility]**

## 2017-10-20 DIAGNOSIS — H2513 Age-related nuclear cataract, bilateral: Secondary | ICD-10-CM | POA: Diagnosis not present

## 2017-10-23 ENCOUNTER — Other Ambulatory Visit: Payer: Self-pay

## 2017-10-23 ENCOUNTER — Telehealth: Payer: Self-pay | Admitting: Family Medicine

## 2017-10-23 DIAGNOSIS — E876 Hypokalemia: Secondary | ICD-10-CM

## 2017-10-23 MED ORDER — POTASSIUM CHLORIDE CRYS ER 20 MEQ PO TBCR: 20.0000 meq | EXTENDED_RELEASE_TABLET | Freq: Two times a day (BID) | ORAL | 1 refills | Status: DC

## 2017-10-23 NOTE — Telephone Encounter (Signed)
MEDICATION: potassium chloride SA (K-DUR,KLOR-CON) 20 MEQ tablet  PHARMACY:   [facility] - [place], [place] - [address] [phone] (Phone) [phone] (Fax)     IS THIS A 90 DAY SUPPLY : yes  IS PATIENT OUT OF MEDICATION: yes  IF NOT; HOW MUCH IS LEFT:   LAST APPOINTMENT DATE: @05 /29/18  NEXT APPOINTMENT DATE:@4 /30/2019  OTHER COMMENTS:    **Let patient know to contact pharmacy at the end of the day to make sure medication is ready. **  ** Please notify patient to allow 48-72 hours to process**  **Encourage patient to contact the pharmacy for refills or they can request refills through [facility]**

## 2017-10-23 NOTE — Telephone Encounter (Signed)
Refill sent to patients pharmacy. 

## 2018-04-03 ENCOUNTER — Encounter: Payer: Self-pay | Admitting: Family Medicine

## 2018-04-03 ENCOUNTER — Ambulatory Visit (INDEPENDENT_AMBULATORY_CARE_PROVIDER_SITE_OTHER): Payer: Medicare Other | Admitting: Family Medicine

## 2018-04-03 VITALS — BP 134/86 | HR 74 | Temp 98.2°F | Ht 61.0 in | Wt 156.0 lb

## 2018-04-03 DIAGNOSIS — E785 Hyperlipidemia, unspecified: Secondary | ICD-10-CM | POA: Diagnosis not present

## 2018-04-03 DIAGNOSIS — Z79899 Other long term (current) drug therapy: Secondary | ICD-10-CM

## 2018-04-03 DIAGNOSIS — M8588 Other specified disorders of bone density and structure, other site: Secondary | ICD-10-CM | POA: Diagnosis not present

## 2018-04-03 DIAGNOSIS — Z8719 Personal history of other diseases of the digestive system: Secondary | ICD-10-CM

## 2018-04-03 DIAGNOSIS — K219 Gastro-esophageal reflux disease without esophagitis: Secondary | ICD-10-CM | POA: Diagnosis not present

## 2018-04-03 DIAGNOSIS — I10 Essential (primary) hypertension: Secondary | ICD-10-CM | POA: Diagnosis not present

## 2018-04-03 DIAGNOSIS — Z0000 Encounter for general adult medical examination without abnormal findings: Secondary | ICD-10-CM

## 2018-04-03 LAB — COMPREHENSIVE METABOLIC PANEL
ALBUMIN: 3.7 g/dL (ref 3.5–5.2)
ALT: 9 U/L (ref 0–35)
AST: 14 U/L (ref 0–37)
Alkaline Phosphatase: 112 U/L (ref 39–117)
BUN: 15 mg/dL (ref 6–23)
CALCIUM: 9.5 mg/dL (ref 8.4–10.5)
CHLORIDE: 103 meq/L (ref 96–112)
CO2: 31 mEq/L (ref 19–32)
Creatinine, Ser: 1.08 mg/dL (ref 0.40–1.20)
GFR: 63.07 mL/min (ref >60.00)
Glucose, Bld: 90 mg/dL (ref 70–99)
POTASSIUM: 4 meq/L (ref 3.5–5.1)
Sodium: 140 mEq/L (ref 135–145)
Total Bilirubin: 0.5 mg/dL (ref 0.2–1.2)
Total Protein: 7.1 g/dL (ref 6.0–8.3)

## 2018-04-03 LAB — CBC
HEMATOCRIT: 37.9 % (ref 36.0–46.0)
HEMOGLOBIN: 12.4 g/dL (ref 12.0–15.0)
MCHC: 32.8 g/dL (ref 30.0–36.0)
MCV: 83.3 fl (ref 78.0–100.0)
Platelets: 249 10*3/uL (ref 150.0–400.0)
RBC: 4.55 Mil/uL (ref 3.87–5.11)
RDW: 14.6 % (ref 11.5–15.5)
WBC: 6.1 10*3/uL (ref 4.0–10.5)

## 2018-04-03 LAB — LIPID PANEL
CHOLESTEROL: 184 mg/dL (ref 0–200)
HDL: 50.7 mg/dL (ref >39.00)
LDL CALC: 122 mg/dL — AB (ref 0–99)
NonHDL: 133.33
Total CHOL/HDL Ratio: 4
Triglycerides: 58 mg/dL (ref 0.0–149.0)
VLDL: 11.6 mg/dL (ref 0.0–40.0)

## 2018-04-03 LAB — VITAMIN B12: VITAMIN B 12: 240 pg/mL (ref 211–911)

## 2018-04-03 NOTE — Progress Notes (Signed)
Your CBC was normal (blood counts, infection fighting cells, platelets). Your CMET was normal (kidney, liver, and electrolytes, blood sugar).  Your cholesterol is pretty similar to last year- did not improve that much with daily simvastatin 20mg . Team- can we change her to atorvastatin 20mg  dialy #90 with 3 refills. This is to try to get bad cholesterol to goal of under 100.  Your b12 was normal but low normal- I would make sure to take a multivitamin or plain b12 supplement daily. The reflux medicine can make this level go low.

## 2018-04-03 NOTE — Addendum Note (Signed)
Addended by: Felix Ahmadi A on: 04/03/2018 09:10 AM   Modules accepted: Orders

## 2018-04-03 NOTE — Assessment & Plan Note (Signed)
GERD-controlled on Prilosec.  Lifelong therapy planned with history of life-threatening bleed.

## 2018-04-03 NOTE — Progress Notes (Signed)
Phone: [phone]  Subjective:  Patient presents today for their annual physical. Chief complaint-noted.   See problem oriented charting- ROS- full  review of systems was completed and negative except for: some right knee pain  The following were reviewed and entered/updated in epic: Past Medical History:  Diagnosis Date  . Arthritis   . Bronchitis    hx of  . GERD (gastroesophageal reflux disease)   . History of bleeding ulcers   . History of gastrointestinal hemorrhage    now off aspirin  . Hyperlipidemia   . Hypertension   . Osteoarthritis of right knee 06/03/2012   Patient Active Problem List   Diagnosis Date Noted  . GERD (gastroesophageal reflux disease) 05/27/2014    Priority: Medium  . Hyperlipidemia LDL goal <100 08/21/2008    Priority: Medium  . Essential hypertension 08/21/2008    Priority: Medium  . Osteoarthritis of right knee 06/03/2012    Priority: Low  . 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 History of gastrointestinal hemorrhage    Past Surgical History:  Procedure Laterality Date  . ABDOMINAL HYSTERECTOMY     nonmalignant reasons  . APPENDECTOMY    . 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 OSTEONICS  . 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 Congestive Heart Failure Mother        64, only child  . Other Father        was not in patient life    Medications- reviewed and updated Current Outpatient Medications  Medication Sig Dispense Refill  . atenolol-chlorthalidone (TENORETIC) 50-25 MG tablet Take 0.5 tablets daily by mouth. 45 tablet 1  . Calcium Carbonate-Vitamin D (CALCIUM PLUS VITAMIN D  PO) Take 2.5 tablets by mouth daily.     . fluticasone (FLONASE) 50 MCG/ACT nasal spray Place 2 sprays daily into both nostrils. 16 g 2  . guaiFENesin (MUCINEX) 600 MG 12 hr tablet Take 600 mg by mouth 2 (two) times daily.    Marland Kitchen omeprazole (PRILOSEC) 20 MG capsule Take 1 capsule (20 mg total) daily by mouth. 90 capsule 1  . potassium chloride SA (K-DUR,KLOR-CON) 20 MEQ tablet Take 1 tablet (20 mEq total) 2 (two) times daily by mouth. 180 tablet 1  . simvastatin (ZOCOR) 20 MG tablet Take 1 tablet by mouth everyday 90 tablet 3   No current facility-administered medications for this visit.     Allergies-reviewed and updated Allergies  Allergen Reactions  . Coumadin [Warfarin Sodium]     Ulcers  . Methocarbamol     Hallucinations  . Norco [Hydrocodone-Acetaminophen]     Hallucinations    Social History   Social History Narrative   Lives with graddaughter and 68 year old son (was living alone before)- she is on habitat list   Divorced 1977.       Still doing childcare   Retired from [employer]    Objective: BP 134/86 (BP Location: Left Arm, Patient Position: Sitting, Cuff Size: Large)   Pulse 74   Temp 98.2 F (36.8 C) (Oral)   Ht _0  (1.549 m)   Wt 156 lb (70.8 kg)   SpO2 95%  BMI 29.48 kg/m  Gen: NAD, resting comfortably HEENT: Mucous membranes are moist. Oropharynx normal Neck: no thyromegaly CV: RRR no murmurs rubs or gallops Lungs: CTAB no crackles, wheeze, rhonchi Abdomen: soft/nontender/nondistended/normal bowel sounds. No rebound or guarding.  Ext: no edema Skin: warm, dry Neuro: grossly normal, moves all extremities, PERRLA  Assessment/Plan:  77 y.o. female presenting for annual physical.  Health Maintenance counseling: 1. Anticipatory guidance: Patient counseled regarding regular dental exams -q6 months, eye exams - yearly, wearing seatbelts.  2. Risk factor reduction:  Advised patient of need for regular exercise and diet rich and fruits and  vegetables to reduce risk of heart attack and stroke.  She was 89 last year and we discussed 5 or 10 pound weight loss would be reasonable - she has at least maintained weight .  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 Not doing as much silver sneakers as she would like Wt Readings from Last 3 Encounters:  04/03/18 156 lb (70.8 kg)  04/11/17 151 lb 12.8 oz (68.9 kg)  03/30/17 156 lb 9.6 oz (71 kg)  3. Immunizations/screenings/ancillary studies-discussed Shingrix at pharmacy again this year.  We printed this for her last year. Immunization History  Administered Date(s) Administered  . Influenza Split 09/08/2011, 10/29/2012  . Influenza Whole 08/21/2008, 09/06/2010  . Influenza, High Dose Seasonal PF 09/29/2016  . Influenza,inj,Quad PF,6+ Mos 11/07/2013  . PPD Test 03/24/2017  . Pneumococcal Conjugate-13 11/07/2013  . Pneumococcal Polysaccharide-23 12/05/2005, 09/08/2011  . Td 12/05/2001  . Tdap 10/29/2012  . Zoster 08/21/2008   4. Cervical cancer screening- past age based screening.  No vaginal bleeding 5. Breast cancer screening-  breast exam declined and mammogram 09/07/2017.  She is technically past age based screening but wants to continue screening. 6. Colon cancer screening - she received a letter from Dr. Fuller Plan last year (hyperplastic polyp only 2008).  She said she would call to schedule this.  She did not call to schedule this. She has opted for home stool kit through [facility]- it sounds like either hemoccult or cologuard 7. Birth control/STD check- menopause  Not active. 8. Osteoporosis screening at 32- osteopenia in 2017 with worst T score of -1.6.  Consider repeat 2020 or 2021.  She remains on calcium and vitamin D  Status of chronic or acute concerns   GFR right around 60- we will continue to monitor  Essential hypertension Hypertension- has been controlled when she is taken her  atenolol-chlorthalidone 50-25 mg half tablet before visit.  Has been elevated when she misses dose before visit.  She has to take potassium with this regimen.  GERD (gastroesophageal reflux disease) GERD-controlled on Prilosec.  Lifelong therapy planned with history of life-threatening bleed.  Hyperlipidemia LDL goal <100 Hyperlipidemia-we will update lipids today.  Last year she was on simvastatin 4 times a week.  Goal LDL under 100 but she was above this at 124.  We increased simvastatin to daily at that point.  Return in about 6 months (around 10/03/2018) for follow up- or sooner if needed.  Lab/Order associations: Preventative health care - Plan: CBC, Comprehensive metabolic panel, Lipid panel  Hyperlipidemia LDL goal <100 - Plan: CBC, Comprehensive metabolic panel, Lipid panel  Essential hypertension - Plan: CBC, Comprehensive metabolic panel, Lipid panel  Long-term use of high-risk medication - Plan: Vitamin B12  Return precautions advised.  Garret Reddish, MD

## 2018-04-03 NOTE — Assessment & Plan Note (Signed)
Hyperlipidemia-we will update lipids today.  Last year she was on simvastatin 4 times a week.  Goal LDL under 100 but she was above this at 124.  We increased simvastatin to daily at that point.

## 2018-04-03 NOTE — Patient Instructions (Addendum)
Please stop by lab before you go . You are fasting correct? Let Dr. Durene Cal know if not fasting.   Check with your pharmacy about getting Shingrix. This is a 2 shot series. Let us know when you get this (may be a year or two honestly with shortage)  Let us know whether you are using stool cards or hemocult through [facility]. Make sure we get the results when you get this back.   I would also like for you to sign up for an annual wellness visit with one of our nurses, Cassie or Darl Pikes, who both specialize in the annual wellness visit within the next month if you can. This is a free benefit under medicare that may help [place] find additional ways to help you. Some highlights are reviewing medications, lifestyle, and doing a dementia screen.   Great to see you today!

## 2018-04-03 NOTE — Assessment & Plan Note (Signed)
Hypertension- has been controlled when she is taken her atenolol-chlorthalidone 50-25 mg half tablet before visit.  Has been elevated when she misses dose before visit.  She has to take potassium with this regimen.

## 2018-04-04 ENCOUNTER — Other Ambulatory Visit: Payer: Self-pay

## 2018-04-04 ENCOUNTER — Telehealth: Payer: Self-pay

## 2018-04-04 MED ORDER — ATORVASTATIN CALCIUM 20 MG PO TABS: 20.0000 mg | ORAL_TABLET | Freq: Every day | ORAL | 3 refills | Status: DC

## 2018-04-04 NOTE — Telephone Encounter (Signed)
Called patient and gave lab results. Patient had no follow up question. I sent in atorvastatin for her. Patient verbalized understanding.

## 2018-04-13 ENCOUNTER — Other Ambulatory Visit: Payer: Self-pay | Admitting: Family Medicine

## 2018-04-13 DIAGNOSIS — K219 Gastro-esophageal reflux disease without esophagitis: Secondary | ICD-10-CM

## 2018-04-13 DIAGNOSIS — I10 Essential (primary) hypertension: Secondary | ICD-10-CM

## 2018-04-26 ENCOUNTER — Telehealth: Payer: Self-pay

## 2018-04-26 ENCOUNTER — Encounter: Payer: Self-pay | Admitting: *Deleted

## 2018-04-26 ENCOUNTER — Ambulatory Visit (INDEPENDENT_AMBULATORY_CARE_PROVIDER_SITE_OTHER): Payer: Medicare Other | Admitting: *Deleted

## 2018-04-26 VITALS — BP 134/80 | HR 94 | Ht 61.0 in | Wt 155.0 lb

## 2018-04-26 DIAGNOSIS — Z0000 Encounter for general adult medical examination without abnormal findings: Secondary | ICD-10-CM

## 2018-04-26 MED ORDER — SIMVASTATIN 40 MG PO TABS: ORAL_TABLET | ORAL | 3 refills | Status: DC

## 2018-04-26 NOTE — Patient Instructions (Addendum)
Maria [name] , Thank you for taking time to come for your Medicare Wellness Visit. I appreciate your ongoing commitment to your health goals. Please review the following plan we discussed and let me know if I can assist you in the future.  You need 1252m of calcium every day. You are most likely getting this with your food and one supplement You calcium pill as 400 u of Vit D3 You can increase by 400u  [facility] today Per Dr. HYong Channel reordered you Simvastatin (which you have at home) at 40 mg by mouth every day.  You will take 2 20's until you need the refill.  Also, dr. HYong Channelfeels the hemacult or stool check is not beneficial for hx of polyps. The cologuard is only covered through 719 so this is not options. He would prefer you discuss with Gastro Doctor, Dr. SFuller Planso please give him a call    Shingrix is a vaccine for the prevention of Shingles in Adults 559and older.  If you are on Medicare, the shingrix is covered under your Part D plan, so you will take both of the vaccines in the series at your pharmacy. Please check with your benefits regarding applicable copays or out of pocket expenses.  The Shingrix is given in 2 vaccines approx 8 weeks apart. You must receive the 2nd dose prior to 6 months from receipt of the first. Please have the pharmacist print out you Immunization  dates for our office records       These are the goals we discussed: Goals    . Patient Stated     Keep living for God and do the best I can    . Weight (lb) < 145 lb (65.8 kg)     Works 3 days with children;  Line dancing one day Pick up 2 days of walking Replace one meal with shake  Fat free or low fat dairy products Fish high in omega-3 acids ( salmon, tuna, trout) Fruits, such as apples, bananas, oranges, pears, prunes Legumes, such as kidney beans, lentils, checkpeas, black-eyed peas and lima beans Vegetables; broccoli,  cabbage, carrots Whole grains;   Plant fats are better; decrease "white" foods as pasta, rice, bread and desserts, sugar; Avoid red meat (limiting) palm and coconut oils; sugary foods and beverages  Two nutrients that raise blood chol levels are saturated fats and trans fat; in hydrogenated oils and fats, as stick margarine, baked goods (cookes, cakes, pies, crackers; frosting; and coffee creamers;   Some Fats lower cholesterol: Monounsaturated and polyunsaturated  Avocados Corn, sunflower, and soybean oils Nuts and seeds, such as walnuts Olive, canola, peanut, safflower, and sesame oils Peanut butter Salmon and trout Tofu          This is a list of the screening recommended for you and due dates:  Health Maintenance  Topic Date Due  . Flu Shot  07/05/2018  . Mammogram  09/07/2018  . Tetanus Vaccine  10/29/2022  . DEXA scan (bone density measurement)  Completed  . Pneumonia vaccines  Completed      Fall Prevention in the Home Falls can cause injuries. They can happen to people of all ages. There are many things you can do to make your home safe and to help prevent falls. What can I do on the outside of my home?  Regularly fix the edges of walkways and driveways and fix any cracks.  Remove anything  that might make you trip as you walk through a door, such as a raised step or threshold.  Trim any bushes or trees on the path to your home.  Use bright outdoor lighting.  Clear any walking paths of anything that might make someone trip, such as rocks or tools.  Regularly check to see if handrails are loose or broken. Make sure that both sides of any steps have handrails.  Any raised decks and porches should have guardrails on the edges.  Have any leaves, snow, or ice cleared regularly.  Use sand or salt on walking paths during winter.  Clean up any spills in your garage right away. This includes oil or grease spills. What can I do in the bathroom?  Use night  lights.  Install grab bars by the toilet and in the tub and shower. Do not use towel bars as grab bars.  Use non-skid mats or decals in the tub or shower.  If you need to sit down in the shower, use a plastic, non-slip stool.  Keep the floor dry. Clean up any water that spills on the floor as soon as it happens.  Remove soap buildup in the tub or shower regularly.  Attach bath mats securely with double-sided non-slip rug tape.  Do not have throw rugs and other things on the floor that can make you trip. What can I do in the bedroom?  Use night lights.  Make sure that you have a light by your bed that is easy to reach.  Do not use any sheets or blankets that are too big for your bed. They should not hang down onto the floor.  Have a firm chair that has side arms. You can use this for support while you get dressed.  Do not have throw rugs and other things on the floor that can make you trip. What can I do in the kitchen?  Clean up any spills right away.  Avoid walking on wet floors.  Keep items that you use a lot in easy-to-reach places.  If you need to reach something above you, use a strong step stool that has a grab bar.  Keep electrical cords out of the way.  Do not use floor polish or wax that makes floors slippery. If you must use wax, use non-skid floor wax.  Do not have throw rugs and other things on the floor that can make you trip. What can I do with my stairs?  Do not leave any items on the stairs.  Make sure that there are handrails on both sides of the stairs and use them. Fix handrails that are broken or loose. Make sure that handrails are as long as the stairways.  Check any carpeting to make sure that it is firmly attached to the stairs. Fix any carpet that is loose or worn.  Avoid having throw rugs at the top or bottom of the stairs. If you do have throw rugs, attach them to the floor with carpet tape.  Make sure that you have a light switch at the  top of the stairs and the bottom of the stairs. If you do not have them, ask someone to add them for you. What else can I do to help prevent falls?  Wear shoes that: ? Do not have high heels. ? Have rubber bottoms. ? Are comfortable and fit you well. ? Are closed at the toe. Do not wear sandals.  If you use a stepladder: ? Make sure  that it is fully opened. Do not climb a closed stepladder. ? Make sure that both sides of the stepladder are locked into place. ? Ask someone to hold it for you, if possible.  Clearly mark and make sure that you can see: ? Any grab bars or handrails. ? First and last steps. ? Where the edge of each step is.  Use tools that help you move around (mobility aids) if they are needed. These include: ? Canes. ? Walkers. ? Scooters. ? Crutches.  Turn on the lights when you go into a dark area. Replace any light bulbs as soon as they burn out.  Set up your furniture so you have a clear path. Avoid moving your furniture around.  If any of your floors are uneven, fix them.  If there are any pets around you, be aware of where they are.  Review your medicines with your doctor. Some medicines can make you feel dizzy. This can increase your chance of falling. Ask your doctor what other things that you can do to help prevent falls. This information is not intended to replace advice given to you by your health care provider. Make sure you discuss any questions you have with your health care provider. Document Released: 09/17/2009 Document Revised: 04/28/2016 Document Reviewed: 12/26/2014 Elsevier Interactive Patient Education  2018 [place] Maintenance, Female Adopting a healthy lifestyle and getting preventive care can go a long way to promote health and wellness. Talk with your health care provider about what schedule of regular examinations is right for you. This is a good chance for you to check in with your provider about disease prevention and  staying healthy. In between checkups, there are plenty of things you can do on your own. Experts have done a lot of research about which lifestyle changes and preventive measures are most likely to keep you healthy. Ask your health care provider for more information. Weight and diet Eat a healthy diet  Be sure to include plenty of vegetables, fruits, low-fat dairy products, and lean protein.  Do not eat a lot of foods high in solid fats, added sugars, or salt.  Get regular exercise. This is one of the most important things you can do for your health. ? Most adults should exercise for at least 150 minutes each week. The exercise should increase your heart rate and make you sweat (moderate-intensity exercise). ? Most adults should also do strengthening exercises at least twice a week. This is in addition to the moderate-intensity exercise.  Maintain a healthy weight  Body mass index (BMI) is a measurement that can be used to identify possible weight problems. It estimates body fat based on height and weight. Your health care provider can help determine your BMI and help you achieve or maintain a healthy weight.  For females 93 years of age and older: ? A BMI below 18.5 is considered underweight. ? A BMI of 18.5 to 24.9 is normal. ? A BMI of 25 to 29.9 is considered overweight. ? A BMI of 30 and above is considered obese.  Watch levels of cholesterol and blood lipids  You should start having your blood tested for lipids and cholesterol at 78 years of age, then have this test every 5 years.  You may need to have your cholesterol levels checked more often if: ? Your lipid or cholesterol levels are high. ? You are older than 78 years of age. ? You are at high risk for heart disease.  Cancer screening Lung Cancer  Lung cancer screening is recommended for adults 78-57 years old who are at high risk for lung cancer because of a history of smoking.  A yearly low-dose CT scan of the lungs  is recommended for people who: ? Currently smoke. ? Have quit within the past 15 years. ? Have at least a 30-pack-year history of smoking. A pack year is smoking an average of one pack of cigarettes a day for 1 year.  Yearly screening should continue until it has been 15 years since you quit.  Yearly screening should stop if you develop a health problem that would prevent you from having lung cancer treatment.  Breast Cancer  Practice breast self-awareness. This means understanding how your breasts normally appear and feel.  It also means doing regular breast self-exams. Let your health care provider know about any changes, no matter how small.  If you are in your 20s or 30s, you should have a clinical breast exam (CBE) by a health care provider every 1-3 years as part of a regular health exam.  If you are 93 or older, have a CBE every year. Also consider having a breast X-ray (mammogram) every year.  If you have a family history of breast cancer, talk to your health care provider about genetic screening.  If you are at high risk for breast cancer, talk to your health care provider about having an MRI and a mammogram every year.  Breast cancer gene (BRCA) assessment is recommended for women who have family members with BRCA-related cancers. BRCA-related cancers include: ? Breast. ? Ovarian. ? Tubal. ? Peritoneal cancers.  Results of the assessment will determine the need for genetic counseling and BRCA1 and BRCA2 testing.  Cervical Cancer Your health care provider may recommend that you be screened regularly for cancer of the pelvic organs (ovaries, uterus, and vagina). This screening involves a pelvic examination, including checking for microscopic changes to the surface of your cervix (Pap test). You may be encouraged to have this screening done every 3 years, beginning at age 6.  For women ages 69-65, health care providers may recommend pelvic exams and Pap testing every 3  years, or they may recommend the Pap and pelvic exam, combined with testing for human papilloma virus (HPV), every 5 years. Some types of HPV increase your risk of cervical cancer. Testing for HPV may also be done on women of any age with unclear Pap test results.  Other health care providers may not recommend any screening for nonpregnant women who are considered low risk for pelvic cancer and who do not have symptoms. Ask your health care provider if a screening pelvic exam is right for you.  If you have had past treatment for cervical cancer or a condition that could lead to cancer, you need Pap tests and screening for cancer for at least 20 years after your treatment. If Pap tests have been discontinued, your risk factors (such as having a new sexual partner) need to be reassessed to determine if screening should resume. Some women have medical problems that increase the chance of getting cervical cancer. In these cases, your health care provider may recommend more frequent screening and Pap tests.  Colorectal Cancer  This type of cancer can be detected and often prevented.  Routine colorectal cancer screening usually begins at 78 years of age and continues through 78 years of age.  Your health care provider may recommend screening at an earlier age if you have risk factors  for colon cancer.  Your health care provider may also recommend using home test kits to check for hidden blood in the stool.  A small camera at the end of a tube can be used to examine your colon directly (sigmoidoscopy or colonoscopy). This is done to check for the earliest forms of colorectal cancer.  Routine screening usually begins at age 64.  Direct examination of the colon should be repeated every 5-10 years through 78 years of age. However, you may need to be screened more often if early forms of precancerous polyps or small growths are found.  Skin Cancer  Check your skin from head to toe regularly.  Tell  your health care provider about any new moles or changes in moles, especially if there is a change in a mole's shape or color.  Also tell your health care provider if you have a mole that is larger than the size of a pencil eraser.  Always use sunscreen. Apply sunscreen liberally and repeatedly throughout the day.  Protect yourself by wearing long sleeves, pants, a wide-brimmed hat, and sunglasses whenever you are outside.  Heart disease, diabetes, and high blood pressure  High blood pressure causes heart disease and increases the risk of stroke. High blood pressure is more likely to develop in: ? People who have blood pressure in the high end of the normal range (130-139/85-89 mm Hg). ? People who are overweight or obese. ? People who are African American.  If you are 55-34 years of age, have your blood pressure checked every 3-5 years. If you are 19 years of age or older, have your blood pressure checked every year. You should have your blood pressure measured twice-once when you are at a hospital or clinic, and once when you are not at a hospital or clinic. Record the average of the two measurements. To check your blood pressure when you are not at a hospital or clinic, you can use: ? An automated blood pressure machine at a pharmacy. ? A home blood pressure monitor.  If you are between 42 years and 46 years old, ask your health care provider if you should take aspirin to prevent strokes.  Have regular diabetes screenings. This involves taking a blood sample to check your fasting blood sugar level. ? If you are at a normal weight and have a low risk for diabetes, have this test once every three years after 78 years of age. ? If you are overweight and have a high risk for diabetes, consider being tested at a younger age or more often. Preventing infection Hepatitis B  If you have a higher risk for hepatitis B, you should be screened for this virus. You are considered at high risk for  hepatitis B if: ? You were born in a country where hepatitis B is common. Ask your health care provider which countries are considered high risk. ? Your parents were born in a high-risk country, and you have not been immunized against hepatitis B (hepatitis B vaccine). ? You have HIV or AIDS. ? You use needles to inject street drugs. ? You live with someone who has hepatitis B. ? You have had sex with someone who has hepatitis B. ? You get hemodialysis treatment. ? You take certain medicines for conditions, including cancer, organ transplantation, and autoimmune conditions.  Hepatitis C  Blood testing is recommended for: ? Everyone born from [birth year] through 1966. ? Anyone with known risk factors for hepatitis C.  Sexually transmitted infections (STIs)  You should be screened for sexually transmitted infections (STIs) including gonorrhea and chlamydia if: ? You are sexually active and are younger than 78 years of age. ? You are older than 78 years of age and your health care provider tells you that you are at risk for this type of infection. ? Your sexual activity has changed since you were last screened and you are at an increased risk for chlamydia or gonorrhea. Ask your health care provider if you are at risk.  If you do not have HIV, but are at risk, it may be recommended that you take a prescription medicine daily to prevent HIV infection. This is called pre-exposure prophylaxis (PrEP). You are considered at risk if: ? You are sexually active and do not regularly use condoms or know the HIV status of your partner(s). ? You take drugs by injection. ? You are sexually active with a partner who has HIV.  Talk with your health care provider about whether you are at high risk of being infected with HIV. If you choose to begin PrEP, you should first be tested for HIV. You should then be tested every 3 months for as long as you are taking PrEP. Pregnancy  If you are premenopausal and you may  become pregnant, ask your health care provider about preconception counseling.  If you may become pregnant, take 400 to 800 micrograms (mcg) of folic acid every day.  If you want to prevent pregnancy, talk to your health care provider about birth control (contraception). Osteoporosis and menopause  Osteoporosis is a disease in which the bones lose minerals and strength with aging. This can result in serious bone fractures. Your risk for osteoporosis can be identified using a bone density scan.  If you are 38 years of age or older, or if you are at risk for osteoporosis and fractures, ask your health care provider if you should be screened.  Ask your health care provider whether you should take a calcium or vitamin D supplement to lower your risk for osteoporosis.  Menopause may have certain physical symptoms and risks.  Hormone replacement therapy may reduce some of these symptoms and risks. Talk to your health care provider about whether hormone replacement therapy is right for you. Follow these instructions at home:  Schedule regular health, dental, and eye exams.  Stay current with your immunizations.  Do not use any tobacco products including cigarettes, chewing tobacco, or electronic cigarettes.  If you are pregnant, do not drink alcohol.  If you are breastfeeding, limit how much and how often you drink alcohol.  Limit alcohol intake to no more than 1 drink per day for nonpregnant women. One drink equals 12 ounces of beer, 5 ounces of wine, or 1 ounces of hard liquor.  Do not use street drugs.  Do not share needles.  Ask your health care provider for help if you need support or information about quitting drugs.  Tell your health care provider if you often feel depressed.  Tell your health care provider if you have ever been abused or do not feel safe at home. This information is not intended to replace advice given to you by your health care provider. Make sure you  discuss any questions you have with your health care provider. Document Released: 06/06/2011 Document Revised: 04/28/2016 Document Reviewed: 08/25/2015 Elsevier Interactive Patient Education  [employer].

## 2018-04-26 NOTE — Progress Notes (Signed)
Subjective:   Maria [name] is a 77 y.o. female who presents for Medicare Annual (Subsequent) preventive examination.  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 no Safety; community, wears sunscreen, safe place for firearms; Motor vehicle accidents; no     There are no preventive care reminders to display for this patient.  Colonoscopy was completed 03/2007; was suppose to fup last year Followed up with Dr. Ardell Isaacs office last year and the office was going to outreach rec'd hemocult from [facility] and will discuss this vs cologuard   Mammogram 09/2017-  Dexa 10/2016 -1.7 per dr. Durene Cal; will repeat 2020 per Dr. Durene Cal   Zoster 2009; educated regarding shingrix      Objective:     Vitals: BP 134/80   Pulse 94   Ht 5\' 1"  (1.549 m)   Wt 155 lb (70.3 kg)   SpO2 96%   BMI 29.29 kg/m   Body mass index is 29.29 kg/m.  Advanced Directives 04/26/2018 10/13/2016 05/06/2014 05/22/2012  Does Patient Have a Medical Advance Directive? Yes Yes Patient has advance directive, copy not in chart Patient does not have advance directive  Type of Advance Directive - - Living will -  Copy of Healthcare Power of Attorney in Chart? - No - copy requested - -    Pre-existing out of facility DNR order (yellow form or pink MOST form) - - No -    Tobacco Social History   Tobacco Use  Smoking Status Never Smoker  Smokeless Tobacco Never Used     Counseling given: Yes   Clinical Intake:   Past Medical History:  Diagnosis Date  . Arthritis   . Bronchitis    hx of  . GERD (gastroesophageal reflux disease)   . History of bleeding ulcers   . History of gastrointestinal hemorrhage    now off aspirin  . Hyperlipidemia   . Hypertension   . Osteoarthritis of right knee 06/03/2012   Past Surgical History:  Procedure Laterality Date  . ABDOMINAL HYSTERECTOMY     nonmalignant reasons  . APPENDECTOMY    . TOTAL KNEE ARTHROPLASTY  05/30/2012   Procedure: TOTAL KNEE ARTHROPLASTY;  Surgeon: Loreta Ave, MD;  Location: [facility] OR;  Service: Orthopedics;  Laterality: Right;  DR MURPHY WANTS 90 MINUTES FOR THIS CASE. OSTEONICS  . 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 Congestive Heart Failure Mother  96, only child  . Other Father        was not in patient life   Social History   Socioeconomic History  . Marital status: Divorced    Spouse name: Not on file  . Number of children: Not on file  . Years of education: Not on file  . Highest education level: Not on file  Occupational History  . Not on file  Social Needs  . Financial resource strain: Not on file  . Food insecurity:    Worry: Not on file    Inability: Not on file  . Transportation needs:    Medical: Not on file    Non-medical: Not on file  Tobacco Use  . Smoking status: Never Smoker  . Smokeless tobacco: Never Used  Substance and Sexual Activity  . Alcohol use: No  . Drug use: No  . Sexual activity: Not on file  Lifestyle  . Physical activity:    Days per week: Not on file     Minutes per session: Not on file  . Stress: Not on file  Relationships  . Social connections:    Talks on phone: Not on file    Gets together: Not on file    Attends religious service: Not on file    Active member of club or organization: Not on file    Attends meetings of clubs or organizations: Not on file    Relationship status: Not on file  Other Topics Concern  . Not on file  Social History Narrative   Lives with graddaughter and 91 year old son (was living alone before)- she is on habitat list   Divorced 1977.       Still doing childcare   Retired from [employer] Encounter Medications as of 04/26/2018  Medication Sig  . atenolol-chlorthalidone (TENORETIC) 50-25 MG tablet TAKE 1/2 TABLET BY MOUTH ONCE DAILY  . Calcium Carbonate-Vitamin D (CALCIUM PLUS VITAMIN D PO) Take 2.5 tablets by mouth daily.   . fluticasone (FLONASE) 50 MCG/ACT nasal spray Place 2 sprays daily into both nostrils.  Marland Kitchen guaiFENesin (MUCINEX) 600 MG 12 hr tablet Take 600 mg by mouth 2 (two) times daily.  Marland Kitchen omeprazole (PRILOSEC) 20 MG capsule TAKE 1 CAPSULE BY MOUTH ONCE DAILY  . potassium chloride SA (K-DUR,KLOR-CON) 20 MEQ tablet Take 1 tablet (20 mEq total) 2 (two) times daily by mouth.  . [DISCONTINUED] atorvastatin (LIPITOR) 20 MG tablet Take 1 tablet (20 mg total) by mouth daily.  . simvastatin (ZOCOR) 40 MG tablet Take 1 tablet by mouth everyday  . [DISCONTINUED] simvastatin (ZOCOR) 20 MG tablet Take 1 tablet by mouth everyday (Patient not taking: Reported on 04/26/2018)   No facility-administered encounter medications on file as of 04/26/2018.     Activities of Daily Living In your present state of health, do you have any difficulty performing the following activities: 04/26/2018  Hearing? N  Vision? N  Difficulty concentrating or making decisions? N  Comment living independently  Walking or climbing stairs? N  Dressing or bathing? N  Doing errands, shopping? N  Preparing Food and  eating ? N  Using the Toilet? N  In the past six months, have you accidently leaked urine? N  Do you have problems with loss of bowel control? N  Managing your Medications? Y  Comment financial limitations  Managing your Finances? N  Housekeeping or managing your Housekeeping? N  Some recent data might be hidden    Patient Care  Team: Shelva Majestic, MD as PCP - General (Family Medicine)    Assessment:   This is a routine wellness examination for Halie.  Exercise Activities and Dietary recommendations Current Exercise Habits: Home exercise routine, Type of exercise: walking, Time (Minutes): 60(still working 3 days; working now 5 days a week), Frequency (Times/Week): 5, Weekly Exercise (Minutes/Week): 300, Intensity: Mild  Goals    . Patient Stated     Keep living for God and do the best I can    . Weight (lb) < 145 lb (65.8 kg)     Works 3 days with children;  Line dancing one day Pick up 2 days of walking Replace one meal with shake  Fat free or low fat dairy products Fish high in omega-3 acids ( salmon, tuna, trout) Fruits, such as apples, bananas, oranges, pears, prunes Legumes, such as kidney beans, lentils, checkpeas, black-eyed peas and lima beans Vegetables; broccoli, cabbage, carrots Whole grains;   Plant fats are better; decrease "white" foods as pasta, rice, bread and desserts, sugar; Avoid red meat (limiting) palm and coconut oils; sugary foods and beverages  Two nutrients that raise blood chol levels are saturated fats and trans fat; in hydrogenated oils and fats, as stick margarine, baked goods (cookes, cakes, pies, crackers; frosting; and coffee creamers;   Some Fats lower cholesterol: Monounsaturated and polyunsaturated  Avocados Corn, sunflower, and soybean oils Nuts and seeds, such as walnuts Olive, canola, peanut, safflower, and sesame oils Peanut butter Salmon and trout Tofu          Fall Risk Fall Risk  04/26/2018 04/03/2018 10/13/2016 03/29/2016  02/27/2014  Falls in the past year? No No Yes No No  Number falls in past yr: - - 1 - -  Follow up - - Education provided - -  Comment - - resources to assist w putting handrals up in bathroom - -     Depression Screen PHQ 2/9 Scores 04/26/2018 04/03/2018 10/13/2016 03/29/2016  PHQ - 2 Score 0 0 0 0     Cognitive Function MMSE - Mini Mental State Exam 04/26/2018 10/13/2016  Not completed: (No Data) (No Data)     6CIT Screen 04/26/2018  What Year? 0 points  What month? 0 points  What time? 0 points  Count back from 20 0 points  Months in reverse 0 points  Repeat phrase 0 points  Total Score 0    Immunization History  Administered Date(s) Administered  . Influenza Split 09/08/2011, 10/29/2012  . Influenza Whole 08/21/2008, 09/06/2010  . Influenza, High Dose Seasonal PF 09/29/2016  . Influenza,inj,Quad PF,6+ Mos 11/07/2013  . PPD Test 03/24/2017  . Pneumococcal Conjugate-13 11/07/2013  . Pneumococcal Polysaccharide-23 12/05/2005, 09/08/2011  . Td 12/05/2001  . Tdap 10/29/2012  . Zoster 08/21/2008        Screening Tests Health Maintenance  Topic Date Due  . INFLUENZA VACCINE  07/05/2018  . MAMMOGRAM  09/07/2018  . TETANUS/TDAP  10/29/2022  . DEXA SCAN  Completed  . PNA vac Low Risk Adult  Completed       Plan:      PCP Notes   Health Maintenance Mammogram annually  Dexa osteopenia; -1.7 and per Dr. Durene Cal note, may repeat 2020  Educated regarding the shingrix   Abnormal Screens  none  Referrals  none  Patient concerns; Meds: can't afford to pick up refills on atorvastatin 20mg  or omeprazole. Per Dr. Durene Cal  ; will d/c atorvastatin and re-order Simvastatin at 40mg  po qd per Verbal order/SH  RN  Ms. Deschamp has Simvastatin at home and will start taking her 20mg  (2 pills) until she needs to refill new rx. Complaint with other meds;   Also, [employer] brought her a hemacult test; Per Dr. Durene Cal, please schedule an apt with Dr. Russella Dar to discuss her options and  need for colonoscopy . The patient agreed to fup   Nurse Concerns; As noted;  Did not state why she did not have money for refills, but does have food at home   Next PCP apt In October for 6 month check up      I have personally reviewed and noted the following in the patient's chart:   . Medical and social history . Use of alcohol, tobacco or illicit drugs  . Current medications and supplements . Functional ability and status . Nutritional status . Physical activity . Advanced directives . List of other physicians . Hospitalizations, surgeries, and ER visits in previous 12 months . Vitals . Screenings to include cognitive, depression, and falls . Referrals and appointments  In addition, I have reviewed and discussed with patient certain preventive protocols, quality metrics, and best practice recommendations. A written personalized care plan for preventive services as well as general preventive health recommendations were provided to patient.     Montine Circle, RN  04/26/2018

## 2018-04-26 NOTE — Progress Notes (Signed)
I have reviewed and agree with note, evaluation, plan.   Thank you for setting up the transition back to simvastatin at higher dose as that may be more cost effective.   Tana Conch, MD

## 2018-04-26 NOTE — Telephone Encounter (Signed)
Call to [facility] to confirm receipt of px for simvastatin 40mg  po qd. Noted the patient does have simvastatin at home and will take until she needs refill. Discontinued the Atorvastatin 20mg  due to cost. Receipt confirmed  Montine Circle RN

## 2018-05-19 ENCOUNTER — Other Ambulatory Visit: Payer: Self-pay | Admitting: Family Medicine

## 2018-05-19 DIAGNOSIS — E876 Hypokalemia: Secondary | ICD-10-CM

## 2018-06-08 ENCOUNTER — Telehealth: Payer: Self-pay | Admitting: Family Medicine

## 2018-06-08 NOTE — Telephone Encounter (Unsigned)
Copied from CRM [id number]. Topic: General - Other >> Jun 08, 2018  9:38 AM Gaynelle Adu wrote: Reason for CRM: patient is calling to see if its okay to take a full tablet of  atenolol-chlorthalidone (TENORETIC) 50-25 MG  instead of half.  She is trying to get her blood pressure back on track.  Please advise

## 2018-06-08 NOTE — Telephone Encounter (Signed)
Called and left a detailed voicemail message letting patient know she can take a full tablet and to let us know if her heart rate gets below 55. I also provided a call back number for her to schedule an appointment

## 2018-06-08 NOTE — Telephone Encounter (Signed)
She may take a full tablet. I want to know if her heart rate on this gets below 55. Lets set up an in office check within the month to make sure doing better on higher dose and shes tolerating it ok

## 2018-06-08 NOTE — Telephone Encounter (Signed)
See note

## 2018-07-13 ENCOUNTER — Telehealth: Payer: Self-pay | Admitting: Family Medicine

## 2018-07-13 DIAGNOSIS — I10 Essential (primary) hypertension: Secondary | ICD-10-CM

## 2018-07-13 MED ORDER — ATENOLOL-CHLORTHALIDONE 50-25 MG PO TABS
1.0000 | ORAL_TABLET | Freq: Every day | ORAL | 0 refills | Status: DC
Start: 2018-07-13 — End: 2018-07-23

## 2018-07-13 NOTE — Telephone Encounter (Signed)
Copied from CRM [id number]. Topic: Quick Communication - See Telephone Encounter >> Jul 13, 2018  8:28 AM Tamela Oddi, NT wrote: CRM for notification. See Telephone encounter for: 07/13/18. Patient called and states that she needs a refill of her atenolol-chlorthalidone (TENORETIC) 50-25 MG tablet . She states she called the pharmacy and they states it was too early to fill. She states that Dr. Durene Cal told her to take a whole tablet instead of a 1/2 tablet now she is out.  [facility] [phone] - [place], [place] - [address] [phone] (Phone) [phone] (Fax)

## 2018-07-13 NOTE — Telephone Encounter (Signed)
Atenolol-Chlorthalidone 50-25 mg tab. refill Last Refill: 04/13/18 # #45, RF x 1 Last OV: 04/03/18; AWV 04/26/18 PCP: Dr. Durene Cal Pharmacy:[facility] on [address]  Noted phone note of 06/08/18, recommendation to increase dose to one tablet daily.  Refill given for a 10 day supply, as pt. scheduled for f/u/ med check on 07/23/18

## 2018-07-23 ENCOUNTER — Ambulatory Visit (INDEPENDENT_AMBULATORY_CARE_PROVIDER_SITE_OTHER): Payer: Medicare Other | Admitting: Family Medicine

## 2018-07-23 ENCOUNTER — Encounter: Payer: Self-pay | Admitting: Family Medicine

## 2018-07-23 VITALS — BP 130/72 | HR 72 | Temp 98.4°F | Ht 61.0 in | Wt 151.0 lb

## 2018-07-23 DIAGNOSIS — Z79899 Other long term (current) drug therapy: Secondary | ICD-10-CM

## 2018-07-23 DIAGNOSIS — I10 Essential (primary) hypertension: Secondary | ICD-10-CM

## 2018-07-23 DIAGNOSIS — E785 Hyperlipidemia, unspecified: Secondary | ICD-10-CM

## 2018-07-23 DIAGNOSIS — K219 Gastro-esophageal reflux disease without esophagitis: Secondary | ICD-10-CM

## 2018-07-23 LAB — COMPREHENSIVE METABOLIC PANEL
ALBUMIN: 3.8 g/dL (ref 3.5–5.2)
ALT: 11 U/L (ref 0–35)
AST: 13 U/L (ref 0–37)
Alkaline Phosphatase: 120 U/L — ABNORMAL HIGH (ref 39–117)
BUN: 17 mg/dL (ref 6–23)
CHLORIDE: 103 meq/L (ref 96–112)
CO2: 30 mEq/L (ref 19–32)
CREATININE: 1.28 mg/dL — AB (ref 0.40–1.20)
Calcium: 9.7 mg/dL (ref 8.4–10.5)
GFR: 51.8 mL/min — ABNORMAL LOW (ref >60.00)
Glucose, Bld: 141 mg/dL — ABNORMAL HIGH (ref 70–99)
Potassium: 3.7 mEq/L (ref 3.5–5.1)
SODIUM: 141 meq/L (ref 135–145)
Total Bilirubin: 0.7 mg/dL (ref 0.2–1.2)
Total Protein: 7.2 g/dL (ref 6.0–8.3)

## 2018-07-23 LAB — LDL CHOLESTEROL, DIRECT: Direct LDL: 100 mg/dL

## 2018-07-23 LAB — VITAMIN B12: VITAMIN B 12: 432 pg/mL (ref 211–911)

## 2018-07-23 MED ORDER — ATENOLOL-CHLORTHALIDONE 50-25 MG PO TABS: 1.0000 | ORAL_TABLET | Freq: Every day | ORAL | 3 refills | Status: DC

## 2018-07-23 MED ORDER — ATORVASTATIN CALCIUM 40 MG PO TABS: 40.0000 mg | ORAL_TABLET | Freq: Every day | ORAL | 3 refills | Status: DC

## 2018-07-23 MED ORDER — FLUTICASONE PROPIONATE 50 MCG/ACT NA SUSP: 2.0000 | Freq: Every day | NASAL | 3 refills | Status: DC

## 2018-07-23 NOTE — Assessment & Plan Note (Signed)
S: poorly controlled on simvastatin 20mg . Now on atorvastatin 20mg - but taking 2 tablets . She is down a few lbs despite being at the beach last week.  Lab Results  Component Value Date   CHOL 184 04/03/2018   HDL 50.70 04/03/2018   LDLCALC 122 (H) 04/03/2018   LDLDIRECT 156.9 11/07/2013   TRIG 58.0 04/03/2018   CHOLHDL 4 04/03/2018   A/P: update LDL- hoping under 100 at least

## 2018-07-23 NOTE — Assessment & Plan Note (Signed)
S: controlled on atenolol-chlorthalidone 50-25mg  daily- now on full tab. Takes potassium due to diuretic BP Readings from Last 3 Encounters:  07/23/18 130/72  04/26/18 134/80  04/03/18 134/86  A/P: We discussed blood pressure goal of <140/90. Continue current meds:

## 2018-07-23 NOTE — Progress Notes (Signed)
Subjective:  Maria [name] is a 77 y.o. year old very pleasant female patient who presents for/with See problem oriented charting ROS- No chest pain or shortness of breath. No headache or blurry vision.  Occasional cough with PND.   Past Medical History-  Patient Active Problem List   Diagnosis Date Noted  . GERD (gastroesophageal reflux disease) 05/27/2014    Priority: Medium  . Hyperlipidemia LDL goal <100 08/21/2008    Priority: Medium  . Essential hypertension 08/21/2008    Priority: Medium  . 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 History of gastrointestinal hemorrhage     Priority: Low  . Osteoarthritis of right knee 06/03/2012    Priority: Low    Medications- reviewed and updated Current Outpatient Medications  Medication Sig Dispense Refill  . atenolol-chlorthalidone (TENORETIC) 50-25 MG tablet Take 1 tablet by mouth daily. 90 tablet 3  . atorvastatin (LIPITOR) 40 MG tablet Take 1 tablet (40 mg total) by mouth daily. 90 tablet 3  . Calcium Carbonate-Vitamin D (CALCIUM PLUS VITAMIN D PO) Take 2.5 tablets by mouth daily.     . fluticasone (FLONASE) 50 MCG/ACT nasal spray Place 2 sprays into both nostrils daily. 48 g 3  . guaiFENesin (MUCINEX) 600 MG 12 hr tablet Take 600 mg by mouth 2 (two) times daily.    Marland Kitchen omeprazole (PRILOSEC) 20 MG capsule TAKE 1 CAPSULE BY MOUTH ONCE DAILY 90 capsule 1  . potassium chloride SA (K-DUR,KLOR-CON) 20 MEQ tablet TAKE 1 TABLET BY MOUTH TWICE A DAY 180 tablet 1   No current facility-administered medications for this visit.     Objective: BP 130/72 (BP Location: Left Arm, Patient Position: Sitting, Cuff Size: Normal)   Pulse 72   Temp 98.4 F (36.9 C) (Oral)   Ht 5\' 1"  (1.549 m)   Wt 151 lb (68.5 kg)   SpO2 95%   BMI 28.53 kg/m  Gen: NAD, resting comfortably, excellent energy level for age CV: RRR no murmurs rubs or gallops Lungs: CTAB no crackles, wheeze, rhonchi Abdomen: soft/nontender/nondistended/normal  bowel sounds. No rebound or guarding.  Ext: no edema Skin: warm, dry Neuro: speech normal, moves all extremities  Assessment/Plan:  Hyperlipidemia LDL goal <100 S: poorly controlled on simvastatin 20mg . Now on atorvastatin 20mg - but taking 2 tablets . She is down a few lbs despite being at the beach last week.  Lab Results  Component Value Date   CHOL 184 04/03/2018   HDL 50.70 04/03/2018   LDLCALC 122 (H) 04/03/2018   LDLDIRECT 156.9 11/07/2013   TRIG 58.0 04/03/2018   CHOLHDL 4 04/03/2018   A/P: update LDL- hoping under 100 at least  GERD (gastroesophageal reflux disease) S:  remains on prilosec for GERD particularly in light of history of GI bleed with Hgb down to 6 in 2002 A/P: continue current meds. b12 low normal- she started b12 1000 mcg- will update b12 due to high risk meds   Essential hypertension S: controlled on atenolol-chlorthalidone 50-25mg  daily- now on full tab. Takes potassium due to diuretic BP Readings from Last 3 Encounters:  07/23/18 130/72  04/26/18 134/80  04/03/18 134/86  A/P: We discussed blood pressure goal of <140/90. Continue current meds:    Future Appointments  Date Time Provider Department Center  10/03/2018  9:00 AM Shelva Majestic, MD [facility]  05/02/2019  9:00 AM [facility]ALTH COACH [facility]   Lab/Order associations: High risk medication use - Plan: Vitamin B12  Essential hypertension - Plan:  atenolol-chlorthalidone (TENORETIC) 50-25 MG tablet, LDL cholesterol, direct, Comprehensive metabolic panel  Hyperlipidemia LDL goal <100 - Plan: LDL cholesterol, direct, Comprehensive metabolic panel  Gastroesophageal reflux disease without esophagitis  Meds ordered this encounter  Medications  . atenolol-chlorthalidone (TENORETIC) 50-25 MG tablet    Sig: Take 1 tablet by mouth daily.    Dispense:  90 tablet    Refill:  3  . fluticasone (FLONASE) 50 MCG/ACT nasal spray    Sig: Place 2 sprays into both nostrils daily.     Dispense:  48 g    Refill:  3  . atorvastatin (LIPITOR) 40 MG tablet    Sig: Take 1 tablet (40 mg total) by mouth daily.    Dispense:  90 tablet    Refill:  3   Return precautions advised.  Tana Conch, MD

## 2018-07-23 NOTE — Assessment & Plan Note (Signed)
S:  remains on prilosec for GERD particularly in light of history of GI bleed with Hgb down to 6 in 2002 A/P: continue current meds. b12 low normal- she started b12 1000 mcg- will update b12 due to high risk meds

## 2018-07-23 NOTE — Patient Instructions (Signed)
We have a visit in October scheduled for some reason- please cancel that visit and schedule a visit for 6 months from today  Please stop by lab before you go

## 2018-08-23 ENCOUNTER — Encounter: Payer: Self-pay | Admitting: Family Medicine

## 2018-08-23 ENCOUNTER — Ambulatory Visit (INDEPENDENT_AMBULATORY_CARE_PROVIDER_SITE_OTHER): Payer: Medicare Other

## 2018-08-23 DIAGNOSIS — Z23 Encounter for immunization: Secondary | ICD-10-CM

## 2018-09-12 DIAGNOSIS — Z1231 Encounter for screening mammogram for malignant neoplasm of breast: Secondary | ICD-10-CM | POA: Diagnosis not present

## 2018-09-12 LAB — HM MAMMOGRAPHY

## 2018-09-14 ENCOUNTER — Encounter: Payer: Self-pay | Admitting: Family Medicine

## 2018-10-01 DIAGNOSIS — H2513 Age-related nuclear cataract, bilateral: Secondary | ICD-10-CM | POA: Diagnosis not present

## 2018-10-03 ENCOUNTER — Ambulatory Visit: Payer: Medicare Other | Admitting: Family Medicine

## 2018-10-16 DIAGNOSIS — M8589 Other specified disorders of bone density and structure, multiple sites: Secondary | ICD-10-CM | POA: Diagnosis not present

## 2018-10-16 DIAGNOSIS — Z96653 Presence of artificial knee joint, bilateral: Secondary | ICD-10-CM | POA: Diagnosis not present

## 2018-10-16 LAB — HM DEXA SCAN

## 2018-10-19 ENCOUNTER — Telehealth: Payer: Self-pay | Admitting: Family Medicine

## 2018-10-19 ENCOUNTER — Encounter: Payer: Self-pay | Admitting: Family Medicine

## 2018-10-19 NOTE — Telephone Encounter (Signed)
Patient with osteopenia still on bone density. Some areas have improved and some have worsened. We should repeat test in 2 years. Continue vitamin D at least [phone] units a day. Continue to try to get calcium 1200mg  a day- some can be through pills and some can be through diet.

## 2018-10-22 ENCOUNTER — Encounter: Payer: Self-pay | Admitting: Family Medicine

## 2018-10-24 NOTE — Telephone Encounter (Signed)
Called patient and left a detailed voicemail message letting patient know what Dr. Durene Cal had recommended. I also mailed a Calcium handout to patient. I provided a call back number for questions

## 2018-11-09 ENCOUNTER — Other Ambulatory Visit: Payer: Self-pay

## 2018-11-09 DIAGNOSIS — K219 Gastro-esophageal reflux disease without esophagitis: Secondary | ICD-10-CM

## 2018-11-09 MED ORDER — OMEPRAZOLE 20 MG PO CPDR: 20.0000 mg | DELAYED_RELEASE_CAPSULE | Freq: Every day | ORAL | 1 refills | Status: DC

## 2018-11-17 ENCOUNTER — Other Ambulatory Visit: Payer: Self-pay | Admitting: Family Medicine

## 2018-11-17 DIAGNOSIS — E876 Hypokalemia: Secondary | ICD-10-CM

## 2019-01-23 ENCOUNTER — Ambulatory Visit: Payer: Medicare Other | Admitting: Family Medicine

## 2019-02-11 ENCOUNTER — Encounter: Payer: Self-pay | Admitting: Family Medicine

## 2019-02-11 ENCOUNTER — Ambulatory Visit (INDEPENDENT_AMBULATORY_CARE_PROVIDER_SITE_OTHER): Payer: Medicare Other | Admitting: Family Medicine

## 2019-02-11 VITALS — BP 130/68 | HR 73 | Temp 98.3°F | Ht 61.0 in | Wt 150.6 lb

## 2019-02-11 DIAGNOSIS — Z8719 Personal history of other diseases of the digestive system: Secondary | ICD-10-CM

## 2019-02-11 DIAGNOSIS — I10 Essential (primary) hypertension: Secondary | ICD-10-CM | POA: Diagnosis not present

## 2019-02-11 DIAGNOSIS — K219 Gastro-esophageal reflux disease without esophagitis: Secondary | ICD-10-CM | POA: Diagnosis not present

## 2019-02-11 DIAGNOSIS — E785 Hyperlipidemia, unspecified: Secondary | ICD-10-CM

## 2019-02-11 LAB — COMPREHENSIVE METABOLIC PANEL
ALT: 12 U/L (ref 0–35)
AST: 18 U/L (ref 0–37)
Albumin: 3.8 g/dL (ref 3.5–5.2)
Alkaline Phosphatase: 114 U/L (ref 39–117)
BILIRUBIN TOTAL: 0.3 mg/dL (ref 0.2–1.2)
BUN: 25 mg/dL — ABNORMAL HIGH (ref 6–23)
CALCIUM: 9.5 mg/dL (ref 8.4–10.5)
CHLORIDE: 104 meq/L (ref 96–112)
CO2: 29 meq/L (ref 19–32)
Creatinine, Ser: 1.5 mg/dL — ABNORMAL HIGH (ref 0.40–1.20)
GFR: 40.53 mL/min — AB (ref >60.00)
Glucose, Bld: 108 mg/dL — ABNORMAL HIGH (ref 70–99)
POTASSIUM: 4.5 meq/L (ref 3.5–5.1)
Sodium: 141 mEq/L (ref 135–145)
Total Protein: 6.6 g/dL (ref 6.0–8.3)

## 2019-02-11 NOTE — Patient Instructions (Addendum)
If trigger finger gets worse even with bracing- let me know and we will refer you to Dr. Berline Chough our sports medicine doctor    Please stop by lab before you go If you do not have mychart- we will call you about results within 5 business days of [place] receiving them.  If you have mychart- we will send your results within 3 business days of [place] receiving them.  If abnormal or we want to clarify a result, we will call or mychart you to make sure you receive the message.  If you have questions or concerns or don't hear within 5-7 days, please send [place] a message or call us.   PHQ 2 done

## 2019-02-11 NOTE — Progress Notes (Signed)
Phone [phone]   Subjective:  Maria [name] is a 78 y.o. year old very pleasant female patient who presents for/with See problem oriented charting ROS-   No chest pain or shortness of breath. No headache or blurry vision.  No melena or BRBPR.   Past Medical History-  Patient Active Problem List   Diagnosis Date Noted  . History of gastrointestinal hemorrhage     Priority: High  . GERD (gastroesophageal reflux disease) 05/27/2014    Priority: Medium  . Hyperlipidemia LDL goal <100 08/21/2008    Priority: Medium  . Essential hypertension 08/21/2008    Priority: Medium  . 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 Osteoarthritis of right knee 06/03/2012    Priority: Low    Medications- reviewed and updated Current Outpatient Medications  Medication Sig Dispense Refill  . atenolol-chlorthalidone (TENORETIC) 50-25 MG tablet Take 1 tablet by mouth daily. 90 tablet 3  . atorvastatin (LIPITOR) 40 MG tablet Take 1 tablet (40 mg total) by mouth daily. 90 tablet 3  . Calcium Carbonate-Vitamin D (CALCIUM PLUS VITAMIN D PO) Take 2.5 tablets by mouth daily.     . fluticasone (FLONASE) 50 MCG/ACT nasal spray Place 2 sprays into both nostrils daily. 48 g 3  . guaiFENesin (MUCINEX) 600 MG 12 hr tablet Take 600 mg by mouth 2 (two) times daily.    Marland Kitchen omeprazole (PRILOSEC) 20 MG capsule Take 1 capsule (20 mg total) by mouth daily. 90 capsule 1  . potassium chloride SA (K-DUR,KLOR-CON) 20 MEQ tablet TAKE 1 TABLET BY MOUTH TWICE A DAY 180 tablet 1   No current facility-administered medications for this visit.      Objective:  BP 130/68 (BP Location: Right Arm, Patient Position: Sitting, Cuff Size: Normal)   Pulse 73   Temp 98.3 F (36.8 C) (Oral)   Ht 5\' 1"  (1.549 m)   Wt 150 lb 9.6 oz (68.3 kg)   SpO2 95%   BMI 28.46 kg/m  Gen: NAD, resting comfortably CV: RRR no murmurs rubs or gallops Lungs: CTAB no crackles, wheeze, rhonchi Abdomen:  soft/nontender/nondistended Ext: no edema Skin: warm, dry Neuro: gait and speech normal    Assessment and Plan    #Essential hypertension S: Compliant with atenolol-chlorthalidone 50-25 mg (takng whole tablet- had been running higher on half).  Also on potassium. A/P:   Stable. Continue current medications.   -Update CMP today due to mild decrease in GFR and increase in alkaline phosphatase last visit. - also congratulated on 1 lb weight loss than last visit  #Hyperlipidemia S: Compliant with atorvastatin 40 mg.  LDL goal at least under 100.  Last LDL was right at 100 A/P:  Suspect stable problem-we will plan on full lipid panel next visit.  Continue atorvastatin  #GERD S: Compliant with omeprazole 20 mg.  B12 last checked August 2019.  She reports symptoms remain well controlled -history of severe GI bleed years ago so keeping her on PPI A/P:   Stable. Continue current medications.     Left 4th finger trigger finger- gave a makeshift splint for her to use just to make sure she triggers this    No problem-specific Assessment & Plan notes found for this encounter.   Future Appointments  Date Time Provider Department Center  05/02/2019  9:00 AM [facility]   No follow-ups on file.  Lab/Order associations: Essential hypertension - Plan: Comprehensive metabolic panel  Hyperlipidemia LDL goal <100 - Plan: Comprehensive metabolic  panel  Gastroesophageal reflux disease without esophagitis  History of gastrointestinal hemorrhage  No orders of the defined types were placed in this encounter.   Return precautions advised.  Tana Conch, MD

## 2019-02-12 ENCOUNTER — Other Ambulatory Visit: Payer: Self-pay

## 2019-02-12 DIAGNOSIS — R7989 Other specified abnormal findings of blood chemistry: Secondary | ICD-10-CM

## 2019-03-06 ENCOUNTER — Ambulatory Visit: Payer: Medicare Other | Admitting: Family Medicine

## 2019-05-02 ENCOUNTER — Ambulatory Visit: Payer: Medicare Other

## 2019-05-09 ENCOUNTER — Other Ambulatory Visit: Payer: Self-pay | Admitting: Family Medicine

## 2019-05-09 DIAGNOSIS — K219 Gastro-esophageal reflux disease without esophagitis: Secondary | ICD-10-CM

## 2019-05-25 ENCOUNTER — Other Ambulatory Visit: Payer: Self-pay | Admitting: Family Medicine

## 2019-05-25 DIAGNOSIS — E876 Hypokalemia: Secondary | ICD-10-CM

## 2019-05-25 DIAGNOSIS — I10 Essential (primary) hypertension: Secondary | ICD-10-CM

## 2019-07-04 ENCOUNTER — Telehealth: Payer: Self-pay | Admitting: Family Medicine

## 2019-07-04 NOTE — Telephone Encounter (Signed)
I left a message asking the patient to call me at [phone] to schedule AWV with Loma Sousa. Last AWV 04/26/18 so patient can be scheduled at next available opening. VDM (Dee-Dee)

## 2019-07-17 ENCOUNTER — Ambulatory Visit (INDEPENDENT_AMBULATORY_CARE_PROVIDER_SITE_OTHER): Payer: Medicare Other

## 2019-07-17 ENCOUNTER — Other Ambulatory Visit: Payer: Self-pay

## 2019-07-17 DIAGNOSIS — Z0000 Encounter for general adult medical examination without abnormal findings: Secondary | ICD-10-CM | POA: Diagnosis not present

## 2019-07-17 NOTE — Progress Notes (Signed)
I have reviewed and agree with note, evaluation, plan.   Stephen Hunter, MD  

## 2019-07-17 NOTE — Patient Instructions (Signed)
Maria [name] , Thank you for taking time to come for your Medicare Wellness Visit. I appreciate your ongoing commitment to your health goals. Please review the following plan we discussed and let me know if I can assist you in the future.   Screening recommendations/referrals: Colorectal Screening: not indicated  Mammogram: last 09/12/18 Bone Density: last 10/16/18; due 10/2020  Vision and Dental Exams: Recommended annual ophthalmology exams for early detection of glaucoma and other disorders of the eye Recommended annual dental exams for proper oral hygiene  Vaccinations: Influenza vaccine:  recommended this fall either at PCP office or through your local pharmacy  Pneumococcal vaccine: completed  Tdap vaccine: 10/29/12 Shingles vaccine: Please call your insurance company to determine your out of pocket expense for the Shingrix vaccine. You may receive this vaccine at your local pharmacy.  Advanced directives: Please bring a copy of your POA (Power of Attorney) and/or Living Will to your next appointment.  Goals: Recommend to exercise for at least 150 minutes per week.  Next appointment: Please schedule your Annual Wellness Visit with your Nurse Health Advisor in one year.  Preventive Care 23 Years and Older, Female Preventive care refers to lifestyle choices and visits with your health care provider that can promote health and wellness. What does preventive care include?  A yearly physical exam. This is also called an annual well check.  Dental exams once or twice a year.  Routine eye exams. Ask your health care provider how often you should have your eyes checked.  Personal lifestyle choices, including:  Daily care of your teeth and gums.  Regular physical activity.  Eating a healthy diet.  Avoiding tobacco and drug use.  Limiting alcohol use.  Practicing safe sex.  Taking low-dose aspirin every day if recommended by your health care provider.  Taking vitamin and  mineral supplements as recommended by your health care provider. What happens during an annual well check? The services and screenings done by your health care provider during your annual well check will depend on your age, overall health, lifestyle risk factors, and family history of disease. Counseling  Your health care provider may ask you questions about your:  Alcohol use.  Tobacco use.  Drug use.  Emotional well-being.  Home and relationship well-being.  Sexual activity.  Eating habits.  History of falls.  Memory and ability to understand (cognition).  Work and work Statistician.  Reproductive health. Screening  You may have the following tests or measurements:  Height, weight, and BMI.  Blood pressure.  Lipid and cholesterol levels. These may be checked every 5 years, or more frequently if you are over 19 years old.  Skin check.  Lung cancer screening. You may have this screening every year starting at age 71 if you have a 30-pack-year history of smoking and currently smoke or have quit within the past 15 years.  Fecal occult blood test (FOBT) of the stool. You may have this test every year starting at age 62.  Flexible sigmoidoscopy or colonoscopy. You may have a sigmoidoscopy every 5 years or a colonoscopy every 10 years starting at age 19.  Hepatitis C blood test.  Hepatitis B blood test.  Sexually transmitted disease (STD) testing.  Diabetes screening. This is done by checking your blood sugar (glucose) after you have not eaten for a while (fasting). You may have this done every 1-3 years.  Bone density scan. This is done to screen for osteoporosis. You may have this done starting at age 90.  Mammogram. This may be done every 1-2 years. Talk to your health care provider about how often you should have regular mammograms. Talk with your health care provider about your test results, treatment options, and if necessary, the need for more tests. Vaccines   Your health care provider may recommend certain vaccines, such as:  Influenza vaccine. This is recommended every year.  Tetanus, diphtheria, and acellular pertussis (Tdap, Td) vaccine. You may need a Td booster every 10 years.  Zoster vaccine. You may need this after age 61.  Pneumococcal 13-valent conjugate (PCV13) vaccine. One dose is recommended after age 45.  Pneumococcal polysaccharide (PPSV23) vaccine. One dose is recommended after age 18. Talk to your health care provider about which screenings and vaccines you need and how often you need them. This information is not intended to replace advice given to you by your health care provider. Make sure you discuss any questions you have with your health care provider. Document Released: 12/18/2015 Document Revised: 08/10/2016 Document Reviewed: 09/22/2015 Elsevier Interactive Patient Education  2017 [employer] Prevention in the Home Falls can cause injuries. They can happen to people of all ages. There are many things you can do to make your home safe and to help prevent falls. What can I do on the outside of my home?  Regularly fix the edges of walkways and driveways and fix any cracks.  Remove anything that might make you trip as you walk through a door, such as a raised step or threshold.  Trim any bushes or trees on the path to your home.  Use bright outdoor lighting.  Clear any walking paths of anything that might make someone trip, such as rocks or tools.  Regularly check to see if handrails are loose or broken. Make sure that both sides of any steps have handrails.  Any raised decks and porches should have guardrails on the edges.  Have any leaves, snow, or ice cleared regularly.  Use sand or salt on walking paths during winter.  Clean up any spills in your garage right away. This includes oil or grease spills. What can I do in the bathroom?  Use night lights.  Install grab bars by the toilet and in the  tub and shower. Do not use towel bars as grab bars.  Use non-skid mats or decals in the tub or shower.  If you need to sit down in the shower, use a plastic, non-slip stool.  Keep the floor dry. Clean up any water that spills on the floor as soon as it happens.  Remove soap buildup in the tub or shower regularly.  Attach bath mats securely with double-sided non-slip rug tape.  Do not have throw rugs and other things on the floor that can make you trip. What can I do in the bedroom?  Use night lights.  Make sure that you have a light by your bed that is easy to reach.  Do not use any sheets or blankets that are too big for your bed. They should not hang down onto the floor.  Have a firm chair that has side arms. You can use this for support while you get dressed.  Do not have throw rugs and other things on the floor that can make you trip. What can I do in the kitchen?  Clean up any spills right away.  Avoid walking on wet floors.  Keep items that you use a lot in easy-to-reach places.  If you need to reach  something above you, use a strong step stool that has a grab bar.  Keep electrical cords out of the way.  Do not use floor polish or wax that makes floors slippery. If you must use wax, use non-skid floor wax.  Do not have throw rugs and other things on the floor that can make you trip. What can I do with my stairs?  Do not leave any items on the stairs.  Make sure that there are handrails on both sides of the stairs and use them. Fix handrails that are broken or loose. Make sure that handrails are as long as the stairways.  Check any carpeting to make sure that it is firmly attached to the stairs. Fix any carpet that is loose or worn.  Avoid having throw rugs at the top or bottom of the stairs. If you do have throw rugs, attach them to the floor with carpet tape.  Make sure that you have a light switch at the top of the stairs and the bottom of the stairs. If you  do not have them, ask someone to add them for you. What else can I do to help prevent falls?  Wear shoes that:  Do not have high heels.  Have rubber bottoms.  Are comfortable and fit you well.  Are closed at the toe. Do not wear sandals.  If you use a stepladder:  Make sure that it is fully opened. Do not climb a closed stepladder.  Make sure that both sides of the stepladder are locked into place.  Ask someone to hold it for you, if possible.  Clearly mark and make sure that you can see:  Any grab bars or handrails.  First and last steps.  Where the edge of each step is.  Use tools that help you move around (mobility aids) if they are needed. These include:  Canes.  Walkers.  Scooters.  Crutches.  Turn on the lights when you go into a dark area. Replace any light bulbs as soon as they burn out.  Set up your furniture so you have a clear path. Avoid moving your furniture around.  If any of your floors are uneven, fix them.  If there are any pets around you, be aware of where they are.  Review your medicines with your doctor. Some medicines can make you feel dizzy. This can increase your chance of falling. Ask your doctor what other things that you can do to help prevent falls. This information is not intended to replace advice given to you by your health care provider. Make sure you discuss any questions you have with your health care provider. Document Released: 09/17/2009 Document Revised: 04/28/2016 Document Reviewed: 12/26/2014 Elsevier Interactive Patient Education  2017 [employer].

## 2019-07-17 NOTE — Progress Notes (Addendum)
This visit is being conducted via phone call due to the COVID-19 pandemic. This patient has given me verbal consent via phone to conduct this visit, patient states they are participating from their home address. Some vital signs may be absent or patient reported.   Patient identification: identified by name, DOB, and current address.   Subjective:   Maria [name] is a 78 y.o. female who presents for Medicare Annual (Subsequent) preventive examination.  Review of Systems:   Cardiac Risk Factors include: hypertension;dyslipidemia     Objective:     Vitals: There were no vitals taken for this visit.  There is no height or weight on file to calculate BMI.  Advanced Directives 07/17/2019 04/26/2018 10/13/2016 05/06/2014 05/22/2012  Does Patient Have a Medical Advance Directive? Yes Yes Yes Patient has advance directive, copy not in chart Patient does not have advance directive  Type of Advance Directive [place];Living will - - Living will -  Does patient want to make changes to medical advance directive? No - Patient declined - - - -  Copy of [place] in Chart? No - copy requested - No - copy requested - -  Pre-existing out of facility DNR order (yellow form or pink MOST form) - - - No -    Tobacco Social History   Tobacco Use  Smoking Status Never Smoker  Smokeless Tobacco Never Used      Clinical Intake:  Pre-visit preparation completed: Yes  Pain : No/denies pain  Diabetes: No  How often do you need to have someone help you when you read instructions, pamphlets, or other written materials from your doctor or pharmacy?: 1 - Never  Interpreter Needed?: No  Information entered by :: Denman George LPN  Past Medical History:  Diagnosis Date   Arthritis    Bronchitis    hx of   GERD (gastroesophageal reflux disease)    History of bleeding ulcers    History of gastrointestinal hemorrhage    now off aspirin    Hyperlipidemia    Hypertension    Osteoarthritis of right knee 06/03/2012   Past Surgical History:  Procedure Laterality Date   ABDOMINAL HYSTERECTOMY     nonmalignant reasons   APPENDECTOMY     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   Procedure: LEFT TOTAL KNEE ARTHROPLASTY;  Surgeon: Ninetta Lights, MD;  Location: [facility];  Service: Orthopedics;  Laterality: Left;   Family History  Problem Relation Age of Onset   Congestive Heart Failure Mother        3, only child   Other Father        was not in patient life   Social History   Socioeconomic History   Marital status: Divorced    Spouse name: Not on file   Number of children: Not on file   Years of education: Not on file   Highest education level: Not on file  Occupational History   Not on file  Social Needs   Financial resource strain: Not on file   Food insecurity    Worry: Not on file    Inability: Not on file   Transportation needs    Medical: Not on file    Non-medical: Not on file  Tobacco Use   Smoking status: Never Smoker   Smokeless tobacco: Never Used  Substance and Sexual Activity   Alcohol use: No   Drug use: No   Sexual activity: Not on file  Lifestyle   Physical activity    Days per week: Not on file    Minutes per session: Not on file   Stress: Not on file  Relationships   Social connections    Talks on phone: Not on file    Gets together: Not on file    Attends religious service: Not on file    Active member of club or organization: Not on file    Attends meetings of clubs or organizations: Not on file    Relationship status: Not on file  Other Topics Concern   Not on file  Social History Narrative   Lives with graddaughter and 14 year old son (was  living alone before)- she is on habitat list   Divorced 1977.       Still doing childcare   Retired from [employer] Encounter Medications as of 07/17/2019  Medication Sig   atenolol-chlorthalidone (TENORETIC) 50-25 MG tablet TAKE 1 TABLET BY MOUTH DAILY   atorvastatin (LIPITOR) 40 MG tablet Take 1 tablet (40 mg total) by mouth daily.   Calcium Carbonate-Vitamin D (CALCIUM PLUS VITAMIN D PO) Take 2.5 tablets by mouth daily.    fluticasone (FLONASE) 50 MCG/ACT nasal spray Place 2 sprays into both nostrils daily.   guaiFENesin (MUCINEX) 600 MG 12 hr tablet Take 600 mg by mouth 2 (two) times daily.   omeprazole (PRILOSEC) 20 MG capsule TAKE 1 CAPSULE(20 MG) BY MOUTH DAILY   potassium chloride SA (K-DUR) 20 MEQ tablet TAKE 1 TABLET BY MOUTH TWICE DAILY   No facility-administered encounter medications on file as of 07/17/2019.     Activities of Daily Living In your present state of health, do you have any difficulty performing the following activities: 07/17/2019  Hearing? N  Vision? N  Difficulty concentrating or making decisions? N  Walking or climbing stairs? N  Dressing or bathing? N  Doing errands, shopping? N  Preparing Food and eating ? N  Using the Toilet? N  In the past six months, have you accidently leaked urine? N  Do you have problems with loss of bowel control? N  Managing your Medications? N  Managing your Finances? N  Housekeeping or managing your Housekeeping? N  Some recent data might be hidden    Patient Care Team: Shelva Majestic, MD as PCP - General (Family Medicine) Augustin Schooling, MD as Consulting Physician (Ophthalmology)    Assessment:   This is a routine wellness examination for Maria [name].  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;  Line dancing one day Pick up 2 days of walking Replace one meal with shake  Fat free or low fat dairy products Fish high in omega-3 acids ( salmon, tuna, trout) Fruits, such as apples, bananas, oranges, pears, prunes Legumes, such as kidney beans, lentils, checkpeas, black-eyed peas and lima beans Vegetables; broccoli, cabbage, carrots Whole grains;   Plant fats are  better; decrease "white" foods as pasta, rice, bread and desserts, sugar; Avoid red meat (limiting) palm and coconut oils; sugary foods and beverages  Two nutrients that raise blood chol levels are saturated fats and trans fat; in hydrogenated oils and fats, as stick margarine, baked goods (cookes, cakes, pies, crackers; frosting; and coffee creamers;   Some Fats lower cholesterol: Monounsaturated and polyunsaturated  Avocados Corn, sunflower, and soybean oils Nuts and seeds, such as walnuts Olive, canola, peanut, safflower, and sesame oils Peanut butter Salmon and trout Tofu          Fall Risk Fall Risk  07/17/2019 04/26/2018 04/03/2018 10/13/2016 03/29/2016  Falls in the past year? 0 No No Yes No  Number falls in past yr: 0 - - 1 -  Injury with Fall? 0 - - - -  Follow up - - - Education provided -  Comment - - - resources to assist w putting handrals up in bathroom -    Depression Screen PHQ 2/9 Scores 07/17/2019 02/11/2019 04/26/2018 04/03/2018  PHQ - 2 Score 0 0 0 0     Cognitive Function MMSE - Mini Mental State Exam 04/26/2018 10/13/2016  Not completed: (No Data) (No Data)     6CIT Screen 07/17/2019 04/26/2018  What Year? 0 points 0 points  What month? 0 points 0 points  What time? 0 points 0 points  Count back from 20 0 points 0 points  Months in reverse 0 points 0 points  Repeat phrase 0 points 0 points  Total Score 0 0    Immunization History  Administered Date(s) Administered   Influenza Split  09/08/2011, 10/29/2012   Influenza Whole 08/21/2008, 09/06/2010   Influenza, High Dose Seasonal PF 09/29/2016, 08/23/2018   Influenza,inj,Quad PF,6+ Mos 11/07/2013   PPD Test 03/24/2017   Pneumococcal Conjugate-13 11/07/2013   Pneumococcal Polysaccharide-23 12/05/2005, 09/08/2011   Td 12/05/2001   Tdap 10/29/2012   Zoster 08/21/2008    Qualifies for Shingles Vaccine? Shingrix discussed; patient will check with pharmacy for coverage.  Education handout provided   Screening Tests Health Maintenance  Topic Date Due   INFLUENZA VACCINE  07/06/2019   MAMMOGRAM  09/13/2019   TETANUS/TDAP  10/29/2022   DEXA SCAN  Completed   PNA vac Low Risk Adult  Completed    Cancer Screenings: Breast:  Up to date on Mammogram? Yes  (last 10/16/18)   Up to date of Bone Density/Dexa? Yes (last 10/16/18) Colorectal: not indicated    Plan:    I have personally reviewed and addressed the Medicare Annual Wellness questionnaire and have noted the following in the patients chart:  A. Medical and social history B. Use of alcohol, tobacco or illicit drugs  C. Current medications and supplements D. Functional ability and status E.  Nutritional status F.  Physical activity G. Advance directives H. List of other physicians I.  Hospitalizations, surgeries, and ER visits in previous 12 months J.  Vitals K. Screenings such as hearing and vision if needed, cognitive and depression L. Referrals, records requested, and appointments- none (patient will self schedule yearly mammogram)   In addition, I have reviewed and discussed with patient certain preventive protocols, quality metrics, and best practice recommendations. A written personalized care plan for preventive services as well as general preventive health recommendations were provided to patient.   Signed,  Kandis Fantasiaourtney Tyeisha Dinan, LPN  Nurse Health Advisor   Nurse Notes:

## 2019-07-24 NOTE — Progress Notes (Signed)
Covid tele health disclosure added

## 2019-08-20 ENCOUNTER — Ambulatory Visit: Payer: Medicare Other | Admitting: Family Medicine

## 2019-08-27 ENCOUNTER — Other Ambulatory Visit: Payer: Self-pay | Admitting: Family Medicine

## 2019-09-11 ENCOUNTER — Other Ambulatory Visit: Payer: Self-pay | Admitting: Family Medicine

## 2019-09-11 DIAGNOSIS — K219 Gastro-esophageal reflux disease without esophagitis: Secondary | ICD-10-CM

## 2019-09-16 DIAGNOSIS — Z1231 Encounter for screening mammogram for malignant neoplasm of breast: Secondary | ICD-10-CM | POA: Diagnosis not present

## 2019-09-16 DIAGNOSIS — Z803 Family history of malignant neoplasm of breast: Secondary | ICD-10-CM | POA: Diagnosis not present

## 2019-09-25 DIAGNOSIS — M545 Low back pain: Secondary | ICD-10-CM | POA: Diagnosis not present

## 2019-09-25 DIAGNOSIS — M7061 Trochanteric bursitis, right hip: Secondary | ICD-10-CM | POA: Diagnosis not present

## 2019-09-25 DIAGNOSIS — M1611 Unilateral primary osteoarthritis, right hip: Secondary | ICD-10-CM | POA: Diagnosis not present

## 2019-09-25 LAB — HM MAMMOGRAPHY

## 2019-09-25 NOTE — Progress Notes (Signed)
Patient thought her visit was in the afternoon and therefore missed visit-should not be charged for no-show-we reschedule visit

## 2019-09-25 NOTE — Patient Instructions (Signed)
Health Maintenance Due  Topic Date Due  . INFLUENZA VACCINE  07/06/2019  . MAMMOGRAM  09/13/2019   Depression screen [facility] 2/9 07/17/2019 02/11/2019 04/26/2018  Decreased Interest 0 0 0  Down, Depressed, Hopeless 0 0 0  PHQ - 2 Score 0 0 0

## 2019-09-30 ENCOUNTER — Ambulatory Visit: Payer: Medicare Other | Admitting: Family Medicine

## 2019-09-30 DIAGNOSIS — H02834 Dermatochalasis of left upper eyelid: Secondary | ICD-10-CM | POA: Diagnosis not present

## 2019-09-30 DIAGNOSIS — H25043 Posterior subcapsular polar age-related cataract, bilateral: Secondary | ICD-10-CM | POA: Diagnosis not present

## 2019-09-30 DIAGNOSIS — H2513 Age-related nuclear cataract, bilateral: Secondary | ICD-10-CM | POA: Diagnosis not present

## 2019-09-30 DIAGNOSIS — H25011 Cortical age-related cataract, right eye: Secondary | ICD-10-CM | POA: Diagnosis not present

## 2019-10-02 DIAGNOSIS — M7061 Trochanteric bursitis, right hip: Secondary | ICD-10-CM | POA: Diagnosis not present

## 2019-10-02 NOTE — Patient Instructions (Addendum)
Health Maintenance Due  Topic Date Due  . INFLUENZA VACCINE  Will get today  07/06/2019   Try xyzal before bed - can pick up at pharmacy. Please continue the flonase.   Please stop by lab before you go If you do not have mychart- we will call you about results within 5 business days of [place] receiving them.  If you have mychart- we will send your results within 3 business days of [place] receiving them.  If abnormal or we want to clarify a result, we will call or mychart you to make sure you receive the message.  If you have questions or concerns or don't hear within 5-7 days, please send [place] a message or call us.    Great to see you today!   Influenza (Flu) Vaccine (Inactivated or Recombinant): What You Need to Know 1. Why get vaccinated? Influenza vaccine can prevent influenza (flu). Flu is a contagious disease that spreads around the [place] every year, usually between October and May. Anyone can get the flu, but it is more dangerous for some people. Infants and young children, people 54 years of age and older, pregnant women, and people with certain health conditions or a weakened immune system are at greatest risk of flu complications. Pneumonia, bronchitis, sinus infections and ear infections are examples of flu-related complications. If you have a medical condition, such as heart disease, cancer or diabetes, flu can make it worse. Flu can cause fever and chills, sore throat, muscle aches, fatigue, cough, headache, and runny or stuffy nose. Some people may have vomiting and diarrhea, though this is more common in children than adults. Each year thousands of people in the [place] die from flu, and many more are hospitalized. Flu vaccine prevents millions of illnesses and flu-related visits to the doctor each year. 2. Influenza vaccine CDC recommends everyone 47 months of age and older get vaccinated every flu season. Children 6 months through 37 years of age may need 2 doses during a  single flu season. Everyone else needs only 1 dose each flu season. It takes about 2 weeks for protection to develop after vaccination. There are many flu viruses, and they are always changing. Each year a new flu vaccine is made to protect against three or four viruses that are likely to cause disease in the upcoming flu season. Even when the vaccine doesn't exactly match these viruses, it may still provide some protection. Influenza vaccine does not cause flu. Influenza vaccine may be given at the same time as other vaccines. 3. Talk with your health care provider Tell your vaccine provider if the person getting the vaccine:  Has had an allergic reaction after a previous dose of influenza vaccine, or has any severe, life-threatening allergies.  Has ever had Guillain-Barr Syndrome (also called GBS). In some cases, your health care provider may decide to postpone influenza vaccination to a future visit. People with minor illnesses, such as a cold, may be vaccinated. People who are moderately or severely ill should usually wait until they recover before getting influenza vaccine. Your health care provider can give you more information. 4. Risks of a vaccine reaction  Soreness, redness, and swelling where shot is given, fever, muscle aches, and headache can happen after influenza vaccine.  There may be a very small increased risk of Guillain-Barr Syndrome (GBS) after inactivated influenza vaccine (the flu shot). Young children who get the flu shot along with pneumococcal vaccine (PCV13), and/or DTaP vaccine at the same time might  be slightly more likely to have a seizure caused by fever. Tell your health care provider if a child who is getting flu vaccine has ever had a seizure. People sometimes faint after medical procedures, including vaccination. Tell your provider if you feel dizzy or have vision changes or ringing in the ears. As with any medicine, there is a very remote chance of a vaccine  causing a severe allergic reaction, other serious injury, or death. 5. What if there is a serious problem? An allergic reaction could occur after the vaccinated person leaves the clinic. If you see signs of a severe allergic reaction (hives, swelling of the face and throat, difficulty breathing, a fast heartbeat, dizziness, or weakness), call 9-1-1 and get the person to the nearest hospital. For other signs that concern you, call your health care provider. Adverse reactions should be reported to the Vaccine Adverse Event Reporting System (VAERS). Your health care provider will usually file this report, or you can do it yourself. Visit the VAERS website at www.vaers.LAgents.no or call 989-421-9339.VAERS is only for reporting reactions, and VAERS staff do not give medical advice. 6. The National Vaccine Injury Compensation Program The [employer] Vaccine Injury Compensation Program (VICP) is a federal program that was created to compensate people who may have been injured by certain vaccines. Visit the VICP website at SpiritualWord.at or call 412-653-5193 to learn about the program and about filing a claim. There is a time limit to file a claim for compensation. 7. How can I learn more?  Ask your healthcare provider.  Call your local or state health department.  Contact the Centers for Disease Control and Prevention (CDC): ? Call (412) 629-8976 (1-800-CDC-INFO) or ? Visit CDC's BiotechRoom.com.cy Vaccine Information Statement (Interim) Inactivated Influenza Vaccine (07/19/2018) This information is not intended to replace advice given to you by your health care provider. Make sure you discuss any questions you have with your health care provider. Document Released: 09/15/2006 Document Revised: 03/12/2019 Document Reviewed: 07/23/2018 Elsevier Patient Education  2020 [employer].

## 2019-10-02 NOTE — Progress Notes (Signed)
Phone: [phone]   Subjective:  Patient presents today for their annual physical. Chief complaint-noted.   See problem oriented charting- ROS- full  review of systems was completed and negative except for: post nasal drip/cough  The following were reviewed and entered/updated in epic: Past Medical History:  Diagnosis Date  . Arthritis   . Bronchitis    hx of  . GERD (gastroesophageal reflux disease)   . History of bleeding ulcers   . History of gastrointestinal hemorrhage    now off aspirin  . Hyperlipidemia   . Hypertension   . Osteoarthritis of right knee 06/03/2012   Patient Active Problem List   Diagnosis Date Noted  . History of gastrointestinal hemorrhage     Priority: High  . GERD (gastroesophageal reflux disease) 05/27/2014    Priority: Medium  . Hyperlipidemia LDL goal <100 08/21/2008    Priority: Medium  . Essential hypertension 08/21/2008    Priority: Medium  . 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 Osteoarthritis of right knee 06/03/2012    Priority: Low   Past Surgical History:  Procedure Laterality Date  . ABDOMINAL HYSTERECTOMY     nonmalignant reasons  . APPENDECTOMY    . 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 OSTEONICS  . 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 Congestive Heart Failure Mother        16, only child  . Other Father        was not in patient life    Medications- reviewed and updated Current Outpatient Medications  Medication Sig Dispense Refill  . atenolol-chlorthalidone (TENORETIC) 50-25 MG tablet TAKE 1 TABLET BY MOUTH DAILY 90 tablet 3  . atorvastatin  (LIPITOR) 40 MG tablet TAKE 1 TABLET(40 MG) BY MOUTH DAILY 90 tablet 1  . Calcium Carbonate-Vitamin D (CALCIUM PLUS VITAMIN D PO) Take 2.5 tablets by mouth daily.     . cyanocobalamin 100 MCG tablet Take 100 mcg by mouth daily.    . fluticasone (FLONASE) 50 MCG/ACT nasal spray Place 2 sprays into both nostrils daily. 48 g 3  . omeprazole (PRILOSEC) 20 MG capsule TAKE 1 CAPSULE(20 MG) BY MOUTH DAILY 90 capsule 1  . potassium chloride SA (K-DUR) 20 MEQ tablet TAKE 1 TABLET BY MOUTH TWICE DAILY 180 tablet 1   No current facility-administered medications for this visit.     Allergies-reviewed and updated Allergies  Allergen Reactions  . Coumadin [Warfarin Sodium]     Ulcers  . Methocarbamol     Hallucinations  . Norco [Hydrocodone-Acetaminophen]     Hallucinations   Social History   Social History Narrative   Lives alone. Great granddaughter stays with her on weekends.    Divorced 71.       Still doing childcare   Retired from [employer]   Objective  Objective:  BP 138/70   Pulse 76   Temp 97.8 F (36.6 C) (Temporal)   Ht 5\' 1"  (1.549 m)   Wt 146 lb 3.2 oz (66.3 kg)   SpO2 96%   BMI 27.62 kg/m  Gen: NAD, resting comfortably HEENT: Mask not removed due to  covid 19. TM normal. Bridge of nose normal. Eyelids normal.  Neck: no thyromegaly or cervical lymphadenopathy  CV: RRR no murmurs rubs or gallops Lungs: CTAB no crackles, wheeze, rhonchi Abdomen: soft/nontender/nondistended/normal bowel sounds. No rebound or guarding.  Ext: no edema Skin: warm, dry Neuro: grossly normal, moves all extremities, PERRLA   Assessment and Plan   78 y.o. female presenting for annual physical.  Health Maintenance counseling: 1. Anticipatory guidance: Patient counseled regarding regular dental exams q6 months- yes even with dentures, eye exams- yearly and thinking about cataract surgery ,  avoiding smoking and second hand smoke, limiting alcohol to 1 beverage per day- doesn't drink .    2. Risk factor reduction:  Advised patient of need for regular exercise and diet rich and fruits and vegetables to reduce risk of heart attack and stroke. Exercise-  Working with murphy/wainer on her hip- active with work, does some walking with greatgrandaughter on weekends- about 45 mins. Diet-down 5 lbs from last year- she is working to eat a healthy diet. Has cut down on tea and loaded potatoes.  Wt Readings from Last 3 Encounters:  10/03/19 146 lb 3.2 oz (66.3 kg)  02/11/19 150 lb 9.6 oz (68.3 kg)  07/23/18 151 lb (68.5 kg)  3. Immunizations/screenings/ancillary studies- flu shot today. Discussed shingirx at [facility] History  Administered Date(s) Administered  . Influenza Split 09/08/2011, 10/29/2012  . Influenza Whole 08/21/2008, 09/06/2010  . Influenza, High Dose Seasonal PF 09/29/2016, 08/23/2018  . Influenza,inj,Quad PF,6+ Mos 11/07/2013  . PPD Test 03/24/2017  . Pneumococcal Conjugate-13 11/07/2013  . Pneumococcal Polysaccharide-23 12/05/2005, 09/08/2011  . Td 12/05/2001  . Tdap 10/29/2012  . Zoster 08/21/2008  4. Cervical cancer screening-  Past age based screening. No vaginal bleeding 5. Breast cancer screening-  mammogram 09/25/2019 6. Colon cancer screening - had negative stool cards last year. Last colonoscopy had been over 10 years- she wants to discontinue checks as past age based screening.  7. Skin cancer screening- lower risk due to melanin content. advised regular sunscreen use. Denies worrisome, changing, or new skin lesions.  8. Birth control/STD check- not sexually active 9. Osteoporosis screening at 52- 2019 with repeat 2022 or so. 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 Plus very low risk procedure.   #Essential hypertension S: Compliant with atenolol-chlorthalidone 50-25 mg.   Also on potassium. A/P: Stable. Continue current medications.   #Hyperlipidemia S: Compliant with atorvastatin 40 mg.  LDL goal at least under 100. A/P:  Hopefully LDL at least under 100- at her age likely would not increase rx.    #GERD/history of GI bleed S: Compliant with omeprazole 20 mg- stay on PPI.  B12 last checked August 2019. -history of severe GI bleed years ago so keeping her on PPI A/P:Stable. Continue current medications.   -B12 has been low normal so she takes 100 mcg/day be on the safe side.  Update B12 today  # clear cough- - at least 10 years of clear or white cough - has been told post nasal drip in the past. Taking flonase consistently. muxinex tended to thicken secretions. Had x-ray in 2015 which was reassuring.  - Try xyzal before bed - can pick up at pharmacy. Please continue the flonase.   Recommended follow up: 6 months  Lab/Order associations: fasting   ICD-10-CM   1. Preventative health care  Z00.00 Comprehensive metabolic panel    CBC with Differential/Platelet    Lipid panel  2. Essential hypertension  I10   3. Hyperlipidemia LDL goal <100  E78.5 Comprehensive metabolic panel    CBC with Differential/Platelet    Lipid panel  4. Gastroesophageal reflux disease without esophagitis  K21.9   5. Osteopenia of lumbar spine  M85.88   6. History of gastrointestinal hemorrhage  Z87.19     No orders of the defined types were placed in this encounter.   Return precautions advised.  Tana Conch, MD

## 2019-10-03 ENCOUNTER — Other Ambulatory Visit: Payer: Self-pay

## 2019-10-03 ENCOUNTER — Encounter: Payer: Self-pay | Admitting: Family Medicine

## 2019-10-03 ENCOUNTER — Ambulatory Visit (INDEPENDENT_AMBULATORY_CARE_PROVIDER_SITE_OTHER): Payer: Medicare Other | Admitting: Family Medicine

## 2019-10-03 ENCOUNTER — Other Ambulatory Visit: Payer: Self-pay | Admitting: Family Medicine

## 2019-10-03 VITALS — BP 138/70 | HR 76 | Temp 97.8°F | Ht 61.0 in | Wt 146.2 lb

## 2019-10-03 DIAGNOSIS — Z8719 Personal history of other diseases of the digestive system: Secondary | ICD-10-CM

## 2019-10-03 DIAGNOSIS — E785 Hyperlipidemia, unspecified: Secondary | ICD-10-CM

## 2019-10-03 DIAGNOSIS — Z0000 Encounter for general adult medical examination without abnormal findings: Secondary | ICD-10-CM | POA: Diagnosis not present

## 2019-10-03 DIAGNOSIS — I10 Essential (primary) hypertension: Secondary | ICD-10-CM | POA: Diagnosis not present

## 2019-10-03 DIAGNOSIS — Z79899 Other long term (current) drug therapy: Secondary | ICD-10-CM

## 2019-10-03 DIAGNOSIS — K219 Gastro-esophageal reflux disease without esophagitis: Secondary | ICD-10-CM

## 2019-10-03 DIAGNOSIS — J309 Allergic rhinitis, unspecified: Secondary | ICD-10-CM | POA: Insufficient documentation

## 2019-10-03 DIAGNOSIS — M8588 Other specified disorders of bone density and structure, other site: Secondary | ICD-10-CM

## 2019-10-03 DIAGNOSIS — J301 Allergic rhinitis due to pollen: Secondary | ICD-10-CM

## 2019-10-03 DIAGNOSIS — Z23 Encounter for immunization: Secondary | ICD-10-CM | POA: Diagnosis not present

## 2019-10-03 LAB — CBC WITH DIFFERENTIAL/PLATELET
Basophils Absolute: 0.1 10*3/uL (ref 0.0–0.1)
Basophils Relative: 0.9 % (ref 0.0–3.0)
Eosinophils Absolute: 0.1 10*3/uL (ref 0.0–0.7)
Eosinophils Relative: 2.2 % (ref 0.0–5.0)
HCT: 37.6 % (ref 36.0–46.0)
Hemoglobin: 12.2 g/dL (ref 12.0–15.0)
Lymphocytes Relative: 24.3 % (ref 12.0–46.0)
Lymphs Abs: 1.4 10*3/uL (ref 0.7–4.0)
MCHC: 32.6 g/dL (ref 30.0–36.0)
MCV: 85.7 fl (ref 78.0–100.0)
Monocytes Absolute: 0.4 10*3/uL (ref 0.1–1.0)
Monocytes Relative: 7.1 % (ref 3.0–12.0)
Neutro Abs: 3.8 10*3/uL (ref 1.4–7.7)
Neutrophils Relative %: 65.5 % (ref 43.0–77.0)
Platelets: 230 10*3/uL (ref 150.0–400.0)
RBC: 4.38 Mil/uL (ref 3.87–5.11)
RDW: 14.4 % (ref 11.5–15.5)
WBC: 5.8 10*3/uL (ref 4.0–10.5)

## 2019-10-03 LAB — COMPREHENSIVE METABOLIC PANEL
ALT: 11 U/L (ref 0–35)
AST: 14 U/L (ref 0–37)
Albumin: 3.9 g/dL (ref 3.5–5.2)
Alkaline Phosphatase: 130 U/L — ABNORMAL HIGH (ref 39–117)
BUN: 23 mg/dL (ref 6–23)
CO2: 32 mEq/L (ref 19–32)
Calcium: 9.4 mg/dL (ref 8.4–10.5)
Chloride: 106 mEq/L (ref 96–112)
Creatinine, Ser: 1.58 mg/dL — ABNORMAL HIGH (ref 0.40–1.20)
GFR: 38.11 mL/min — ABNORMAL LOW (ref >60.00)
Glucose, Bld: 104 mg/dL — ABNORMAL HIGH (ref 70–99)
Potassium: 3.9 mEq/L (ref 3.5–5.1)
Sodium: 143 mEq/L (ref 135–145)
Total Bilirubin: 0.6 mg/dL (ref 0.2–1.2)
Total Protein: 6.7 g/dL (ref 6.0–8.3)

## 2019-10-03 LAB — LIPID PANEL
Cholesterol: 154 mg/dL (ref 0–200)
HDL: 50.2 mg/dL (ref >39.00)
LDL Cholesterol: 96 mg/dL (ref 0–99)
NonHDL: 103.74
Total CHOL/HDL Ratio: 3
Triglycerides: 38 mg/dL (ref 0.0–149.0)
VLDL: 7.6 mg/dL (ref 0.0–40.0)

## 2019-10-04 ENCOUNTER — Encounter: Payer: Self-pay | Admitting: Family Medicine

## 2019-10-04 DIAGNOSIS — N183 Chronic kidney disease, stage 3 unspecified: Secondary | ICD-10-CM | POA: Insufficient documentation

## 2019-10-04 DIAGNOSIS — N184 Chronic kidney disease, stage 4 (severe): Secondary | ICD-10-CM | POA: Insufficient documentation

## 2019-10-04 LAB — VITAMIN B12: Vitamin B-12: 269 pg/mL (ref 211–911)

## 2019-10-04 NOTE — Telephone Encounter (Signed)
Patient requesting fluticasone (FLONASE) 50 MCG/ACT nasal spray be filled today if possible, please advise

## 2019-10-04 NOTE — Telephone Encounter (Signed)
See note

## 2019-10-10 ENCOUNTER — Other Ambulatory Visit: Payer: Self-pay

## 2019-10-10 DIAGNOSIS — M1611 Unilateral primary osteoarthritis, right hip: Secondary | ICD-10-CM | POA: Insufficient documentation

## 2019-10-10 DIAGNOSIS — M7071 Other bursitis of hip, right hip: Secondary | ICD-10-CM | POA: Insufficient documentation

## 2019-10-10 MED ORDER — AMLODIPINE BESYLATE 5 MG PO TABS: 5.0000 mg | ORAL_TABLET | Freq: Every day | ORAL | 0 refills | Status: DC

## 2019-10-14 DIAGNOSIS — H2513 Age-related nuclear cataract, bilateral: Secondary | ICD-10-CM | POA: Diagnosis not present

## 2019-10-14 DIAGNOSIS — I10 Essential (primary) hypertension: Secondary | ICD-10-CM | POA: Diagnosis not present

## 2019-10-14 DIAGNOSIS — H2512 Age-related nuclear cataract, left eye: Secondary | ICD-10-CM | POA: Diagnosis not present

## 2019-10-14 DIAGNOSIS — H25011 Cortical age-related cataract, right eye: Secondary | ICD-10-CM | POA: Diagnosis not present

## 2019-10-14 DIAGNOSIS — H25043 Posterior subcapsular polar age-related cataract, bilateral: Secondary | ICD-10-CM | POA: Diagnosis not present

## 2019-11-04 ENCOUNTER — Encounter: Payer: Self-pay | Admitting: Family Medicine

## 2019-11-04 ENCOUNTER — Other Ambulatory Visit: Payer: Self-pay

## 2019-11-04 ENCOUNTER — Ambulatory Visit (INDEPENDENT_AMBULATORY_CARE_PROVIDER_SITE_OTHER): Payer: Medicare Other | Admitting: Family Medicine

## 2019-11-04 VITALS — BP 134/62 | HR 87 | Temp 97.6°F | Ht 61.0 in | Wt 146.8 lb

## 2019-11-04 DIAGNOSIS — N183 Chronic kidney disease, stage 3 unspecified: Secondary | ICD-10-CM | POA: Diagnosis not present

## 2019-11-04 DIAGNOSIS — R319 Hematuria, unspecified: Secondary | ICD-10-CM

## 2019-11-04 DIAGNOSIS — I10 Essential (primary) hypertension: Secondary | ICD-10-CM | POA: Diagnosis not present

## 2019-11-04 LAB — POC URINALSYSI DIPSTICK (AUTOMATED)
Bilirubin, UA: NEGATIVE
Glucose, UA: NEGATIVE
Ketones, UA: NEGATIVE
Nitrite, UA: POSITIVE
Protein, UA: POSITIVE — AB
Spec Grav, UA: 1.015 (ref 1.010–1.025)
Urobilinogen, UA: 0.2 E.U./dL
pH, UA: 6 (ref 5.0–8.0)

## 2019-11-04 LAB — BASIC METABOLIC PANEL
BUN: 19 mg/dL (ref 6–23)
CO2: 28 mEq/L (ref 19–32)
Calcium: 9.6 mg/dL (ref 8.4–10.5)
Chloride: 102 mEq/L (ref 96–112)
Creatinine, Ser: 1.17 mg/dL (ref 0.40–1.20)
GFR: 53.89 mL/min — ABNORMAL LOW (ref >60.00)
Glucose, Bld: 83 mg/dL (ref 70–99)
Potassium: 3.2 mEq/L — ABNORMAL LOW (ref 3.5–5.1)
Sodium: 140 mEq/L (ref 135–145)

## 2019-11-04 MED ORDER — FLUTICASONE PROPIONATE 50 MCG/ACT NA SUSP: NASAL | 5 refills | Status: DC

## 2019-11-04 MED ORDER — OMEPRAZOLE 20 MG PO CPDR: DELAYED_RELEASE_CAPSULE | ORAL | 1 refills | Status: DC

## 2019-11-04 MED ORDER — ATORVASTATIN CALCIUM 40 MG PO TABS: ORAL_TABLET | ORAL | 1 refills | Status: DC

## 2019-11-04 NOTE — Addendum Note (Signed)
Addended by: Tomi Likens on: 11/04/2019 09:57 AM   Modules accepted: Orders

## 2019-11-04 NOTE — Patient Instructions (Addendum)
Blood pressure looks good on just amlodipine 5 mg- lets continue this medicine  I am hoping kidneys from bloodwork have improved some from last visit- I am also checking a urine today  Could try hydrocortisone cream over the counter 1% twice a day for 7 days. Avoid scratching the area under brastrap  Recommended follow up: May follow up as long as labs are stable or improved   Please stop by lab before you go If you do not have mychart- we will call you about results within 5 business days of [place] receiving them.  If you have mychart- we will send your results within 3 business days of [place] receiving them.  If abnormal or we want to clarify a result, we will call or mychart you to make sure you receive the message.  If you have questions or concerns or don't hear within 5-7 days, please send [place] a message or call us.

## 2019-11-04 NOTE — Progress Notes (Signed)
Phone [phone] In person visit   Subjective:   Maria [name] is a 78 y.o. year old very pleasant female patient who presents for/with See problem oriented charting Chief Complaint  Patient presents with  . Follow-up  . Hypertension    ROS- Review of Systems  Constitutional: Negative.   HENT: Negative.   Eyes: Negative.   Respiratory: Negative.   Cardiovascular: Negative.   Gastrointestinal: Negative.   Genitourinary: Negative.   Musculoskeletal: Negative.   Skin: Positive for rash.       Left shoulder   Neurological: Negative.   Endo/Heme/Allergies: Negative.   Psychiatric/Behavioral: Negative.      This visit occurred during the SARS-CoV-2 public health emergency.  Safety protocols were in place, including screening questions prior to the visit, additional usage of staff PPE, and extensive cleaning of exam room while observing appropriate contact time as indicated for disinfecting solutions.   Past Medical History-  Patient Active Problem List   Diagnosis Date Noted  . History of gastrointestinal hemorrhage     Priority: High  . GERD (gastroesophageal reflux disease) 05/27/2014    Priority: Medium  . Hyperlipidemia LDL goal <100 08/21/2008    Priority: Medium  . Essential hypertension 08/21/2008    Priority: Medium  . 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 Osteoarthritis of right knee 06/03/2012    Priority: Low  . Arthritis of right hip 10/10/2019  . Bursitis of right hip 10/10/2019  . Chronic kidney disease (CKD), stage III (moderate) 10/04/2019  . Allergic rhinitis 10/03/2019    Medications- reviewed and updated Current Outpatient Medications  Medication Sig Dispense Refill  . amLODipine (NORVASC) 5 MG tablet Take 1 tablet (5 mg total) by mouth daily. 90 tablet 0  . atorvastatin (LIPITOR) 40 MG tablet TAKE 1 TABLET(40 MG) BY MOUTH DAILY 90 tablet 1  . Calcium Carbonate-Vitamin D (CALCIUM PLUS VITAMIN D PO) Take 2.5 tablets by  mouth daily.     . cyanocobalamin 100 MCG tablet Take 100 mcg by mouth daily.    . fluticasone (FLONASE) 50 MCG/ACT nasal spray SHAKE LIQUID AND USE 2 SPRAYS IN EACH NOSTRIL DAILY 16 g 3  . omeprazole (PRILOSEC) 20 MG capsule TAKE 1 CAPSULE(20 MG) BY MOUTH DAILY 90 capsule 1  . atenolol-chlorthalidone (TENORETIC) 50-25 MG tablet TAKE 1 TABLET BY MOUTH DAILY (Patient not taking: Reported on 11/04/2019) 90 tablet 3  . potassium chloride SA (K-DUR) 20 MEQ tablet TAKE 1 TABLET BY MOUTH TWICE DAILY (Patient not taking: Reported on 11/04/2019) 180 tablet 1   No current facility-administered medications for this visit.      Objective:  BP 134/62   Pulse 87   Temp 97.6 F (36.4 C) (Temporal)   Ht 5\' 1"  (1.549 m)   Wt 146 lb 12.8 oz (66.6 kg)   SpO2 97%   BMI 27.74 kg/m  Gen: NAD, resting comfortably CV: RRR no murmurs rubs or gallops Lungs: CTAB no crackles, wheeze, rhonchi Abdomen: soft/nontender/nondistended/normal bowel sounds. No rebound or guarding.  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    #Essential hypertension/CKD III S: Amlodipine 5 mg daily (we stopped atenolol chlorthalidone 50-25 mg last visit with worsening renal function- she held onto this nad potassium). She  has been checking readings at home. Most recent was last night and it was 140/60 but usually around that or lower  Already avoids nsaids. Tries to stay well hydrated BP Readings from Last 3 Encounters:  11/04/19 134/62  10/03/19 138/70  02/11/19 130/68   A/P: Good control in office today-continue current medicines - update GFR/bmp and urine today with worsening renal function last visit- hopeful remains in CKD III range and improved off chlorthalidone  #also rash under left upper  brastrap- minimal erythema but some excoriation- Could try hydrocortisone cream over the counter 1% twice a day for 7 days. Avoid scratching the area under brastrap  Recommended follow up: Already scheduled for visit in May- will keep this as long as kidney function looks good Future Appointments  Date Time Provider [place]  04/06/2020  8:00 AM Marin Olp, MD [facility]  10/12/2020  8:00 AM Marin Olp, MD [facility]    Lab/Order associations:   ICD-10-CM   1. Essential hypertension  I10 atenolol-chlorthalidone (TENORETIC) 50-25 MG tablet  2. Hyperlipidemia LDL goal <100  E78.5   3. Gastroesophageal reflux disease without esophagitis  K21.9 omeprazole (PRILOSEC) 20 MG capsule  4. Hypopotassemia  E87.6 potassium chloride SA (KLOR-CON) 20 MEQ tablet    No orders of the defined types were placed in this encounter.   Return precautions advised.  Garret Reddish, MD

## 2019-11-21 DIAGNOSIS — H2512 Age-related nuclear cataract, left eye: Secondary | ICD-10-CM | POA: Diagnosis not present

## 2019-11-22 DIAGNOSIS — H2511 Age-related nuclear cataract, right eye: Secondary | ICD-10-CM | POA: Diagnosis not present

## 2019-11-22 DIAGNOSIS — H25011 Cortical age-related cataract, right eye: Secondary | ICD-10-CM | POA: Diagnosis not present

## 2019-11-22 DIAGNOSIS — H25041 Posterior subcapsular polar age-related cataract, right eye: Secondary | ICD-10-CM | POA: Diagnosis not present

## 2019-12-05 DIAGNOSIS — H2511 Age-related nuclear cataract, right eye: Secondary | ICD-10-CM | POA: Diagnosis not present

## 2019-12-07 ENCOUNTER — Other Ambulatory Visit: Payer: Self-pay | Admitting: Family Medicine

## 2019-12-07 DIAGNOSIS — E876 Hypokalemia: Secondary | ICD-10-CM

## 2020-01-06 ENCOUNTER — Other Ambulatory Visit: Payer: Self-pay | Admitting: Family Medicine

## 2020-02-01 ENCOUNTER — Ambulatory Visit: Payer: Medicare Other | Attending: Internal Medicine

## 2020-02-01 DIAGNOSIS — Z23 Encounter for immunization: Secondary | ICD-10-CM

## 2020-02-01 NOTE — Progress Notes (Signed)
   Covid-19 Vaccination Clinic  Name:  [name]    MRN: [id number] DOB: 1941/11/09  02/01/2020  [name] was observed post Covid-19 immunization for 15 minutes without incidence. She was provided with Vaccine Information Sheet and instruction to access the V-Safe system.   [name] was instructed to call 911 with any severe reactions post vaccine: Marland Kitchen Difficulty breathing  . Swelling of your face and throat  . A fast heartbeat  . A bad rash all over your body  . Dizziness and weakness    Immunizations Administered    Name Date Dose VIS Date Route   Pfizer COVID-19 Vaccine 02/01/2020  5:44 PM 0.3 mL 11/15/2019 Intramuscular   Manufacturer: [employer], [employer]   Lot: [id number]   NDC: [id number]

## 2020-02-12 ENCOUNTER — Other Ambulatory Visit: Payer: Self-pay

## 2020-02-13 ENCOUNTER — Ambulatory Visit (INDEPENDENT_AMBULATORY_CARE_PROVIDER_SITE_OTHER): Payer: Medicare Other | Admitting: Family Medicine

## 2020-02-13 ENCOUNTER — Encounter: Payer: Self-pay | Admitting: Family Medicine

## 2020-02-13 VITALS — BP 150/75 | HR 52 | Temp 98.0°F | Ht 61.0 in | Wt 150.4 lb

## 2020-02-13 DIAGNOSIS — I10 Essential (primary) hypertension: Secondary | ICD-10-CM | POA: Diagnosis not present

## 2020-02-13 DIAGNOSIS — N183 Chronic kidney disease, stage 3 unspecified: Secondary | ICD-10-CM

## 2020-02-13 LAB — CBC WITH DIFFERENTIAL/PLATELET
Basophils Absolute: 0.1 10*3/uL (ref 0.0–0.1)
Basophils Relative: 0.9 % (ref 0.0–3.0)
Eosinophils Absolute: 0.1 10*3/uL (ref 0.0–0.7)
Eosinophils Relative: 1.2 % (ref 0.0–5.0)
HCT: 39.9 % (ref 36.0–46.0)
Hemoglobin: 13.2 g/dL (ref 12.0–15.0)
Lymphocytes Relative: 20.3 % (ref 12.0–46.0)
Lymphs Abs: 1.2 10*3/uL (ref 0.7–4.0)
MCHC: 33 g/dL (ref 30.0–36.0)
MCV: 83.7 fl (ref 78.0–100.0)
Monocytes Absolute: 0.5 10*3/uL (ref 0.1–1.0)
Monocytes Relative: 8 % (ref 3.0–12.0)
Neutro Abs: 4.1 10*3/uL (ref 1.4–7.7)
Neutrophils Relative %: 69.6 % (ref 43.0–77.0)
Platelets: 261 10*3/uL (ref 150.0–400.0)
RBC: 4.77 Mil/uL (ref 3.87–5.11)
RDW: 15 % (ref 11.5–15.5)
WBC: 5.9 10*3/uL (ref 4.0–10.5)

## 2020-02-13 LAB — COMPREHENSIVE METABOLIC PANEL
ALT: 11 U/L (ref 0–35)
AST: 19 U/L (ref 0–37)
Albumin: 3.7 g/dL (ref 3.5–5.2)
Alkaline Phosphatase: 130 U/L — ABNORMAL HIGH (ref 39–117)
BUN: 17 mg/dL (ref 6–23)
CO2: 31 mEq/L (ref 19–32)
Calcium: 10 mg/dL (ref 8.4–10.5)
Chloride: 102 mEq/L (ref 96–112)
Creatinine, Ser: 1.55 mg/dL — ABNORMAL HIGH (ref 0.40–1.20)
GFR: 38.93 mL/min — ABNORMAL LOW (ref >60.00)
Glucose, Bld: 103 mg/dL — ABNORMAL HIGH (ref 70–99)
Potassium: 4.4 mEq/L (ref 3.5–5.1)
Sodium: 140 mEq/L (ref 135–145)
Total Bilirubin: 0.8 mg/dL (ref 0.2–1.2)
Total Protein: 6.8 g/dL (ref 6.0–8.3)

## 2020-02-13 LAB — URINALYSIS, MICROSCOPIC ONLY

## 2020-02-13 MED ORDER — AMLODIPINE BESYLATE 5 MG PO TABS: ORAL_TABLET | ORAL | 1 refills | Status: DC

## 2020-02-13 NOTE — Patient Instructions (Addendum)
Increase amlodipine to 5 mg twice a day with one dose in the morning and one in the evening  Buy omron series 3 cuff from [facility]- return your [facility] cuff.   Monitor blood pressure and write down daily- bring it back in 3-4 weeks for another visit with me. If you are getting blood pressures above 150 regularly or below 110 or you get really lightheaded call us and let us know or if you have other symptoms yo Caryn Section concerned about  Please stop by lab before you go If you do not have mychart- we will call you about results within 5 business days of [place] receiving them.  If you have mychart- we will send your results within 3 business days of [place] receiving them.  If abnormal or we want to clarify a result, we will call or mychart you to make sure you receive the message.  If you have questions or concerns or don't hear within 5 business days, please send [place] a message or call us.

## 2020-02-13 NOTE — Progress Notes (Signed)
Phone [phone] In person visit   Subjective:   COLLEENE SWARTHOUT is a 79 y.o. year old very pleasant female patient who presents for/with See problem oriented charting Chief Complaint  Patient presents with  . Follow-up  . Hypertension    This visit occurred during the SARS-CoV-2 public health emergency.  Safety protocols were in place, including screening questions prior to the visit, additional usage of staff PPE, and extensive cleaning of exam room while observing appropriate contact time as indicated for disinfecting solutions.   Past Medical History-  Patient Active Problem List   Diagnosis Date Noted  . History of gastrointestinal hemorrhage     Priority: High  . GERD (gastroesophageal reflux disease) 05/27/2014    Priority: Medium  . Hyperlipidemia LDL goal <100 08/21/2008    Priority: Medium  . Essential hypertension 08/21/2008    Priority: Medium  . 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 Osteoarthritis of right knee 06/03/2012    Priority: Low  . Arthritis of right hip 10/10/2019  . Bursitis of right hip 10/10/2019  . Chronic kidney disease (CKD), stage III (moderate) 10/04/2019  . Allergic rhinitis 10/03/2019    Medications- reviewed and updated Current Outpatient Medications  Medication Sig Dispense Refill  . amLODipine (NORVASC) 5 MG tablet TAKE 1 TABLET(5 MG) BY MOUTH TWICE A day 180 tablet 1  . atorvastatin (LIPITOR) 40 MG tablet TAKE 1 TABLET(40 MG) BY MOUTH DAILY 90 tablet 1  . Calcium Carbonate-Vitamin D (CALCIUM PLUS VITAMIN D PO) Take 2.5 tablets by mouth daily.     . cyanocobalamin 100 MCG tablet Take 100 mcg by mouth daily.    . fluticasone (FLONASE) 50 MCG/ACT nasal spray SHAKE LIQUID AND USE 2 SPRAYS IN EACH NOSTRIL DAILY 16 g 5  . omeprazole (PRILOSEC) 20 MG capsule TAKE 1 CAPSULE(20 MG) BY MOUTH DAILY 90 capsule 1   No current facility-administered medications for this visit.     Objective:  BP (!) 150/75 Comment:  automated cuff here. her automated cuff 190/90. my reading 156/86  Pulse (!) 52   Temp 98 F (36.7 C)   Ht 5\' 1"  (1.549 m)   Wt 150 lb 6.4 oz (68.2 kg)   SpO2 96%   BMI 28.42 kg/m  Gen: NAD, resting comfortably CV: RRR no murmurs rubs or gallops Lungs: CTAB no crackles, wheeze, rhonchi Ext: no edema Skin: warm, dry    Assessment and Plan   #Essential hypertension/CKD III S: Compliant with amlodipine 5 mg. Prior was on atenolol-chlorthalidone 50-25 mg but had worsening of renal function and issues maintaining potassium.  She has noted some home #s up to 190s or 200 with a home cuff (did not appear at all accurate today). Felt like pressures were up higher since . Did have some higher salt content recently with shrimp scampi. Also recently had covid shot and wondered if that increased BP BP Readings from Last 3 Encounters:  02/13/20 (!) 150/75  11/04/19 134/62  10/03/19 138/70  A/P: Poor control.   Initial blood pressure was really good 122/84- when I came in Blood pressures started out in 160s but trended down to 150/75 Iincluding on our automated cuff vs. Her cuff showing 190s/90s). Still with mild elevation in blood pressure today- she had started doing amlodipine in the daytime instead of nighttime. I am going to have her take the amlodipine twice a day- once in morning and once in evening at 5 mg -she is going to return [facility]  blood pressure cuff and get an omron series 3 cuff  Last GFR improved- after stopping diuretic. Will update again today  Recommended follow up:  Future Appointments  Date Time Provider [place]  04/06/2020  8:00 AM Marin Olp, MD [facility]  10/12/2020  8:00 AM Yong Channel Brayton Mars, MD [facility]    Lab/Order associations:   ICD-10-CM   1. Essential hypertension  I10 CBC with Differential/Platelet    Comprehensive metabolic panel    Urine Microscopic  2. Stage 3 chronic kidney disease, unspecified whether stage 3a or 3b CKD  N18.30  CBC with Differential/Platelet    Comprehensive metabolic panel    Urine Microscopic   Meds ordered this encounter  Medications  . amLODipine (NORVASC) 5 MG tablet    Sig: TAKE 1 TABLET(5 MG) BY MOUTH TWICE A day    Dispense:  180 tablet    Refill:  1   Return precautions advised.  Garret Reddish, MD

## 2020-02-14 ENCOUNTER — Other Ambulatory Visit: Payer: Medicare Other

## 2020-02-14 ENCOUNTER — Other Ambulatory Visit: Payer: Self-pay

## 2020-02-14 DIAGNOSIS — D72829 Elevated white blood cell count, unspecified: Secondary | ICD-10-CM

## 2020-02-14 DIAGNOSIS — R319 Hematuria, unspecified: Secondary | ICD-10-CM

## 2020-02-14 NOTE — Addendum Note (Signed)
Addended by: Jorene Guest on: 02/14/2020 03:04 PM   Modules accepted: Orders

## 2020-02-15 LAB — URINALYSIS, MICROSCOPIC ONLY
Bacteria, UA: NONE SEEN /HPF
Hyaline Cast: NONE SEEN /LPF
RBC / HPF: NONE SEEN /HPF (ref 0–2)
Squamous Epithelial / HPF: NONE SEEN /HPF (ref <=5)

## 2020-03-24 ENCOUNTER — Other Ambulatory Visit: Payer: Self-pay

## 2020-03-24 DIAGNOSIS — R319 Hematuria, unspecified: Secondary | ICD-10-CM

## 2020-03-25 ENCOUNTER — Ambulatory Visit (INDEPENDENT_AMBULATORY_CARE_PROVIDER_SITE_OTHER): Payer: Medicare Other

## 2020-03-25 ENCOUNTER — Other Ambulatory Visit: Payer: Self-pay

## 2020-03-25 ENCOUNTER — Other Ambulatory Visit: Payer: Medicare Other

## 2020-03-25 DIAGNOSIS — Z111 Encounter for screening for respiratory tuberculosis: Secondary | ICD-10-CM

## 2020-03-25 DIAGNOSIS — R319 Hematuria, unspecified: Secondary | ICD-10-CM | POA: Diagnosis not present

## 2020-03-25 NOTE — Progress Notes (Signed)
Per orders of Dr. Durene Cal, injection of PPD given by Donnamarie Poag in left deltoid. Patient tolerated injection well. Patient will make appointment for 2 days for readng. And to get completed forms.

## 2020-03-26 LAB — URINE CULTURE
MICRO NUMBER:: 10389830
SPECIMEN QUALITY:: ADEQUATE

## 2020-03-27 LAB — TB SKIN TEST
Induration: 0 mm
TB Skin Test: NEGATIVE

## 2020-04-06 ENCOUNTER — Other Ambulatory Visit: Payer: Self-pay

## 2020-04-06 ENCOUNTER — Ambulatory Visit (INDEPENDENT_AMBULATORY_CARE_PROVIDER_SITE_OTHER): Payer: Medicare Other | Admitting: Family Medicine

## 2020-04-06 ENCOUNTER — Encounter: Payer: Self-pay | Admitting: Family Medicine

## 2020-04-06 VITALS — BP 138/78 | HR 84 | Temp 97.8°F | Ht 61.0 in | Wt 153.0 lb

## 2020-04-06 DIAGNOSIS — E785 Hyperlipidemia, unspecified: Secondary | ICD-10-CM | POA: Diagnosis not present

## 2020-04-06 DIAGNOSIS — R748 Abnormal levels of other serum enzymes: Secondary | ICD-10-CM | POA: Diagnosis not present

## 2020-04-06 DIAGNOSIS — Z79899 Other long term (current) drug therapy: Secondary | ICD-10-CM | POA: Diagnosis not present

## 2020-04-06 DIAGNOSIS — N183 Chronic kidney disease, stage 3 unspecified: Secondary | ICD-10-CM

## 2020-04-06 DIAGNOSIS — K219 Gastro-esophageal reflux disease without esophagitis: Secondary | ICD-10-CM

## 2020-04-06 DIAGNOSIS — R600 Localized edema: Secondary | ICD-10-CM

## 2020-04-06 DIAGNOSIS — R5383 Other fatigue: Secondary | ICD-10-CM | POA: Diagnosis not present

## 2020-04-06 DIAGNOSIS — M8588 Other specified disorders of bone density and structure, other site: Secondary | ICD-10-CM | POA: Diagnosis not present

## 2020-04-06 DIAGNOSIS — I10 Essential (primary) hypertension: Secondary | ICD-10-CM

## 2020-04-06 LAB — CBC WITH DIFFERENTIAL/PLATELET
Basophils Absolute: 0 10*3/uL (ref 0.0–0.1)
Basophils Relative: 0.8 % (ref 0.0–3.0)
Eosinophils Absolute: 0.1 10*3/uL (ref 0.0–0.7)
Eosinophils Relative: 2.1 % (ref 0.0–5.0)
HCT: 38.1 % (ref 36.0–46.0)
Hemoglobin: 12.5 g/dL (ref 12.0–15.0)
Lymphocytes Relative: 19.4 % (ref 12.0–46.0)
Lymphs Abs: 1.1 10*3/uL (ref 0.7–4.0)
MCHC: 32.8 g/dL (ref 30.0–36.0)
MCV: 84 fl (ref 78.0–100.0)
Monocytes Absolute: 0.4 10*3/uL (ref 0.1–1.0)
Monocytes Relative: 7.4 % (ref 3.0–12.0)
Neutro Abs: 4.1 10*3/uL (ref 1.4–7.7)
Neutrophils Relative %: 70.3 % (ref 43.0–77.0)
Platelets: 253 10*3/uL (ref 150.0–400.0)
RBC: 4.53 Mil/uL (ref 3.87–5.11)
RDW: 15 % (ref 11.5–15.5)
WBC: 5.9 10*3/uL (ref 4.0–10.5)

## 2020-04-06 LAB — VITAMIN D 25 HYDROXY (VIT D DEFICIENCY, FRACTURES): VITD: 44.38 ng/mL (ref 30.00–100.00)

## 2020-04-06 LAB — COMPREHENSIVE METABOLIC PANEL
ALT: 10 U/L (ref 0–35)
AST: 17 U/L (ref 0–37)
Albumin: 3.8 g/dL (ref 3.5–5.2)
Alkaline Phosphatase: 141 U/L — ABNORMAL HIGH (ref 39–117)
BUN: 27 mg/dL — ABNORMAL HIGH (ref 6–23)
CO2: 27 mEq/L (ref 19–32)
Calcium: 9.4 mg/dL (ref 8.4–10.5)
Chloride: 103 mEq/L (ref 96–112)
Creatinine, Ser: 1.57 mg/dL — ABNORMAL HIGH (ref 0.40–1.20)
GFR: 38.34 mL/min — ABNORMAL LOW (ref >60.00)
Glucose, Bld: 89 mg/dL (ref 70–99)
Potassium: 4.3 mEq/L (ref 3.5–5.1)
Sodium: 139 mEq/L (ref 135–145)
Total Bilirubin: 0.6 mg/dL (ref 0.2–1.2)
Total Protein: 6.6 g/dL (ref 6.0–8.3)

## 2020-04-06 LAB — GAMMA GT: GGT: 17 U/L (ref 7–51)

## 2020-04-06 LAB — TSH: TSH: 1.58 u[IU]/mL (ref 0.35–4.50)

## 2020-04-06 LAB — VITAMIN B12: Vitamin B-12: 521 pg/mL (ref 211–911)

## 2020-04-06 NOTE — Addendum Note (Signed)
Addended by: Shelva Majestic on: 04/06/2020 02:26 PM   Modules accepted: Orders

## 2020-04-06 NOTE — Progress Notes (Signed)
Phone [phone] In person visit   Subjective:   Maria [name] is a 79 y.o. year old very pleasant female patient who presents for/with See problem oriented charting Chief Complaint  Patient presents with  . Hypertension  . Hyperlipidemia   This visit occurred during the SARS-CoV-2 public health emergency.  Safety protocols were in place, including screening questions prior to the visit, additional usage of staff PPE, and extensive cleaning of exam room while observing appropriate contact time as indicated for disinfecting solutions.   Past Medical History-  Patient Active Problem List   Diagnosis Date Noted  . History of gastrointestinal hemorrhage     Priority: High  . Chronic kidney disease (CKD), stage III (moderate) 10/04/2019    Priority: Medium  . GERD (gastroesophageal reflux disease) 05/27/2014    Priority: Medium  . Hyperlipidemia LDL goal <100 08/21/2008    Priority: Medium  . Essential hypertension 08/21/2008    Priority: Medium  . Arthritis of right hip 10/10/2019    Priority: Low  . Bursitis of right hip 10/10/2019    Priority: Low  . Allergic rhinitis 10/03/2019    Priority: Low  . 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 Osteoarthritis of right knee 06/03/2012    Priority: Low    Medications- reviewed and updated Current Outpatient Medications  Medication Sig Dispense Refill  . amLODipine (NORVASC) 5 MG tablet TAKE 1 TABLET(5 MG) BY MOUTH TWICE A day 180 tablet 1  . atorvastatin (LIPITOR) 40 MG tablet TAKE 1 TABLET(40 MG) BY MOUTH DAILY 90 tablet 1  . Calcium Carbonate-Vitamin D (CALCIUM PLUS VITAMIN D PO) Take 2.5 tablets by mouth daily.     . cyanocobalamin 100 MCG tablet Take 100 mcg by mouth daily.    . fluticasone (FLONASE) 50 MCG/ACT nasal spray SHAKE LIQUID AND USE 2 SPRAYS IN EACH NOSTRIL DAILY 16 g 5  . omeprazole (PRILOSEC) 20 MG capsule TAKE 1 CAPSULE(20 MG) BY MOUTH DAILY 90 capsule 1   No current  facility-administered medications for this visit.     Objective:  BP 138/78   Pulse 84   Temp 97.8 F (36.6 C) (Temporal)   Ht 5\' 1"  (1.549 m)   Wt 153 lb (69.4 kg)   SpO2 97%   BMI 28.91 kg/m  Gen: NAD, resting comfortably CV: RRR no murmurs rubs or gallops Lungs: CTAB no crackles, wheeze, rhonchi Abdomen: soft/nontender/nondistended/normal bowel sounds. No rebound or guarding.  Ext: trace edema (better when props her legs up) Skin: warm, dry  EKG: sinus rhythm with rate 88 and 2 PVCs noted, normal axis, normal intervals, no hypertrophy, no significant st or t wave changes from 02/18/15 EKG. Slight flattening in III and avf appear similar. qrs complex in oppositive direction from t wave in v1-v3 but unchanged  Wt Readings from Last 3 Encounters:  04/06/20 153 lb (69.4 kg)  02/13/20 150 lb 6.4 oz (68.2 kg)  11/04/19 146 lb 12.8 oz (66.6 kg)      Assessment and Plan   # Fatigue S: Feels like has had a drop in her energy since getting 2nd covid shot.   She gets really tired after being active- doesn't feel like she can do as much as she used to be able to. No chest pain or shortness of breath with these episodes. 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 No left arm or neck pain or abnormal diaphoresis  Has had cough for over 5 years- was told allergy related in the past- she has not had as much improvement as she would like with flonase and OTC allergy medicine.  Xyzal. CXR 03/29/16 normal A/P: 79 year old female with new onset fatigue.  We will update labs as below.  Given she has had a long-term chronic cough we will also check a chest x-ray.  If she has new or worsening symptoms she will let us know.  I will also have my team obtain an EKG-any abnormalities consider cardiology referral -weight has trended up about 7 lbs recently since november- that could contribute to fatigue. Could consider echocardiogram.    #Essential hypertension/CKD Stage III S: Compliant with amlodipine 5 mg twice daily (some edema on this). Prior was on atenolol-chlorthalidone 50-25 mg but had worsening of renal function and issues maintaining potassium.  GFR ranging from high 30s to 50s since 2019 BP Readings from Last 3 Encounters:  04/06/20 138/78  02/13/20 (!) 150/75  11/04/19 134/62  A/P: Improved control of blood pressure with increasing amlodipine to twice a day.  Continue current meds  For CKD III- avoid nsaids. Monitor GFR.   #Hyperlipidemia S: Compliant with atorvastatin 40 mg.  LDL goal at least under 100. Lab Results  Component Value Date   CHOL 154 10/03/2019   HDL 50.20 10/03/2019   LDLCALC 96 10/03/2019   LDLDIRECT 100.0 07/23/2018   TRIG 38.0 10/03/2019   CHOLHDL 3 10/03/2019  A/P:  Stable. Continue current medications.    #GERD S: Compliant with omeprazole 20 mg.  B12 last checked August 2019. Lab Results  Component Value Date   VITAMINB12 269 10/03/2019  -history of severe GI bleed years ago so keeping her on PPI despite CKD III A/P: reflux well controlled. Is on oral b12 100 mcg but will check b12 to make sure not low with fatigue   Recommended follow up: CPE 10/12/20 scheduled - sooner if needed if fatigue worsens or other new symptoms Future Appointments  Date Time Provider [place]  10/12/2020  8:00 AM Marin Olp, MD [facility]    Lab/Order associations:   ICD-10-CM   1. Essential hypertension  I10 CBC with Differential/Platelet    Comprehensive metabolic panel  2. Hyperlipidemia LDL goal <100  E78.5   3. Gastroesophageal reflux disease without esophagitis  K21.9   4. Stage 3 chronic kidney disease, unspecified whether stage 3a or 3b CKD  N18.30   5. Elevated alkaline phosphatase level  R74.8 VITAMIN D 25 Hydroxy (Vit-D Deficiency, Fractures)    Gamma GT  6. Fatigue, unspecified type  R53.83 TSH    EKG 12-Lead  7. High risk medication use  Z79.899 Vitamin B12   8. Osteopenia of lumbar spine  M85.88 VITAMIN D 25 Hydroxy (Vit-D Deficiency, Fractures)   Return precautions advised.  Garret Reddish, MD

## 2020-04-06 NOTE — Patient Instructions (Addendum)
Please stop by lab and x-ray before you go If you have mychart- we will send your results within 3 business days of [place] receiving them.  If you do not have mychart- we will call you about results within 5 business days of [place] receiving them.   EKG before you go  Recommended follow up: CPE 10/12/20 scheduled - sooner if needed if fatigue worsens or other new symptoms (particularly need urgent evaluation for chest pain or shortness of breath)

## 2020-04-08 ENCOUNTER — Telehealth: Payer: Self-pay | Admitting: Family Medicine

## 2020-04-08 ENCOUNTER — Other Ambulatory Visit: Payer: Self-pay | Admitting: Family Medicine

## 2020-04-08 NOTE — Telephone Encounter (Signed)
Echo - CPT code 93306  As part of our Prior Authorization Reduction program, [employer] no longer requires prior authorization for CT, MRI/MRA and transthoracic echocardiography procedures for Medicare members effective 12/05/2016 

## 2020-04-09 LAB — HEMOGLOBIN A1C: Hemoglobin A1C: 6.8

## 2020-04-10 ENCOUNTER — Telehealth: Payer: Self-pay | Admitting: Family Medicine

## 2020-04-10 NOTE — Telephone Encounter (Signed)
Sorry initial line should state-"this seems inconsistent"

## 2020-04-10 NOTE — Telephone Encounter (Signed)
Had a visit with patient yesterday.  According to plan a A1C was completed.  This resulted at 6.8.    Called to notify office.

## 2020-04-10 NOTE — Telephone Encounter (Signed)
Ms. seems inconsistent with readings in office with last blood sugar fasting being under 100.  Please repeat A1c in office under hyperglycemia-if her level really is this high would be a new diagnosis of diabetes

## 2020-04-10 NOTE — Telephone Encounter (Signed)
FYI see below, A1c abstracted.

## 2020-04-14 ENCOUNTER — Other Ambulatory Visit: Payer: Self-pay

## 2020-04-14 DIAGNOSIS — R739 Hyperglycemia, unspecified: Secondary | ICD-10-CM

## 2020-04-14 MED ORDER — OMEPRAZOLE 20 MG PO CPDR: DELAYED_RELEASE_CAPSULE | ORAL | 1 refills | Status: DC

## 2020-04-14 NOTE — Telephone Encounter (Signed)
Called and lm on pt vm tcb, please schedule pt for lab visit for a1c. Future a1c ordered.

## 2020-04-14 NOTE — Telephone Encounter (Signed)
Pt called back and lab has been scheduled.

## 2020-04-16 ENCOUNTER — Other Ambulatory Visit: Payer: Self-pay

## 2020-04-16 ENCOUNTER — Other Ambulatory Visit (INDEPENDENT_AMBULATORY_CARE_PROVIDER_SITE_OTHER): Payer: Medicare Other

## 2020-04-16 ENCOUNTER — Encounter: Payer: Self-pay | Admitting: Family Medicine

## 2020-04-16 DIAGNOSIS — R739 Hyperglycemia, unspecified: Secondary | ICD-10-CM

## 2020-04-16 DIAGNOSIS — E1122 Type 2 diabetes mellitus with diabetic chronic kidney disease: Secondary | ICD-10-CM | POA: Insufficient documentation

## 2020-04-16 LAB — HEMOGLOBIN A1C: Hgb A1c MFr Bld: 5.9 % (ref 4.6–6.5)

## 2020-04-27 ENCOUNTER — Other Ambulatory Visit: Payer: Self-pay

## 2020-04-27 ENCOUNTER — Ambulatory Visit (HOSPITAL_COMMUNITY): Payer: Medicare Other | Attending: Cardiology

## 2020-04-27 DIAGNOSIS — R748 Abnormal levels of other serum enzymes: Secondary | ICD-10-CM

## 2020-04-27 DIAGNOSIS — R5383 Other fatigue: Secondary | ICD-10-CM | POA: Diagnosis not present

## 2020-04-27 DIAGNOSIS — R600 Localized edema: Secondary | ICD-10-CM

## 2020-06-02 NOTE — Patient Instructions (Addendum)
Please stop by lab before you go If you have mychart- we will send your results within 3 business days of [place] receiving them.  If you do not have mychart- we will call you about results within 5 business days of [place] receiving them.   Please go to [facility]  [facility] (updated 01/30/2020) - located [address] across the [address] from [place] - in the basement - Hours: 8:30-5:00 PM M-F (with lunch from 12:30- 1 PM). You do NOT need an appointment.  - Please ensure you are covid symptom free before going in(No fever, chills, cough, congestion, runny nose, shortness of breath, fatigue, body aches, sore throat, headache, nausea, vomiting, diarrhea, or new loss of taste or smell. No known contacts with covid 19 or someone being tested for covid 19)  If affordable start bystolic 2.5 mg for blood pressure- can take this in the evening  Reduce amlodipine to just once in the evening- I think amlodipine is contributing to your swelling. I am hoping reducing dose will help. Try to elevate your legs when you can. If kidney function is better and no urinary tract infection  or at least stable I may also use a short term diuretic

## 2020-06-02 NOTE — Progress Notes (Signed)
Phone [phone] In person visit   Subjective:   Maria [name] is a 79 y.o. year old very pleasant female patient who presents for/with See problem oriented charting Chief Complaint  Patient presents with  . Follow-up   This visit occurred during the SARS-CoV-2 public health emergency.  Safety protocols were in place, including screening questions prior to the visit, additional usage of staff PPE, and extensive cleaning of exam room while observing appropriate contact time as indicated for disinfecting solutions.   Past Medical History-  Patient Active Problem List   Diagnosis Date Noted  . History of gastrointestinal hemorrhage     Priority: High  . Chronic kidney disease (CKD), stage III (moderate) 10/04/2019    Priority: Medium  . GERD (gastroesophageal reflux disease) 05/27/2014    Priority: Medium  . Hyperlipidemia LDL goal <100 08/21/2008    Priority: Medium  . Essential hypertension 08/21/2008    Priority: Medium  . Arthritis of right hip 10/10/2019    Priority: Low  . Bursitis of right hip 10/10/2019    Priority: Low  . Allergic rhinitis 10/03/2019    Priority: Low  . 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 Osteoarthritis of right knee 06/03/2012    Priority: Low  . Hyperglycemia 04/16/2020    Medications- reviewed and updated Current Outpatient Medications  Medication Sig Dispense Refill  . atorvastatin (LIPITOR) 40 MG tablet TAKE 1 TABLET(40 MG) BY MOUTH DAILY 90 tablet 1  . Calcium Carbonate-Vitamin D (CALCIUM PLUS VITAMIN D PO) Take 2.5 tablets by mouth daily.     . cyanocobalamin 100 MCG tablet Take 100 mcg by mouth daily.    . fluticasone (FLONASE) 50 MCG/ACT nasal spray SHAKE LIQUID AND USE 2 SPRAYS IN EACH NOSTRIL DAILY 16 g 5  . omeprazole (PRILOSEC) 20 MG capsule TAKE 1 CAPSULE(20 MG) BY MOUTH DAILY 90 capsule 1  . amLODipine (NORVASC) 5 MG tablet Take 1 tablet (5 mg total) by mouth at bedtime. 90 tablet 3  . nebivolol  (BYSTOLIC) 2.5 MG tablet Take 1 tablet (2.5 mg total) by mouth daily. 30 tablet 5   No current facility-administered medications for this visit.     Objective:  BP (!) 164/62   Pulse (!) 108   Temp 98.6 F (37 C)   Ht 5\' 1"  (1.549 m)   Wt 155 lb 6.4 oz (70.5 kg)   SpO2 97%   BMI 29.36 kg/m  Gen: NAD, resting comfortably CV: RRR on recheck but on initial check was slightly irregular- I was concerned for PVCs (initially tachycardic but went down to 90s by end of visit)no murmurs rubs or gallops Lungs: CTAB no crackles, wheeze, rhonchi Abdomen: soft/nontender/nondistended/normal bowel sounds. Ext: Bilateral 1+ edema Skin: warm, dry  EKG: sinus rhythm with rate 92, normal axis, slightly short pr (no delta waves) but otherwise normal intervals, no hypertrophy, no st or t wave changes     Assessment and Plan   #Hypertension  #Bilateral leg edema  S: pt c/o bilateral leg swelling and it is not getting better and she states her BP is running high also. She thinks her meds may need to be adjusted.  Patient does have CKD stage III with GFR in the 30s creatinine usually around 1.5  Blood pressures at home ranging from the 140s to the 170s over 80s to 90s for the most part-occasionally higher.  This is despite taking amlodipine 5 mg twice daily.  She is trying to eat a healthy  diet.  She is remaining active  Feels like she has had more issues in general since march since Covid vaccine-has had issues with fatigue.  Also complains of a chronic cough.  Lab work in early May was reassuring including TSH, CBC, CMP.  Does have slightly elevated alkaline phosphatase though GGT not elevated and echocardiogram reassuring-check due to edema.  She has had an occasional dull headache-she is going to try Tylenol Extra Strength with the next 1-if has worsening pattern will let me know A/P: I am concerned leg edema could be from higher dose of amlodipine-we can reduce to 5 mg at night.  Add Bystolic 2.5 mg at  night.  I am hesitant with GFR in the 30s on adding ACE/ARB/diuretic-previously had worsening renal function on hydrochlorothiazide.  Will titrate Bystolic up as able-for now I think her heart rate can handle this -May also use a short course of diuretic Lasix depending on renal function and response to reducing amlodipine -Did have irregular heart rate at first but EKG was reassuring.  Last TSH was okay but we will recheck as well as check CBC and CMP.  # Urinary frequency S: Patient complains of some urinary frequency in recent weeks A/P: This also makes me hesitant to add a diuretic.  We will check a urinalysis and urine culture to make sure no infection  #Cough for years- thought to be allergies by Dr. Tawanna Cooler. flonase did not help most recently when tried. Never smoker.  We will update a chest x-ray especially with ongoing fatigue issues  #High alkaline phosphatase-no obvious cause on labs.  GGT not elevated.  Vitamin D not elevated.  Echocardiogram without frank heart failure but with weight gain and edema I am considering using Lasix at least short-term  Recommended follow up:  Future Appointments  Date Time Provider Department Center  06/18/2020 10:00 AM Shelva Majestic, MD [facility]  10/12/2020  8:00 AM Shelva Majestic, MD [facility]   Lab/Order associations: 2-week follow-up-can use same-day slot   ICD-10-CM   1. Essential hypertension  I10   2. Bilateral edema of lower extremity  R60.0   3. Stage 3 chronic kidney disease, unspecified whether stage 3a or 3b CKD  N18.30 CBC with Differential/Platelet    Comprehensive metabolic panel  4. Irregular heart rate  I49.9 TSH    EKG 12-Lead  5. Chronic cough  R05 DG Chest 2 View  6. Urinary frequency  R35.0 POCT Urinalysis Dipstick (Automated)    Urine Culture   Meds ordered this encounter  Medications  . amLODipine (NORVASC) 5 MG tablet    Sig: Take 1 tablet (5 mg total) by mouth at bedtime.    Dispense:  90 tablet    Refill:   3  . nebivolol (BYSTOLIC) 2.5 MG tablet    Sig: Take 1 tablet (2.5 mg total) by mouth daily.    Dispense:  30 tablet    Refill:  5   Return precautions advised.  Tana Conch, MD

## 2020-06-03 ENCOUNTER — Ambulatory Visit (INDEPENDENT_AMBULATORY_CARE_PROVIDER_SITE_OTHER): Payer: Medicare Other | Admitting: Family Medicine

## 2020-06-03 ENCOUNTER — Other Ambulatory Visit: Payer: Self-pay

## 2020-06-03 ENCOUNTER — Telehealth: Payer: Self-pay | Admitting: Family Medicine

## 2020-06-03 ENCOUNTER — Ambulatory Visit (INDEPENDENT_AMBULATORY_CARE_PROVIDER_SITE_OTHER)
Admission: RE | Admit: 2020-06-03 | Discharge: 2020-06-03 | Disposition: A | Discharge status: Home / Self Care | Payer: Medicare Other | Source: Ambulatory Visit | Attending: Family Medicine | Admitting: Family Medicine

## 2020-06-03 ENCOUNTER — Encounter: Payer: Self-pay | Admitting: Family Medicine

## 2020-06-03 VITALS — BP 164/62 | HR 108 | Temp 98.6°F | Ht 61.0 in | Wt 155.4 lb

## 2020-06-03 DIAGNOSIS — N183 Chronic kidney disease, stage 3 unspecified: Secondary | ICD-10-CM | POA: Diagnosis not present

## 2020-06-03 DIAGNOSIS — R053 Chronic cough: Secondary | ICD-10-CM

## 2020-06-03 DIAGNOSIS — R350 Frequency of micturition: Secondary | ICD-10-CM

## 2020-06-03 DIAGNOSIS — R05 Cough: Secondary | ICD-10-CM

## 2020-06-03 DIAGNOSIS — I499 Cardiac arrhythmia, unspecified: Secondary | ICD-10-CM | POA: Diagnosis not present

## 2020-06-03 DIAGNOSIS — R0602 Shortness of breath: Secondary | ICD-10-CM | POA: Diagnosis not present

## 2020-06-03 DIAGNOSIS — R600 Localized edema: Secondary | ICD-10-CM | POA: Diagnosis not present

## 2020-06-03 DIAGNOSIS — I10 Essential (primary) hypertension: Secondary | ICD-10-CM

## 2020-06-03 LAB — CBC WITH DIFFERENTIAL/PLATELET
Basophils Absolute: 0.1 10*3/uL (ref 0.0–0.1)
Basophils Relative: 1 % (ref 0.0–3.0)
Eosinophils Absolute: 0.1 10*3/uL (ref 0.0–0.7)
Eosinophils Relative: 1.7 % (ref 0.0–5.0)
HCT: 36.7 % (ref 36.0–46.0)
Hemoglobin: 12.1 g/dL (ref 12.0–15.0)
Lymphocytes Relative: 19.5 % (ref 12.0–46.0)
Lymphs Abs: 1.3 10*3/uL (ref 0.7–4.0)
MCHC: 33 g/dL (ref 30.0–36.0)
MCV: 83.7 fl (ref 78.0–100.0)
Monocytes Absolute: 0.5 10*3/uL (ref 0.1–1.0)
Monocytes Relative: 7.8 % (ref 3.0–12.0)
Neutro Abs: 4.8 10*3/uL (ref 1.4–7.7)
Neutrophils Relative %: 70 % (ref 43.0–77.0)
Platelets: 263 10*3/uL (ref 150.0–400.0)
RBC: 4.39 Mil/uL (ref 3.87–5.11)
RDW: 14.7 % (ref 11.5–15.5)
WBC: 6.8 10*3/uL (ref 4.0–10.5)

## 2020-06-03 LAB — COMPREHENSIVE METABOLIC PANEL
ALT: 12 U/L (ref 0–35)
AST: 16 U/L (ref 0–37)
Albumin: 3.9 g/dL (ref 3.5–5.2)
Alkaline Phosphatase: 156 U/L — ABNORMAL HIGH (ref 39–117)
BUN: 19 mg/dL (ref 6–23)
CO2: 29 mEq/L (ref 19–32)
Calcium: 9.4 mg/dL (ref 8.4–10.5)
Chloride: 104 mEq/L (ref 96–112)
Creatinine, Ser: 1.22 mg/dL — ABNORMAL HIGH (ref 0.40–1.20)
GFR: 51.27 mL/min — ABNORMAL LOW (ref >60.00)
Glucose, Bld: 74 mg/dL (ref 70–99)
Potassium: 4.2 mEq/L (ref 3.5–5.1)
Sodium: 140 mEq/L (ref 135–145)
Total Bilirubin: 0.4 mg/dL (ref 0.2–1.2)
Total Protein: 6.7 g/dL (ref 6.0–8.3)

## 2020-06-03 LAB — POC URINALSYSI DIPSTICK (AUTOMATED)
Bilirubin, UA: NEGATIVE
Blood, UA: NEGATIVE
Glucose, UA: NEGATIVE
Ketones, UA: NEGATIVE
Leukocytes, UA: NEGATIVE
Nitrite, UA: NEGATIVE
Protein, UA: POSITIVE — AB
Spec Grav, UA: 1.02 (ref 1.010–1.025)
Urobilinogen, UA: 0.2 E.U./dL
pH, UA: 5 (ref 5.0–8.0)

## 2020-06-03 LAB — TSH: TSH: 1.25 u[IU]/mL (ref 0.35–4.50)

## 2020-06-03 MED ORDER — NEBIVOLOL HCL 2.5 MG PO TABS
2.5000 mg | ORAL_TABLET | Freq: Every day | ORAL | 5 refills | Status: DC
Start: 2020-06-03 — End: 2020-06-18

## 2020-06-03 MED ORDER — AMLODIPINE BESYLATE 5 MG PO TABS
5.0000 mg | ORAL_TABLET | Freq: Every evening | ORAL | 3 refills | Status: DC
Start: 2020-06-03 — End: 2021-06-04

## 2020-06-03 NOTE — Telephone Encounter (Signed)
Patient called and stated dr hunter told her to go to pick up her medication and if it was more than $50 to leave it there she stated it was $132 and would prescribe something else

## 2020-06-03 NOTE — Assessment & Plan Note (Signed)
#  Hypertension  #Bilateral leg edema  S: pt c/o bilateral leg swelling and it is not getting better and she states her BP is running high also. She thinks her meds may need to be adjusted.   Blood pressures at home ranging from the 140s to the 170s over 80s to 90s for the most part-occasionally higher.  This is despite taking amlodipine 5 mg twice daily.  She is trying to eat a healthy diet.  She is remaining active  Feels like she has had more issues in general since march since Covid vaccine-has had issues with fatigue.  Also complains of a chronic cough.  Lab work in early May was reassuring including TSH, CBC, CMP.  Does have slightly elevated alkaline phosphatase though GGT not elevated and echocardiogram reassuring-check due to edema.  She has had an occasional dull headache-she is going to try Tylenol Extra Strength with the next 1-if has worsening pattern will let me know A/P: I am concerned leg edema could be from higher dose of amlodipine-we can reduce to 5 mg at night.  Add Bystolic 2.5 mg at night.  I am hesitant with GFR in the 30s on adding ACE/ARB/diuretic-previously had worsening renal function on hydrochlorothiazide.  Will titrate Bystolic up as able-for now I think her heart rate can handle this -May also use a short course of diuretic Lasix depending on renal function and response to reducing amlodipine -Did have irregular heart rate at first but EKG was reassuring.  Last TSH was okay but we will recheck as well as check CBC and CMP.

## 2020-06-03 NOTE — Addendum Note (Signed)
Addended by: Dyann Kief on: 06/03/2020 02:11 PM   Modules accepted: Orders

## 2020-06-04 MED ORDER — METOPROLOL SUCCINATE ER 25 MG PO TB24: 25.0000 mg | ORAL_TABLET | Freq: Every day | ORAL | 3 refills | Status: DC

## 2020-06-04 NOTE — Telephone Encounter (Signed)
I sent in metoprolol 25 mg extended release to be trialed instead.  If she would like she can try just 1/2 tablet for the first day or 2 to see how she does with the medicine

## 2020-06-04 NOTE — Telephone Encounter (Signed)
Please see message and advise 

## 2020-06-04 NOTE — Telephone Encounter (Signed)
Spoke to pt told her Dr.Hunter has sent in metoprolol 25 mg extended release to be trialed instead.  If she would like she can try just 1/2 tablet for the first day or 2 to see how she does with the medicine. Pt verbalized understanding.

## 2020-06-05 LAB — URINE CULTURE
MICRO NUMBER:: 10652753
SPECIMEN QUALITY:: ADEQUATE

## 2020-06-06 ENCOUNTER — Other Ambulatory Visit: Payer: Self-pay | Admitting: Family Medicine

## 2020-06-06 MED ORDER — CEPHALEXIN 500 MG PO CAPS
500.0000 mg | ORAL_CAPSULE | Freq: Three times a day (TID) | ORAL | 0 refills | Status: AC
Start: 2020-06-06 — End: 2020-06-13

## 2020-06-17 NOTE — Progress Notes (Signed)
Phone [phone] In person visit   Subjective:   Maria [name] is a 79 y.o. year old very pleasant female patient who presents for/with See problem oriented charting Chief Complaint  Patient presents with  . Hypertension   This visit occurred during the SARS-CoV-2 public health emergency.  Safety protocols were in place, including screening questions prior to the visit, additional usage of staff PPE, and extensive cleaning of exam room while observing appropriate contact time as indicated for disinfecting solutions.   Past Medical History-  Patient Active Problem List   Diagnosis Date Noted  . History of gastrointestinal hemorrhage     Priority: High  . Chronic kidney disease (CKD), stage III (moderate) 10/04/2019    Priority: Medium  . GERD (gastroesophageal reflux disease) 05/27/2014    Priority: Medium  . Hyperlipidemia LDL goal <100 08/21/2008    Priority: Medium  . Essential hypertension 08/21/2008    Priority: Medium  . Arthritis of right hip 10/10/2019    Priority: Low  . Bursitis of right hip 10/10/2019    Priority: Low  . Allergic rhinitis 10/03/2019    Priority: Low  . 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 Osteoarthritis of right knee 06/03/2012    Priority: Low  . Aortic atherosclerosis ([place]) 06/18/2020  . Hyperglycemia 04/16/2020    Medications- reviewed and updated Current Outpatient Medications  Medication Sig Dispense Refill  . amLODipine (NORVASC) 5 MG tablet Take 1 tablet (5 mg total) by mouth at bedtime. 90 tablet 3  . atorvastatin (LIPITOR) 40 MG tablet TAKE 1 TABLET(40 MG) BY MOUTH DAILY 90 tablet 1  . Calcium Carbonate-Vitamin D (CALCIUM PLUS VITAMIN D PO) Take 2.5 tablets by mouth daily.     . cyanocobalamin 100 MCG tablet Take 100 mcg by mouth daily.    . fluticasone (FLONASE) 50 MCG/ACT nasal spray SHAKE LIQUID AND USE 2 SPRAYS IN EACH NOSTRIL DAILY 16 g 5  . metoprolol succinate (TOPROL-XL) 25 MG 24 hr tablet Take 1  tablet (25 mg total) by mouth daily. 30 tablet 3  . nebivolol (BYSTOLIC) 2.5 MG tablet Take 1 tablet (2.5 mg total) by mouth daily. 30 tablet 5  . omeprazole (PRILOSEC) 20 MG capsule TAKE 1 CAPSULE(20 MG) BY MOUTH DAILY 90 capsule 1   No current facility-administered medications for this visit.     Objective:  BP 134/72   Pulse 100   Temp 98.3 F (36.8 C) (Temporal)   Ht 5' 1"  (1.549 m)   Wt 151 lb 6.4 oz (68.7 kg)   SpO2 100%   BMI 28.61 kg/m  Gen: NAD, resting comfortably CV: RRR no murmurs rubs or gallops. Not tachycardic on my exam Lungs: CTAB no crackles, wheeze, rhonchi Ext: trace edema left, minimal on right Skin: warm, dry    Assessment and Plan   #Essential hypertension/CKD III/edema S: Compliant with amlodipine 5 mg  daily (edema on 38m). Added metoprolol 25 mg XR  at night last visit- she started with half tablet but now on whole tablet. Swelling has improved some when going down to 1 tab nightly.  Prior was on atenolol-chlorthalidone 50-25 mg but had worsening of renal function and issues maintaining potassium.   At home 140s/70s - had been much higher.   GFR ranging from high 30s to 50s since 2019.  Most recently back over 50 BP Readings from Last 3 Encounters:  06/18/20 134/72  06/03/20 (!) 164/62  04/06/20 138/78   A/P: blood pressure improved on  metoprolol 59m XR along with amlodipine 5 mg. Edema still present (can use compression) but is better.   CKD III-improved on last check. Avoid nsaids - also with GI bleeding risk  #Proteinuria S: Patient with 1+ protein in her urine last visit.  She also had E. coli UTI at that time A/P: We will give additional information with repeat urinalysis now after treatment for UTI-consider microalbumin creatinine ratio if still has proteinuria  #Chronic cough-thought to be allergies by Dr. KCaryl Comes  Flonase did not help.  Never smoker.  Chest x-ray reassuring last visit  #alk phos elevation- see notes last  visit  #occasional chest discomfort after mealsresolves with burping. Could also be GERD- could try tums. If new or worsenign symptoms she will let u[place]oreaknow  Aortic atherosclerosis ([place] S: noted on CXR Lab Results  Component Value Date   CHOL 154 10/03/2019   HDL 50.20 10/03/2019   LRenick96 10/03/2019   LDLDIRECT 100.0 07/23/2018   TRIG 38.0 10/03/2019   CHOLHDL 3 10/03/2019  A/P: continue risk factor modification with new finding of aortic atherosclerosis- at her age I don't feel strong about increasing statin dose even though LDL above 70- will target at least under 70. Glad blood pressure is better.    Recommended follow up: Return in about 3 months (around 09/18/2020). Future Appointments  Date Time Provider DBlackwell 10/12/2020  8:00 AM HMarin Olp MD [facility]   Lab/Order associations:   ICD-10-CM   1. Essential hypertension  I10   2. Stage 3 chronic kidney disease, unspecified whether stage 3a or 3b CKD  N18.30   3. Proteinuria, unspecified type  R80.9 POCT Urinalysis Dipstick (Automated)  4. Aortic atherosclerosis ([place]  I70.0    Return precautions advised.  SGarret Reddish MD

## 2020-06-18 ENCOUNTER — Encounter: Payer: Self-pay | Admitting: Family Medicine

## 2020-06-18 ENCOUNTER — Other Ambulatory Visit: Payer: Self-pay | Admitting: *Deleted

## 2020-06-18 ENCOUNTER — Ambulatory Visit (INDEPENDENT_AMBULATORY_CARE_PROVIDER_SITE_OTHER): Payer: Medicare Other | Admitting: Family Medicine

## 2020-06-18 ENCOUNTER — Other Ambulatory Visit: Payer: Self-pay

## 2020-06-18 VITALS — BP 134/72 | HR 100 | Temp 98.3°F | Ht 61.0 in | Wt 151.4 lb

## 2020-06-18 DIAGNOSIS — I10 Essential (primary) hypertension: Secondary | ICD-10-CM | POA: Diagnosis not present

## 2020-06-18 DIAGNOSIS — R82998 Other abnormal findings in urine: Secondary | ICD-10-CM

## 2020-06-18 DIAGNOSIS — R809 Proteinuria, unspecified: Secondary | ICD-10-CM

## 2020-06-18 DIAGNOSIS — I700 Atherosclerosis of aorta: Secondary | ICD-10-CM

## 2020-06-18 DIAGNOSIS — N183 Chronic kidney disease, stage 3 unspecified: Secondary | ICD-10-CM

## 2020-06-18 LAB — POC URINALSYSI DIPSTICK (AUTOMATED)
Bilirubin, UA: NEGATIVE
Blood, UA: NEGATIVE
Glucose, UA: NEGATIVE
Ketones, UA: NEGATIVE
Nitrite, UA: NEGATIVE
Protein, UA: POSITIVE — AB
Spec Grav, UA: 1.015 (ref 1.010–1.025)
Urobilinogen, UA: NEGATIVE E.U./dL — AB
pH, UA: 5 (ref 5.0–8.0)

## 2020-06-18 LAB — MICROALBUMIN / CREATININE URINE RATIO
Creatinine,U: 122.3 mg/dL
Microalb Creat Ratio: 18.4 mg/g (ref 0.0–30.0)
Microalb, Ur: 22.6 mg/dL — ABNORMAL HIGH (ref 0.0–1.9)

## 2020-06-18 NOTE — Patient Instructions (Addendum)
Urine test today before you leave to check on protein  Blood pressure much better. Ideally would be less than 140/90 but you are close and wanted to continue current dose for now. If still high at follow up consider 37.5mg  or 50mg  of metoprolol at follow up   Glad kidney function looked better on last check  Recommended follow up: Return in about 3 months (around 09/18/2020). unless blood pressure goes up or does not continue to trend up

## 2020-06-18 NOTE — Assessment & Plan Note (Signed)
#  Essential hypertension/CKD III/edema S: Compliant with amlodipine 5 mg  daily (edema on 10mg ). Added metoprolol 25 mg XR  at night last visit- she started with half tablet but now on whole tablet. Swelling has improved some when going down to 1 tab nightly.  Prior was on atenolol-chlorthalidone 50-25 mg but had worsening of renal function and issues maintaining potassium.   At home 140s/70s - had been much higher.   GFR ranging from high 30s to 50s since 2019.  Most recently back over 50 BP Readings from Last 3 Encounters:  06/18/20 140/78  06/03/20 (!) 164/62  04/06/20 138/78   A/P: blood pressure improved on metoproll 25mg  XR along with amlodipine 5 mg. Edema still present (can use compression) but is better.   CKD III-improved on last check. Avoid nsaids

## 2020-06-18 NOTE — Assessment & Plan Note (Signed)
S: noted on CXR Lab Results  Component Value Date   CHOL 154 10/03/2019   HDL 50.20 10/03/2019   LDLCALC 96 10/03/2019   LDLDIRECT 100.0 07/23/2018   TRIG 38.0 10/03/2019   CHOLHDL 3 10/03/2019  A/P: continue risk factor modification with new finding of aortic atherosclerosis- at her age I don't feel strong about increasing statin dose even though LDL above 70- will target at least under 70. Glad blood pressure is better.

## 2020-06-18 NOTE — Addendum Note (Signed)
Addended by: Dyann Kief on: 06/18/2020 02:10 PM   Modules accepted: Orders

## 2020-06-18 NOTE — Addendum Note (Signed)
Addended by: Dyann Kief on: 06/18/2020 02:09 PM   Modules accepted: Orders

## 2020-06-18 NOTE — Addendum Note (Signed)
Addended by: Dyann Kief on: 06/18/2020 10:39 AM   Modules accepted: Orders

## 2020-06-19 LAB — UNLABELED: Test Ordered On Req: 395

## 2020-06-27 LAB — URINE CULTURE
MICRO NUMBER:: 10720900
SPECIMEN QUALITY:: ADEQUATE

## 2020-06-27 LAB — TEST AUTHORIZATION

## 2020-06-27 LAB — PAT ID TIQ DOC

## 2020-06-29 ENCOUNTER — Other Ambulatory Visit: Payer: Self-pay | Admitting: Family Medicine

## 2020-06-29 MED ORDER — CEPHALEXIN 500 MG PO CAPS
500.0000 mg | ORAL_CAPSULE | Freq: Four times a day (QID) | ORAL | 0 refills | Status: AC
Start: 2020-06-29 — End: 2020-07-06

## 2020-07-15 ENCOUNTER — Telehealth: Payer: Self-pay | Admitting: Family Medicine

## 2020-07-15 NOTE — Progress Notes (Signed)
  Chronic Care Management   Note  07/15/2020 Name: Maria [name] MRN: [id number] DOB: [birth year]  Maria [name] is a 79 y.o. year old female who is a primary care patient of Durene Cal, Aldine Contes, MD. I reached out to [employer] by phone today in response to a referral sent by Maria [name]'s PCP, Shelva Majestic, MD.   Maria [name] was given information about Chronic Care Management services today including:  1. CCM service includes personalized support from designated clinical staff supervised by her physician, including individualized plan of care and coordination with other care providers 2. 24/7 contact phone numbers for assistance for urgent and routine care needs. 3. Service will only be billed when office clinical staff spend 20 minutes or more in a month to coordinate care. 4. Only one practitioner may furnish and bill the service in a calendar month. 5. The patient may stop CCM services at any time (effective at the end of the month) by phone call to the office staff.   Patient agreed to services and verbal consent obtained.   Follow up plan:   Lynnae January [employer] Scheduler

## 2020-07-20 ENCOUNTER — Ambulatory Visit (INDEPENDENT_AMBULATORY_CARE_PROVIDER_SITE_OTHER): Payer: Medicare Other

## 2020-07-20 DIAGNOSIS — Z0000 Encounter for general adult medical examination without abnormal findings: Secondary | ICD-10-CM

## 2020-07-20 NOTE — Patient Instructions (Signed)
Maria [name] , Thank you for taking time to come for your Medicare Wellness Visit. I appreciate your ongoing commitment to your health goals. Please review the following plan we discussed and let me know if I can assist you in the future.   Screening recommendations/referrals: Colonoscopy: Done 04/03/07 Mammogram: Done 09/25/19 Bone Density: Done 10/16/18 Recommended yearly ophthalmology/optometry visit for glaucoma screening and checkup Recommended yearly dental visit for hygiene and checkup  Vaccinations: Influenza vaccine: Up to date Pneumococcal vaccine: Up to date Tdap vaccine: Up to date Shingles vaccine: Shingrix discussed. Please contact your pharmacy for coverage information.    Covid-19:Completed 2/27 & 02/08/20  Advanced directives: Please bring a copy of your health care power of attorney and living will to the office at your convenience.  Conditions/risks identified: Lose weight  Next appointment: Follow up in one year for your annual wellness visit    Preventive Care 65 Years and Older, Female Preventive care refers to lifestyle choices and visits with your health care provider that can promote health and wellness. What does preventive care include?  A yearly physical exam. This is also called an annual well check.  Dental exams once or twice a year.  Routine eye exams. Ask your health care provider how often you should have your eyes checked.  Personal lifestyle choices, including:  Daily care of your teeth and gums.  Regular physical activity.  Eating a healthy diet.  Avoiding tobacco and drug use.  Limiting alcohol use.  Practicing safe sex.  Taking low-dose aspirin every day.  Taking vitamin and mineral supplements as recommended by your health care provider. What happens during an annual well check? The services and screenings done by your health care provider during your annual well check will depend on your age, overall health, lifestyle risk  factors, and family history of disease. Counseling  Your health care provider may ask you questions about your:  Alcohol use.  Tobacco use.  Drug use.  Emotional well-being.  Home and relationship well-being.  Sexual activity.  Eating habits.  History of falls.  Memory and ability to understand (cognition).  Work and work Astronomer.  Reproductive health. Screening  You may have the following tests or measurements:  Height, weight, and BMI.  Blood pressure.  Lipid and cholesterol levels. These may be checked every 5 years, or more frequently if you are over 43 years old.  Skin check.  Lung cancer screening. You may have this screening every year starting at age 22 if you have a 30-pack-year history of smoking and currently smoke or have quit within the past 15 years.  Fecal occult blood test (FOBT) of the stool. You may have this test every year starting at age 47.  Flexible sigmoidoscopy or colonoscopy. You may have a sigmoidoscopy every 5 years or a colonoscopy every 10 years starting at age 35.  Hepatitis C blood test.  Hepatitis B blood test.  Sexually transmitted disease (STD) testing.  Diabetes screening. This is done by checking your blood sugar (glucose) after you have not eaten for a while (fasting). You may have this done every 1-3 years.  Bone density scan. This is done to screen for osteoporosis. You may have this done starting at age 35.  Mammogram. This may be done every 1-2 years. Talk to your health care provider about how often you should have regular mammograms. Talk with your health care provider about your test results, treatment options, and if necessary, the need for more tests. Vaccines  Your  health care provider may recommend certain vaccines, such as:  Influenza vaccine. This is recommended every year.  Tetanus, diphtheria, and acellular pertussis (Tdap, Td) vaccine. You may need a Td booster every 10 years.  Zoster vaccine. You  may need this after age 15.  Pneumococcal 13-valent conjugate (PCV13) vaccine. One dose is recommended after age 74.  Pneumococcal polysaccharide (PPSV23) vaccine. One dose is recommended after age 74. Talk to your health care provider about which screenings and vaccines you need and how often you need them. This information is not intended to replace advice given to you by your health care provider. Make sure you discuss any questions you have with your health care provider. Document Released: 12/18/2015 Document Revised: 08/10/2016 Document Reviewed: 09/22/2015 Elsevier Interactive Patient Education  2017 [employer] Prevention in the Home Falls can cause injuries. They can happen to people of all ages. There are many things you can do to make your home safe and to help prevent falls. What can I do on the outside of my home?  Regularly fix the edges of walkways and driveways and fix any cracks.  Remove anything that might make you trip as you walk through a door, such as a raised step or threshold.  Trim any bushes or trees on the path to your home.  Use bright outdoor lighting.  Clear any walking paths of anything that might make someone trip, such as rocks or tools.  Regularly check to see if handrails are loose or broken. Make sure that both sides of any steps have handrails.  Any raised decks and porches should have guardrails on the edges.  Have any leaves, snow, or ice cleared regularly.  Use sand or salt on walking paths during winter.  Clean up any spills in your garage right away. This includes oil or grease spills. What can I do in the bathroom?  Use night lights.  Install grab bars by the toilet and in the tub and shower. Do not use towel bars as grab bars.  Use non-skid mats or decals in the tub or shower.  If you need to sit down in the shower, use a plastic, non-slip stool.  Keep the floor dry. Clean up any water that spills on the floor as soon as  it happens.  Remove soap buildup in the tub or shower regularly.  Attach bath mats securely with double-sided non-slip rug tape.  Do not have throw rugs and other things on the floor that can make you trip. What can I do in the bedroom?  Use night lights.  Make sure that you have a light by your bed that is easy to reach.  Do not use any sheets or blankets that are too big for your bed. They should not hang down onto the floor.  Have a firm chair that has side arms. You can use this for support while you get dressed.  Do not have throw rugs and other things on the floor that can make you trip. What can I do in the kitchen?  Clean up any spills right away.  Avoid walking on wet floors.  Keep items that you use a lot in easy-to-reach places.  If you need to reach something above you, use a strong step stool that has a grab bar.  Keep electrical cords out of the way.  Do not use floor polish or wax that makes floors slippery. If you must use wax, use non-skid floor wax.  Do not  have throw rugs and other things on the floor that can make you trip. What can I do with my stairs?  Do not leave any items on the stairs.  Make sure that there are handrails on both sides of the stairs and use them. Fix handrails that are broken or loose. Make sure that handrails are as long as the stairways.  Check any carpeting to make sure that it is firmly attached to the stairs. Fix any carpet that is loose or worn.  Avoid having throw rugs at the top or bottom of the stairs. If you do have throw rugs, attach them to the floor with carpet tape.  Make sure that you have a light switch at the top of the stairs and the bottom of the stairs. If you do not have them, ask someone to add them for you. What else can I do to help prevent falls?  Wear shoes that:  Do not have high heels.  Have rubber bottoms.  Are comfortable and fit you well.  Are closed at the toe. Do not wear sandals.  If  you use a stepladder:  Make sure that it is fully opened. Do not climb a closed stepladder.  Make sure that both sides of the stepladder are locked into place.  Ask someone to hold it for you, if possible.  Clearly mark and make sure that you can see:  Any grab bars or handrails.  First and last steps.  Where the edge of each step is.  Use tools that help you move around (mobility aids) if they are needed. These include:  Canes.  Walkers.  Scooters.  Crutches.  Turn on the lights when you go into a dark area. Replace any light bulbs as soon as they burn out.  Set up your furniture so you have a clear path. Avoid moving your furniture around.  If any of your floors are uneven, fix them.  If there are any pets around you, be aware of where they are.  Review your medicines with your doctor. Some medicines can make you feel dizzy. This can increase your chance of falling. Ask your doctor what other things that you can do to help prevent falls. This information is not intended to replace advice given to you by your health care provider. Make sure you discuss any questions you have with your health care provider. Document Released: 09/17/2009 Document Revised: 04/28/2016 Document Reviewed: 12/26/2014 Elsevier Interactive Patient Education  2017 [employer].

## 2020-07-20 NOTE — Progress Notes (Signed)
Virtual Visit via Telephone Note  I connected with  Maria [name] on 07/20/20 at  2:30 PM EDT by telephone and verified that I am speaking with the correct person using two identifiers.  Medicare Annual Wellness visit completed telephonically due to Covid-19 pandemic.   Persons participating in this call: This Health Coach and this patient.   Location: Patient: Home Provider: Office   I discussed the limitations, risks, security and privacy concerns of performing an evaluation and management service by telephone and the availability of in person appointments. The patient expressed understanding and agreed to proceed.  Unable to perform video visit due to video visit attempted and failed and/or patient does not have video capability.   Some vital signs may be absent or patient reported.   Marzella Schlein, LPN    Subjective:   Maria [name] is a 79 y.o. female who presents for Medicare Annual (Subsequent) preventive examination.  Review of Systems     Cardiac Risk Factors include: hypertension;advanced age (>58men, >12 women);dyslipidemia     Objective:    There were no vitals filed for this visit. There is no height or weight on file to calculate BMI.  Advanced Directives 07/20/2020 07/17/2019 04/26/2018 10/13/2016 05/06/2014 05/22/2012  Does Patient Have a Medical Advance Directive? Yes Yes Yes Yes Patient has advance directive, copy not in chart Patient does not have advance directive  Type of Advance Directive Healthcare Power of [place];Living will Healthcare Power of [place];Living will - - Living will -  Does patient want to make changes to medical advance directive? - No - Patient declined - - - -  Copy of Healthcare Power of Attorney in Chart? No - copy requested No - copy requested - No - copy requested - -  Pre-existing out of facility DNR order (yellow form or pink MOST form) - - - - No -    Current Medications (verified) Outpatient Encounter Medications as of  07/20/2020  Medication Sig  . amLODipine (NORVASC) 5 MG tablet Take 1 tablet (5 mg total) by mouth at bedtime.  Marland Kitchen atorvastatin (LIPITOR) 40 MG tablet TAKE 1 TABLET(40 MG) BY MOUTH DAILY  . cyanocobalamin 100 MCG tablet Take 100 mcg by mouth daily.  . metoprolol succinate (TOPROL-XL) 25 MG 24 hr tablet Take 1 tablet (25 mg total) by mouth daily.  . Multiple Vitamin (MULTI-DAY PO) Take by mouth. womens multivitiamin gummies 2 a day  . omeprazole (PRILOSEC) 20 MG capsule TAKE 1 CAPSULE(20 MG) BY MOUTH DAILY  . Calcium Carbonate-Vitamin D (CALCIUM PLUS VITAMIN D PO) Take 2.5 tablets by mouth daily.  (Patient not taking: Reported on 07/20/2020)  . [DISCONTINUED] fluticasone (FLONASE) 50 MCG/ACT nasal spray SHAKE LIQUID AND USE 2 SPRAYS IN EACH NOSTRIL DAILY (Patient not taking: Reported on 07/20/2020)   No facility-administered encounter medications on file as of 07/20/2020.    Allergies (verified) Coumadin [warfarin sodium], Methocarbamol, and Norco [hydrocodone-acetaminophen]   History: Past Medical History:  Diagnosis Date  . Arthritis   . Bronchitis    hx of  . GERD (gastroesophageal reflux disease)   . History of bleeding ulcers   . History of gastrointestinal hemorrhage    now off aspirin  . Hyperlipidemia   . Hypertension   . Osteoarthritis of right knee 06/03/2012   Past Surgical History:  Procedure Laterality Date  . ABDOMINAL HYSTERECTOMY     nonmalignant reasons  . APPENDECTOMY    . 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 OSTEONICS  . 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 Congestive Heart Failure Mother        87, only child   . Other Father        was not in patient life   Social History   Socioeconomic History  . Marital status: Divorced    Spouse name: Not on file  . Number of children: Not on file  . Years of education: Not on file  . Highest education level: Not on file  Occupational History  . Occupation: day care   Tobacco Use  . Smoking status: Never Smoker  . Smokeless tobacco: Never Used  Substance and Sexual Activity  . Alcohol use: No  . Drug use: No  . Sexual activity: Not on file  Other Topics Concern  . Not on file  Social History Narrative   Lives alone. Great granddaughter stays with her on weekends.    Divorced 46.       Still doing childcare   Retired from [employer]   Social Determinants of Corporate investment banker Strain: Low Risk   . Difficulty of Paying Living Expenses: Not hard at all  Food Insecurity: No Food Insecurity  . Worried About Programme researcher, broadcasting/film/video in the Last Year: Never true  . Ran Out of Food in the Last Year: Never true  Transportation Needs: No Transportation Needs  . Lack of Transportation (Medical): No  . Lack of Transportation (Non-Medical): No  Physical Activity: Insufficiently Active  . Days of Exercise per Week: 5 days  . Minutes of Exercise per Session: 20 min  Stress: No Stress Concern Present  . Feeling of Stress : Not at all  Social Connections: Moderately Isolated  . Frequency of Communication with Friends and Family: Three times a week  . Frequency of Social Gatherings with Friends and Family: More than three times a week  . Attends Religious Services: Never  . Active Member of Clubs or Organizations: Yes  . Attends Banker Meetings: 1 to 4 times per year  . Marital Status: Divorced    Tobacco Counseling Counseling given: Not Answered   Clinical Intake:  Pre-visit preparation completed: Yes  Pain : No/denies pain     BMI - recorded: 28.62 Nutritional Status: BMI 25 -29 Overweight Nutritional Risks:  None Diabetes: No  How often do you need to have someone help you when you read instructions, pamphlets, or other written materials from your doctor or pharmacy?: 1 - Never  Diabetic?No  Interpreter Needed?: No  Information entered by :: Lanier Ensign,  LPN   Activities of Daily Living In your present state of health, do you have any difficulty performing the following activities: 07/20/2020  Hearing? N  Vision? N  Difficulty concentrating or making decisions? N  Walking or climbing stairs? N  Dressing or bathing? N  Doing errands, shopping? N  Preparing Food and eating ? N  Using the Toilet? N  In the past six months, have you accidently leaked urine? Y  Comment urgency and had UTI  Do you have problems with loss of bowel control? N  Managing your Medications? N  Managing  your Finances? N  Housekeeping or managing your Housekeeping? N  Some recent data might be hidden    Patient Care Team: Shelva Majestic, MD as PCP - General (Family Medicine) Augustin Schooling, MD as Consulting Physician (Ophthalmology) Sheral Apley, MD as Consulting Physician (Orthopedic Surgery) Dahlia Byes, [facility] as Pharmacist (Pharmacist)  Indicate any recent Medical Services you may have received from other than [facility] providers in the past year (date may be approximate).     Assessment:   This is a routine wellness examination for [place].  Hearing/Vision screen  Hearing Screening   125Hz  250Hz  500Hz  1000Hz  2000Hz  3000Hz  4000Hz  6000Hz  8000Hz   Right ear:           Left ear:           Comments: Pt denies any hearing difficulty  Vision Screening Comments: Dr follows up with annual eye exams  Dietary issues and exercise activities discussed: Current Exercise Habits: The patient does not participate in regular exercise at present  Goals    . Patient Stated     Keep living for God and do the best I can    . Patient Stated     Lose weight     . Weight (lb) < 145 lb (65.8 kg)     Works 3  days with children;  Line dancing one day Pick up 2 days of walking Replace one meal with shake  Fat free or low fat dairy products Fish high in omega-3 acids ( salmon, tuna, trout) Fruits, such as apples, bananas, oranges, pears, prunes Legumes, such as kidney beans, lentils, checkpeas, black-eyed peas and lima beans Vegetables; broccoli, cabbage, carrots Whole grains;   Plant fats are better; decrease "white" foods as pasta, rice, bread and desserts, sugar; Avoid red meat (limiting) palm and coconut oils; sugary foods and beverages  Two nutrients that raise blood chol levels are saturated fats and trans fat; in hydrogenated oils and fats, as stick margarine, baked goods (cookes, cakes, pies, crackers; frosting; and coffee creamers;   Some Fats lower cholesterol: Monounsaturated and polyunsaturated  Avocados Corn, sunflower, and soybean oils Nuts and seeds, such as walnuts Olive, canola, peanut, safflower, and sesame oils Peanut butter Salmon and trout Tofu         Depression Screen PHQ 2/9 Scores 07/20/2020 02/13/2020 07/17/2019 02/11/2019 04/26/2018 04/03/2018 10/13/2016  PHQ - 2 Score 0 0 0 0 0 0 0    Fall Risk Fall Risk  07/20/2020 07/20/2020 10/03/2019 07/17/2019 04/26/2018  Falls in the past year? - 0 0 0 No  Number falls in past yr: - 0 0 0 -  Injury with Fall? - 0 0 0 -  Follow up Falls prevention discussed Falls prevention discussed - - -  Comment - - - - -    Any stairs in or around the home? Yes  If so, are there any without handrails? No  Home free of loose throw rugs in walkways, pet beds, electrical cords, etc? Yes  Adequate lighting in your home to reduce risk of falls? Yes   ASSISTIVE DEVICES UTILIZED TO PREVENT FALLS:  Life alert? No  Use of a cane, walker or w/c? No  Grab bars in the bathroom? No  Shower chair or bench in shower? No  Elevated toilet seat or a handicapped toilet? Yes   TIMED UP AND GO:  Was the test performed? No .      Cognitive  Function: MMSE - Mini Mental State Exam 04/26/2018 10/13/2016  Not completed: (No Data) (No Data)     6CIT Screen 07/17/2019 04/26/2018  What Year? 0 points 0 points  What month? 0 points 0 points  What time? 0 points 0 points  Count back from 20 0 points 0 points  Months in reverse 0 points 0 points  Repeat phrase 0 points 0 points  Total Score 0 0    Immunizations Immunization History  Administered Date(s) Administered  . Fluad Quad(high Dose 65+) 10/03/2019  . Influenza Split 09/08/2011, 10/29/2012  . Influenza Whole 08/21/2008, 09/06/2010  . Influenza, High Dose Seasonal PF 09/29/2016, 08/23/2018  . Influenza,inj,Quad PF,6+ Mos 11/07/2013  . PFIZER SARS-COV-2 Vaccination 02/01/2020, 02/08/2020  . PPD Test 03/24/2017, 03/25/2020  . Pneumococcal Conjugate-13 11/07/2013  . Pneumococcal Polysaccharide-23 12/05/2005, 09/08/2011  . Td 12/05/2001  . Tdap 10/29/2012  . Zoster 08/21/2008    TDAP status: Up to date Flu Vaccine status: Up to date Pneumococcal vaccine status: Up to date Covid-19 vaccine status: Completed vaccines  Qualifies for Shingles Vaccine? Yes   Zostavax completed Yes   Shingrix Completed?: No.    Education has been provided regarding the importance of this vaccine. Patient has been advised to call insurance company to determine out of pocket expense if they have not yet received this vaccine. Advised may also receive vaccine at local pharmacy or Health Dept. Verbalized acceptance and understanding.  Screening Tests Health Maintenance  Topic Date Due  . INFLUENZA VACCINE  07/05/2020  . MAMMOGRAM  09/24/2020  . TETANUS/TDAP  10/29/2022  . DEXA SCAN  Completed  . COVID-19 Vaccine  Completed  . PNA vac Low Risk Adult  Completed    Health Maintenance  Health Maintenance Due  Topic Date Due  . INFLUENZA VACCINE  07/05/2020    Colorectal cancer screening: No longer required.  Mammogram status: No longer required.  Bone Density status: Completed  10/16/18. Results reflect: Bone density results: OSTEOPENIA. Repeat every 2 years.    Additional Screening:   Vision Screening: Recommended annual ophthalmology exams for early detection of glaucoma and other disorders of the eye. Is the patient up to date with their annual eye exam?  Yes  Who is the provider or what is the name of the office in which the patient attends annual eye exams? Dr Wynelle Link   Dental Screening: Recommended annual dental exams for proper oral hygiene  Community Resource Referral / Chronic Care Management: CRR required this visit?  No   CCM required this visit?  No      Plan:     I have personally reviewed and noted the following in the patient's chart:   . Medical and social history . Use of alcohol, tobacco or illicit drugs  . Current medications and supplements . Functional ability and status . Nutritional status . Physical activity . Advanced directives . List of other physicians . Hospitalizations, surgeries, and ER visits in previous 12 months . Vitals . Screenings to include cognitive, depression, and falls . Referrals and appointments  In addition, I have reviewed and discussed with patient certain preventive protocols, quality metrics, and best practice recommendations. A written personalized care plan for preventive services as well as general preventive health recommendations were provided to patient.     Marzella Schlein, LPN   [id number]   Nurse Notes: Pt stated that she has finished up UTI med and will call if she needs to. Also She continues to put feet up on pillows related to puffy feet at times.

## 2020-07-22 ENCOUNTER — Other Ambulatory Visit: Payer: Self-pay | Admitting: Family Medicine

## 2020-09-03 ENCOUNTER — Ambulatory Visit: Payer: Self-pay | Admitting: Hematology

## 2020-09-03 NOTE — Telephone Encounter (Signed)
Advised that she may experience some side effects such as: Low grade fever, chills, body aches, N/V/D / Explained the body is reacting to the vaccine and that is what we want to ensure the body is building antibodies etc.  / Patient states she was aware because she had a similar experience when she received her 2nd dose. /

## 2020-09-08 ENCOUNTER — Ambulatory Visit: Payer: Medicare Other | Attending: Internal Medicine

## 2020-09-08 DIAGNOSIS — Z23 Encounter for immunization: Secondary | ICD-10-CM

## 2020-09-08 NOTE — Progress Notes (Signed)
   Covid-19 Vaccination Clinic  Name:  Maria [name]    MRN: [id number] DOB: 04/25/1941  09/08/2020  [name] was observed post Covid-19 immunization for 15 minutes without incident. She was provided with Vaccine Information Sheet and instruction to access the V-Safe system.   [name] was instructed to call 911 with any severe reactions post vaccine: Marland Kitchen Difficulty breathing  . Swelling of face and throat  . A fast heartbeat  . A bad rash all over body  . Dizziness and weakness

## 2020-09-14 ENCOUNTER — Telehealth: Payer: Medicare Other

## 2020-09-18 ENCOUNTER — Other Ambulatory Visit: Payer: Self-pay

## 2020-09-18 ENCOUNTER — Telehealth: Payer: Self-pay

## 2020-09-18 DIAGNOSIS — R350 Frequency of micturition: Secondary | ICD-10-CM

## 2020-09-18 NOTE — Telephone Encounter (Signed)
Pt scheduled  

## 2020-09-18 NOTE — Telephone Encounter (Signed)
Patient would like a call back.  She states she is having frequent urination with no other symptoms.  States she has been drinking a lot of water and believes this is why.   States she wanted to know if Dr. [name] would work her in for next week to be seen.    Patient does not wish to see any other providers.

## 2020-09-18 NOTE — Telephone Encounter (Signed)
Ok to use SDA for next week to work pt in. Will place urine orders.

## 2020-09-21 ENCOUNTER — Ambulatory Visit: Payer: Medicare Other | Admitting: Family Medicine

## 2020-09-22 NOTE — Progress Notes (Signed)
Phone [phone] In person visit   Subjective:   Maria [name] is a 79 y.o. year old very pleasant female patient who presents for/with See problem oriented charting Chief Complaint  Patient presents with  . Urinary Frequency   This visit occurred during the SARS-CoV-2 public health emergency.  Safety protocols were in place, including screening questions prior to the visit, additional usage of staff PPE, and extensive cleaning of exam room while observing appropriate contact time as indicated for disinfecting solutions.   Past Medical History-  Patient Active Problem List   Diagnosis Date Noted  . History of gastrointestinal hemorrhage     Priority: High  . Chronic kidney disease (CKD), stage III (moderate) (HCC) 10/04/2019    Priority: Medium  . GERD (gastroesophageal reflux disease) 05/27/2014    Priority: Medium  . Hyperlipidemia LDL goal <100 08/21/2008    Priority: Medium  . Essential hypertension 08/21/2008    Priority: Medium  . Arthritis of right hip 10/10/2019    Priority: Low  . Bursitis of right hip 10/10/2019    Priority: Low  . Allergic rhinitis 10/03/2019    Priority: Low  . 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 Osteoarthritis of right knee 06/03/2012    Priority: Low  . Aortic atherosclerosis (HCC) 06/18/2020  . Hyperglycemia 04/16/2020    Medications- reviewed and updated Current Outpatient Medications  Medication Sig Dispense Refill  . amLODipine (NORVASC) 5 MG tablet Take 1 tablet (5 mg total) by mouth at bedtime. 90 tablet 3  . atorvastatin (LIPITOR) 40 MG tablet TAKE 1 TABLET(40 MG) BY MOUTH DAILY 90 tablet 1  . Calcium Carbonate-Vitamin D (CALCIUM PLUS VITAMIN D PO) Take 2.5 tablets by mouth daily.     . cyanocobalamin 100 MCG tablet Take 100 mcg by mouth daily.    . metoprolol succinate (TOPROL-XL) 25 MG 24 hr tablet Take 1 tablet (25 mg total) by mouth daily. 30 tablet 3  . Multiple Vitamin (MULTI-DAY PO) Take by mouth.  womens multivitiamin gummies 2 a day    . omeprazole (PRILOSEC) 20 MG capsule TAKE 1 CAPSULE(20 MG) BY MOUTH DAILY 90 capsule 1   No current facility-administered medications for this visit.     Objective:  BP 122/80   Pulse 85   Temp 98.2 F (36.8 C) (Temporal)   Resp 18   Ht 5\' 1"  (1.549 m)   Wt 155 lb (70.3 kg)   SpO2 97%   BMI 29.29 kg/m  Gen: NAD, resting comfortably CV: RRR no murmurs rubs or gallops Lungs: CTAB no crackles, wheeze, rhonchi Abdomen: soft/nontender/nondistended/normal bowel sounds. No rebound or guarding. No suprapubic pain Ext: no edema Skin: warm, dry   Results for orders placed or performed in visit on 09/23/20 (from the past 24 hour(s))  POCT Urinalysis Dipstick (Automated)     Status: Abnormal   Collection Time: 09/23/20 10:44 AM  Result Value Ref Range   Color, UA Yellow    Clarity, UA Clear    Glucose, UA Negative Negative   Bilirubin, UA Negative    Ketones, UA Negative    Spec Grav, UA 1.015 1.010 - 1.025   Blood, UA Negative    pH, UA 6.0 5.0 - 8.0   Protein, UA Positive (A) Negative   Urobilinogen, UA 0.2 0.2 or 1.0 E.U./dL   Nitrite, UA Negative    Leukocytes, UA Negative Negative        Assessment and Plan   #social update- son fractured  his spine- he is staying with him and she is doing a lot of caregiving for him- fixing him 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   #Concern for UTI S: Patients symptoms started in mid october. She feels like urinary urgency has improved in the day. Still having some nocturia.    Complains of dysuria: no; polyuria: improved; nocturia: improved but present; urgency: stable.  Symptoms are slightly improved.  ROS- no fever, chills, nausea, vomiting, flank pain. No blood in urine.  A/P: UA today Will get culture as well. Empiric treatment will hold off unless UA concerning Patient to follow up if new or worsening symptoms or failure to improve.   #Essential hypertension S: Compliant with  amlodipine 5 mg twice daily (edema when increased from 10 mg). Prior was on atenolol-chlorthalidone 50-25 mg but had worsening of renal function and issues maintaining potassium. BP Readings from Last 3 Encounters:  09/23/20 122/80  06/18/20 134/72  06/03/20 (!) 164/62  A/P: excellent control- continue current meds   #microalbumin/cr ratio <30- consider repeat again if protein in urine today  Recommended follow up: keep December physical Future Appointments  Date Time Provider Department Center  11/11/2020  8:40 AM Shelva Majestic, MD [facility]  11/16/2020  9:00 AM [facility] PHARMACIST [facility]  07/26/2021  2:30 PM [facility] HEALTH COACH [facility]    Lab/Order associations:   ICD-10-CM   1. Frequent urination  R35.0 Urine Culture    POCT Urinalysis Dipstick (Automated)  2. Essential hypertension  I10    Return precautions advised.  Tana Conch, MD

## 2020-09-22 NOTE — Patient Instructions (Addendum)
No obvious bladder infection on initial test- will do send out test as well  See you in December! Look forward to it  Health Maintenance Due  Topic Date Due  . INFLUENZA VACCINE In office flu shot today high dose 07/05/2020

## 2020-09-23 ENCOUNTER — Other Ambulatory Visit: Payer: Self-pay

## 2020-09-23 ENCOUNTER — Ambulatory Visit (INDEPENDENT_AMBULATORY_CARE_PROVIDER_SITE_OTHER): Payer: Medicare Other | Admitting: Family Medicine

## 2020-09-23 ENCOUNTER — Encounter: Payer: Self-pay | Admitting: Family Medicine

## 2020-09-23 VITALS — BP 122/80 | HR 85 | Temp 98.2°F | Resp 18 | Ht 61.0 in | Wt 155.0 lb

## 2020-09-23 DIAGNOSIS — R350 Frequency of micturition: Secondary | ICD-10-CM

## 2020-09-23 DIAGNOSIS — I10 Essential (primary) hypertension: Secondary | ICD-10-CM | POA: Diagnosis not present

## 2020-09-23 DIAGNOSIS — Z23 Encounter for immunization: Secondary | ICD-10-CM | POA: Diagnosis not present

## 2020-09-23 LAB — POC URINALSYSI DIPSTICK (AUTOMATED)
Bilirubin, UA: NEGATIVE
Blood, UA: NEGATIVE
Glucose, UA: NEGATIVE
Ketones, UA: NEGATIVE
Leukocytes, UA: NEGATIVE
Nitrite, UA: NEGATIVE
Protein, UA: POSITIVE — AB
Spec Grav, UA: 1.015 (ref 1.010–1.025)
Urobilinogen, UA: 0.2 E.U./dL
pH, UA: 6 (ref 5.0–8.0)

## 2020-09-24 LAB — URINE CULTURE
MICRO NUMBER:: 11096150
SPECIMEN QUALITY:: ADEQUATE

## 2020-10-06 ENCOUNTER — Other Ambulatory Visit: Payer: Self-pay | Admitting: Family Medicine

## 2020-10-12 ENCOUNTER — Encounter: Payer: Medicare Other | Admitting: Family Medicine

## 2020-11-04 DIAGNOSIS — Z1231 Encounter for screening mammogram for malignant neoplasm of breast: Secondary | ICD-10-CM | POA: Diagnosis not present

## 2020-11-11 ENCOUNTER — Other Ambulatory Visit: Payer: Self-pay

## 2020-11-11 ENCOUNTER — Encounter: Payer: Self-pay | Admitting: Family Medicine

## 2020-11-11 ENCOUNTER — Ambulatory Visit (INDEPENDENT_AMBULATORY_CARE_PROVIDER_SITE_OTHER): Payer: Medicare Other | Admitting: Family Medicine

## 2020-11-11 VITALS — BP 140/70 | HR 87 | Temp 98.1°F | Ht 61.0 in | Wt 157.2 lb

## 2020-11-11 DIAGNOSIS — R809 Proteinuria, unspecified: Secondary | ICD-10-CM

## 2020-11-11 DIAGNOSIS — E785 Hyperlipidemia, unspecified: Secondary | ICD-10-CM

## 2020-11-11 DIAGNOSIS — Z0000 Encounter for general adult medical examination without abnormal findings: Secondary | ICD-10-CM

## 2020-11-11 DIAGNOSIS — R739 Hyperglycemia, unspecified: Secondary | ICD-10-CM | POA: Diagnosis not present

## 2020-11-11 DIAGNOSIS — N183 Chronic kidney disease, stage 3 unspecified: Secondary | ICD-10-CM | POA: Diagnosis not present

## 2020-11-11 DIAGNOSIS — I10 Essential (primary) hypertension: Secondary | ICD-10-CM

## 2020-11-11 NOTE — Progress Notes (Signed)
Phone [phone]   Subjective:  Patient presents today for their annual physical. Chief complaint-noted.   See problem oriented charting- ROS- full  review of systems was completed and negative except for: post nasal drip, cough at times related to allergies/post nasal drip, numbness in right hand  The following were reviewed and entered/updated in epic: Past Medical History:  Diagnosis Date  . Arthritis   . Bronchitis    hx of  . GERD (gastroesophageal reflux disease)   . History of bleeding ulcers   . History of gastrointestinal hemorrhage    now off aspirin  . Hyperlipidemia   . Hypertension   . Osteoarthritis of right knee 06/03/2012   Patient Active Problem List   Diagnosis Date Noted  . History of gastrointestinal hemorrhage     Priority: High  . Chronic kidney disease (CKD), stage III (moderate) (HCC) 10/04/2019    Priority: Medium  . GERD (gastroesophageal reflux disease) 05/27/2014    Priority: Medium  . Hyperlipidemia LDL goal <100 08/21/2008    Priority: Medium  . Essential hypertension 08/21/2008    Priority: Medium  . Arthritis of right hip 10/10/2019    Priority: Low  . Bursitis of right hip 10/10/2019    Priority: Low  . Allergic rhinitis 10/03/2019    Priority: Low  . 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 Osteoarthritis of right knee 06/03/2012    Priority: Low  . Aortic atherosclerosis (HCC) 06/18/2020  . Hyperglycemia 04/16/2020   Past Surgical History:  Procedure Laterality Date  . ABDOMINAL HYSTERECTOMY     nonmalignant reasons  . APPENDECTOMY    . 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 OSTEONICS  . 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 Congestive Heart Failure Mother        51, only child  . Other Father        was not in patient life    Medications- reviewed and updated Current Outpatient Medications  Medication Sig Dispense Refill  . amLODipine (NORVASC) 5 MG tablet Take 1 tablet (5 mg total) by mouth at bedtime. 90 tablet 3  . atorvastatin (LIPITOR) 40 MG tablet TAKE 1 TABLET(40 MG) BY MOUTH DAILY 90 tablet 1  . Calcium Carbonate-Vitamin D (CALCIUM PLUS VITAMIN D PO) Take 2.5 tablets by mouth daily.     . cyanocobalamin 100 MCG tablet Take 100 mcg by mouth daily.    . metoprolol succinate (TOPROL-XL) 25 MG 24 hr tablet TAKE 1 TABLET(25 MG) BY MOUTH DAILY 30 tablet 3  . Multiple Vitamin (MULTI-DAY PO) Take by mouth. womens multivitiamin gummies 2 a day    . omeprazole (PRILOSEC) 20 MG capsule TAKE 1 CAPSULE(20 MG) BY MOUTH DAILY 90 capsule 1   No current facility-administered medications for this visit.    Allergies-reviewed and updated Allergies  Allergen Reactions  . Coumadin [Warfarin Sodium]     Ulcers  . Methocarbamol     Hallucinations  . Norco [Hydrocodone-Acetaminophen]     Hallucinations    Social History   Social History Narrative   Lives alone. Great granddaughter stays with her on weekends.  Divorced 18.       Still doing childcare   Retired from [employer]   Objective  Objective:  BP 140/70   Pulse 87   Temp 98.1 F (36.7 C)   Ht 5\' 1"  (1.549 m)   Wt 157 lb 3.2 oz (71.3 kg)   SpO2 97%   BMI 29.70 kg/m  Gen: NAD, resting comfortably HEENT: Mucous membranes are moist. Oropharynx normal Neck: no thyromegaly CV: RRR no murmurs rubs or gallops Lungs: CTAB no crackles, wheeze, rhonchi Abdomen: soft/nontender/nondistended/normal bowel sounds. No rebound or guarding.  Ext: no edema Skin: warm, dry Neuro: grossly normal, moves all extremities, PERRLA   Assessment and Plan   79 y.o. female  presenting for annual physical.  Health Maintenance counseling: 1. Anticipatory guidance: Patient counseled regarding regular dental exams - yes q6 months even with dentures , eye exams - yearly - had cataract surgery,  avoiding smoking and second hand smoke , limiting alcohol to 1 beverage per day-doesn't drink .   2. Risk factor reduction:  Advised patient of need for regular exercise and diet rich and fruits and vegetables to reduce risk of heart attack and stroke. Exercise-active working in daycare - no regular exercise outside of daycare. Diet-  Weight 146 last CPE- she is going to try to reduce portion sizes Wt Readings from Last 3 Encounters:  11/11/20 157 lb 3.2 oz (71.3 kg)  09/23/20 155 lb (70.3 kg)  06/18/20 151 lb 6.4 oz (68.7 kg)  3. Immunizations/screenings/ancillary studies- fully up to date other than shingrix - consider at pharmacy Immunization History  Administered Date(s) Administered  . Fluad Quad(high Dose 65+) 10/03/2019, 09/23/2020  . Influenza Split 09/08/2011, 10/29/2012  . Influenza Whole 08/21/2008, 09/06/2010  . Influenza, High Dose Seasonal PF 09/29/2016, 08/23/2018  . Influenza,inj,Quad PF,6+ Mos 11/07/2013  . PFIZER SARS-COV-2 Vaccination 02/01/2020, 02/08/2020, 09/08/2020  . PPD Test 03/24/2017, 03/25/2020  . Pneumococcal Conjugate-13 11/07/2013  . Pneumococcal Polysaccharide-23 12/05/2005, 09/08/2011  . Td 12/05/2001  . Tdap 10/29/2012  . Zoster 08/21/2008  4. Cervical cancer screening- past age based screening recommendations. No vaginal bleeding 5. Breast cancer screening-   mammogram  Reports just had on monday- she prefers to continue these with last   6. Colon cancer screening -  negative stool cards 2019.past age based screening recommendations 7. Skin cancer screening- lower risk due to melanin content. advised regular sunscreen use. Denies worrisome, changing, or new skin lesions.  8. Birth control/STD check-  not sexually active and  postmenopausal 9. Osteoporosis screening at 87-  osteopenia 2019 with 2022 repeat planned. On calcium and vitamin D -Never smoker  Status of chronic or acute concerns   # alkaline phosphatase slightly high- Vitamin D and GGT ok. Echocardiogram has bene ok with concern fluid overload. Fasting labs today may  Be better - likely no further workup unless further increases.  Lab Results  Component Value Date   ALT 12 06/03/2020   AST 16 06/03/2020   ALKPHOS 156 (H) 06/03/2020   BILITOT 0.4 06/03/2020   # possible protein in urine- will check microalbumin/cr ratio . May need to consider ace-I or ARB if elevated   #occasional right hand numbness- mild intermittent issues. Tinel and phalen test negative. With being so mild intermittent we opted to monitor this  #Essential hypertension #CKD Stage III S: Compliant with amlodipine 5 mg twice daily (edema when increased from 10 mg) and metoprolol 25 mg BID. Prior was on atenolol-chlorthalidone 50-25 mg but had worsening of renal  function and issues maintaining potassium.  Mainly 130s or up to 140 at home- as low as 120 though.   GFR ranging from high 30s to 50s since 2019. Knows to avoid nsaids  BP Readings from Last 3 Encounters:  11/11/20 140/70  09/23/20 122/80  06/18/20 134/72  A/P:  HTN- blood pressure just a hair high styolic but last few #s have looked good and home readings better than reading here- will continue current medicine   From avs "Blood pressure hair high today. Make sure home average remains <135/85 and let me know if staying above that. "  CKD III- hopefully controlled trend cmp today   #Hyperlipidemia/aortic atherosclerosis S: Compliant with atorvastatin 40 mg.  LDL goal at least under 100. Lab Results  Component Value Date   CHOL 154 10/03/2019   HDL 50.20 10/03/2019   LDLCALC 96 10/03/2019   LDLDIRECT 100.0 07/23/2018   TRIG 38.0 10/03/2019   CHOLHDL 3 10/03/2019  A/P:   reasonable control. On high intensity  statin - does not have CV history but want to leave current dose to at least keep LDL below 100-does have aortic atherosclerosis so could push her LDL under 70 but I do not feel strongly about really increasing her dose of medicine for prediabetes risk as well as related to her age  # Hyperglycemia/insulin resistance/prediabetes S:  Medication: none Exercise and diet- see above Lab Results  Component Value Date   HGBA1C 5.9 04/16/2020   HGBA1C at home test 6.8 04/09/2020   A/P: hopefully controlle-d update a1c  #GERD S: Compliant with omeprazole 20 mg.  B12 last checked oct 2020.  -history of severe GI bleed years ago so keeping her on PPI despite CKD III Lab Results  Component Value Date   VITAMINB12 521 04/06/2020   A/P:reasonable control- continue current meds due to prior sever eGi bleed   Recommended follow up: Return in about 6 months (around 05/12/2021) for follow up- or sooner if needed. Future Appointments  Date Time Provider Department Center  11/16/2020  9:00 AM [facility] CCM PHARMACIST [facility]  07/26/2021  2:30 PM [facility] HEALTH COACH [facility]   Lab/Order associations: fasting   ICD-10-CM   1. Preventative health care  Z00.00 CBC With Differential/Platelet    COMPLETE METABOLIC PANEL WITH GFR    Lipid Panel w/reflex Direct LDL    Hemoglobin A1c  2. Proteinuria, unspecified type  R80.9 Microalbumin / creatinine urine ratio  3. Essential hypertension  I10 CBC With Differential/Platelet    COMPLETE METABOLIC PANEL WITH GFR    Lipid Panel w/reflex Direct LDL  4. Stage 3 chronic kidney disease, unspecified whether stage 3a or 3b CKD (HCC)  N18.30 COMPLETE METABOLIC PANEL WITH GFR  5. Hyperlipidemia LDL goal <100  E78.5 CBC With Differential/Platelet    COMPLETE METABOLIC PANEL WITH GFR    Lipid Panel w/reflex Direct LDL  6. Hyperglycemia  R73.9 Hemoglobin A1c    No orders of the defined types were placed in this encounter.   Return precautions advised.   Tana Conch, MD

## 2020-11-11 NOTE — Patient Instructions (Addendum)
Please check with your pharmacy to see if they have the shingrix vaccine. If they do- please get this immunization and update [place] by phone call or mychart with dates you receive the vaccine  Blood pressure hair high today. Make sure home average remains <135/85 and let me know if staying above that.   Lets work on 5 lbs down by 6 month follow up   Please stop by lab before you go If you have mychart- we will send your results within 3 business days of [place] receiving them.  If you do not have mychart- we will call you about results within 5 business days of [place] receiving them.  *please note we are currently using [facility] which has a longer processing time than [place] typically so labs may not come back as quickly as in the past *please also note that you will see labs on mychart as soon as they post. I will later go in and write notes on them- will say "notes from Dr. Durene Cal"

## 2020-11-12 LAB — MICROALBUMIN / CREATININE URINE RATIO
Creatinine, Urine: 64 mg/dL (ref 20–275)
Microalb Creat Ratio: 514 mcg/mg creat — ABNORMAL HIGH (ref <30)
Microalb, Ur: 32.9 mg/dL

## 2020-11-12 LAB — COMPLETE METABOLIC PANEL WITH GFR
AG Ratio: 1.3 (calc) (ref 1.0–2.5)
ALT: 11 U/L (ref 6–29)
AST: 19 U/L (ref 10–35)
Albumin: 3.7 g/dL (ref 3.6–5.1)
Alkaline phosphatase (APISO): 137 U/L (ref 37–153)
BUN/Creatinine Ratio: 13 (calc) (ref 6–22)
BUN: 15 mg/dL (ref 7–25)
CO2: 30 mmol/L (ref 20–32)
Calcium: 9.4 mg/dL (ref 8.6–10.4)
Chloride: 106 mmol/L (ref 98–110)
Creat: 1.18 mg/dL — ABNORMAL HIGH (ref 0.60–0.88)
GFR, Est African American: 50 mL/min/{1.73_m2} — ABNORMAL LOW (ref >=60)
GFR, Est Non African American: 44 mL/min/{1.73_m2} — ABNORMAL LOW (ref >=60)
Globulin: 2.8 g/dL (calc) (ref 1.9–3.7)
Glucose, Bld: 95 mg/dL (ref 65–99)
Potassium: 3.8 mmol/L (ref 3.5–5.3)
Sodium: 142 mmol/L (ref 135–146)
Total Bilirubin: 0.6 mg/dL (ref 0.2–1.2)
Total Protein: 6.5 g/dL (ref 6.1–8.1)

## 2020-11-12 LAB — CBC WITH DIFFERENTIAL/PLATELET
Absolute Monocytes: 448 cells/uL (ref 200–950)
Basophils Absolute: 62 cells/uL (ref 0–200)
Basophils Relative: 1.1 %
Eosinophils Absolute: 151 cells/uL (ref 15–500)
Eosinophils Relative: 2.7 %
HCT: 37.1 % (ref 35.0–45.0)
Hemoglobin: 12.3 g/dL (ref 11.7–15.5)
Lymphs Abs: 1422 cells/uL (ref 850–3900)
MCH: 28.1 pg (ref 27.0–33.0)
MCHC: 33.2 g/dL (ref 32.0–36.0)
MCV: 84.7 fL (ref 80.0–100.0)
MPV: 11.7 fL (ref 7.5–12.5)
Monocytes Relative: 8 %
Neutro Abs: 3517 cells/uL (ref 1500–7800)
Neutrophils Relative %: 62.8 %
Platelets: 218 10*3/uL (ref 140–400)
RBC: 4.38 10*6/uL (ref 3.80–5.10)
RDW: 13.4 % (ref 11.0–15.0)
Total Lymphocyte: 25.4 %
WBC: 5.6 10*3/uL (ref 3.8–10.8)

## 2020-11-12 LAB — HEMOGLOBIN A1C
Hgb A1c MFr Bld: 6.1 % of total Hgb — ABNORMAL HIGH (ref <5.7)
Mean Plasma Glucose: 128 mg/dL
eAG (mmol/L): 7.1 mmol/L

## 2020-11-12 LAB — LIPID PANEL W/REFLEX DIRECT LDL
Cholesterol: 162 mg/dL (ref <200)
HDL: 61 mg/dL (ref >=50)
LDL Cholesterol (Calc): 89 mg/dL (calc)
Non-HDL Cholesterol (Calc): 101 mg/dL (calc) (ref <130)
Total CHOL/HDL Ratio: 2.7 (calc) (ref <5.0)
Triglycerides: 42 mg/dL (ref <150)

## 2020-11-13 NOTE — Progress Notes (Unsigned)
Chronic Care Management Pharmacy  ***Not seen***  Name: Maria [name]     MRN: [id number]     DOB: 1941-07-12  Chief Complaint/ HPI Maria [name], 79 y.o., female, presents for their {initial or follow up:24811::"initial"} CCM visit with the clinical pharmacist {in office or via telephone:24812::"via telephone due to COVID-19 pandemic"}.  PCP : Marin Olp, MD No diagnosis found.  Office Visits:  11/11/2020 (PCP): Team please start valsartan 80 mg daily #30 with 5 refills and schedule follow-up with me in about a month. She can continue her other blood pressure medicines. She can take this before bed.  I also am concerned that her prediabetes could have contributed to leaking protein and I want to be more aggressive with controlling her A1c-please start Metformin 500 mg daily with breakfast #30 with 5 refills and we can check in and see how she is doing in 1 month as well.  Patient Active Problem List   Diagnosis Date Noted  . Aortic atherosclerosis ([place]) 06/18/2020  . Hyperglycemia 04/16/2020  . Arthritis of right hip 10/10/2019  . Bursitis of right hip 10/10/2019  . Chronic kidney disease (CKD), stage III (moderate) ([place]) 10/04/2019  . Allergic rhinitis 10/03/2019  . 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 History of gastrointestinal hemorrhage   . GERD (gastroesophageal reflux disease) 05/27/2014  . Osteoarthritis of right knee 06/03/2012  . Hyperlipidemia LDL goal <100 08/21/2008  . Essential hypertension 08/21/2008   Past Surgical History:  Procedure Laterality Date  . ABDOMINAL HYSTERECTOMY     nonmalignant reasons  . APPENDECTOMY    . 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 OSTEONICS  . 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 Congestive Heart Failure Mother        5, only child  . Other Father        was not in patient life   Allergies  Allergen Reactions  . Coumadin [Warfarin Sodium]     Ulcers  . Methocarbamol     Hallucinations  . Norco [Hydrocodone-Acetaminophen]     Hallucinations   Outpatient Encounter Medications as of 11/16/2020  Medication Sig  . amLODipine (NORVASC) 5 MG tablet Take 1 tablet (5 mg total) by mouth at bedtime.  Marland Kitchen atorvastatin (LIPITOR) 40 MG tablet TAKE 1 TABLET(40 MG) BY MOUTH DAILY  . Calcium Carbonate-Vitamin D (CALCIUM PLUS VITAMIN D PO) Take 2.5 tablets by mouth daily.   . cyanocobalamin 100 MCG tablet Take 100 mcg by mouth daily.  . metoprolol succinate (TOPROL-XL) 25 MG 24 hr tablet TAKE 1 TABLET(25 MG) BY MOUTH DAILY  . Multiple Vitamin (MULTI-DAY PO) Take by mouth. womens multivitiamin gummies 2 a day  . omeprazole (PRILOSEC) 20 MG capsule TAKE 1 CAPSULE(20 MG) BY MOUTH DAILY   No facility-administered encounter medications on file as of 11/16/2020.   Patient Care Team    Relationship Specialty Notifications Start End  Marin Olp, MD PCP - General Family Medicine  09/29/16   Corey Harold, MD Consulting Physician Ophthalmology  07/17/19   Renette Butters, MD Consulting Physician Orthopedic Surgery  10/10/19   Madelin Rear, [facility] Pharmacist  Pharmacist  07/15/20    Comment: [phone]   Current Diagnosis/Assessment: Goals Addressed   None    Hypertension   BP goal <140/90  BP Readings from Last 3 Encounters:  11/11/20 140/70  09/23/20 122/80  06/18/20 134/72    BMP Latest Ref Rng & Units 11/11/2020 06/03/2020 04/06/2020  Glucose 65 - 99 mg/dL 95 74 89  BUN 7 - 25 mg/dL 15 19 27(H)  Creatinine 0.60 - 0.88 mg/dL 1.18(H) 1.22(H) 1.57(H)  BUN/Creat Ratio 6 - 22 (calc) 13 - -  Sodium 135 - 146 mmol/L 142 140 139  Potassium 3.5 - 5.3 mmol/L  3.8 4.2 4.3  Chloride 98 - 110 mmol/L 106 104 103  CO2 20 - 32 mmol/L 30 29 27   Calcium 8.6 - 10.4 mg/dL 9.4 9.4 9.4   Previous medications: atenolol-chlorthalidone 50-25. Patient checks BP at home {CHL HP BP Monitoring Frequency:[phone]}. Recent home readings: ***. Patient is currently ***at goal on the following medications:  . Amlodipine 5 mg once daily at bedtime . Metoprolol succinate 25 mg once daily  . Valsartan 80 mg once daily at bedtime (started 12/8 - spilling protein)  ***Diet and exercise discussed - ***  Plan  ***Continue current medications.  Hyperglycemia   A1c goal < ***%  Lab Results  Component Value Date/Time   HGBA1C 6.1 (H) 11/11/2020 09:39 AM   HGBA1C 5.9 04/16/2020 10:32 AM   HGBA1C 6.8 04/09/2020 12:00 AM   MICROALBUR 32.9 11/11/2020 09:39 AM   MICROALBUR 22.6 (H) 06/18/2020 02:10 PM   GFR 51.27 (L) 06/03/2020 12:12 PM   GFR 38.34 (L) 04/06/2020 08:47 AM   GFR 38.93 (L) 02/13/2020 10:26 AM    Lab Results  Component Value Date   MICRALBCREAT 514 (H) 11/11/2020    Checking BG: {CHL HP Blood Glucose Monitoring Frequency:[phone]}. Recent FBG readings: ***  Previous medications: ***. Patient is currently ***at goal on the following medications:  Marland Kitchen Metformin 500 mg once daily with breakfast (started 11/11/2020)  ***We discussed: diet and exercise extensively. ***  Plan  ***Continue current medications.  Hyperlipidemia   LDL goal < 100 (pre-DM, age)  Lipid Panel     Component Value Date/Time   CHOL 162 11/11/2020 0939   TRIG 42 11/11/2020 0939   HDL 61 11/11/2020 0939   LDLCALC 89 11/11/2020 0939   LDLDIRECT 100.0 07/23/2018 1108    Hepatic Function Latest Ref Rng & Units 11/11/2020 06/03/2020 04/06/2020  Total Protein 6.1 - 8.1 g/dL 6.5 6.7 6.6  Albumin 3.5 - 5.2 g/dL - 3.9 3.8  AST 10 - 35 U/L 19 16 17   ALT 6 - 29 U/L 11 12 10   Alk Phosphatase 39 - 117 U/L - 156(H) 141(H)  Total Bilirubin 0.2 - 1.2 mg/dL 0.6 0.4 0.6  Bilirubin,  Direct 0.0 - 0.3 mg/dL - - -    The ASCVD Risk score Mikey Bussing DC Jr., et al., 2013) failed to calculate for the following reasons:   The 2013 ASCVD risk score is only valid for ages 74 to 55  Previous medications: simvastatin Aortic atherosclerosis, however prediabetes w/ risk of developing DM, age 23 y.o. Patient is currently***at goal the following medications:  . ***  We discussed diet and exercise extensively. ***  Plan  ***Continue current medications.  GERD   Lab Results  Component Value Date   [id number] 371 04/06/2020   ***Patient denies recent acid reflux.  Hx of severe GI bleed. ***NSAID use. Currently controlled on: . Omeprazole 20 mg once daily  ***We  discussed: Avoidance of potential triggers such as alcohol, fatty foods, lying down after eating, and tomato sauce.  Plan   ***Continue current medications.  Osteo***   Last DEXA Scan: ***.  VITD  Date Value Ref Range Status  04/06/2020 44.38 30.00 - 100.00 ng/mL Final    Previous medications: ***. Patient {is;is not an osteoporosis candidate:23886}.  Current medications: . ***  We discussed: {Osteoporosis Counseling:23892}.  Plan  ***Continue current medications.  Depression   PHQ9 SCORE ONLY 11/11/2020 07/20/2020 02/13/2020  PHQ-9 Total Score 0 0 0   Previous medications: ***. ***Denies side effects, reports ongoing benefit of taking medication. ***Patient is currently controlled on the following medications:  . ***  Plan  ***Continue current medications.  Vaccines   Immunization History  Administered Date(s) Administered  . Fluad Quad(high Dose 65+) 10/03/2019, 09/23/2020  . Influenza Split 09/08/2011, 10/29/2012  . Influenza Whole 08/21/2008, 09/06/2010  . Influenza, High Dose Seasonal PF 09/29/2016, 08/23/2018  . Influenza,inj,Quad PF,6+ Mos 11/07/2013  . PFIZER SARS-COV-2 Vaccination 02/01/2020, 02/08/2020, 09/08/2020  . PPD Test 03/24/2017, 03/25/2020  . Pneumococcal Conjugate-13  11/07/2013  . Pneumococcal Polysaccharide-23 12/05/2005, 09/08/2011  . Td 12/05/2001  . Tdap 10/29/2012  . Zoster 08/21/2008   ***Reviewed and discussed patient's vaccination history.    Plan  Recommended patient receive shingrix vaccine in pharmacy***.   Medication Management / Care Coordination   Receives prescription medications from:  [id number] - [address], [place] [address] Phone: [phone] Fax: [phone]  [facility] [place] [place] - [address] [place] [address] Phone: [phone] Fax: [phone]    ***  Plan  {US Pharmacy HFWY:63785}. ___________________________ SDOH (Social Determinants of Health) assessments performed: Yes.  Future Appointments  Date Time Provider Candelaria Arenas  11/16/2020  9:00 AM [facility] CCM PHARMACIST [facility]  05/12/2021  8:20 AM Marin Olp, MD [facility]  07/26/2021  2:30 PM [facility] HEALTH COACH [facility]   Visit follow-up:  . CPA follow-up: ***. Marland Kitchen RPH follow-up: *** month *** visit.  Madelin Rear, Pharm.D., BCGP Clinical Pharmacist [facility] [phone]

## 2020-11-16 ENCOUNTER — Other Ambulatory Visit: Payer: Self-pay

## 2020-11-16 ENCOUNTER — Ambulatory Visit: Payer: Medicare Other

## 2020-11-16 ENCOUNTER — Telehealth: Payer: Self-pay

## 2020-11-16 DIAGNOSIS — I10 Essential (primary) hypertension: Secondary | ICD-10-CM

## 2020-11-16 DIAGNOSIS — R739 Hyperglycemia, unspecified: Secondary | ICD-10-CM

## 2020-11-16 DIAGNOSIS — K219 Gastro-esophageal reflux disease without esophagitis: Secondary | ICD-10-CM

## 2020-11-16 DIAGNOSIS — E785 Hyperlipidemia, unspecified: Secondary | ICD-10-CM

## 2020-11-16 MED ORDER — VALSARTAN 80 MG PO TABS: ORAL_TABLET | ORAL | 5 refills | Status: DC

## 2020-11-16 MED ORDER — METFORMIN HCL 500 MG PO TABS: 500.0000 mg | ORAL_TABLET | Freq: Every day | ORAL | 5 refills | Status: DC

## 2020-11-16 NOTE — Telephone Encounter (Signed)
Patient returned call

## 2020-11-16 NOTE — Chronic Care Management (AMB) (Signed)
  Chronic Care Management   Outreach Note   Name: Maria [name] MRN: [id number] DOB: Oct 17, 1941  Referred by: Shelva Majestic, MD Reason for referral: Telephone Appointment with [facility] Clinical Pharmacist, Dahlia Byes.   An unsuccessful telephone outreach was attempted today. The patient was referred to the pharmacist for assistance with care management and care coordination.   Telephone appointment with clinical pharmacist today (11/16/2020) at 9am. If patient immediately returns call, transfer to [phone]. Otherwise, please provide [phone] to pt to reschedule visit.   Dahlia Byes, Pharm.D., BCGP Clinical Pharmacist [facility] [phone]

## 2020-11-17 NOTE — Telephone Encounter (Signed)
Patient has been made aware about her lab results.

## 2020-11-23 ENCOUNTER — Other Ambulatory Visit: Payer: Self-pay

## 2020-11-23 MED ORDER — OMEPRAZOLE 20 MG PO CPDR
DELAYED_RELEASE_CAPSULE | ORAL | 1 refills | Status: DC
Start: 2020-11-23 — End: 2021-05-26

## 2020-12-15 NOTE — Progress Notes (Signed)
Phone [phone] In person visit   Subjective:   Maria [name] is a 80 y.o. year old very pleasant female patient who presents for/with See problem oriented charting Chief Complaint  Patient presents with  . Hypertension  . Hyperlipidemia  . Gastroesophageal Reflux   This visit occurred during the SARS-CoV-2 public health emergency.  Safety protocols were in place, including screening questions prior to the visit, additional usage of staff PPE, and extensive cleaning of exam room while observing appropriate contact time as indicated for disinfecting solutions.   Past Medical History-  Patient Active Problem List   Diagnosis Date Noted  . History of gastrointestinal hemorrhage     Priority: High  . Microalbuminuria 12/17/2020    Priority: Medium  . Aortic atherosclerosis (HCC) 06/18/2020    Priority: Medium  . Chronic kidney disease (CKD), stage III (moderate) (HCC) 10/04/2019    Priority: Medium  . GERD (gastroesophageal reflux disease) 05/27/2014    Priority: Medium  . Hyperlipidemia LDL goal <100 08/21/2008    Priority: Medium  . Essential hypertension 08/21/2008    Priority: Medium  . Arthritis of right hip 10/10/2019    Priority: Low  . Bursitis of right hip 10/10/2019    Priority: Low  . Allergic rhinitis 10/03/2019    Priority: Low  . 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 Osteoarthritis of right knee 06/03/2012    Priority: Low  . Hyperglycemia 04/16/2020    Medications- reviewed and updated Current Outpatient Medications  Medication Sig Dispense Refill  . amLODipine (NORVASC) 5 MG tablet Take 1 tablet (5 mg total) by mouth at bedtime. 90 tablet 3  . atorvastatin (LIPITOR) 40 MG tablet TAKE 1 TABLET(40 MG) BY MOUTH DAILY 90 tablet 1  . Calcium Carbonate-Vitamin D (CALCIUM PLUS VITAMIN D PO) Take 2.5 tablets by mouth daily.     . cyanocobalamin 100 MCG tablet Take 100 mcg by mouth daily.    . metFORMIN (GLUCOPHAGE) 500 MG tablet Take  1 tablet (500 mg total) by mouth daily with breakfast. 30 tablet 5  . metoprolol succinate (TOPROL-XL) 25 MG 24 hr tablet TAKE 1 TABLET(25 MG) BY MOUTH DAILY 30 tablet 3  . Multiple Vitamin (MULTI-DAY PO) Take by mouth. womens multivitiamin gummies 2 a day    . omeprazole (PRILOSEC) 20 MG capsule TAKE 1 CAPSULE(20 MG) BY MOUTH DAILY 90 capsule 1  . valsartan (DIOVAN) 80 MG tablet Take one tablet 80mg  at bedtime 30 tablet 5   No current facility-administered medications for this visit.     Objective:  BP 138/78   Pulse 92   Temp 97.6 F (36.4 C) (Temporal)   Ht 5\' 1"  (1.549 m)   Wt 152 lb 9.6 oz (69.2 kg)   SpO2 97%   BMI 28.83 kg/m  Gen: NAD, resting comfortably CV: RRR no murmurs rubs or gallops Lungs: CTAB no crackles, wheeze, rhonchi Ext: no edema Skin: warm, dry    Assessment and Plan   #social update- 1 son still with her after car accident- slow process. Other son is dealing with drug addiction- he is being stubborn about rehab- got him into a rehab program but had some legal issues getting him in  #Essential hypertension/CKD stage III with proteinuria with elevated microalbumin to creatinine ratio  S: Compliant with amlodipine 5 mg iin PM(edema when increased from 10 mg)- looks like she may only be taking in am though (she will add pm) and metoprolol 25 mg XR as well  as valsartan 80mg  daily as below - Prior was on atenolol-chlorthalidone 50-25 mg but had worsening of renal function and issues maintaining potassium.  GFR ranging from high 30s to 50s since 2019.  Patient knows to avoid NSAIDs  - Patient had elevated microalbumin to creatinine ratio over 500 ratio November 11, 2020-we started valsartan 80 mg daily  Weight down 5 lbs from December visit BP Readings from Last 3 Encounters:  12/17/20 138/78  11/11/20 140/70  09/23/20 122/80  A/P: Blood pressure-well-controlled but high normal current medication  CKD stage III-hopefully stable daily with valsartan- if  tolerable 30% increase in creatinine we will need to discontinue valsartan and refer to nephrology  Proteinuria- hopefully now well treated on valsartan-consider repeat microalbumin creatinine ratio in the future.  Also treating prediabetes  # Hyperglycemia/insulin resistance/prediabetes S:  Medication: Metformin 500 mg with breakfast started when A1c reached 6.1 and patient also had elevated microalbumin to creatinine ratio  A/P: hopefully improved at next visit- seems to be affecting weight- down 5 lbs- that's reasonable with overweight status   Recommended follow up: Return in about 5 months (around 05/17/2021). Future Appointments  Date Time Provider Department Center  07/26/2021  2:30 PM [facility]   Lab/Order associations:   ICD-10-CM   1. Essential hypertension  I10 Basic metabolic panel  2. Stage 3 chronic kidney disease, unspecified whether stage 3a or 3b CKD (HCC)  N18.30 Basic metabolic panel  3. Microalbuminuria  R80.9    Return precautions advised.  07/28/2021, MD

## 2020-12-15 NOTE — Patient Instructions (Addendum)
Please stop by lab before you go If you have mychart- we will send your results within 3 business days of [place] receiving them.  If you do not have mychart- we will call you about results within 5 business days of [place] receiving them.  *please also note that you will see labs on mychart as soon as they post. I will later go in and write notes on them- will say "notes from Dr. Durene Cal"  No changes in meds unless labs lead [place] to make changes  Return in about 5 months (around 05/17/2021).

## 2020-12-17 ENCOUNTER — Other Ambulatory Visit: Payer: Self-pay

## 2020-12-17 ENCOUNTER — Encounter: Payer: Self-pay | Admitting: Family Medicine

## 2020-12-17 ENCOUNTER — Ambulatory Visit (INDEPENDENT_AMBULATORY_CARE_PROVIDER_SITE_OTHER): Payer: Medicare Other | Admitting: Family Medicine

## 2020-12-17 VITALS — BP 138/78 | HR 92 | Temp 97.6°F | Ht 61.0 in | Wt 152.6 lb

## 2020-12-17 DIAGNOSIS — I10 Essential (primary) hypertension: Secondary | ICD-10-CM | POA: Diagnosis not present

## 2020-12-17 DIAGNOSIS — N183 Chronic kidney disease, stage 3 unspecified: Secondary | ICD-10-CM

## 2020-12-17 DIAGNOSIS — R809 Proteinuria, unspecified: Secondary | ICD-10-CM

## 2020-12-17 DIAGNOSIS — R739 Hyperglycemia, unspecified: Secondary | ICD-10-CM

## 2020-12-17 LAB — BASIC METABOLIC PANEL
BUN: 22 mg/dL (ref 6–23)
CO2: 28 mEq/L (ref 19–32)
Calcium: 9.6 mg/dL (ref 8.4–10.5)
Chloride: 105 mEq/L (ref 96–112)
Creatinine, Ser: 1.37 mg/dL — ABNORMAL HIGH (ref 0.40–1.20)
GFR: 36.38 mL/min — ABNORMAL LOW (ref >60.00)
Glucose, Bld: 91 mg/dL (ref 70–99)
Potassium: 4 mEq/L (ref 3.5–5.1)
Sodium: 138 mEq/L (ref 135–145)

## 2021-01-12 ENCOUNTER — Telehealth: Payer: Self-pay

## 2021-01-12 MED ORDER — METOPROLOL SUCCINATE ER 25 MG PO TB24: ORAL_TABLET | ORAL | 3 refills | Status: DC

## 2021-01-12 MED ORDER — ATORVASTATIN CALCIUM 40 MG PO TABS: 40.0000 mg | ORAL_TABLET | Freq: Every day | ORAL | 1 refills | Status: DC

## 2021-01-12 NOTE — Telephone Encounter (Signed)
..   LAST APPOINTMENT DATE: 12/17/2020   NEXT APPOINTMENT DATE:@7 /12/2020  MEDICATION:metoprolol succinate (TOPROL-XL) 25 MG 24 hr tablet    PHARMACY:[id number] - [place], [place] - [address]

## 2021-01-12 NOTE — Telephone Encounter (Signed)
Rx sent in

## 2021-01-12 NOTE — Telephone Encounter (Signed)
..   LAST APPOINTMENT DATE: 12/17/2020   NEXT APPOINTMENT DATE:@7 /12/2020  MEDICATION:atorvastatin (LIPITOR) 40 MG tablet

## 2021-04-02 ENCOUNTER — Other Ambulatory Visit: Payer: Self-pay | Admitting: Family Medicine

## 2021-05-12 ENCOUNTER — Ambulatory Visit: Payer: Medicare Other | Admitting: Family Medicine

## 2021-05-16 ENCOUNTER — Other Ambulatory Visit: Payer: Self-pay | Admitting: Family Medicine

## 2021-05-26 ENCOUNTER — Other Ambulatory Visit: Payer: Self-pay | Admitting: Family Medicine

## 2021-06-04 ENCOUNTER — Other Ambulatory Visit: Payer: Self-pay

## 2021-06-04 ENCOUNTER — Encounter: Payer: Self-pay | Admitting: Family Medicine

## 2021-06-04 ENCOUNTER — Ambulatory Visit (INDEPENDENT_AMBULATORY_CARE_PROVIDER_SITE_OTHER): Payer: Medicare Other | Admitting: Family Medicine

## 2021-06-04 VITALS — BP 138/84 | HR 94 | Temp 97.7°F | Ht 61.0 in | Wt 146.0 lb

## 2021-06-04 DIAGNOSIS — R739 Hyperglycemia, unspecified: Secondary | ICD-10-CM

## 2021-06-04 DIAGNOSIS — Z79899 Other long term (current) drug therapy: Secondary | ICD-10-CM

## 2021-06-04 DIAGNOSIS — R809 Proteinuria, unspecified: Secondary | ICD-10-CM | POA: Diagnosis not present

## 2021-06-04 DIAGNOSIS — I700 Atherosclerosis of aorta: Secondary | ICD-10-CM | POA: Diagnosis not present

## 2021-06-04 DIAGNOSIS — I10 Essential (primary) hypertension: Secondary | ICD-10-CM

## 2021-06-04 LAB — CBC WITH DIFFERENTIAL/PLATELET
Basophils Absolute: 0.1 10*3/uL (ref 0.0–0.1)
Basophils Relative: 0.8 % (ref 0.0–3.0)
Eosinophils Absolute: 0.2 10*3/uL (ref 0.0–0.7)
Eosinophils Relative: 2.7 % (ref 0.0–5.0)
HCT: 35.9 % — ABNORMAL LOW (ref 36.0–46.0)
Hemoglobin: 11.8 g/dL — ABNORMAL LOW (ref 12.0–15.0)
Lymphocytes Relative: 27 % (ref 12.0–46.0)
Lymphs Abs: 1.9 10*3/uL (ref 0.7–4.0)
MCHC: 32.8 g/dL (ref 30.0–36.0)
MCV: 84.6 fl (ref 78.0–100.0)
Monocytes Absolute: 0.6 10*3/uL (ref 0.1–1.0)
Monocytes Relative: 8.9 % (ref 3.0–12.0)
Neutro Abs: 4.2 10*3/uL (ref 1.4–7.7)
Neutrophils Relative %: 60.6 % (ref 43.0–77.0)
Platelets: 217 10*3/uL (ref 150.0–400.0)
RBC: 4.24 Mil/uL (ref 3.87–5.11)
RDW: 14.4 % (ref 11.5–15.5)
WBC: 6.9 10*3/uL (ref 4.0–10.5)

## 2021-06-04 LAB — VITAMIN B12: Vitamin B-12: 783 pg/mL (ref 211–911)

## 2021-06-04 LAB — COMPREHENSIVE METABOLIC PANEL
ALT: 29 U/L (ref 0–35)
AST: 27 U/L (ref 0–37)
Albumin: 3.7 g/dL (ref 3.5–5.2)
Alkaline Phosphatase: 120 U/L — ABNORMAL HIGH (ref 39–117)
BUN: 18 mg/dL (ref 6–23)
CO2: 31 mEq/L (ref 19–32)
Calcium: 9.6 mg/dL (ref 8.4–10.5)
Chloride: 106 mEq/L (ref 96–112)
Creatinine, Ser: 1.43 mg/dL — ABNORMAL HIGH (ref 0.40–1.20)
GFR: 34.44 mL/min — ABNORMAL LOW (ref >60.00)
Glucose, Bld: 77 mg/dL (ref 70–99)
Potassium: 4 mEq/L (ref 3.5–5.1)
Sodium: 145 mEq/L (ref 135–145)
Total Bilirubin: 0.6 mg/dL (ref 0.2–1.2)
Total Protein: 6.6 g/dL (ref 6.0–8.3)

## 2021-06-04 LAB — MICROALBUMIN / CREATININE URINE RATIO
Creatinine,U: 102.5 mg/dL
Microalb Creat Ratio: 36.2 mg/g — ABNORMAL HIGH (ref 0.0–30.0)
Microalb, Ur: 37.1 mg/dL — ABNORMAL HIGH (ref 0.0–1.9)

## 2021-06-04 LAB — HEMOGLOBIN A1C: Hgb A1c MFr Bld: 6.2 % (ref 4.6–6.5)

## 2021-06-04 MED ORDER — FLUTICASONE PROPIONATE 50 MCG/ACT NA SUSP: 2.0000 | Freq: Every day | NASAL | 3 refills | Status: DC

## 2021-06-04 MED ORDER — AMLODIPINE BESYLATE 2.5 MG PO TABS: 2.5000 mg | ORAL_TABLET | Freq: Every day | ORAL | 3 refills | Status: DC

## 2021-06-04 NOTE — Progress Notes (Addendum)
Phone [phone] In person visit   Subjective:   Maria [name] is a 80 y.o. year old very pleasant female patient who presents for/with See problem oriented charting Chief Complaint  Patient presents with   Hypertension   Hyperglycemia   Fatigue    This visit occurred during the SARS-CoV-2 public health emergency.  Safety protocols were in place, including screening questions prior to the visit, additional usage of staff PPE, and extensive cleaning of exam room while observing appropriate contact time as indicated for disinfecting solutions.   Past Medical History-  Patient Active Problem List   Diagnosis Date Noted   History of gastrointestinal hemorrhage     Priority: High   Microalbuminuria 12/17/2020    Priority: Medium   Aortic atherosclerosis (HCC) 06/18/2020    Priority: Medium   Hyperglycemia 04/16/2020    Priority: Medium   Chronic kidney disease (CKD), stage III (moderate) (HCC) 10/04/2019    Priority: Medium   GERD (gastroesophageal reflux disease) 05/27/2014    Priority: Medium   Hyperlipidemia LDL goal <100 08/21/2008    Priority: Medium   Essential hypertension 08/21/2008    Priority: Medium   Arthritis of right hip 10/10/2019    Priority: Low   Bursitis of right hip 10/10/2019    Priority: Low   Allergic rhinitis 10/03/2019    Priority: Low   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/08/2016    Priority: Low   Osteoarthritis of right knee 06/03/2012    Priority: Low    Medications- reviewed and updated Current Outpatient Medications  Medication Sig Dispense Refill   amLODipine (NORVASC) 2.5 MG tablet Take 1 tablet (2.5 mg total) by mouth daily. 90 tablet 3   atorvastatin (LIPITOR) 40 MG tablet Take 1 tablet (40 mg total) by mouth daily. 90 tablet 1   Calcium Carbonate-Vitamin D (CALCIUM PLUS VITAMIN D PO) Take 2.5 tablets by mouth daily.      cyanocobalamin 100 MCG tablet Take 100 mcg by mouth daily.     metFORMIN (GLUCOPHAGE) 500 MG tablet TAKE 1  TABLET(500 MG) BY MOUTH DAILY WITH BREAKFAST 30 tablet 5   metoprolol succinate (TOPROL-XL) 25 MG 24 hr tablet TAKE 1 TABLET(25 MG) BY MOUTH DAILY 30 tablet 3   Multiple Vitamin (MULTI-DAY PO) Take by mouth. womens multivitiamin gummies 2 a day     omeprazole (PRILOSEC) 20 MG capsule TAKE 1 CAPSULE(20 MG) BY MOUTH DAILY 90 capsule 1   valsartan (DIOVAN) 80 MG tablet TAKE 1 TABLET(80 MG) BY MOUTH AT BEDTIME 30 tablet 5   No current facility-administered medications for this visit.     Objective:  BP 138/84   Pulse 94   Temp 97.7 F (36.5 C) (Temporal)   Ht 5\' 1"  (1.549 m)   Wt 146 lb (66.2 kg)   SpO2 98%   BMI 27.59 kg/m  Gen: NAD, resting comfortably CV: RRR no murmurs rubs or gallops Lungs: CTAB no crackles, wheeze, rhonchi Abdomen: soft/nontender/nondistended Ext: no edema Skin: warm, dry    Assessment and Plan   # Fatigue/chronic cough for years- patient called about 2 months ago with some fatigue- thankfully that has improved. Was having some body aches as well. Cough for a few months but apparently was told had possible asthma in the past. We did a CXR last June for this which was largely reassuring. She has never smoked. Since her fatigue is better- will dfer further imaging unless worsening symptoms.  - patient also has had some SOB over the years- echocardiogram 04/2020  was largely reassuring- diastolic dysfunction and EF 60%. Did have some aortic sclerosis- which could explain her murmur - SOB comes and goes- if has worsening issues we discussed doing cardiology evaluation. No chest pain with episodes - already on PPI, retrial flonase, valsartan should not cause cough, could refer to pulmonology but she declines for now (agrees to consider if still has cough at follow up)   #Essential hypertension/CKD stage III with elevated microalbumin to creatinine ratio S: Compliant with metoprolol 25 mg XR, valsartan 80mg  - Prior was on atenolol-chlorthalidone 50-25 mg but had  worsening of renal function and issues maintaining potassium. - in past was on amlodipine 5 mg in PM (edema on 10mg ) not sure why this fell out of her medicationregimen  -home BP 130s to 140s once noted 150  GFR ranging from high 30s to 50s since 2019 - Patient had elevated microalbumin to creatinine ratio over 500 ratio November 11, 2020-we started valsartan 80 mg daily  BP Readings from Last 3 Encounters:  06/04/21 138/84  12/17/20 138/78  11/11/20 140/70   A/P: BP Reasonable control in office but some poor control at home often into the 140s-she will continue metoprolol and valsartan.  Amlodipine has fallen off her medication list-we will restart but at a lower dose 2.5 mg at night- this was sent to her pharmacy today  CKD III hopefully stable- update cmp  #Hyperlipidemia/aortic atherosclerosis S: Compliant with atorvastatin 40 mg.  LDL goal at least under 100. Lab Results  Component Value Date   CHOL 162 11/11/2020   HDL 61 11/11/2020   LDLCALC 89 11/11/2020   LDLDIRECT 100.0 07/23/2018   TRIG 42 11/11/2020   CHOLHDL 2.7 11/11/2020   A/P:  LDL at least below 100-given her age and prediabetes do not feel strongly about increasing dose of statin to push for LDL under 70 even with aortic atherosclerosis-for now continue current medication  #GERD S: Compliant with omeprazole 20 mg.  B12 last checked may 2021 -history of severe GI bleed years ago so keeping her on PPI despite CKD III  Lab Results  Component Value Date   VITAMINB12 521 04/06/2020   A/P:  Stable. Continue current medications.  Plan on long-term PPI with prior life threatening GI bleed  # Hyperglycemia/insulin resistance/prediabetes S:  Medication: Metformin 500 mg with breakfast started when A1c reached 6.1 and patient also had elevated microalbumin to creatinine ratio Exercise and diet- has done a good job with weight loss down from December 157 to 146 now- she was intentional about improving diet to try to help with  prediabetes  Lab Results  Component Value Date   HGBA1C 6.1 (H) 11/11/2020   HGBA1C 5.9 04/16/2020   HGBA1C 6.8 04/09/2020   A/P: hopefully improved or at least stable especially with weight loss update A1c today. Continue current meds for now   Recommended follow up: Return in about 6 months (around 12/05/2021) for physical or sooner if needed. Future Appointments  Date Time Provider Department Center  07/26/2021  2:30 PM [facility]    Lab/Order associations:   ICD-10-CM   1. Microalbuminuria  R80.9 Microalbumin / creatinine urine ratio    2. Essential hypertension  I10 CBC with Differential/Platelet    Comprehensive metabolic panel    3. Hyperglycemia  R73.9 HgB A1c    4. High risk medication use  Z79.899 B12    5. Aortic atherosclerosis (HCC) Chronic I70.0       Meds ordered this encounter  Medications  amLODipine (NORVASC) 2.5 MG tablet    Sig: Take 1 tablet (2.5 mg total) by mouth daily.    Dispense:  90 tablet    Refill:  3    Return precautions advised.  Tana Conch, MD

## 2021-06-04 NOTE — Patient Instructions (Addendum)
Health Maintenance Due  Topic Date Due   Zoster Vaccines- Shingrix (1 of 2) Please check with your pharmacy to see if they have the shingrix vaccine. If they do- please get this immunization and update [place] by phone call or mychart with dates you receive the vaccine  Never done   Start amlodipine 2.5 mg before bed-update me with blood pressure readings at home in 2 weeks-hopefully home readings are less than 135/85 on average  Start Flonase 2 sprays each nostril in the morning to see if this helps with cough-if does not improve can refer to pulmonology next visit   Please stop by lab before you go If you have mychart- we will send your results within 3 business days of [place] receiving them.  If you do not have mychart- we will call you about results within 5 business days of [place] receiving them.  *please also note that you will see labs on mychart as soon as they post. I will later go in and write notes on them- will say "notes from Dr. Durene Cal"   Recommended follow up: Return in about 6 months (around 12/05/2021) for physical or sooner if needed.

## 2021-06-04 NOTE — Addendum Note (Signed)
Addended by: Shelva Majestic on: 06/04/2021 09:32 AM   Modules accepted: Orders

## 2021-06-10 ENCOUNTER — Telehealth: Payer: Self-pay

## 2021-06-10 ENCOUNTER — Other Ambulatory Visit: Payer: Self-pay

## 2021-06-10 DIAGNOSIS — E611 Iron deficiency: Secondary | ICD-10-CM

## 2021-06-10 NOTE — Telephone Encounter (Signed)
Called and spoke with pt and labs reviewed. 

## 2021-06-10 NOTE — Telephone Encounter (Signed)
Patient is returning a call from the office about lab results.

## 2021-06-24 ENCOUNTER — Other Ambulatory Visit: Payer: Self-pay

## 2021-06-24 ENCOUNTER — Other Ambulatory Visit (INDEPENDENT_AMBULATORY_CARE_PROVIDER_SITE_OTHER): Payer: Medicare Other

## 2021-06-24 DIAGNOSIS — R350 Frequency of micturition: Secondary | ICD-10-CM

## 2021-06-24 DIAGNOSIS — E611 Iron deficiency: Secondary | ICD-10-CM | POA: Diagnosis not present

## 2021-06-24 LAB — CBC WITH DIFFERENTIAL/PLATELET
Basophils Absolute: 0 10*3/uL (ref 0.0–0.1)
Basophils Relative: 0.7 % (ref 0.0–3.0)
Eosinophils Absolute: 0.2 10*3/uL (ref 0.0–0.7)
Eosinophils Relative: 3.9 % (ref 0.0–5.0)
HCT: 36.3 % (ref 36.0–46.0)
Hemoglobin: 12 g/dL (ref 12.0–15.0)
Lymphocytes Relative: 31.9 % (ref 12.0–46.0)
Lymphs Abs: 1.6 10*3/uL (ref 0.7–4.0)
MCHC: 33.1 g/dL (ref 30.0–36.0)
MCV: 84.3 fl (ref 78.0–100.0)
Monocytes Absolute: 0.4 10*3/uL (ref 0.1–1.0)
Monocytes Relative: 7.8 % (ref 3.0–12.0)
Neutro Abs: 2.7 10*3/uL (ref 1.4–7.7)
Neutrophils Relative %: 55.7 % (ref 43.0–77.0)
Platelets: 218 10*3/uL (ref 150.0–400.0)
RBC: 4.3 Mil/uL (ref 3.87–5.11)
RDW: 14.8 % (ref 11.5–15.5)
WBC: 4.9 10*3/uL (ref 4.0–10.5)

## 2021-06-24 LAB — FERRITIN: Ferritin: 90.7 ng/mL (ref 10.0–291.0)

## 2021-06-25 LAB — URINE CULTURE
MICRO NUMBER:: 12147156
SPECIMEN QUALITY:: ADEQUATE

## 2021-07-01 ENCOUNTER — Other Ambulatory Visit: Payer: Self-pay | Admitting: Family Medicine

## 2021-07-26 ENCOUNTER — Ambulatory Visit: Payer: Medicare Other

## 2021-08-16 ENCOUNTER — Ambulatory Visit: Payer: Medicare Other

## 2021-08-23 ENCOUNTER — Other Ambulatory Visit: Payer: Self-pay

## 2021-08-23 ENCOUNTER — Ambulatory Visit (INDEPENDENT_AMBULATORY_CARE_PROVIDER_SITE_OTHER): Payer: Medicare Other

## 2021-08-23 DIAGNOSIS — Z0000 Encounter for general adult medical examination without abnormal findings: Secondary | ICD-10-CM | POA: Diagnosis not present

## 2021-08-23 NOTE — Progress Notes (Addendum)
Virtual Visit via Telephone Note  I connected with  Maria [name] on 08/23/21 at  8:00 AM EDT by telephone and verified that I am speaking with the correct person using two identifiers.  Medicare Annual Wellness visit completed telephonically due to Covid-19 pandemic.   Persons participating in this call: This Health Coach and this patient.    Location: Patient: Home Provider: Lujean Rave   I discussed the limitations, risks, security and privacy concerns of performing an evaluation and management service by telephone and the availability of in person appointments. The patient expressed understanding and agreed to proceed.  Unable to perform video visit due to video visit attempted and failed and/or patient does not have video capability.   Some vital signs may be absent or patient reported.   Marzella Schlein, LPN   Subjective:   Maria [name] is a 80 y.o. female who presents for Medicare Annual (Subsequent) preventive examination.  Review of Systems     Cardiac Risk Factors include: advanced age (>72men, >24 women);hypertension;dyslipidemia     Objective:    There were no vitals filed for this visit. There is no height or weight on file to calculate BMI.  Advanced Directives 08/23/2021 07/20/2020 07/17/2019 04/26/2018 10/13/2016 05/06/2014 05/22/2012  Does Patient Have a Medical Advance Directive? Yes Yes Yes Yes Yes Patient has advance directive, copy not in chart Patient does not have advance directive  Type of Advance Directive - Healthcare Power of [place];Living will Healthcare Power of [place];Living will - - Living will -  Does patient want to make changes to medical advance directive? Yes (MAU/Ambulatory/Procedural Areas - Information given) - No - Patient declined - - - -  Copy of Healthcare Power of Attorney in Chart? - No - copy requested No - copy requested - No - copy requested - -  Pre-existing out of facility DNR order (yellow form or pink MOST form) - - - - - No -     Current Medications (verified) Outpatient Encounter Medications as of 08/23/2021  Medication Sig   amLODipine (NORVASC) 2.5 MG tablet Take 1 tablet (2.5 mg total) by mouth daily.   atorvastatin (LIPITOR) 40 MG tablet TAKE 1 TABLET(40 MG) BY MOUTH DAILY   Calcium Carbonate-Vitamin D (CALCIUM PLUS VITAMIN D PO) Take 2.5 tablets by mouth daily.    cyanocobalamin 100 MCG tablet Take 100 mcg by mouth daily.   Ferrous Gluconate-C-Folic Acid (IRON-C PO) Take by mouth. For women 65 mg   fluticasone (FLONASE) 50 MCG/ACT nasal spray Place 2 sprays into both nostrils daily.   metFORMIN (GLUCOPHAGE) 500 MG tablet TAKE 1 TABLET(500 MG) BY MOUTH DAILY WITH BREAKFAST   metoprolol succinate (TOPROL-XL) 25 MG 24 hr tablet TAKE 1 TABLET(25 MG) BY MOUTH DAILY   Multiple Vitamin (MULTI-DAY PO) Take by mouth. womens multivitiamin gummies 2 a day   omeprazole (PRILOSEC) 20 MG capsule TAKE 1 CAPSULE(20 MG) BY MOUTH DAILY   valsartan (DIOVAN) 80 MG tablet TAKE 1 TABLET(80 MG) BY MOUTH AT BEDTIME   No facility-administered encounter medications on file as of 08/23/2021.    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   Procedure: LEFT TOTAL KNEE ARTHROPLASTY;  Surgeon: Loreta Ave, MD;  Location: [facility] OR;  Service: Orthopedics;  Laterality: Left;   Family History  Problem Relation Age of Onset   Congestive Heart Failure Mother        58, only child   Other Father        was not in patient life   Social History   Socioeconomic History   Marital status: Divorced    Spouse name: Not on file   Number of children: Not on file   Years of education: Not on file   Highest education level: Not on file  Occupational History   Occupation: day care   Tobacco Use   Smoking status: Never   Smokeless tobacco: Never  Substance and Sexual Activity   Alcohol use: No   Drug use: No   Sexual activity: Not on file  Other Topics Concern   Not on file  Social History Narrative   Lives alone. Great granddaughter stays with her on weekends.    Divorced 21.       Still doing childcare   Retired from [employer]   Social Determinants of Corporate investment banker Strain: Low Risk    Difficulty of Paying Living Expenses: Not hard at all  Food Insecurity: No Food Insecurity   Worried About Programme researcher, broadcasting/film/video in the Last Year: Never true   Barista in the Last Year: Never true  Transportation Needs: No Transportation Needs   Lack of Transportation (Medical): No   Lack of Transportation (Non-Medical): No  Physical Activity: Inactive   Days of Exercise per Week: 0 days   Minutes of Exercise per Session: 0 min  Stress: No Stress Concern Present   Feeling of Stress : Not at all  Social Connections: Moderately Isolated   Frequency of Communication with Friends and Family: More than three times a week   Frequency of Social Gatherings with Friends and Family: More than three times a week   Attends Religious Services: More than 4 times per year   Active Member of [employer] or Organizations: No   Attends Engineer, structural: Never   Marital Status: Divorced    Tobacco Counseling Counseling given: Not Answered   Clinical Intake:  Pre-visit  preparation completed: Yes  Pain : No/denies pain     BMI - recorded: 27.6 Nutritional Status: BMI 25 -29 Overweight Nutritional Risks: None Diabetes: No  How often do you need to have someone help you when you read instructions, pamphlets, or other written materials from your doctor or pharmacy?: 1 - Never  Diabetic?No  Interpreter Needed?: No  Information entered by :: Lanier Ensign, LPN   Activities of Daily Living In your present state of health, do you have any difficulty performing the following activities: 08/23/2021  Hearing? N  Vision? N  Difficulty concentrating or making decisions? N  Walking or climbing stairs? N  Dressing or bathing? N  Doing errands, shopping? N  Preparing Food and eating ? N  Using the Toilet? N  In  the past six months, have you accidently leaked urine? N  Do you have problems with loss of bowel control? N  Managing your Medications? N  Managing your Finances? N  Housekeeping or managing your Housekeeping? N  Some recent data might be hidden    Patient Care Team: Shelva Majestic, MD as PCP - General (Family Medicine) Augustin Schooling, MD as Consulting Physician (Ophthalmology) Sheral Apley, MD as Consulting Physician (Orthopedic Surgery) Dahlia Byes, [facility] as Pharmacist (Pharmacist)  Indicate any recent Medical Services you may have received from other than [facility] providers in the past year (date may be approximate).     Assessment:   This is a routine wellness examination for Maria [name].  Hearing/Vision screen Hearing Screening - Comments:: Pt denies any hearing issues  Vision Screening - Comments:: Pt follows up with with Dr Wynelle Link for annual eye exams   Dietary issues and exercise activities discussed: Current Exercise Habits: The patient has a physically strenuous job, but has no regular exercise apart from work.   Goals Addressed             This Visit's Progress    Patient Stated       Lose weight        Depression  Screen PHQ 2/9 Scores 08/23/2021 06/04/2021 11/11/2020 07/20/2020 02/13/2020 07/17/2019 02/11/2019  PHQ - 2 Score 0 0 0 0 0 0 0    Fall Risk Fall Risk  08/23/2021 06/04/2021 07/20/2020 07/20/2020 10/03/2019  Falls in the past year? 0 0 - 0 0  Number falls in past yr: 0 0 - 0 0  Injury with Fall? 0 0 - 0 0  Risk for fall due to : - No Fall Risks - - -  Follow up - Falls evaluation completed Falls prevention discussed Falls prevention discussed -  Comment - - - - -    FALL RISK PREVENTION PERTAINING TO THE HOME:  Any stairs in or around the home? Yes  If so, are there any without handrails? No  Home free of loose throw rugs in walkways, pet beds, electrical cords, etc? Yes  Adequate lighting in your home to reduce risk of falls? Yes   ASSISTIVE DEVICES UTILIZED TO PREVENT FALLS:  Life alert? No  Use of a cane, walker or w/c? No  Grab bars in the bathroom? No  Shower chair or bench in shower? Yes  Elevated toilet seat or a handicapped toilet? No   TIMED UP AND GO:  Was the test performed? No .  Cognitive Function: MMSE - Mini Mental State Exam 04/26/2018 10/13/2016  Not completed: (No Data) (No Data)     6CIT Screen 08/23/2021 07/17/2019 04/26/2018  What Year? 0 points 0 points 0 points  What month? 0 points 0 points 0 points  What time? 0 points 0 points 0 points  Count back from 20 0 points 0 points 0 points  Months in reverse 0 points 0 points 0 points  Repeat phrase 0 points 0 points 0 points  Total Score 0 0 0    Immunizations Immunization History  Administered Date(s) Administered   Fluad Quad(high Dose 65+) 10/03/2019, 09/23/2020   Influenza Split 09/08/2011, 10/29/2012   Influenza Whole 08/21/2008, 09/06/2010   Influenza, High Dose Seasonal PF 09/29/2016, 08/23/2018   Influenza,inj,Quad PF,6+ Mos 11/07/2013   PFIZER(Purple Top)SARS-COV-2 Vaccination 02/01/2020, 02/08/2020, 09/08/2020, 06/01/2021   PPD Test 03/24/2017, 03/25/2020   Pneumococcal Conjugate-13 11/07/2013    Pneumococcal Polysaccharide-23 12/05/2005, 09/08/2011   Td 12/05/2001  Tdap 10/29/2012   Zoster, Live 08/21/2008    TDAP status: Up to date  Flu Vaccine status: Due, Education has been provided regarding the importance of this vaccine. Advised may receive this vaccine at local pharmacy or Health Dept. Aware to provide a copy of the vaccination record if obtained from local pharmacy or Health Dept. Verbalized acceptance and understanding.  Pneumococcal vaccine status: Up to date  Covid-19 vaccine status: Completed vaccines  Qualifies for Shingles Vaccine? Yes   Zostavax completed No   Shingrix Completed?: No.    Education has been provided regarding the importance of this vaccine. Patient has been advised to call insurance company to determine out of pocket expense if they have not yet received this vaccine. Advised may also receive vaccine at local pharmacy or Health Dept. Verbalized acceptance and understanding.  Screening Tests Health Maintenance  Topic Date Due   Zoster Vaccines- Shingrix (1 of 2) Never done   INFLUENZA VACCINE  07/05/2021   COVID-19 Vaccine (5 - Booster) 10/01/2021   MAMMOGRAM  11/04/2021   TETANUS/TDAP  10/29/2022   DEXA SCAN  Completed   HPV VACCINES  Aged Out    Health Maintenance  Health Maintenance Due  Topic Date Due   Zoster Vaccines- Shingrix (1 of 2) Never done   INFLUENZA VACCINE  07/05/2021    Colorectal cancer screening: No longer required.   Mammogram status: Completed 11/04/20. Repeat every year  Bone Density status: Completed 10/16/18. Results reflect: Bone density results: OSTEOPENIA. Repeat every 2 years.   Additional Screening:   Vision Screening: Recommended annual ophthalmology exams for early detection of glaucoma and other disorders of the eye. Is the patient up to date with their annual eye exam?  Yes  Who is the provider or what is the name of the office in which the patient attends annual eye exams? Dr Wynelle Link  If pt is not  established with a provider, would they like to be referred to a provider to establish care? No .   Dental Screening: Recommended annual dental exams for proper oral hygiene  Community Resource Referral / Chronic Care Management: CRR required this visit?  No   CCM required this visit?  No      Plan:     I have personally reviewed and noted the following in the patient's chart:   Medical and social history Use of alcohol, tobacco or illicit drugs  Current medications and supplements including opioid prescriptions.  Functional ability and status Nutritional status Physical activity Advanced directives List of other physicians Hospitalizations, surgeries, and ER visits in previous 12 months Vitals Screenings to include cognitive, depression, and falls Referrals and appointments  In addition, I have reviewed and discussed with patient certain preventive protocols, quality metrics, and best practice recommendations. A written personalized care plan for preventive services as well as general preventive health recommendations were provided to patient.     Marzella Schlein, LPN   [id number]   Nurse Notes: None

## 2021-08-23 NOTE — Patient Instructions (Signed)
[name] , Thank you for taking time to come for your Medicare Wellness Visit. I appreciate your ongoing commitment to your health goals. Please review the following plan we discussed and let me know if I can assist you in the future.   Screening recommendations/referrals: Colonoscopy: No longer required  Mammogram: Done 11/04/20 repeat every year  Bone Density: Done 10/16/18 repeat every 2 years  Recommended yearly ophthalmology/optometry visit for glaucoma screening and checkup Recommended yearly dental visit for hygiene and checkup  Vaccinations: Influenza vaccine: Due  Pneumococcal vaccine: Completed  Tdap vaccine: Done 10/29/12 repeat every 10 years due 10/29/22 Shingles vaccine: Shingrix discussed. Please contact your pharmacy for coverage information.    Covid-19:Completed 2/27, 3/6, 09/08/20 & 06/01/21  Advanced directives: Please bring a copy of your health care power of attorney and living will to the office at your convenience.  Conditions/risks identified: Lose some of this belly weight   Next appointment: Follow up in one year for your annual wellness visit    Preventive Care 65 Years and Older, Female Preventive care refers to lifestyle choices and visits with your health care provider that can promote health and wellness. What does preventive care include? A yearly physical exam. This is also called an annual well check. Dental exams once or twice a year. Routine eye exams. Ask your health care provider how often you should have your eyes checked. Personal lifestyle choices, including: Daily care of your teeth and gums. Regular physical activity. Eating a healthy diet. Avoiding tobacco and drug use. Limiting alcohol use. Practicing safe sex. Taking low-dose aspirin every day. Taking vitamin and mineral supplements as recommended by your health care provider. What happens during an annual well check? The services and screenings done by your health care provider  during your annual well check will depend on your age, overall health, lifestyle risk factors, and family history of disease. Counseling  Your health care provider may ask you questions about your: Alcohol use. Tobacco use. Drug use. Emotional well-being. Home and relationship well-being. Sexual activity. Eating habits. History of falls. Memory and ability to understand (cognition). Work and work Astronomer. Reproductive health. Screening  You may have the following tests or measurements: Height, weight, and BMI. Blood pressure. Lipid and cholesterol levels. These may be checked every 5 years, or more frequently if you are over 81 years old. Skin check. Lung cancer screening. You may have this screening every year starting at age 41 if you have a 30-pack-year history of smoking and currently smoke or have quit within the past 15 years. Fecal occult blood test (FOBT) of the stool. You may have this test every year starting at age 68. Flexible sigmoidoscopy or colonoscopy. You may have a sigmoidoscopy every 5 years or a colonoscopy every 10 years starting at age 110. Hepatitis C blood test. Hepatitis B blood test. Sexually transmitted disease (STD) testing. Diabetes screening. This is done by checking your blood sugar (glucose) after you have not eaten for a while (fasting). You may have this done every 1-3 years. Bone density scan. This is done to screen for osteoporosis. You may have this done starting at age 76. Mammogram. This may be done every 1-2 years. Talk to your health care provider about how often you should have regular mammograms. Talk with your health care provider about your test results, treatment options, and if necessary, the need for more tests. Vaccines  Your health care provider may recommend certain vaccines, such as: Influenza vaccine. This is recommended every  year. Tetanus, diphtheria, and acellular pertussis (Tdap, Td) vaccine. You may need a Td booster every  10 years. Zoster vaccine. You may need this after age 65. Pneumococcal 13-valent conjugate (PCV13) vaccine. One dose is recommended after age 54. Pneumococcal polysaccharide (PPSV23) vaccine. One dose is recommended after age 40. Talk to your health care provider about which screenings and vaccines you need and how often you need them. This information is not intended to replace advice given to you by your health care provider. Make sure you discuss any questions you have with your health care provider. Document Released: 12/18/2015 Document Revised: 08/10/2016 Document Reviewed: 09/22/2015 Elsevier Interactive Patient Education  2017 [place] Prevention in the Home Falls can cause injuries. They can happen to people of all ages. There are many things you can do to make your home safe and to help prevent falls. What can I do on the outside of my home? Regularly fix the edges of walkways and driveways and fix any cracks. Remove anything that might make you trip as you walk through a door, such as a raised step or threshold. Trim any bushes or trees on the path to your home. Use bright outdoor lighting. Clear any walking paths of anything that might make someone trip, such as rocks or tools. Regularly check to see if handrails are loose or broken. Make sure that both sides of any steps have handrails. Any raised decks and porches should have guardrails on the edges. Have any leaves, snow, or ice cleared regularly. Use sand or salt on walking paths during winter. Clean up any spills in your garage right away. This includes oil or grease spills. What can I do in the bathroom? Use night lights. Install grab bars by the toilet and in the tub and shower. Do not use towel bars as grab bars. Use non-skid mats or decals in the tub or shower. If you need to sit down in the shower, use a plastic, non-slip stool. Keep the floor dry. Clean up any water that spills on the floor as soon as it  happens. Remove soap buildup in the tub or shower regularly. Attach bath mats securely with double-sided non-slip rug tape. Do not have throw rugs and other things on the floor that can make you trip. What can I do in the bedroom? Use night lights. Make sure that you have a light by your bed that is easy to reach. Do not use any sheets or blankets that are too big for your bed. They should not hang down onto the floor. Have a firm chair that has side arms. You can use this for support while you get dressed. Do not have throw rugs and other things on the floor that can make you trip. What can I do in the kitchen? Clean up any spills right away. Avoid walking on wet floors. Keep items that you use a lot in easy-to-reach places. If you need to reach something above you, use a strong step stool that has a grab bar. Keep electrical cords out of the way. Do not use floor polish or wax that makes floors slippery. If you must use wax, use non-skid floor wax. Do not have throw rugs and other things on the floor that can make you trip. What can I do with my stairs? Do not leave any items on the stairs. Make sure that there are handrails on both sides of the stairs and use them. Fix handrails that are broken or  loose. Make sure that handrails are as long as the stairways. Check any carpeting to make sure that it is firmly attached to the stairs. Fix any carpet that is loose or worn. Avoid having throw rugs at the top or bottom of the stairs. If you do have throw rugs, attach them to the floor with carpet tape. Make sure that you have a light switch at the top of the stairs and the bottom of the stairs. If you do not have them, ask someone to add them for you. What else can I do to help prevent falls? Wear shoes that: Do not have high heels. Have rubber bottoms. Are comfortable and fit you well. Are closed at the toe. Do not wear sandals. If you use a stepladder: Make sure that it is fully opened.  Do not climb a closed stepladder. Make sure that both sides of the stepladder are locked into place. Ask someone to hold it for you, if possible. Clearly mark and make sure that you can see: Any grab bars or handrails. First and last steps. Where the edge of each step is. Use tools that help you move around (mobility aids) if they are needed. These include: Canes. Walkers. Scooters. Crutches. Turn on the lights when you go into a dark area. Replace any light bulbs as soon as they burn out. Set up your furniture so you have a clear path. Avoid moving your furniture around. If any of your floors are uneven, fix them. If there are any pets around you, be aware of where they are. Review your medicines with your doctor. Some medicines can make you feel dizzy. This can increase your chance of falling. Ask your doctor what other things that you can do to help prevent falls. This information is not intended to replace advice given to you by your health care provider. Make sure you discuss any questions you have with your health care provider. Document Released: 09/17/2009 Document Revised: 04/28/2016 Document Reviewed: 12/26/2014 Elsevier Interactive Patient Education  2017 [employer].

## 2021-09-02 ENCOUNTER — Telehealth: Payer: Self-pay

## 2021-09-02 NOTE — Telephone Encounter (Signed)
FYI

## 2021-09-02 NOTE — Telephone Encounter (Signed)
Pt called regarding dark stools. She stated that she has discussed it will Dr Durene Cal before and he told her that it may be from the iron medication. Pt is scheduled with Alyssa tomorrow. Pt wanted Dr Durene Cal to be aware and wanted to know if she needs to do anything else. Please Advise.

## 2021-09-02 NOTE — Telephone Encounter (Signed)
History of severe GI bleed. Could be from iron but glad she is going to be evaluated tomorrow- really sweet patient.

## 2021-09-03 ENCOUNTER — Encounter: Payer: Self-pay | Admitting: Family Medicine

## 2021-09-03 ENCOUNTER — Encounter: Payer: Self-pay | Admitting: Physician Assistant

## 2021-09-03 ENCOUNTER — Encounter: Payer: Medicare Other | Admitting: Physician Assistant

## 2021-09-03 ENCOUNTER — Telehealth (INDEPENDENT_AMBULATORY_CARE_PROVIDER_SITE_OTHER): Payer: Medicare Other | Admitting: Family Medicine

## 2021-09-03 ENCOUNTER — Other Ambulatory Visit: Payer: Self-pay

## 2021-09-03 VITALS — BP 142/78 | HR 101 | Temp 98.0°F | Ht 61.0 in | Wt 147.0 lb

## 2021-09-03 DIAGNOSIS — I10 Essential (primary) hypertension: Secondary | ICD-10-CM | POA: Diagnosis not present

## 2021-09-03 DIAGNOSIS — R195 Other fecal abnormalities: Secondary | ICD-10-CM

## 2021-09-03 DIAGNOSIS — U071 COVID-19: Secondary | ICD-10-CM | POA: Diagnosis not present

## 2021-09-03 MED ORDER — MOLNUPIRAVIR EUA 200MG CAPSULE: 4.0000 | ORAL_CAPSULE | Freq: Two times a day (BID) | ORAL | 0 refills | Status: AC
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
## 2021-09-05 NOTE — Progress Notes (Signed)
Erroneous encounter

## 2021-10-07 ENCOUNTER — Encounter: Payer: Self-pay | Admitting: Family Medicine

## 2021-10-07 ENCOUNTER — Other Ambulatory Visit: Payer: Self-pay

## 2021-10-07 ENCOUNTER — Ambulatory Visit (INDEPENDENT_AMBULATORY_CARE_PROVIDER_SITE_OTHER): Payer: Medicare Other | Admitting: Family Medicine

## 2021-10-07 VITALS — BP 155/84 | HR 104 | Temp 98.1°F | Wt 146.6 lb

## 2021-10-07 DIAGNOSIS — I10 Essential (primary) hypertension: Secondary | ICD-10-CM | POA: Diagnosis not present

## 2021-10-07 DIAGNOSIS — J069 Acute upper respiratory infection, unspecified: Secondary | ICD-10-CM

## 2021-10-07 MED ORDER — BENZONATATE 100 MG PO CAPS: 100.0000 mg | ORAL_CAPSULE | Freq: Two times a day (BID) | ORAL | 0 refills | Status: DC | PRN

## 2021-10-07 NOTE — Progress Notes (Signed)
Phone [phone] In person visit   Subjective:   Maria [name] is a 80 y.o. year old very pleasant female patient who presents for/with See problem oriented charting Chief Complaint  Patient presents with   Cough    COVID home test negative Had PCR yesterday, waiting on results Congestion and lose of voice    Sore Throat   This visit occurred during the SARS-CoV-2 public health emergency.  Safety protocols were in place, including screening questions prior to the visit, additional usage of staff PPE, and extensive cleaning of exam room while observing appropriate contact time as indicated for disinfecting solutions.   Past Medical History-  Patient Active Problem List   Diagnosis Date Noted   History of gastrointestinal hemorrhage     Priority: High   Microalbuminuria 12/17/2020    Priority: Medium    Aortic atherosclerosis (HCC) 06/18/2020    Priority: Medium    Hyperglycemia 04/16/2020    Priority: Medium    Chronic kidney disease (CKD), stage III (moderate) (HCC) 10/04/2019    Priority: Medium    GERD (gastroesophageal reflux disease) 05/27/2014    Priority: Medium    Hyperlipidemia LDL goal <100 08/21/2008    Priority: Medium    Essential hypertension 08/21/2008    Priority: Medium    Arthritis of right hip 10/10/2019    Priority: Low   Bursitis of right hip 10/10/2019    Priority: Low   Allergic rhinitis 10/03/2019    Priority: Low   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/08/2016    Priority: Low   Osteoarthritis of right knee 06/03/2012    Priority: Low    Medications- reviewed and updated Current Outpatient Medications  Medication Sig Dispense Refill   amLODipine (NORVASC) 2.5 MG tablet Take 1 tablet (2.5 mg total) by mouth daily. 90 tablet 3   atorvastatin (LIPITOR) 40 MG tablet TAKE 1 TABLET(40 MG) BY MOUTH DAILY 90 tablet 1   benzonatate (TESSALON) 100 MG capsule Take 1 capsule (100 mg total) by mouth 2 (two) times daily as needed for cough. 20 capsule 0    Calcium Carbonate-Vitamin D (CALCIUM PLUS VITAMIN D PO) Take 2.5 tablets by mouth daily.      cyanocobalamin 100 MCG tablet Take 100 mcg by mouth daily.     Ferrous Gluconate-C-Folic Acid (IRON-C PO) Take by mouth. For women 65 mg     fluticasone (FLONASE) 50 MCG/ACT nasal spray Place 2 sprays into both nostrils daily. 16 g 3   metFORMIN (GLUCOPHAGE) 500 MG tablet TAKE 1 TABLET(500 MG) BY MOUTH DAILY WITH BREAKFAST 30 tablet 5   metoprolol succinate (TOPROL-XL) 25 MG 24 hr tablet TAKE 1 TABLET(25 MG) BY MOUTH DAILY 30 tablet 5   Multiple Vitamin (MULTI-DAY PO) Take by mouth. womens multivitiamin gummies 2 a day     omeprazole (PRILOSEC) 20 MG capsule TAKE 1 CAPSULE(20 MG) BY MOUTH DAILY 90 capsule 1   valsartan (DIOVAN) 80 MG tablet TAKE 1 TABLET(80 MG) BY MOUTH AT BEDTIME 30 tablet 5   No current facility-administered medications for this visit.     Objective:  BP (!) 155/84   Pulse (!) 104   Temp 98.1 F (36.7 C) (Temporal)   Wt 146 lb 9.6 oz (66.5 kg)   SpO2 98%   BMI 27.70 kg/m  Gen: NAD, resting comfortably No sinus pressure, nasal turbinates erythematous and swollen, pharynx mildly erythematous without obvious tonsillar enlargement, tympanic membranes normal bilaterally.  No visible cervical lymphadenopathy.  Dry mucous membranes CV: Mildly tachycardic rate  regular no murmurs rubs or gallops Lungs: CTAB no crackles, wheeze, rhonchi Ext: no edema Skin: warm, dry     Assessment and Plan  # Cough/hoarseness/sore throat S: symptoms started Monday evening with cough. Some discomfort in right throat. Hoarseness has been noted. No fever or chills. No new body aches. No shortness of breath. 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 No chest congestion.  -home covid test negative yesterday. Has already taken PCR waiting on results.  We thought she had covid last month but she states that test ended up being negative (had been reported as positive at time of  visit) A/P: Patient currently on day 4 of symptoms of cough/hoarseness/sore throat. - wants to be tested for flu and strep (has already been vaccinated) -Home COVID test negative and PCR pending-she will let us know if this is positive -I explained at present the most likely diagnosis is viral upper respiratory infection-of course COVID 19 could be a potential cause of such an infection.  We discussed symptomatic care with Tessalon Perles for cough if cough drops or not adequate.  Advised pushing hydration aggressively -Heart rate slightly elevated but patient appears mildly dehydrated-encouraged her to push fluids while ill  #Essential hypertension/CKD stage III with elevated microalbumin to creatinine ratio S: Compliant with amlodipine 2.5 mg in PM (edema when increased from 10 mg or even 5 mg), metoprolol 25 mg XR, valsartan 80mg  - Prior was on atenolol-chlorthalidone 50-25 mg but had worsening of renal function and issues maintaining potassium.  GFR ranging from high 30s to 50s since 2019 - Patient had elevated microalbumin to creatinine ratio over 500 ratio November 11, 2020-we started valsartan 80 mg daily BP Readings from Last 3 Encounters:  10/07/21 (!) 155/84  09/03/21 (!) 142/78  06/04/21 138/84  A/P: Blood pressure is elevated today but patient feels poorly overall.  We will monitor for now with current medication and if blood pressure does not trend back down-could certainly make further adjustments -CKD stage III stable on most recent labs  Recommended follow up: Return for as needed for new, worsening, persistent symptoms. Future Appointments  Date Time Provider [place]  12/17/2021  9:20 AM Marin Olp, MD [facility]  09/02/2022  8:00 AM [facility] HEALTH COACH [facility]    Lab/Order associations:   ICD-10-CM   1. Viral URI  J06.9     2. Essential hypertension  I10       Meds ordered this encounter  Medications   benzonatate (TESSALON) 100 MG capsule     Sig: Take 1 capsule (100 mg total) by mouth 2 (two) times daily as needed for cough.    Dispense:  20 capsule    Refill:  0    I,Harris Phan,acting as a scribe for Garret Reddish, MD.,have documented all relevant documentation on the behalf of Garret Reddish, MD,as directed by  Garret Reddish, MD while in the presence of Garret Reddish, MD.    I, Garret Reddish, MD, have reviewed all documentation for this visit. The documentation on 10/07/21 for the exam, diagnosis, procedures, and orders are all accurate and complete.      Return precautions advised.  Garret Reddish, MD

## 2021-10-07 NOTE — Patient Instructions (Addendum)
Health Maintenance Due  Topic Date Due   Zoster Vaccines- Shingrix (1 of 2) -Please check with your pharmacy to see if they have the shingrix vaccine. If they do- please get this immunization and update [place] by phone call or mychart with dates you receive the vaccine  Never done   COVID-19 Vaccine (5 - Booster) - Recommend on getting the Omicron/Bivalent booster only at your local pharmacy after you clear up! Please let us know when you have received this vaccination.  07/27/2021   Once the results to your PCR COVID-1 test comes back, please let me know!  Try to continue to push fluids daily to help you stay well hydrated   Flu and strep test negative  Recommend that you stay out of work and get some rest Cassie needs to make sure COVID test is negative  If you start to have any shortness of breath or any new/worsening symptoms, please let me know  You may take extra strength Tylenol up to tree times a day for throat discomfort.  Try Tessalon Perles if cough drops do not provide enough relief  Recommended follow up: Return for as needed for new, worsening, persistent symptoms.

## 2021-10-18 ENCOUNTER — Telehealth: Payer: Self-pay

## 2021-10-18 NOTE — Telephone Encounter (Signed)
Noted  

## 2021-10-18 NOTE — Telephone Encounter (Signed)
Patient wanted to let Dr.Hunter know that her covid test was negative, feeling much better and has returned back to work .

## 2021-11-10 DIAGNOSIS — Z1231 Encounter for screening mammogram for malignant neoplasm of breast: Secondary | ICD-10-CM | POA: Diagnosis not present

## 2021-11-10 LAB — HM MAMMOGRAPHY

## 2021-11-30 ENCOUNTER — Other Ambulatory Visit: Payer: Self-pay | Admitting: Family Medicine

## 2021-11-30 NOTE — Telephone Encounter (Signed)
Patient called in stating she is out of this medication.

## 2021-12-01 ENCOUNTER — Other Ambulatory Visit: Payer: Self-pay

## 2021-12-01 ENCOUNTER — Other Ambulatory Visit: Payer: Self-pay | Admitting: Family Medicine

## 2021-12-01 ENCOUNTER — Encounter: Payer: Self-pay | Admitting: Family Medicine

## 2021-12-01 ENCOUNTER — Ambulatory Visit (INDEPENDENT_AMBULATORY_CARE_PROVIDER_SITE_OTHER): Payer: Medicare Other | Admitting: Family Medicine

## 2021-12-01 VITALS — BP 140/80 | HR 104 | Temp 97.6°F | Ht 64.0 in | Wt 145.1 lb

## 2021-12-01 DIAGNOSIS — J40 Bronchitis, not specified as acute or chronic: Secondary | ICD-10-CM | POA: Diagnosis not present

## 2021-12-01 MED ORDER — AMOXICILLIN-POT CLAVULANATE 875-125 MG PO TABS: 1.0000 | ORAL_TABLET | Freq: Two times a day (BID) | ORAL | 0 refills | Status: DC

## 2021-12-01 MED ORDER — ALBUTEROL SULFATE HFA 108 (90 BASE) MCG/ACT IN AERS: 2.0000 | INHALATION_SPRAY | Freq: Four times a day (QID) | RESPIRATORY_TRACT | 0 refills | Status: DC | PRN

## 2021-12-01 MED ORDER — PREDNISONE 20 MG PO TABS: 40.0000 mg | ORAL_TABLET | Freq: Every day | ORAL | 0 refills | Status: AC

## 2021-12-01 NOTE — Patient Instructions (Signed)
Meds have been sent the the pharmacy °You can take tylenol for pain/fevers °If worsening symptoms, let us know or go to the Emergency room  ° ° °

## 2021-12-01 NOTE — Progress Notes (Signed)
Subjective:     Patient ID: Maria [name], female    DOB: 1940/12/31, 80 y.o.   MRN: [id number]  Chief Complaint  Patient presents with   Nasal Congestion   Cough    Off and on Clear phlegm    Shortness of Breath    Think it is due to all the mucus she has     HPI Neg covid yesterday Long hx of intermitt cough/congestion-told poss "little asthma" Coughing up white phlegm for 1 mo and thick.  Lately, getting more sob "from phlegm"..  Works in child care. 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 No f/c No vomit.  Diarrhea last wk.    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 90 tablet 3   atorvastatin (LIPITOR) 40 MG tablet TAKE 1 TABLET(40 MG) BY MOUTH DAILY 90 tablet 1   Calcium Carbonate-Vitamin D (CALCIUM PLUS VITAMIN D PO) Take 2.5 tablets by mouth daily.      cyanocobalamin 100 MCG tablet Take 100 mcg by mouth daily.     Ferrous Gluconate-C-Folic Acid (IRON-C PO) Take by mouth. For  women 65 mg     fluticasone (FLONASE) 50 MCG/ACT nasal spray Place 2 sprays into both nostrils daily. 16 g 3   metFORMIN (GLUCOPHAGE) 500 MG tablet TAKE 1 TABLET(500 MG) BY MOUTH DAILY WITH BREAKFAST 30 tablet 5   metoprolol succinate (TOPROL-XL) 25 MG 24 hr tablet TAKE 1 TABLET(25 MG) BY MOUTH DAILY 30 tablet 5   Multiple Vitamin (MULTI-DAY PO) Take by mouth. womens multivitiamin gummies 2 a day     omeprazole (PRILOSEC) 20 MG capsule TAKE 1 CAPSULE(20 MG) BY MOUTH DAILY 90 capsule 1   valsartan (DIOVAN) 80 MG tablet TAKE 1 TABLET(80 MG) BY MOUTH AT BEDTIME 90 tablet 0   benzonatate (TESSALON) 100 MG capsule Take 1 capsule (100 mg total) by mouth 2 (two) times daily as needed for cough. (Patient not taking: Reported on 12/01/2021) 20 capsule 0   No facility-administered medications prior to visit.    Allergies  Allergen Reactions   Coumadin [Warfarin Sodium]     Ulcers   Methocarbamol     Hallucinations   Norco [Hydrocodone-Acetaminophen]     Hallucinations   CXK:GYJEHUDJ/SHFWYOVZCHYIFOY except as noted in HPI      Objective:     BP 140/80    Pulse (!) 104  Temp 97.6 F (36.4 C) (Temporal)    Ht 5\' 4"  (1.626 m)    Wt 145 lb 2 oz (65.8 kg)    SpO2 97%    BMI 24.91 kg/m  Wt Readings from Last 3 Encounters:  12/01/21 145 lb 2 oz (65.8 kg)  10/07/21 146 lb 9.6 oz (66.5 kg)  09/03/21 147 lb (66.7 kg)        Gen: WDWN NAD very pleasant AAF HEENT: NCAT, conjunctiva not injected, sclera nonicteric TM WNL B, OP moist, no exudates  congested NECK:  supple, no thyromegaly, no nodes, no carotid bruits CARDIAC: tachy RRR, S1S2+, no murmur.  LUNGS: CTAB. No wheezes but distant breath sounds EXT:  no edema MSK: no gross abnormalities.  NEURO: A&O x3.  CN II-XII intact.  PSYCH: normal mood. Good eye contact  Assessment & Plan:   Problem List Items Addressed This Visit   None Visit Diagnoses     Bronchitis    -  Primary      Bronchitis/URI.  Prednisone,  albuterol(discussed how to use), augmentin.  Worse, no change, f/u Sugars well controlled.  No orders of the defined types were placed in this encounter.   09/05/21., MD

## 2021-12-15 NOTE — Progress Notes (Signed)
Phone [phone]   Subjective:  Patient presents today for their annual physical. Chief complaint-noted.   See problem oriented charting- ROS- full  review of systems was completed and negative except for: congestion, post nasal drip, sneezing, chest tightness and wheezing which albuterol relieves, cough, leg swelling at times but stable, neck ain and stiffness, stable numbness in hands  The following were reviewed and entered/updated in epic: Past Medical History:  Diagnosis Date   Arthritis    Bronchitis    hx of   GERD (gastroesophageal reflux disease)    History of bleeding ulcers    History of gastrointestinal hemorrhage    now off aspirin   Hyperlipidemia    Hypertension    Osteoarthritis of right knee 06/03/2012   Patient Active Problem List   Diagnosis Date Noted   History of gastrointestinal hemorrhage     Priority: High   Microalbuminuria 12/17/2020    Priority: Medium    Aortic atherosclerosis (HCC) 06/18/2020    Priority: Medium    Hyperglycemia 04/16/2020    Priority: Medium    Chronic kidney disease (CKD), stage III (moderate) (HCC) 10/04/2019    Priority: Medium    GERD (gastroesophageal reflux disease) 05/27/2014    Priority: Medium    Hyperlipidemia LDL goal <100 08/21/2008    Priority: Medium    Essential hypertension 08/21/2008    Priority: Medium    Arthritis of right hip 10/10/2019    Priority: Low   Bursitis of right hip 10/10/2019    Priority: Low   Allergic rhinitis 10/03/2019    Priority: Low   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/08/2016    Priority: Low   Osteoarthritis of right knee 06/03/2012    Priority: Low   Past Surgical History:  Procedure Laterality Date   ABDOMINAL HYSTERECTOMY     nonmalignant reasons   APPENDECTOMY     TOTAL KNEE ARTHROPLASTY  05/30/2012   Procedure: TOTAL KNEE ARTHROPLASTY;  Surgeon: Loreta Ave, MD;  Location: [facility] OR;  Service: Orthopedics;  Laterality: Right;  DR MURPHY WANTS 90 MINUTES FOR THIS  CASE. 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 8 g 0   amLODipine (NORVASC) 2.5 MG tablet Take 1 tablet (2.5 mg total) by mouth daily. 90 tablet 3   atorvastatin (LIPITOR) 40 MG tablet TAKE 1 TABLET(40 MG) BY MOUTH DAILY 90 tablet 1   Calcium Carbonate-Vitamin D (CALCIUM PLUS VITAMIN D PO) Take 2.5 tablets by mouth daily.      cyanocobalamin 100 MCG tablet Take 100 mcg by mouth daily.     Ferrous Gluconate-C-Folic Acid (IRON-C PO) Take by mouth. For women 65 mg     fluticasone (FLONASE) 50 MCG/ACT nasal spray Place 2 sprays into both nostrils daily. 16 g 3   metFORMIN (GLUCOPHAGE) 500 MG tablet TAKE 1 TABLET(500 MG) BY MOUTH DAILY WITH BREAKFAST 30  tablet 5   metoprolol succinate (TOPROL-XL) 25 MG 24 hr tablet TAKE 1 TABLET(25 MG) BY MOUTH DAILY 30 tablet 5   Multiple Vitamin (MULTI-DAY PO) Take by mouth. womens multivitiamin gummies 2 a day     omeprazole (PRILOSEC) 20 MG capsule TAKE 1 CAPSULE(20 MG) BY MOUTH DAILY 90 capsule 1   valsartan (DIOVAN) 80 MG tablet TAKE 1 TABLET(80 MG) BY MOUTH AT BEDTIME 90 tablet 0   No current facility-administered medications for this visit.    Allergies-reviewed and updated Allergies  Allergen Reactions   Coumadin [Warfarin Sodium]     Ulcers   Methocarbamol     Hallucinations   Norco [Hydrocodone-Acetaminophen]     Hallucinations    Social History   Social History Narrative   Lives alone. Great  granddaughter stays with her on weekends.    Divorced 52.       Still doing childcare   Retired from [employer]   Objective  Objective:  BP 130/70    Pulse (!) 118    Temp 97.9 F (36.6 C)    Ht 5\' 4"  (1.626 m)    Wt 147 lb 12.8 oz (67 kg)    SpO2 95%    BMI 25.37 kg/m  Gen: NAD, resting comfortably HEENT: Mucous membranes are moist. Oropharynx normal Neck: no thyromegaly CV: RRR no murmurs rubs or gallops Lungs: CTAB no crackles, wheeze, rhonchi Abdomen: soft/nontender/nondistended/normal bowel sounds. No rebound or guarding.  Ext: no edema Skin: warm, dry Neuro: grossly normal, moves all extremities, PERRLA   Assessment and Plan   81 y.o. female presenting for annual physical.  Health Maintenance counseling: 1. Anticipatory guidance: Patient counseled regarding regular dental exams -q6 months, eye exams - yearly,  avoiding smoking and second hand smoke , limiting alcohol to 1 beverage per day- doesn't drink . No illicit drugs    2. Risk factor reduction:  Advised patient of need for regular exercise and diet rich and fruits and vegetables to reduce risk of heart attack and stroke. 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 Diet-feels reasonably healthy- weight stable near upper limit of normal on BMI.  Wt Readings from Last 3 Encounters:  12/17/21 147 lb 12.8 oz (67 kg)  12/01/21 145 lb 2 oz (65.8 kg)  10/07/21 146 lb 9.6 oz (66.5 kg)  3. Immunizations/screenings/ancillary studies DISCUSSED:  -Shingrix vaccination #1- rescheduled these Immunization History  Administered Date(s) Administered   Fluad Quad(high Dose 65+) 10/03/2019, 09/23/2020   Influenza Split 09/08/2011, 10/29/2012   Influenza Whole 08/21/2008, 09/06/2010   Influenza, High Dose Seasonal PF 09/29/2016, 08/23/2018   Influenza,inj,Quad PF,6+ Mos 11/07/2013   PFIZER(Purple Top)SARS-COV-2 Vaccination 02/01/2020, 02/08/2020, 09/08/2020, 06/01/2021    PPD Test 03/24/2017, 03/25/2020   Pneumococcal Conjugate-13 11/07/2013   Pneumococcal Polysaccharide-23 12/05/2005, 09/08/2011   Td 12/05/2001   Tdap 10/29/2012   Zoster, Live 08/21/2008   4. Cervical cancer screening- past age based screening recs. No vaginal bleeding 5. Breast cancer screening-   mammogram - scheduled for this year and had last year 6. Colon cancer screening - negative stool cards 2019- otherwise past age based screening recs. No blood in stool or dark black stool 7. Skin cancer screening- lower risk due to melanin content. advised regular sunscreen use. Denies worrisome, changing, or new skin lesions.  8. Birth control/STD check- not sexually active and postmenopausal 9. Osteoporosis screening at 15- osteopenia 2019 with [facility]- update now -Never smoker  Status of chronic or acute concerns   # Chronic cough S:patient with thought to be viral URI 10/07/21. She states cough persisted even after several weeks but by late December developed wheezing and scheduled visit with Dr. Cherlynn Kaiser on 12/01/21- treated with steroids and augmentin. Felt better on these but then worsened after finishing- was diagnosed with bronchitis.  A/P: cough now for 2 months- had flare in December from what sounds like tobe bronchitis but didn't fully clear with treatment- will get CXR as next step to rule out pneumonia. If no pneumonia possible pulmonary consult.   #Essential hypertension/CKD stage III with elevated microalbumin to creatinine ratio S: Compliant with amlodipine 2.5 mg daily (use to be 5 mg)  - metoprolol 25 mg daily and valsartan 80mg  at bedtime  GFR ranging from high 30s to 50s since 2019 - Patient had elevated microalbumin to creatinine ratio over 500 ratio in 2021 thus need for valsartan-much improved on repeat down to 36 from 514 on microalbumin to creatinine ratio BP Readings from Last 3 Encounters:  12/17/21 130/70  12/01/21 140/80  10/07/21 (!) 155/84  A/P: Hypertension  well-controlled.  CKD stage III hopefully stable.  Update CMP.  For now continue current medication  #Hyperlipidemia #aortic atherosclerosis S: Compliant with atorvastatin 40 mg daily. LDL goal at least under 100. Lab Results  Component Value Date   CHOL 162 11/11/2020   HDL 61 11/11/2020   LDLCALC 89 11/11/2020   LDLDIRECT 100.0 07/23/2018   TRIG 42 11/11/2020   CHOLHDL 2.7 11/11/2020   A/P: Hopefully stable-update lipid panel today-for now continue current medication as long as ldl under 100. Do not want to increase dose due to prediabetes - will check TSH since HR slightly high but has been up since we stopped atenolol some time ago  #GERD/history of GI bleed S: Compliant with omeprazole 20 mg daily. B12 last checked July 2022-reassuring -history of severe GI bleed years ago so keeping her on PPI despite CKD III A/P: With history of significant GI bleed we have opted for long-term omeprazole  # Hyperglycemia/insulin resistance/prediabetes S: Medication: Metformin 500 mg with breakfast started when A1c reached 6.1 and patient also had elevated microalbumin to creatinine ratio Lab Results  Component Value Date   HGBA1C 6.2 06/04/2021   HGBA1C 6.1 (H) 11/11/2020   HGBA1C 5.9 04/16/2020  A/P: Stable on last check-update again today with labs  Recommended follow up: Return in about 6 months (around 06/16/2022) for follow up- or sooner if needed. Future Appointments  Date Time Provider [place]  09/02/2022  8:00 AM [facility] HEALTH COACH [facility]   Lab/Order associations: fasting   ICD-10-CM   1. Preventative health care  Z00.00     2. Essential hypertension  I10 CBC with Differential/Platelet    Comprehensive metabolic panel    Lipid panel    3. Hyperglycemia  R73.9 HgB A1c    4. Gastroesophageal reflux disease without esophagitis  K21.9     5. Stage 3 chronic kidney disease, unspecified whether stage 3a or 3b CKD (HCC)  N18.30     6. Chronic cough  R05.3 DG  Chest 2 View    7. Osteopenia of lumbar spine  M85.88 DG Bone Density    8. Aortic atherosclerosis (HCC) Chronic I70.0      No orders of the defined types were placed in this encounter.  I,Jada Bradford,acting as a scribe for Garret Reddish, MD.,have documented all relevant documentation on the behalf of Garret Reddish, MD,as directed by  Garret Reddish, MD while in the presence of Garret Reddish, MD.   I, Garret Reddish, MD, have reviewed all documentation for this visit. The documentation on 12/17/21 for the exam, diagnosis, procedures, and orders are all accurate and complete.   Return precautions advised.  Garret Reddish, MD

## 2021-12-17 ENCOUNTER — Encounter: Payer: Self-pay | Admitting: Family Medicine

## 2021-12-17 ENCOUNTER — Other Ambulatory Visit: Payer: Self-pay

## 2021-12-17 ENCOUNTER — Ambulatory Visit (INDEPENDENT_AMBULATORY_CARE_PROVIDER_SITE_OTHER): Payer: Medicare Other | Admitting: Family Medicine

## 2021-12-17 VITALS — BP 130/70 | HR 100 | Temp 97.9°F | Ht 64.0 in | Wt 147.8 lb

## 2021-12-17 DIAGNOSIS — R739 Hyperglycemia, unspecified: Secondary | ICD-10-CM | POA: Diagnosis not present

## 2021-12-17 DIAGNOSIS — R053 Chronic cough: Secondary | ICD-10-CM

## 2021-12-17 DIAGNOSIS — M8588 Other specified disorders of bone density and structure, other site: Secondary | ICD-10-CM

## 2021-12-17 DIAGNOSIS — I10 Essential (primary) hypertension: Secondary | ICD-10-CM | POA: Diagnosis not present

## 2021-12-17 DIAGNOSIS — K219 Gastro-esophageal reflux disease without esophagitis: Secondary | ICD-10-CM | POA: Diagnosis not present

## 2021-12-17 DIAGNOSIS — Z0000 Encounter for general adult medical examination without abnormal findings: Secondary | ICD-10-CM

## 2021-12-17 DIAGNOSIS — N183 Chronic kidney disease, stage 3 unspecified: Secondary | ICD-10-CM

## 2021-12-17 DIAGNOSIS — I700 Atherosclerosis of aorta: Secondary | ICD-10-CM

## 2021-12-17 DIAGNOSIS — E785 Hyperlipidemia, unspecified: Secondary | ICD-10-CM

## 2021-12-17 LAB — LIPID PANEL
Cholesterol: 139 mg/dL (ref 0–200)
HDL: 43 mg/dL (ref >39.00)
LDL Cholesterol: 86 mg/dL (ref 0–99)
NonHDL: 95.98
Total CHOL/HDL Ratio: 3
Triglycerides: 52 mg/dL (ref 0.0–149.0)
VLDL: 10.4 mg/dL (ref 0.0–40.0)

## 2021-12-17 LAB — CBC WITH DIFFERENTIAL/PLATELET
Basophils Absolute: 0 10*3/uL (ref 0.0–0.1)
Basophils Relative: 1.1 % (ref 0.0–3.0)
Eosinophils Absolute: 0.1 10*3/uL (ref 0.0–0.7)
Eosinophils Relative: 2.4 % (ref 0.0–5.0)
HCT: 34.6 % — ABNORMAL LOW (ref 36.0–46.0)
Hemoglobin: 11.1 g/dL — ABNORMAL LOW (ref 12.0–15.0)
Lymphocytes Relative: 17.4 % (ref 12.0–46.0)
Lymphs Abs: 0.8 10*3/uL (ref 0.7–4.0)
MCHC: 32 g/dL (ref 30.0–36.0)
MCV: 85.1 fl (ref 78.0–100.0)
Monocytes Absolute: 0.7 10*3/uL (ref 0.1–1.0)
Monocytes Relative: 15 % — ABNORMAL HIGH (ref 3.0–12.0)
Neutro Abs: 2.9 10*3/uL (ref 1.4–7.7)
Neutrophils Relative %: 64.1 % (ref 43.0–77.0)
Platelets: 162 10*3/uL (ref 150.0–400.0)
RBC: 4.07 Mil/uL (ref 3.87–5.11)
RDW: 16.1 % — ABNORMAL HIGH (ref 11.5–15.5)
WBC: 4.5 10*3/uL (ref 4.0–10.5)

## 2021-12-17 LAB — COMPREHENSIVE METABOLIC PANEL
ALT: 49 U/L — ABNORMAL HIGH (ref 0–35)
AST: 44 U/L — ABNORMAL HIGH (ref 0–37)
Albumin: 3.3 g/dL — ABNORMAL LOW (ref 3.5–5.2)
Alkaline Phosphatase: 179 U/L — ABNORMAL HIGH (ref 39–117)
BUN: 17 mg/dL (ref 6–23)
CO2: 28 mEq/L (ref 19–32)
Calcium: 8.8 mg/dL (ref 8.4–10.5)
Chloride: 105 mEq/L (ref 96–112)
Creatinine, Ser: 1.31 mg/dL — ABNORMAL HIGH (ref 0.40–1.20)
GFR: 38.12 mL/min — ABNORMAL LOW (ref >60.00)
Glucose, Bld: 95 mg/dL (ref 70–99)
Potassium: 4.5 mEq/L (ref 3.5–5.1)
Sodium: 142 mEq/L (ref 135–145)
Total Bilirubin: 0.6 mg/dL (ref 0.2–1.2)
Total Protein: 5.9 g/dL — ABNORMAL LOW (ref 6.0–8.3)

## 2021-12-17 LAB — HEMOGLOBIN A1C: Hgb A1c MFr Bld: 6.2 % (ref 4.6–6.5)

## 2021-12-17 LAB — TSH: TSH: 0.35 u[IU]/mL (ref 0.35–5.50)

## 2021-12-17 NOTE — Patient Instructions (Addendum)
Health Maintenance Due  Topic Date Due   Zoster Vaccines- Shingrix (1 of 2) - Please let us know when you receive the shingles shot.  Never done   Please stop by lab before you go If you have mychart- we will send your results within 3 business days of [place] receiving them.  If you do not have mychart- we will call you about results within 5 business days of [place] receiving them.  *please also note that you will see labs on mychart as soon as they post. I will later go in and write notes on them- will say "notes from Dr. Yong Channel"    I love that you're exercising and diet! Please keep up the great work!   Please go to [facility] (updated 01/30/2020) - located [address] across the street from [place] - in the basement - Hours: 8:30-5:00 PM M-F (with lunch from 12:30- 1 PM). You do NOT need an appointment.  - Please ensure you are covid symptom free before going in(No fever, chills, cough, congestion, runny nose, shortness of breath, fatigue, body aches, sore throat, headache, nausea, vomiting, diarrhea, or new loss of taste or smell. No known contacts with covid 19 or someone being tested for covid 19)   Recommended follow up: Return in about 6 months (around 06/16/2022) for follow up- or sooner if needed.

## 2021-12-21 ENCOUNTER — Other Ambulatory Visit: Payer: Self-pay

## 2021-12-21 ENCOUNTER — Telehealth: Payer: Self-pay

## 2021-12-21 DIAGNOSIS — R7989 Other specified abnormal findings of blood chemistry: Secondary | ICD-10-CM

## 2021-12-21 DIAGNOSIS — D649 Anemia, unspecified: Secondary | ICD-10-CM

## 2021-12-21 NOTE — Telephone Encounter (Signed)
Returned pt call, lmtcb. 

## 2021-12-21 NOTE — Telephone Encounter (Signed)
Patient is calling back in regard to lab results.  Can be reached back at [phone].

## 2021-12-22 ENCOUNTER — Other Ambulatory Visit: Payer: Self-pay

## 2021-12-22 ENCOUNTER — Ambulatory Visit (INDEPENDENT_AMBULATORY_CARE_PROVIDER_SITE_OTHER)
Admission: RE | Admit: 2021-12-22 | Discharge: 2021-12-22 | Disposition: A | Discharge status: Home / Self Care | Payer: Medicare Other | Source: Ambulatory Visit | Attending: Family Medicine | Admitting: Family Medicine

## 2021-12-22 DIAGNOSIS — R053 Chronic cough: Secondary | ICD-10-CM

## 2021-12-22 DIAGNOSIS — J90 Pleural effusion, not elsewhere classified: Secondary | ICD-10-CM | POA: Diagnosis not present

## 2021-12-22 DIAGNOSIS — I517 Cardiomegaly: Secondary | ICD-10-CM | POA: Diagnosis not present

## 2021-12-22 NOTE — Telephone Encounter (Signed)
Patient states she is having a chest x ray completed today, 1/18 and Dexa tomorrow 1/19.

## 2021-12-22 NOTE — Telephone Encounter (Signed)
Called and lm on pt vm tcb. 

## 2021-12-22 NOTE — Telephone Encounter (Signed)
Patient requesting call back in regard. 

## 2021-12-22 NOTE — Telephone Encounter (Signed)
Patient has called back in regard for lab results.    I have read patient Dr. Erasmo Leventhal response.  Patient is requesting more information, especially in regard to the kidney diease states III.  I have scheduled patient for Friday 1/20 for labs.  Please have stool card ready up front for pickup.

## 2021-12-23 ENCOUNTER — Other Ambulatory Visit: Payer: Self-pay | Admitting: Family Medicine

## 2021-12-23 DIAGNOSIS — M8589 Other specified disorders of bone density and structure, multiple sites: Secondary | ICD-10-CM | POA: Diagnosis not present

## 2021-12-23 DIAGNOSIS — Z780 Asymptomatic menopausal state: Secondary | ICD-10-CM | POA: Diagnosis not present

## 2021-12-23 LAB — HM DEXA SCAN
HM Dexa Scan: NEGATIVE
HM Dexa Scan: NORMAL

## 2021-12-23 MED ORDER — DOXYCYCLINE HYCLATE 100 MG PO TABS: 100.0000 mg | ORAL_TABLET | Freq: Two times a day (BID) | ORAL | 0 refills | Status: AC

## 2021-12-23 NOTE — Telephone Encounter (Signed)
You can explain these as mild primarily age-related changes to the kidneys.  At her age the main thing we need to avoid her medication a second strain the kidneys but she already avoids some of the main offenders like ibuprofen/Aleve/Motrin-she can take Tylenol

## 2021-12-23 NOTE — Telephone Encounter (Signed)
Called and lm for pt tcb. 

## 2021-12-23 NOTE — Telephone Encounter (Signed)
See below, how do I explain the CKDIII to her?

## 2021-12-24 ENCOUNTER — Other Ambulatory Visit: Payer: Self-pay

## 2021-12-24 ENCOUNTER — Other Ambulatory Visit (INDEPENDENT_AMBULATORY_CARE_PROVIDER_SITE_OTHER): Payer: Medicare Other

## 2021-12-24 ENCOUNTER — Other Ambulatory Visit: Payer: Self-pay | Admitting: Family Medicine

## 2021-12-24 DIAGNOSIS — R053 Chronic cough: Secondary | ICD-10-CM

## 2021-12-24 DIAGNOSIS — J189 Pneumonia, unspecified organism: Secondary | ICD-10-CM

## 2021-12-24 DIAGNOSIS — D649 Anemia, unspecified: Secondary | ICD-10-CM

## 2021-12-24 DIAGNOSIS — R7989 Other specified abnormal findings of blood chemistry: Secondary | ICD-10-CM | POA: Diagnosis not present

## 2021-12-24 LAB — CBC WITH DIFFERENTIAL/PLATELET
Basophils Absolute: 0 10*3/uL (ref 0.0–0.1)
Basophils Relative: 0.7 % (ref 0.0–3.0)
Eosinophils Absolute: 0.1 10*3/uL (ref 0.0–0.7)
Eosinophils Relative: 2.9 % (ref 0.0–5.0)
HCT: 35.9 % — ABNORMAL LOW (ref 36.0–46.0)
Hemoglobin: 11.7 g/dL — ABNORMAL LOW (ref 12.0–15.0)
Lymphocytes Relative: 25.1 % (ref 12.0–46.0)
Lymphs Abs: 1.3 10*3/uL (ref 0.7–4.0)
MCHC: 32.6 g/dL (ref 30.0–36.0)
MCV: 84.4 fl (ref 78.0–100.0)
Monocytes Absolute: 0.4 10*3/uL (ref 0.1–1.0)
Monocytes Relative: 7.3 % (ref 3.0–12.0)
Neutro Abs: 3.2 10*3/uL (ref 1.4–7.7)
Neutrophils Relative %: 64 % (ref 43.0–77.0)
Platelets: 239 10*3/uL (ref 150.0–400.0)
RBC: 4.25 Mil/uL (ref 3.87–5.11)
RDW: 16.2 % — ABNORMAL HIGH (ref 11.5–15.5)
WBC: 5 10*3/uL (ref 4.0–10.5)

## 2021-12-24 LAB — COMPREHENSIVE METABOLIC PANEL
ALT: 79 U/L — ABNORMAL HIGH (ref 0–35)
AST: 55 U/L — ABNORMAL HIGH (ref 0–37)
Albumin: 3.4 g/dL — ABNORMAL LOW (ref 3.5–5.2)
Alkaline Phosphatase: 222 U/L — ABNORMAL HIGH (ref 39–117)
BUN: 15 mg/dL (ref 6–23)
CO2: 28 mEq/L (ref 19–32)
Calcium: 8.8 mg/dL (ref 8.4–10.5)
Chloride: 109 mEq/L (ref 96–112)
Creatinine, Ser: 1.45 mg/dL — ABNORMAL HIGH (ref 0.40–1.20)
GFR: 33.74 mL/min — ABNORMAL LOW (ref >60.00)
Glucose, Bld: 104 mg/dL — ABNORMAL HIGH (ref 70–99)
Potassium: 4.6 mEq/L (ref 3.5–5.1)
Sodium: 143 mEq/L (ref 135–145)
Total Bilirubin: 0.9 mg/dL (ref 0.2–1.2)
Total Protein: 6 g/dL (ref 6.0–8.3)

## 2021-12-24 NOTE — Telephone Encounter (Signed)
Pt came in for labs today, was able to review this phone note with pt at that time.

## 2021-12-28 ENCOUNTER — Other Ambulatory Visit: Payer: Self-pay

## 2021-12-28 ENCOUNTER — Encounter: Payer: Self-pay | Admitting: Family Medicine

## 2021-12-28 ENCOUNTER — Other Ambulatory Visit: Payer: Self-pay | Admitting: Family Medicine

## 2021-12-28 DIAGNOSIS — R7989 Other specified abnormal findings of blood chemistry: Secondary | ICD-10-CM

## 2022-01-05 ENCOUNTER — Other Ambulatory Visit: Payer: Self-pay

## 2022-01-05 ENCOUNTER — Ambulatory Visit
Admission: RE | Admit: 2022-01-05 | Discharge: 2022-01-05 | Disposition: A | Discharge status: Home / Self Care | Payer: Medicare Other | Source: Ambulatory Visit | Attending: Family Medicine | Admitting: Family Medicine

## 2022-01-05 DIAGNOSIS — R7989 Other specified abnormal findings of blood chemistry: Secondary | ICD-10-CM

## 2022-01-05 DIAGNOSIS — K760 Fatty (change of) liver, not elsewhere classified: Secondary | ICD-10-CM | POA: Diagnosis not present

## 2022-01-05 DIAGNOSIS — R945 Abnormal results of liver function studies: Secondary | ICD-10-CM | POA: Diagnosis not present

## 2022-01-07 ENCOUNTER — Other Ambulatory Visit: Payer: Self-pay

## 2022-01-07 ENCOUNTER — Ambulatory Visit (INDEPENDENT_AMBULATORY_CARE_PROVIDER_SITE_OTHER): Payer: Medicare Other | Admitting: Family Medicine

## 2022-01-07 ENCOUNTER — Encounter: Payer: Self-pay | Admitting: Family Medicine

## 2022-01-07 VITALS — BP 118/80 | HR 96 | Temp 98.1°F | Ht 64.0 in | Wt 143.0 lb

## 2022-01-07 DIAGNOSIS — K760 Fatty (change of) liver, not elsewhere classified: Secondary | ICD-10-CM | POA: Diagnosis not present

## 2022-01-07 DIAGNOSIS — E785 Hyperlipidemia, unspecified: Secondary | ICD-10-CM | POA: Diagnosis not present

## 2022-01-07 DIAGNOSIS — I517 Cardiomegaly: Secondary | ICD-10-CM

## 2022-01-07 DIAGNOSIS — R0600 Dyspnea, unspecified: Secondary | ICD-10-CM

## 2022-01-07 DIAGNOSIS — R059 Cough, unspecified: Secondary | ICD-10-CM | POA: Diagnosis not present

## 2022-01-07 DIAGNOSIS — N183 Chronic kidney disease, stage 3 unspecified: Secondary | ICD-10-CM

## 2022-01-07 DIAGNOSIS — I10 Essential (primary) hypertension: Secondary | ICD-10-CM

## 2022-01-07 MED ORDER — DOXYCYCLINE HYCLATE 100 MG PO TABS: 100.0000 mg | ORAL_TABLET | Freq: Two times a day (BID) | ORAL | 0 refills | Status: AC

## 2022-01-07 NOTE — Patient Instructions (Addendum)
Stop by the desk- see if they need a lab appointment for you to come back this afternoon.   We will call you within two weeks about your referral for echocardiogram. If you do not hear within 2 weeks, give [place] a call.    We are going to try doxycycline again since it helped so much before you stopped. Up to 14 days -stop at 10 days if symptoms 100% resolved  Recommended follow up: Return for as needed for new, worsening, persistent symptoms. If new or worsening symptoms seek care over weekend

## 2022-01-07 NOTE — Addendum Note (Signed)
Addended by: Dyann Kief on: 01/07/2022 03:44 PM   Modules accepted: Orders

## 2022-01-07 NOTE — Progress Notes (Signed)
Phone [phone] In person visit   Subjective:   Maria [name] is a 81 y.o. year old very pleasant female patient who presents for/with See problem oriented charting Chief Complaint  Patient presents with   Cough   Chest Congestion    Pt states she did get some better but now she is getting worse again and the cough has come back and is taking her breath. Pt has had to use inhaler.    This visit occurred during the SARS-CoV-2 public health emergency.  Safety protocols were in place, including screening questions prior to the visit, additional usage of staff PPE, and extensive cleaning of exam room while observing appropriate contact time as indicated for disinfecting solutions.   Past Medical History-  Patient Active Problem List   Diagnosis Date Noted   History of gastrointestinal hemorrhage     Priority: High   Fatty liver 01/07/2022    Priority: Medium    Microalbuminuria 12/17/2020    Priority: Medium    Aortic atherosclerosis (HCC) 06/18/2020    Priority: Medium    Hyperglycemia 04/16/2020    Priority: Medium    Chronic kidney disease (CKD), stage III (moderate) (HCC) 10/04/2019    Priority: Medium    GERD (gastroesophageal reflux disease) 05/27/2014    Priority: Medium    Hyperlipidemia LDL goal <100 08/21/2008    Priority: Medium    Essential hypertension 08/21/2008    Priority: Medium    Arthritis of right hip 10/10/2019    Priority: Low   Bursitis of right hip 10/10/2019    Priority: Low   Allergic rhinitis 10/03/2019    Priority: Low   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 8 g 0   amLODipine (NORVASC) 2.5 MG tablet Take 1  tablet (2.5 mg total) by mouth daily. 90 tablet 3   atorvastatin (LIPITOR) 40 MG tablet TAKE 1 TABLET(40 MG) BY MOUTH DAILY 90 tablet 1   Calcium Carbonate-Vitamin D (CALCIUM PLUS VITAMIN D PO) Take 2.5 tablets by mouth daily.      cyanocobalamin 100 MCG tablet Take 100 mcg by mouth daily.     doxycycline (VIBRA-TABS) 100 MG tablet Take 1 tablet (100 mg total) by mouth 2 (two) times daily for 14 days. If 100% improved by day 10 stop. 28 tablet 0   Ferrous Gluconate-C-Folic Acid (IRON-C PO) Take by mouth. For women 65 mg     fluticasone (FLONASE) 50 MCG/ACT nasal spray Place 2 sprays into both nostrils daily. 16 g 3   metFORMIN (GLUCOPHAGE) 500 MG tablet TAKE 1 TABLET(500 MG) BY MOUTH DAILY WITH BREAKFAST 30 tablet 5   metoprolol succinate (TOPROL-XL) 25 MG 24 hr tablet TAKE 1 TABLET(25 MG) BY MOUTH DAILY 30 tablet 5   Multiple Vitamin (MULTI-DAY PO) Take by mouth. womens multivitiamin gummies 2 a day     omeprazole (PRILOSEC) 20 MG capsule TAKE 1 CAPSULE(20 MG) BY MOUTH DAILY 90 capsule 1   valsartan (DIOVAN) 80 MG tablet TAKE 1 TABLET(80 MG) BY MOUTH AT BEDTIME 90 tablet 0   No current facility-administered medications for this visit.     Objective:  BP 118/80    Pulse  96 down from 118   Temp 98.1 F (36.7 C)    Ht 5\' 4"  (1.626 m)    Wt 143 lb (64.9 kg)    SpO2 95%    BMI 24.55 kg/m  Gen: NAD, resting comfortably CV: Heart rate had improved on my exam in the normal range-high 90s-her baseline has been in the 80s to 100s range typically Lungs: CTAB no crackles, wheeze, rhonchi Ext: minimal to trace edema bilaterally  Skin: warm, dry    Assessment and Plan    # Coughing and chest congestion S: Patient on January 13 reported cough for 2 months-previously seen on 10/07/2021 for what was thought to be viral URI.  She ended up seeing Dr. Cherlynn Kaiser on 12/01/2021 and treated with steroids and Augmentin for what was thought to be bacterial bronchitis-initially felt better but then worsened after  finishing  We did a chest x-ray on 12/23/2021 "IMPRESSION: New mild cardiomegaly with trace bilateral pleural effusions and mild peribronchial cuffing which could reflect early edema or atypical/viral infection."  We sent her in a course of doxycycline to cover for atypical infection  Today she reports was about 50-75% better before finishing the course. When she finished within 2 days started to feel poorly again - started coughing up sputum.  Albuterol helps some. At times will feel short of breath such as walking into the office today.   Echo 04/27/20- EF [id number] and some diastolic dysfunction some aortic sclerosis noted.   Feels low energy- stopped iron as making stools darker. Has still been trying to work but she did not go in today  Patient did previously have some swelling in her legs which has been better since reducing amlodipine.  Denies calf pain. A/P: 81 year old female without history of asthma or respiratory disease/never smoker with nearly 3 months of cough that has improved twice with a course of antibiotics-first with Augmentin and then with doxycycline.  Most recently she improved about 50 to 75% on doxycycline 7 days.  Chest x-ray suggestive of possible atypical infection-we opted to treat with another course of doxycycline but using 14 days this time.  She has had some LFT elevations-I wonder if antibiotics could have contributed-creatinine monitor LFTs later today.  Ultrasound primarily showed fatty liver-may hold atorvastatin short-term  Also have mild cardiomegaly on chest x-ray and with dyspnea opted to check BNP as well as repeat echocardiogram done a little under 2 years ago-had some diastolic dysfunction at that time as well as aortic sclerosis.  Chest x-ray showed possible early edema in relation to mild peribronchial cuffing.  With shortness of breath considered cardiology evaluation but with significant improvement in cough and congestion and overall symptoms with  antibiotics thought it was worth a trial of antibiotics first - Also briefly considered chest CT and I am still considering this depending on trajectory -Check CBC to make sure not increasingly anemic and she still needs to complete stool cards that she is aware -I am going to plan on checking on her in about 10 days -Considered updating EKG but heart rate improved by time of recheck  #Essential hypertension/CKD stage III with elevated microalbumin to creatinine ratio S: Compliant with amlodipine 2.5 mg in PM(edema when increased from 10 mg), metoprolol 25 mg daily, valsartan 80mg  at bed time - Prior was on atenolol-chlorthalidone 50-25 mg but had worsening of renal function and issues maintaining potassium.  GFR ranged from high 30s to 50s since 2019-most recently closer to 30 - Patient had elevated microalbumin to  creatinine ratio over 500 ratio November 11, 2020-we started valsartan 80 mg daily BP Readings from Last 3 Encounters:  01/07/22 118/80  12/17/21 130/70  12/01/21 140/80  A/P: Well-controlled blood pressure-continue current medication for now.  Update renal function for CKD stage III-for now continue current medicine that she knows to avoid NSAIDs  -If renal function worsens anymore I may need to stop her metformin  #Hyperlipidemia S: Compliant with none -atorvastatin 40 mg in the past. LDL goal at least under 100. Lab Results  Component Value Date   CHOL 139 12/17/2021   HDL 43.00 12/17/2021   LDLCALC 86 12/17/2021   LDLDIRECT 100.0 07/23/2018   TRIG 52.0 12/17/2021   CHOLHDL 3 12/17/2021   A/P: Reasonable control-with LFT elevation considering holding short-term-does have fatty liver on ultrasound  Recommended follow up: Return for as needed for new, worsening, persistent symptoms.  Regularly scheduled follow-up in July but if not improving we will certainly see her sooner Future Appointments  Date Time Provider [place]  01/18/2022  8:30 AM [facility]  [facility]  06/17/2022 10:40 AM Marin Olp, MD [facility]  06/24/2022  8:00 AM Marin Olp, MD [facility]  09/02/2022  8:00 AM [facility] HEALTH COACH [facility]  12/19/2022  9:40 AM Yong Channel Brayton Mars, MD [facility]    Lab/Order associations:   ICD-10-CM   1. Cough, unspecified type  R05.9     2. Dyspnea, unspecified type  R06.00 ECHOCARDIOGRAM COMPLETE    CBC with Differential/Platelet    Comprehensive metabolic panel    B Nat Peptide    3. Cardiomegaly  I51.7 ECHOCARDIOGRAM COMPLETE    4. Essential hypertension  I10     5. Hyperlipidemia LDL goal <100  E78.5     6. Stage 3 chronic kidney disease, unspecified whether stage 3a or 3b CKD (HCC)  N18.30     7. Fatty liver  K76.0       Meds ordered this encounter  Medications   doxycycline (VIBRA-TABS) 100 MG tablet    Sig: Take 1 tablet (100 mg total) by mouth 2 (two) times daily for 14 days. If 100% improved by day 10 stop.    Dispense:  28 tablet    Refill:  0    I,Jada Bradford,acting as a scribe for Garret Reddish, MD.,have documented all relevant documentation on the behalf of Garret Reddish, MD,as directed by  Garret Reddish, MD while in the presence of Garret Reddish, MD.   I, Garret Reddish, MD, have reviewed all documentation for this visit. The documentation on 01/07/22 for the exam, diagnosis, procedures, and orders are all accurate and complete.   Return precautions advised.  Garret Reddish, MD

## 2022-01-10 ENCOUNTER — Other Ambulatory Visit: Payer: Self-pay | Admitting: Family Medicine

## 2022-01-10 DIAGNOSIS — R7401 Elevation of levels of liver transaminase levels: Secondary | ICD-10-CM

## 2022-01-10 LAB — CBC WITH DIFFERENTIAL/PLATELET
Absolute Monocytes: 899 cells/uL (ref 200–950)
Basophils Absolute: 53 cells/uL (ref 0–200)
Basophils Relative: 0.6 %
Eosinophils Absolute: 62 cells/uL (ref 15–500)
Eosinophils Relative: 0.7 %
HCT: 37.5 % (ref 35.0–45.0)
Hemoglobin: 12.2 g/dL (ref 11.7–15.5)
Lymphs Abs: 2020 cells/uL (ref 850–3900)
MCH: 27.7 pg (ref 27.0–33.0)
MCHC: 32.5 g/dL (ref 32.0–36.0)
MCV: 85.2 fL (ref 80.0–100.0)
MPV: 12.8 fL — ABNORMAL HIGH (ref 7.5–12.5)
Monocytes Relative: 10.1 %
Neutro Abs: 5865 cells/uL (ref 1500–7800)
Neutrophils Relative %: 65.9 %
Platelets: 254 10*3/uL (ref 140–400)
RBC: 4.4 10*6/uL (ref 3.80–5.10)
RDW: 14.4 % (ref 11.0–15.0)
Total Lymphocyte: 22.7 %
WBC: 8.9 10*3/uL (ref 3.8–10.8)

## 2022-01-10 LAB — COMPREHENSIVE METABOLIC PANEL
AG Ratio: 1.6 (calc) (ref 1.0–2.5)
ALT: 109 U/L — ABNORMAL HIGH (ref 6–29)
AST: 158 U/L — ABNORMAL HIGH (ref 10–35)
Albumin: 3.5 g/dL — ABNORMAL LOW (ref 3.6–5.1)
Alkaline phosphatase (APISO): 186 U/L — ABNORMAL HIGH (ref 37–153)
BUN/Creatinine Ratio: 13 (calc) (ref 6–22)
BUN: 19 mg/dL (ref 7–25)
CO2: 21 mmol/L (ref 20–32)
Calcium: 9.1 mg/dL (ref 8.6–10.4)
Chloride: 103 mmol/L (ref 98–110)
Creat: 1.49 mg/dL — ABNORMAL HIGH (ref 0.60–0.95)
Globulin: 2.2 g/dL (calc) (ref 1.9–3.7)
Glucose, Bld: 112 mg/dL — ABNORMAL HIGH (ref 65–99)
Potassium: 4.6 mmol/L (ref 3.5–5.3)
Sodium: 139 mmol/L (ref 135–146)
Total Bilirubin: 0.7 mg/dL (ref 0.2–1.2)
Total Protein: 5.7 g/dL — ABNORMAL LOW (ref 6.1–8.1)

## 2022-01-10 LAB — BRAIN NATRIURETIC PEPTIDE

## 2022-01-15 ENCOUNTER — Other Ambulatory Visit: Payer: Self-pay | Admitting: Family Medicine

## 2022-01-17 ENCOUNTER — Other Ambulatory Visit: Payer: Self-pay

## 2022-01-17 ENCOUNTER — Other Ambulatory Visit (INDEPENDENT_AMBULATORY_CARE_PROVIDER_SITE_OTHER): Payer: Medicare Other

## 2022-01-17 DIAGNOSIS — R7401 Elevation of levels of liver transaminase levels: Secondary | ICD-10-CM | POA: Diagnosis not present

## 2022-01-18 ENCOUNTER — Other Ambulatory Visit (INDEPENDENT_AMBULATORY_CARE_PROVIDER_SITE_OTHER): Payer: Medicare Other

## 2022-01-18 ENCOUNTER — Other Ambulatory Visit: Payer: Medicare Other

## 2022-01-18 ENCOUNTER — Other Ambulatory Visit: Payer: Self-pay

## 2022-01-18 DIAGNOSIS — Z1159 Encounter for screening for other viral diseases: Secondary | ICD-10-CM | POA: Diagnosis not present

## 2022-01-18 LAB — COMPREHENSIVE METABOLIC PANEL
ALT: 45 U/L — ABNORMAL HIGH (ref 0–35)
AST: 27 U/L (ref 0–37)
Albumin: 3.3 g/dL — ABNORMAL LOW (ref 3.5–5.2)
Alkaline Phosphatase: 188 U/L — ABNORMAL HIGH (ref 39–117)
BUN: 18 mg/dL (ref 6–23)
CO2: 26 mEq/L (ref 19–32)
Calcium: 8.7 mg/dL (ref 8.4–10.5)
Chloride: 108 mEq/L (ref 96–112)
Creatinine, Ser: 1.51 mg/dL — ABNORMAL HIGH (ref 0.40–1.20)
GFR: 32.12 mL/min — ABNORMAL LOW (ref >60.00)
Glucose, Bld: 113 mg/dL — ABNORMAL HIGH (ref 70–99)
Potassium: 4 mEq/L (ref 3.5–5.1)
Sodium: 142 mEq/L (ref 135–145)
Total Bilirubin: 0.8 mg/dL (ref 0.2–1.2)
Total Protein: 5.4 g/dL — ABNORMAL LOW (ref 6.0–8.3)

## 2022-01-18 LAB — IBC + FERRITIN
Ferritin: 77.7 ng/mL (ref 10.0–291.0)
Iron: 39 ug/dL — ABNORMAL LOW (ref 42–145)
Saturation Ratios: 16.8 % — ABNORMAL LOW (ref 20.0–50.0)
TIBC: 232.4 ug/dL — ABNORMAL LOW (ref 250.0–450.0)
Transferrin: 166 mg/dL — ABNORMAL LOW (ref 212.0–360.0)

## 2022-01-18 LAB — HEPATITIS A ANTIBODY, TOTAL: Hepatitis A AB,Total: NONREACTIVE

## 2022-01-18 LAB — HEPATITIS B SURFACE ANTIGEN: Hepatitis B Surface Ag: NONREACTIVE

## 2022-01-18 LAB — HEPATITIS B CORE ANTIBODY, TOTAL: Hep B Core Total Ab: NONREACTIVE

## 2022-01-18 LAB — HEPATITIS A ANTIBODY, IGM: Hep A IgM: NONREACTIVE

## 2022-01-18 LAB — HEPATITIS B SURFACE ANTIBODY,QUALITATIVE: Hep B S Ab: NONREACTIVE

## 2022-01-19 LAB — HEPATITIS C ANTIBODY
Hepatitis C Ab: NONREACTIVE
SIGNAL TO CUT-OFF: 0.07 (ref <1.00)

## 2022-01-24 ENCOUNTER — Ambulatory Visit: Payer: Medicare Other | Admitting: Family Medicine

## 2022-01-24 ENCOUNTER — Encounter: Payer: Self-pay | Admitting: Family Medicine

## 2022-01-24 ENCOUNTER — Other Ambulatory Visit: Payer: Self-pay

## 2022-01-24 VITALS — BP 140/80 | HR 100 | Temp 98.0°F | Ht 64.0 in | Wt 147.0 lb

## 2022-01-24 DIAGNOSIS — R600 Localized edema: Secondary | ICD-10-CM | POA: Diagnosis not present

## 2022-01-24 LAB — URINALYSIS, ROUTINE W REFLEX MICROSCOPIC
Bilirubin Urine: NEGATIVE
Ketones, ur: NEGATIVE
Leukocytes,Ua: NEGATIVE
Nitrite: NEGATIVE
Specific Gravity, Urine: 1.015 (ref 1.000–1.030)
Total Protein, Urine: 100 — AB
Urine Glucose: NEGATIVE
Urobilinogen, UA: 0.2 (ref 0.0–1.0)
pH: 6 (ref 5.0–8.0)

## 2022-01-24 LAB — MICROALBUMIN / CREATININE URINE RATIO
Creatinine,U: 115.2 mg/dL
Microalb Creat Ratio: 44.6 mg/g — ABNORMAL HIGH (ref 0.0–30.0)
Microalb, Ur: 51.4 mg/dL — ABNORMAL HIGH (ref 0.0–1.9)

## 2022-01-24 MED ORDER — FUROSEMIDE 20 MG PO TABS: 20.0000 mg | ORAL_TABLET | Freq: Every day | ORAL | 0 refills | Status: DC

## 2022-01-24 NOTE — Progress Notes (Signed)
Subjective:     Patient ID: Maria [name], female    DOB: [birth year], 81 y.o.   MRN: [id number]  Chief Complaint  Patient presents with   Foot Swelling    Bilateral foot swelling started Saturday evening, went down and noticed they were swollen again    HPI B feet swelling-started 2 nights ago, better, but swollen again.  Woke up on Saturday and noticed feet swelling.  Yesterday, church shoes and saw worse.  Elevated legs yesterday and some better but there.  This am worse.  No SOB.  Still coughs and spitting up mucus.  Not using inhaler much. Has been on amlodipine sev months at least. No CP Hands not swollen.  NAS.  Did have ribs from restaurant on Friday.  Off hctz for many months  Health Maintenance Due  Topic Date Due   Zoster Vaccines- Shingrix (1 of 2) Never done    Past Medical History:  Diagnosis Date   Arthritis    Bronchitis    hx of   GERD (gastroesophageal reflux disease)    History of bleeding ulcers    History of gastrointestinal hemorrhage    now off aspirin   Hyperlipidemia    Hypertension    Osteoarthritis of right knee 06/03/2012    Past Surgical History:  Procedure Laterality Date   ABDOMINAL HYSTERECTOMY     nonmalignant reasons   APPENDECTOMY     TOTAL KNEE ARTHROPLASTY  05/30/2012   Procedure: TOTAL KNEE ARTHROPLASTY;  Surgeon: Loreta Ave, MD;  Location: [facility] OR;  Service: Orthopedics;  Laterality: Right;  DR MURPHY WANTS 90 MINUTES FOR THIS CASE. 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   Procedure: LEFT TOTAL KNEE ARTHROPLASTY;  Surgeon: Loreta Ave, MD;  Location: [facility] OR;  Service: Orthopedics;  Laterality: Left;    Outpatient Medications Prior to Visit  Medication Sig Dispense Refill   albuterol (VENTOLIN HFA) 108 (90 Base) MCG/ACT inhaler Inhale 2 puffs into the lungs every 6 (six) hours as needed for wheezing or shortness of breath. 8 g 0   amLODipine (NORVASC) 2.5 MG tablet Take 1 tablet (2.5 mg total) by mouth daily. 90  tablet 3   atorvastatin (LIPITOR) 40 MG tablet TAKE 1 TABLET(40 MG) BY MOUTH DAILY 90 tablet 1   Calcium Carbonate-Vitamin D (CALCIUM PLUS VITAMIN D PO) Take 2.5 tablets by mouth daily.      cyanocobalamin 100 MCG tablet Take 100 mcg by mouth daily.     Ferrous Gluconate-C-Folic Acid (IRON-C PO) Take by mouth. For women 65 mg     fluticasone (FLONASE) 50 MCG/ACT nasal spray Place 2 sprays into both nostrils daily. 16 g 3   metFORMIN (GLUCOPHAGE) 500 MG tablet TAKE 1 TABLET(500 MG) BY MOUTH DAILY WITH BREAKFAST 30 tablet 5   metoprolol succinate (TOPROL-XL) 25 MG 24 hr tablet TAKE 1 TABLET(25 MG) BY MOUTH DAILY 30 tablet 5   Multiple Vitamin (MULTI-DAY PO) Take by mouth. womens multivitiamin gummies 2 a day     omeprazole (PRILOSEC) 20 MG capsule TAKE 1 CAPSULE(20 MG) BY MOUTH DAILY 90 capsule 1   valsartan (DIOVAN) 80 MG tablet TAKE 1 TABLET(80 MG) BY MOUTH AT BEDTIME 90 tablet 0   No facility-administered medications prior to visit.    Allergies  Allergen Reactions   Coumadin [Warfarin Sodium]     Ulcers   Methocarbamol     Hallucinations   Norco [Hydrocodone-Acetaminophen]     Hallucinations   ROS -as in HPI  Objective:     BP 140/80    Pulse 100    Temp 98 F (36.7 C) (Temporal)    Ht 5\' 4"  (1.626 m)    Wt 147 lb (66.7 kg)    SpO2 97%    BMI 25.23 kg/m  Wt Readings from Last 3 Encounters:  01/24/22 147 lb (66.7 kg)  01/07/22 143 lb (64.9 kg)  12/17/21 147 lb 12.8 oz (67 kg)        Gen: WDWN NAD AAF HEENT: NCAT, conjunctiva not injected, sclera nonicteric NECK:  supple, no thyromegaly, no nodes, no carotid bruits CARDIAC: tachyRRR but occ ectopic , S1S2+, no murmur. DP 2+B LUNGS: CTAB. No wheezes ABDOMEN:  protruburant BS+, soft, NTND, No HSM, no masses EXT:  1+ edema 1/4 way up.  MSK: no gross abnormalities.  NEURO: A&O x3.  CN II-XII intact.  PSYCH: normal mood. Good eye contact  Reviewed labs and notes for 82minutes  Ekf: rate 107. Occ ectopic.  Similar  to prev.     Assessment & Plan:   Problem List Items Addressed This Visit   None Visit Diagnoses     Localized edema    -  Primary   Relevant Orders   EKG 12-Lead     1  edema-?amlodipine(but has been on awhile), diastolic dysfunction, venous insuff,CHF, proteinuria, other.  Will check urine microalb/creat, protein.  Labs have been ok x protein/albumin a little low. Echo in process of ordering.  Will do Lasix x 2 days and call w/progress(has CKD 3 so not want to overdo it.   No orders of the defined types were placed in this encounter.   Wellington Hampshire, MD

## 2022-01-24 NOTE — Patient Instructions (Signed)
It was very nice to see you today!  Adding lasix for 2 days-call on Thursday w/results of swelling.     PLEASE NOTE:  If you had any lab tests please let us know if you have not heard back within a few days. You may see your results on MyChart before we have a chance to review them but we will give you a call once they are reviewed by [place]. If we ordered any referrals today, please let us know if you have not heard from their office within the next week.   Please try these tips to maintain a healthy lifestyle:  Eat most of your calories during the day when you are active. Eliminate processed foods including packaged sweets (pies, cakes, cookies), reduce intake of potatoes, white bread, white pasta, and white rice. Look for whole grain options, oat flour or almond flour.  Each meal should contain half fruits/vegetables, one quarter protein, and one quarter carbs (no bigger than a computer mouse).  Cut down on sweet beverages. This includes juice, soda, and sweet tea. Also watch fruit intake, though this is a healthier sweet option, it still contains natural sugar! Limit to 3 servings daily.  Drink at least 1 glass of water with each meal and aim for at least 8 glasses per day  Exercise at least 150 minutes every week.

## 2022-01-27 ENCOUNTER — Telehealth: Payer: Self-pay | Admitting: Family Medicine

## 2022-01-27 NOTE — Telephone Encounter (Signed)
Agree with taking less Lasix-please update me on status of echocardiogram as well

## 2022-01-27 NOTE — Telephone Encounter (Signed)
Pt states swelling has gone down in her feet. Some of the swelling has gone down in her stomach but not completely. She stated she did not take the last pill of the medication Dr Ruthine Dose prescribed during her last visit. She also wanted to let Durene Cal know she is getting her Chest xray on 02/09/22

## 2022-01-27 NOTE — Telephone Encounter (Signed)
Returned call to patient. Patient notified and verbalized understanding.

## 2022-02-04 ENCOUNTER — Ambulatory Visit: Payer: Medicare Other | Admitting: Family Medicine

## 2022-02-07 ENCOUNTER — Telehealth: Payer: Self-pay

## 2022-02-07 NOTE — Telephone Encounter (Signed)
Pt called and stated that she woke up this morning and both feet were swollen and she noticed that her left breast was swollen and hanging "lower than the other". Pt states she is not in any pain but wanted to let you know. She has been propping her feet up at home to try and help the swelling. She plans to go get the chest xray today or tomorrow. Do you want pt to be worked in tomorrow to be evaluated? ?

## 2022-02-08 ENCOUNTER — Ambulatory Visit (INDEPENDENT_AMBULATORY_CARE_PROVIDER_SITE_OTHER): Payer: Medicare Other | Admitting: Family Medicine

## 2022-02-08 ENCOUNTER — Ambulatory Visit (INDEPENDENT_AMBULATORY_CARE_PROVIDER_SITE_OTHER)
Admission: RE | Admit: 2022-02-08 | Discharge: 2022-02-08 | Disposition: A | Discharge status: Home / Self Care | Payer: Medicare Other | Source: Ambulatory Visit | Attending: Family Medicine | Admitting: Family Medicine

## 2022-02-08 ENCOUNTER — Encounter: Payer: Self-pay | Admitting: Family Medicine

## 2022-02-08 ENCOUNTER — Other Ambulatory Visit: Payer: Self-pay

## 2022-02-08 ENCOUNTER — Other Ambulatory Visit: Payer: Self-pay | Admitting: Family Medicine

## 2022-02-08 VITALS — BP 134/78 | HR 100 | Temp 98.2°F | Ht 64.0 in | Wt 149.8 lb

## 2022-02-08 DIAGNOSIS — R059 Cough, unspecified: Secondary | ICD-10-CM | POA: Diagnosis not present

## 2022-02-08 DIAGNOSIS — J189 Pneumonia, unspecified organism: Secondary | ICD-10-CM

## 2022-02-08 DIAGNOSIS — I10 Essential (primary) hypertension: Secondary | ICD-10-CM | POA: Diagnosis not present

## 2022-02-08 DIAGNOSIS — E785 Hyperlipidemia, unspecified: Secondary | ICD-10-CM

## 2022-02-08 DIAGNOSIS — N183 Chronic kidney disease, stage 3 unspecified: Secondary | ICD-10-CM | POA: Diagnosis not present

## 2022-02-08 DIAGNOSIS — R809 Proteinuria, unspecified: Secondary | ICD-10-CM | POA: Diagnosis not present

## 2022-02-08 DIAGNOSIS — R600 Localized edema: Secondary | ICD-10-CM

## 2022-02-08 DIAGNOSIS — R053 Chronic cough: Secondary | ICD-10-CM | POA: Diagnosis not present

## 2022-02-08 DIAGNOSIS — J90 Pleural effusion, not elsewhere classified: Secondary | ICD-10-CM | POA: Diagnosis not present

## 2022-02-08 DIAGNOSIS — J9811 Atelectasis: Secondary | ICD-10-CM | POA: Diagnosis not present

## 2022-02-08 DIAGNOSIS — I517 Cardiomegaly: Secondary | ICD-10-CM | POA: Diagnosis not present

## 2022-02-08 LAB — POC URINALSYSI DIPSTICK (AUTOMATED)
Bilirubin, UA: NEGATIVE
Blood, UA: NEGATIVE
Glucose, UA: NEGATIVE
Ketones, UA: NEGATIVE
Leukocytes, UA: NEGATIVE
Nitrite, UA: NEGATIVE
Protein, UA: POSITIVE — AB
Spec Grav, UA: 1.015 (ref 1.010–1.025)
Urobilinogen, UA: 0.2 E.U./dL
pH, UA: 5.5 (ref 5.0–8.0)

## 2022-02-08 MED ORDER — FUROSEMIDE 20 MG PO TABS
20.0000 mg | ORAL_TABLET | Freq: Every day | ORAL | 0 refills | Status: DC | PRN
Start: 2022-02-08 — End: 2022-02-14

## 2022-02-08 NOTE — Patient Instructions (Addendum)
Team we need updated echocardiogram - she has not heard about this previously ordered ?-also extremely important to get BNP- see last result note- test was not performed for unclear reason- lets work with lab to make sure this gets done ? ?I am very likely to refer to cardiology with ongoing high heart rate and edema issues but would like to have more info with labs and x-ray. I am worried about heart failure possible but I also wonder if this could be coming from your kidneys/spilling protein so I am checking on both today.  ? ?Try to get chest x-ray as soon as you can ? ?Take lasix daily for the next week and then lets see each other back ? ?Recommended follow up: schedule 4 pm Wednesday the 15th ?

## 2022-02-08 NOTE — Progress Notes (Signed)
Phone [phone] In person visit   Subjective:   Maria [name] is a 81 y.o. year old very pleasant female patient who presents for/with See problem oriented charting Chief Complaint  Patient presents with   bilateral ankle edema    Pt states she feels like her edema has gone down just a tad.   left breast swelling    Pt states her left breast felt bigger than the right like it has fluid it denies pain.    This visit occurred during the SARS-CoV-2 public health emergency.  Safety protocols were in place, including screening questions prior to the visit, additional usage of staff PPE, and extensive cleaning of exam room while observing appropriate contact time as indicated for disinfecting solutions.   Past Medical History-  Patient Active Problem List   Diagnosis Date Noted   History of gastrointestinal hemorrhage     Priority: High   Fatty liver 01/07/2022    Priority: Medium    Microalbuminuria 12/17/2020    Priority: Medium    Aortic atherosclerosis (HCC) 06/18/2020    Priority: Medium    Hyperglycemia 04/16/2020    Priority: Medium    Chronic kidney disease (CKD), stage III (moderate) (HCC) 10/04/2019    Priority: Medium    GERD (gastroesophageal reflux disease) 05/27/2014    Priority: Medium    Hyperlipidemia LDL goal <100 08/21/2008    Priority: Medium    Essential hypertension 08/21/2008    Priority: Medium    Arthritis of right hip 10/10/2019    Priority: Low   Bursitis of right hip 10/10/2019    Priority: Low   Allergic rhinitis 10/03/2019    Priority: Low   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 8 g 0   amLODipine (NORVASC)  2.5 MG tablet Take 1 tablet (2.5 mg total) by mouth daily. 90 tablet 3   atorvastatin (LIPITOR) 40 MG tablet TAKE 1 TABLET(40 MG) BY MOUTH DAILY 90 tablet 1   Calcium Carbonate-Vitamin D (CALCIUM PLUS VITAMIN D PO) Take 2.5 tablets by mouth daily.      cyanocobalamin 100 MCG tablet Take 100 mcg by mouth daily.     Ferrous Gluconate-C-Folic Acid (IRON-C PO) Take by mouth. For women 65 mg     fluticasone (FLONASE) 50 MCG/ACT nasal spray Place 2 sprays into both nostrils daily. 16 g 3   metFORMIN (GLUCOPHAGE) 500 MG tablet TAKE 1 TABLET(500 MG) BY MOUTH DAILY WITH BREAKFAST 30 tablet 5   metoprolol succinate (TOPROL-XL) 25 MG 24 hr tablet TAKE 1 TABLET(25 MG) BY MOUTH DAILY 30 tablet 5   Multiple Vitamin (MULTI-DAY PO) Take by mouth. womens multivitiamin gummies 2 a day     omeprazole (PRILOSEC) 20 MG capsule TAKE 1 CAPSULE(20 MG) BY MOUTH DAILY 90 capsule 1   valsartan (DIOVAN) 80 MG tablet TAKE 1 TABLET(80 MG) BY MOUTH AT BEDTIME 90 tablet 0   furosemide (LASIX) 20 MG tablet Take 1 tablet (20 mg total) by mouth daily as needed for fluid or edema (as per Dr. Durene Cal). 30 tablet 0   No current facility-administered medications for this visit.     Objective:  BP 134/78   Pulse  100   Temp 98.2 F (36.8 C)   Ht 5\' 4"  (1.626 m)   Wt 149 lb 12.8 oz (67.9 kg)   SpO2 94%   BMI 25.71 kg/m  Gen: NAD, resting comfortably CV: Slightly tachycardic no murmurs rubs or gallops Lungs: CTAB no crackles, wheeze, rhonchi Left breast edematous-slightly red appears to be from swelling and slightly warm-does not have classic cellulitis appearance-do not strongly suspect mastitis Abdomen: Some slight pitting over abdomen Ext: 1-2+  edema Skin: warm, dry     Assessment and Plan   #Bilateral ankle edema #Left breast swelling S: Patient called in reporting bilateral ankle swelling yesterday.  She has been trying to elevate the legs and has noted this is started to go back down some  Patient also felt  like she had some left breast swelling starting Thursday of last week (felt itchy at first in shower-appears to be larger than the right-felt like there may be fluid in it but denied pain.   Of note patient had been seen about a month ago for cough that improved twice with course of antibiotic-first with Augmentin and later doxycycline and at that time we treated her with a 14-day course of doxycycline. She reports cough was 95% better then started to come back slightly when she noted swelling. Not using albuterol as much. She did have some LFT elevations on antibiotics and we wondered if antibiotics could have contributed.  We did hepatitis panel which was negative as well as ultrasound which currently showed fatty liver.  Follow-up LFTs on 01/17/2022 were approaching normal levels suggesting antibiotics may have been at play  Had also seen Dr. Ruthine Dose and there was concern for possible fluid overload (thought less likely due to long term los dose amlodipine- some consideration that could be related to prior diastolic dysfunction as well as venous insufficiency, proteinuria- urine micro/cr ratio was lower than since starting valsartan though)-  tried lasix had there was some improvement in cough, shortness of breath, swelling- then symptoms worsened -abdomen feels swollen as well   She has had some shortness of breath and we did a chest x-ray as well as BNP-chest x-ray showed mild cardiomegaly and echocardiogram ordered- still pending. She has not done follow up CXR yet. Marland Kitchen  BNP was ordered but was not performed-instructed team to order follow-up testing and per notes this was set up but no evidence of BNP being obtained was noted though other labs were checked.  We had considered chest CT as well -Patient had some prior anemia but this had resolved on last check  A/P: 81 year old female with rather significant edema -She does have proteinuria but microalbumin to creatinine ratio actually has appeared  improved since starting valsartan-considering nephrology referral but I would like to evaluate from cardiac perspective first (will do add on evaluation for urine sediment with protein once again noted on point-of-care urine today) -She has been tachycardic recently but seems to be more PVCs and sinus tachycardia-considering cardiology evaluation -Unfortunate prior BNP did not process-order again and also recheck chest x-ray and check back in with referral coordinator on why echocardiogram had not been scheduled (she did have an echocardiogram about 2 years ago but with worsening edema prudent to recheck) -I am concerned fluid overload could be cardiac related/CHF and most strongly leaning toward cardiology consult - We will start her on Lasix daily and see back in a week and recheck renal function and fluid status-if she has worsening symptoms should let us know.  Asymmetrical  left breast swelling noted-do not suspect cellulitis at this time but if worsens despite diuresis consider further work-up    #Essential hypertension/CKD stage III with elevated microalbumin to creatinine ratio S: Compliant with amlodipine 5 mg in PM(edema when increased from 10 mg), metoprolol 25 mg XR, valsartan 80mg  - Prior was on atenolol-chlorthalidone 50-25 mg but had worsening of renal function and issues maintaining potassium.  GFR ranging from high 30s to 50s since 2019 - Patient had elevated microalbumin to creatinine ratio over 500 ratio November 11, 2020-we started valsartan 80 mg daily and level improved to 36 and most recently 44 BP Readings from Last 3 Encounters:  02/08/22 134/78  01/24/22 140/80  01/07/22 118/80    A/P: Hypertension is well controlled.  I am slightly worried about CKD with proteinuria despite improvements as above-for now continue current medication    Recommended follow up: 1 week follow-up or certainly sooner if symptoms worsen or fail to improve Future Appointments  Date Time Provider  Department Center  02/16/2022  4:00 PM Shelva Majestic, MD [facility]  06/17/2022 10:40 AM Shelva Majestic, MD [facility]  06/24/2022  8:00 AM Shelva Majestic, MD [facility]  09/02/2022  8:00 AM [facility] HEALTH COACH [facility]  12/19/2022  9:40 AM Durene Cal Aldine Contes, MD [facility]    Lab/Order associations:   ICD-10-CM   1. Hyperlipidemia LDL goal <100  E78.5 CBC with Differential/Platelet    Comprehensive metabolic panel    TSH    POCT Urinalysis Dipstick (Automated)    2. Bilateral leg edema  R60.0 CBC with Differential/Platelet    Comprehensive metabolic panel    TSH    POCT Urinalysis Dipstick (Automated)    B Nat Peptide    3. Proteinuria, unspecified type  R80.9 Urine Microscopic    4. Essential hypertension  I10     5. Stage 3 chronic kidney disease, unspecified whether stage 3a or 3b CKD (HCC)  N18.30       Meds ordered this encounter  Medications   furosemide (LASIX) 20 MG tablet    Sig: Take 1 tablet (20 mg total) by mouth daily as needed for fluid or edema (as per Dr. Durene Cal).    Dispense:  30 tablet    Refill:  0    Return precautions advised.  Tana Conch, MD

## 2022-02-08 NOTE — Telephone Encounter (Signed)
Patient has been scheduled

## 2022-02-09 ENCOUNTER — Other Ambulatory Visit: Payer: Self-pay

## 2022-02-09 ENCOUNTER — Telehealth: Payer: Self-pay

## 2022-02-09 DIAGNOSIS — R600 Localized edema: Secondary | ICD-10-CM

## 2022-02-09 LAB — CBC WITH DIFFERENTIAL/PLATELET
Basophils Absolute: 0.1 10*3/uL (ref 0.0–0.1)
Basophils Relative: 1.1 % (ref 0.0–3.0)
Eosinophils Absolute: 0.1 10*3/uL (ref 0.0–0.7)
Eosinophils Relative: 1.8 % (ref 0.0–5.0)
HCT: 35.3 % — ABNORMAL LOW (ref 36.0–46.0)
Hemoglobin: 11.4 g/dL — ABNORMAL LOW (ref 12.0–15.0)
Lymphocytes Relative: 22.8 % (ref 12.0–46.0)
Lymphs Abs: 1.4 10*3/uL (ref 0.7–4.0)
MCHC: 32.4 g/dL (ref 30.0–36.0)
MCV: 86 fl (ref 78.0–100.0)
Monocytes Absolute: 0.5 10*3/uL (ref 0.1–1.0)
Monocytes Relative: 7.5 % (ref 3.0–12.0)
Neutro Abs: 4 10*3/uL (ref 1.4–7.7)
Neutrophils Relative %: 66.8 % (ref 43.0–77.0)
Platelets: 173 10*3/uL (ref 150.0–400.0)
RBC: 4.11 Mil/uL (ref 3.87–5.11)
RDW: 16.8 % — ABNORMAL HIGH (ref 11.5–15.5)
WBC: 6 10*3/uL (ref 4.0–10.5)

## 2022-02-09 LAB — COMPREHENSIVE METABOLIC PANEL
ALT: 46 U/L — ABNORMAL HIGH (ref 0–35)
AST: 34 U/L (ref 0–37)
Albumin: 3.3 g/dL — ABNORMAL LOW (ref 3.5–5.2)
Alkaline Phosphatase: 150 U/L — ABNORMAL HIGH (ref 39–117)
BUN: 22 mg/dL (ref 6–23)
CO2: 24 mEq/L (ref 19–32)
Calcium: 8.5 mg/dL (ref 8.4–10.5)
Chloride: 108 mEq/L (ref 96–112)
Creatinine, Ser: 1.48 mg/dL — ABNORMAL HIGH (ref 0.40–1.20)
GFR: 32.89 mL/min — ABNORMAL LOW (ref >60.00)
Glucose, Bld: 88 mg/dL (ref 70–99)
Potassium: 4.4 mEq/L (ref 3.5–5.1)
Sodium: 142 mEq/L (ref 135–145)
Total Bilirubin: 0.8 mg/dL (ref 0.2–1.2)
Total Protein: 5.5 g/dL — ABNORMAL LOW (ref 6.0–8.3)

## 2022-02-09 LAB — URINALYSIS, MICROSCOPIC ONLY: RBC / HPF: NONE SEEN (ref 0–?)

## 2022-02-09 LAB — TSH: TSH: 0.91 u[IU]/mL (ref 0.35–5.50)

## 2022-02-09 NOTE — Telephone Encounter (Signed)
LMOVM to return call for to schedule lab appt for BNP - test not processed by lab.  ? ?Labs ordered, patient just needs a lab appt ?

## 2022-02-09 NOTE — Telephone Encounter (Signed)
Patient will be here for labs tomorrow 3/9 at 915am -  ?

## 2022-02-10 ENCOUNTER — Other Ambulatory Visit (INDEPENDENT_AMBULATORY_CARE_PROVIDER_SITE_OTHER): Payer: Medicare Other

## 2022-02-10 DIAGNOSIS — R600 Localized edema: Secondary | ICD-10-CM | POA: Diagnosis not present

## 2022-02-10 LAB — BRAIN NATRIURETIC PEPTIDE: Pro B Natriuretic peptide (BNP): 1994 pg/mL — ABNORMAL HIGH (ref 0.0–100.0)

## 2022-02-11 ENCOUNTER — Other Ambulatory Visit: Payer: Self-pay

## 2022-02-11 DIAGNOSIS — R7989 Other specified abnormal findings of blood chemistry: Secondary | ICD-10-CM

## 2022-02-11 DIAGNOSIS — I509 Heart failure, unspecified: Secondary | ICD-10-CM

## 2022-02-13 NOTE — Progress Notes (Signed)
Cardiology Office Note:    Date:  02/14/2022   ID:  Maria [name], DOB 1941/02/18, MRN [id number]  PCP:  Shelva Majestic, MD   [facility] Providers Cardiologist:  Christell Constant, MD     Referring MD: Shelva Majestic, MD   CC: Heart failure Consulted for the evaluation of heart failure at the behest of Shelva Majestic, MD  History of Present Illness:    Maria [name] is a 81 y.o. female with a hx of HTN, aortic atherosclerosis and HLD, CKD stage IIIa.  Patient notes that she is feeling better- was noting feeling well before seeing Dr. Durene Cal.  Starting around Christmas, She has new SOB.  Accompanied with wheezing and DOE and non productive cough.  She was started on lasix and antibiotics and prednisone for bronchitis.  Has improved with all of this.  Now sleeps with legs propped and she has improved.  Has echo this Friday.    Symptoms have largely improved.  Has had some rare chest tightness that she attributed to her cough.  This has resolved with diuretics.  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    Notes that she has rare persistent shortness of breath.  Notes PND and orthopnea laying flat.  No syncope or near syncope . Notes no palpitations or funny heart beats.     Patient reports prior cardiac testing including normal echo in 2021.   Past Medical History:  Diagnosis Date   Arthritis    Bronchitis    hx of   GERD (gastroesophageal reflux disease)    History of bleeding ulcers    History of gastrointestinal hemorrhage    now off aspirin   Hyperlipidemia    Hypertension    Osteoarthritis of right knee 06/03/2012    Past Surgical History:  Procedure Laterality Date   ABDOMINAL HYSTERECTOMY     nonmalignant reasons   APPENDECTOMY     TOTAL KNEE ARTHROPLASTY  05/30/2012   Procedure: TOTAL KNEE ARTHROPLASTY;  Surgeon: Loreta Ave, MD;  Location: [facility] OR;  Service: Orthopedics;  Laterality: Right;   DR MURPHY WANTS 90 MINUTES FOR THIS CASE. 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   amLODipine (NORVASC) 2.5 MG tablet Take 1 tablet (2.5 mg total) by mouth daily.   atorvastatin (LIPITOR) 40 MG tablet TAKE 1 TABLET(40 MG) BY MOUTH DAILY   Calcium Carbonate-Vitamin D (CALCIUM PLUS VITAMIN D PO) Take 2.5 tablets by mouth daily.    cyanocobalamin 100 MCG tablet Take 100 mcg by mouth daily.   Ferrous Gluconate-C-Folic Acid (IRON-C PO) Take by mouth. For women 65 mg   furosemide (LASIX) 40 MG tablet Take 1 tablet (40 mg total) by mouth daily.   metFORMIN (GLUCOPHAGE) 500 MG tablet TAKE 1 TABLET(500 MG) BY MOUTH DAILY WITH BREAKFAST   metoprolol succinate (TOPROL-XL) 25 MG 24 hr tablet TAKE 1 TABLET(25 MG) BY MOUTH DAILY   Multiple Vitamin (MULTI-DAY PO) Take by mouth. womens multivitiamin gummies 2 a day   omeprazole (PRILOSEC) 20 MG capsule TAKE 1 CAPSULE(20 MG) BY MOUTH DAILY   valsartan (DIOVAN)  80 MG tablet TAKE 1 TABLET(80 MG) BY MOUTH AT BEDTIME   [DISCONTINUED] furosemide (LASIX) 20 MG tablet Take 1 tablet (20 mg total) by mouth daily as needed for fluid or edema (as per Dr. Durene Cal).     Allergies:   Coumadin [warfarin sodium], Methocarbamol, and Norco [hydrocodone-acetaminophen]   Social History   Socioeconomic History   Marital status: Divorced    Spouse name: Not on file   Number of children: Not on file   Years of education: Not on file   Highest education level: Not on file  Occupational History   Occupation: day care   Tobacco Use   Smoking status: Never   Smokeless tobacco: Never  Substance and Sexual Activity   Alcohol  use: No   Drug use: No   Sexual activity: Not on file  Other Topics Concern   Not on file  Social History Narrative   Lives alone. Great granddaughter stays with her on weekends.    Divorced 36.       Still doing childcare   Retired from [employer]   Social Determinants of Corporate investment banker Strain: Low Risk    Difficulty of Paying Living Expenses: Not hard at all  Food Insecurity: No Food Insecurity   Worried About Programme researcher, broadcasting/film/video in the Last Year: Never true   Barista in the Last Year: Never true  Transportation Needs: No Transportation Needs   Lack of Transportation (Medical): No   Lack of Transportation (Non-Medical): No  Physical Activity: Inactive   Days of Exercise per Week: 0 days   Minutes of Exercise per Session: 0 min  Stress: No Stress Concern Present   Feeling of Stress : Not at all  Social Connections: Moderately Isolated   Frequency of Communication with Friends and Family: More than three times a week   Frequency of Social Gatherings with Friends and Family: More than three times a week   Attends Religious Services: More than 4 times per year   Active Member of [employer] or Organizations: No   Attends Banker Meetings: Never   Marital Status: Divorced    Social: Very Religious  Family History: The patient's family history includes Congestive Heart Failure in her mother; Other in her father.  ROS:   Please see the history of present illness.     All other systems reviewed and are negative.  EKGs/Labs/Other Studies Reviewed:    The following studies were reviewed today:  EKG:  EKG is  ordered today.  The ekg ordered today demonstrates  02/14/22: Sinus tachycardia with frequent PVCs Recent Labs: 01/07/2022: Brain Natriuretic Peptide CANCELED 02/08/2022: ALT 46; BUN 22; Creatinine, Ser 1.48; Hemoglobin 11.4; Platelets 173.0; Potassium 4.4; Sodium 142; TSH 0.91 02/10/2022: Pro B Natriuretic peptide (BNP) 1,994.0  Recent  Lipid Panel    Component Value Date/Time   CHOL 139 12/17/2021 1033   TRIG 52.0 12/17/2021 1033   HDL 43.00 12/17/2021 1033   CHOLHDL 3 12/17/2021 1033   VLDL 10.4 12/17/2021 1033   LDLCALC 86 12/17/2021 1033   LDLCALC 89 11/11/2020 0939   LDLDIRECT 100.0 07/23/2018 1108        Physical Exam:    VS:  BP 132/68   Pulse (!) 114   Ht 5\' 1"  (1.549 m)   Wt 153 lb (69.4 kg)   SpO2 100%   BMI 28.91 kg/m     Wt Readings from Last 3 Encounters:  02/14/22 153 lb (69.4 kg)  02/08/22 149 lb 12.8 oz (67.9 kg)  01/24/22 147 lb (66.7 kg)    Gen: No distress   Neck: 6 cm JVD at 45 degrees Cardiac: No Rubs or Gallops, IRIR tachycardia with soft holosystolic murmur Respiratory: Clear to auscultation bilaterally, normal effort, normal  respiratory rate GI: Soft, nontender, non-distended  MS: +1 edema;  moves all extremities Integument: Skin feels warm Neuro:  At time of evaluation, alert and oriented to person/place/time/situation  Psych: Normal affect, patient feels better   ASSESSMENT:    1. PVC (premature ventricular contraction)   2. Aortic atherosclerosis (HCC)   3. Essential hypertension   4. Stage 3 chronic kidney disease, unspecified whether stage 3a or 3b CKD (HCC)    PLAN:    HF sequelae and Frequent PVCs HTN Aortic Athosclerosis and HLD CKD Stage IIIa - will repeat echocardiogram (scheduled Friday and will get results cc'ed to me) and get 14 day non live zio patch for PVC burden - increase to lasix 40 mg and get BMP and BNP in two weeks - continue valsartan 80 mg - continue succinate 25 mg PO daily may increase based on frequent PVCs - continue atorvastatin 40 mg  - low threshold to stop norvasc and increase ARB - based on LVEF and PVCs, we have discussed either NM Stress testing or coronary CT   Three months with me       Medication Adjustments/Labs and Tests Ordered: Current medicines are reviewed at length with the patient today.  Concerns regarding  medicines are outlined above.  Orders Placed This Encounter  Procedures   Basic metabolic panel   LONG TERM MONITOR (3-14 DAYS)   EKG 12-Lead   Meds ordered this encounter  Medications   furosemide (LASIX) 40 MG tablet    Sig: Take 1 tablet (40 mg total) by mouth daily.    Dispense:  90 tablet    Refill:  3    Patient Instructions  Medication Instructions:  Your physician has recommended you make the following change in your medication: INCREASE: furosemide (Lasix) to 40 mg by mouth once a day  *If you need a refill on your cardiac medications before your next appointment, please call your pharmacy*   Lab Work: IN 10-14 DAYS: BMP If you have labs (blood work) drawn today and your tests are completely normal, you will receive your results only by: MyChart Message (if you have MyChart) OR A paper copy in the mail If you have any lab test that is abnormal or we need to change your treatment, we will call you to review the results.   Testing/Procedures: Your physician has requested that you wear a 14 day heart monitor.   Follow-Up: At [facility], you and your health needs are our priority.  As part of our continuing mission to provide you with exceptional heart care, we have created designated Provider Care Teams.  These Care Teams include your primary Cardiologist (physician) and Advanced Practice Providers (APPs -  Physician Assistants and Nurse Practitioners) who all work together to provide you with the care you need, when you need it.  We recommend signing up for the patient portal called "MyChart".  Sign up information is provided on this After Visit Summary.  MyChart is used to connect with patients for Virtual Visits (Telemedicine).  Patients are able to view lab/test results, encounter notes, upcoming appointments, etc.  Non-urgent messages can be sent to your provider as well.   To learn more about what you can do with  MyChart, go to [email or web address].    Your  next appointment:   3 month(s)  The format for your next appointment:   In Person  Provider:   Christell Constant, MD     Other Instructions Christena Deem- Long Term Monitor Instructions  Your physician has requested you wear a ZIO patch monitor for 14 days.  This is a single patch monitor. [employer] supplies one patch monitor per enrollment. Additional stickers are not available. Please do not apply patch if you will be having a Nuclear Stress Test,  Echocardiogram, Cardiac CT, MRI, or Chest Xray during the period you would be wearing the  monitor. The patch cannot be worn during these tests. You cannot remove and re-apply the  ZIO XT patch monitor.  Your ZIO patch monitor will be mailed 3 day USPS to your address on file. It may take 3-5 days  to receive your monitor after you have been enrolled.  Once you have received your monitor, please review the enclosed instructions. Your monitor  has already been registered assigning a specific monitor serial # to you.  Billing and Patient Assistance Program Information  We have supplied [employer] with any of your insurance information on file for billing purposes. [employer] offers a sliding scale Patient Assistance Program for patients that do not have  insurance, or whose insurance does not completely cover the cost of the ZIO monitor.  You must apply for the Patient Assistance Program to qualify for this discounted rate.  To apply, please call [employer] at [phone], select option 4, select option 2, ask to apply for  Patient Assistance Program. Meredeth Ide will ask your household income, and how many people  are in your household. They will quote your out-of-pocket cost based on that information.  [employer] will also be able to set up a 5-month, interest-free payment plan if needed.  Applying the monitor   Shave hair from upper left chest.  Hold abrader disc by orange tab. Rub abrader in 40 strokes over the upper left chest as  indicated in  your monitor instructions.  Clean area with 4 enclosed alcohol pads. Let dry.  Apply patch as indicated in monitor instructions. Patch will be placed under collarbone on left  side of chest with arrow pointing upward.  Rub patch adhesive wings for 2 minutes. Remove white label marked "1". Remove the white  label marked "2". Rub patch adhesive wings for 2 additional minutes.  While looking in a mirror, press and release button in center of patch. A small green light will  flash 3-4 times. This will be your only indicator that the monitor has been turned on.  Do not shower for the first 24 hours. You may shower after the first 24 hours.  Press the button if you feel a symptom. You will hear a small click. Record Date, Time and  Symptom in the Patient Logbook.  When you are ready to remove the patch, follow instructions on the last 2 pages of Patient  Logbook. Stick patch monitor onto the last page of Patient Logbook.  Place Patient Logbook in the blue and white box. Use locking tab on box and tape box closed  securely. The blue and white box has prepaid postage on it. Please place it in the mailbox as  soon as possible. Your physician should have your test results approximately 7 days after the  monitor has been mailed back to [facility].  Call [facility] Customer Care at [phone] if you have questions regarding  your ZIO XT patch monitor. Call them immediately if you see an orange light blinking on your  monitor.  If your monitor falls off in less than 4 days, contact our Monitor department at [phone].  If your monitor becomes loose or falls off after 4 days call [employer] at [phone] for  suggestions on securing your monitor     Signed, Christell Constant, MD  02/14/2022 10:44 AM    [facility]

## 2022-02-14 ENCOUNTER — Ambulatory Visit (INDEPENDENT_AMBULATORY_CARE_PROVIDER_SITE_OTHER): Payer: Medicare Other

## 2022-02-14 ENCOUNTER — Other Ambulatory Visit: Payer: Self-pay

## 2022-02-14 ENCOUNTER — Ambulatory Visit: Payer: Medicare Other | Admitting: Internal Medicine

## 2022-02-14 ENCOUNTER — Encounter: Payer: Self-pay | Admitting: Internal Medicine

## 2022-02-14 VITALS — BP 132/68 | HR 114 | Ht 61.0 in | Wt 153.0 lb

## 2022-02-14 DIAGNOSIS — N183 Chronic kidney disease, stage 3 unspecified: Secondary | ICD-10-CM

## 2022-02-14 DIAGNOSIS — I10 Essential (primary) hypertension: Secondary | ICD-10-CM

## 2022-02-14 DIAGNOSIS — I493 Ventricular premature depolarization: Secondary | ICD-10-CM

## 2022-02-14 DIAGNOSIS — I700 Atherosclerosis of aorta: Secondary | ICD-10-CM | POA: Diagnosis not present

## 2022-02-14 MED ORDER — FUROSEMIDE 40 MG PO TABS: 40.0000 mg | ORAL_TABLET | Freq: Every day | ORAL | 3 refills | Status: DC

## 2022-02-14 NOTE — Progress Notes (Unsigned)
Enrolled for [employer] to mail a ZIO XT long term holter monitor to the patients address on file.   Monitor serial # [id number] mailed to patient 02/14/22 and applied in office 03/15/22.

## 2022-02-14 NOTE — Patient Instructions (Signed)
Medication Instructions:  ?Your physician has recommended you make the following change in your medication: INCREASE: furosemide (Lasix) to 40 mg by mouth once a day ? ?*If you need a refill on your cardiac medications before your next appointment, please call your pharmacy* ? ? ?Lab Work: ?IN 10-14 DAYS: BMP ?If you have labs (blood work) drawn today and your tests are completely normal, you will receive your results only by: ?MyChart Message (if you have MyChart) OR ?A paper copy in the mail ?If you have any lab test that is abnormal or we need to change your treatment, we will call you to review the results. ? ? ?Testing/Procedures: ?Your physician has requested that you wear a 14 day heart monitor.  ? ?Follow-Up: ?At [facility], you and your health needs are our priority.  As part of our continuing mission to provide you with exceptional heart care, we have created designated Provider Care Teams.  These Care Teams include your primary Cardiologist (physician) and Advanced Practice Providers (APPs -  Physician Assistants and Nurse Practitioners) who all work together to provide you with the care you need, when you need it. ? ?We recommend signing up for the patient portal called "MyChart".  Sign up information is provided on this After Visit Summary.  MyChart is used to connect with patients for Virtual Visits (Telemedicine).  Patients are able to view lab/test results, encounter notes, upcoming appointments, etc.  Non-urgent messages can be sent to your provider as well.   ?To learn more about what you can do with MyChart, go to [email or web address].   ? ?Your next appointment:   ?3 month(s) ? ?The format for your next appointment:   ?In Person ? ?Provider:   ?Christell Constant, MD   ? ? ?Other Instructions ?ZIO XT- Long Term Monitor Instructions ? ?Your physician has requested you wear a ZIO patch monitor for 14 days.  ?This is a single patch monitor. [employer] supplies one patch monitor per  enrollment. Additional ?stickers are not available. Please do not apply patch if you will be having a Nuclear Stress Test,  ?Echocardiogram, Cardiac CT, MRI, or Chest Xray during the period you would be wearing the  ?monitor. The patch cannot be worn during these tests. You cannot remove and re-apply the  ?ZIO XT patch monitor.  ?Your ZIO patch monitor will be mailed 3 day USPS to your address on file. It may take 3-5 days  ?to receive your monitor after you have been enrolled.  ?Once you have received your monitor, please review the enclosed instructions. Your monitor  ?has already been registered assigning a specific monitor serial # to you. ? ?Billing and Patient Assistance Program Information ? ?We have supplied [employer] with any of your insurance information on file for billing purposes. ?[employer] offers a sliding scale Patient Assistance Program for patients that do not have  ?insurance, or whose insurance does not completely cover the cost of the ZIO monitor.  ?You must apply for the Patient Assistance Program to qualify for this discounted rate.  ?To apply, please call [employer] at [phone], select option 4, select option 2, ask to apply for  ?Patient Assistance Program. Meredeth Ide will ask your household income, and how many people  ?are in your household. They will quote your out-of-pocket cost based on that information.  ?[employer] will also be able to set up a 52-month, interest-free payment plan if needed. ? ?Applying the monitor ?  ?Shave hair from upper left chest.  ?Hold abrader disc  by orange tab. Rub abrader in 40 strokes over the upper left chest as  ?indicated in your monitor instructions.  ?Clean area with 4 enclosed alcohol pads. Let dry.  ?Apply patch as indicated in monitor instructions. Patch will be placed under collarbone on left  ?side of chest with arrow pointing upward.  ?Rub patch adhesive wings for 2 minutes. Remove white label marked "1". Remove the white  ?label marked "2". Rub patch  adhesive wings for 2 additional minutes.  ?While looking in a mirror, press and release button in center of patch. A small green light will  ?flash 3-4 times. This will be your only indicator that the monitor has been turned on.  ?Do not shower for the first 24 hours. You may shower after the first 24 hours.  ?Press the button if you feel a symptom. You will hear a small click. Record Date, Time and  ?Symptom in the Patient Logbook.  ?When you are ready to remove the patch, follow instructions on the last 2 pages of Patient  ?Logbook. Stick patch monitor onto the last page of Patient Logbook.  ?Place Patient Logbook in the blue and white box. Use locking tab on box and tape box closed  ?securely. The blue and white box has prepaid postage on it. Please place it in the mailbox as  ?soon as possible. Your physician should have your test results approximately 7 days after the  ?monitor has been mailed back to [facility].  ?Call [facility] at [phone] if you have questions regarding  ?your ZIO XT patch monitor. Call them immediately if you see an orange light blinking on your  ?monitor.  ?If your monitor falls off in less than 4 days, contact our Monitor department at [phone].  ?If your monitor becomes loose or falls off after 4 days call [employer] at [phone] for  ?suggestions on securing your monitor   ?

## 2022-02-14 NOTE — Progress Notes (Incomplete)
Phone [phone] In person visit   Subjective:   Maria [name] is a 81 y.o. year old very pleasant female patient who presents for/with See problem oriented charting No chief complaint on file.   This visit occurred during the SARS-CoV-2 public health emergency.  Safety protocols were in place, including screening questions prior to the visit, additional usage of staff PPE, and extensive cleaning of exam room while observing appropriate contact time as indicated for disinfecting solutions.   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 8 g 0   amLODipine (NORVASC) 2.5 MG tablet Take 1 tablet (2.5 mg total) by mouth daily. 90 tablet 3   atorvastatin (LIPITOR) 40 MG tablet TAKE 1 TABLET(40 MG) BY MOUTH DAILY 90 tablet 1   Calcium Carbonate-Vitamin D (CALCIUM PLUS VITAMIN D PO) Take 2.5 tablets by mouth daily.      cyanocobalamin 100 MCG tablet Take 100 mcg by mouth daily.     Ferrous Gluconate-C-Folic Acid (IRON-C PO) Take by mouth. For women 65 mg     fluticasone (FLONASE) 50 MCG/ACT nasal spray Place 2 sprays into both nostrils daily. 16 g 3    furosemide (LASIX) 20 MG tablet Take 1 tablet (20 mg total) by mouth daily as needed for fluid or edema (as per Dr. Yong Channel). 30 tablet 0   metFORMIN (GLUCOPHAGE) 500 MG tablet TAKE 1 TABLET(500 MG) BY MOUTH DAILY WITH BREAKFAST 30 tablet 5   metoprolol succinate (TOPROL-XL) 25 MG 24 hr tablet TAKE 1 TABLET(25 MG) BY MOUTH DAILY 30 tablet 5   Multiple Vitamin (MULTI-DAY PO) Take by mouth. womens multivitiamin gummies 2 a day     omeprazole (PRILOSEC) 20 MG capsule TAKE 1 CAPSULE(20 MG) BY MOUTH DAILY 90 capsule 1   valsartan (DIOVAN) 80 MG tablet TAKE 1 TABLET(80 MG) BY MOUTH AT BEDTIME 90 tablet 0   No current facility-administered medications for this visit.     Objective:  There were no vitals taken for this visit. Gen: NAD, resting comfortably CV: RRR no murmurs rubs or gallops Lungs: CTAB no crackles, wheeze, rhonchi Abdomen: soft/nontender/nondistended/normal bowel sounds. No rebound or guarding.  Ext: no edema Skin: warm, dry Neuro: grossly normal, moves all extremities  ***    Assessment and Plan   ***alk phos slightly high- vit D and ggt okay. Echo also okay. Alk phos okay fasting  #Essential hypertension/CKD stage III with elevated microalbumin to creatinine ratio S: Compliant with amlodipine 2.5 mg (edema when increased from 10 mg), metoprolol 25 mg XR, valsartan 59m - Prior was on atenolol-chlorthalidone 50-25 mg but had worsened of renal function and issues maintaining potassium.  GFR ranged from high 30s to 50s *** since 2019 -  Patient had elevated microalbumin to creatinine ratio over 500 ratio November 11, 2020 °Home readings #s: *** °BP Readings from Last 3 Encounters:  °02/08/22 134/78  °01/24/22 140/80  °01/07/22 118/80  °A/P: *** ° °#Hyperlipidemia °S: Compliant with atorvastatin 40 mg. LDL goal at least under 100. °Lab Results  °Component Value Date  ° CHOL 139 12/17/2021  ° HDL 43.00 12/17/2021  ° LDLCALC 86 12/17/2021  ° LDLDIRECT 100.0 07/23/2018  ° TRIG 52.0  12/17/2021  ° CHOLHDL 3 12/17/2021  ° A/P: *** ° °#GERD °S: Compliant with omeprazole 20 mg. B12 last checked June 2022 °-history of severe GI bleed years ago so kept her on PPI despite CKD III °B12 levels related to PPI use: °Lab Results  °Component Value Date  ° VITAMINB12 783 06/04/2021  ° °A/P: ***  ° °#Specialist Team °1. Dr. Murphy orthopedics ° °# Hyperglycemia/insulin resistance/prediabetes °S: Medication: Metformin 500 mg with breakfast started when A1c reached 6.1 and patient also had elevated microalbumin to creatinine ratio °Exercise and diet- *** °Lab Results  °Component Value Date  ° HGBA1C 6.2 12/17/2021  ° HGBA1C 6.2 06/04/2021  ° HGBA1C 6.1 (H) 11/11/2020  ° ° A/P: *** ° ° °Recommended follow up: No follow-ups on file. °Future Appointments  °Date Time Provider Department Center  °02/14/2022  9:40 AM Chandrasekhar, Mahesh A, MD [facility]  °02/16/2022  4:00 PM Hunter, Stephen O, MD [facility]  °02/17/2022  3:50 PM [facility] 2 [facility]  °06/17/2022 10:40 AM Hunter, Stephen O, MD [facility]  °06/24/2022  8:00 AM Hunter, Stephen O, MD [facility]  °09/02/2022  8:00 AM [facility]  °12/19/2022  9:40 AM Hunter, Stephen O, MD [facility]  ° ° °Lab/Order associations: °No diagnosis found. ° °No orders of the defined types were placed in this encounter. ° ° °I,Jada Bradford,acting as a scribe for Stephen Hunter, MD.,have documented all relevant documentation on the behalf of Stephen Hunter, MD,as directed by  Stephen Hunter, MD while in the presence of Stephen Hunter, MD. ° °*** °Return precautions advised.  °Jada Bradford ° ° °

## 2022-02-16 ENCOUNTER — Ambulatory Visit (INDEPENDENT_AMBULATORY_CARE_PROVIDER_SITE_OTHER): Payer: Medicare Other | Admitting: Family Medicine

## 2022-02-16 ENCOUNTER — Encounter: Payer: Self-pay | Admitting: Family Medicine

## 2022-02-16 VITALS — BP 130/70 | HR 104 | Temp 97.7°F | Ht 61.0 in | Wt 149.2 lb

## 2022-02-16 DIAGNOSIS — I10 Essential (primary) hypertension: Secondary | ICD-10-CM | POA: Diagnosis not present

## 2022-02-16 DIAGNOSIS — E785 Hyperlipidemia, unspecified: Secondary | ICD-10-CM

## 2022-02-16 DIAGNOSIS — K219 Gastro-esophageal reflux disease without esophagitis: Secondary | ICD-10-CM

## 2022-02-16 DIAGNOSIS — I509 Heart failure, unspecified: Secondary | ICD-10-CM

## 2022-02-16 DIAGNOSIS — R739 Hyperglycemia, unspecified: Secondary | ICD-10-CM

## 2022-02-16 DIAGNOSIS — N183 Chronic kidney disease, stage 3 unspecified: Secondary | ICD-10-CM

## 2022-02-16 NOTE — Patient Instructions (Addendum)
Try half tablet of amlodipine 2.5 mg- so you will be taking just 1.25 mg in the morning. This medicine can make you swell more so hoping reducing it will cause you to swell less.  ?- update me with your blood pressure in 1 week once a day reading when you are relaxed ? ?Continue lasix 40 mg ? ?Recommended follow up: Return in about 6 weeks (around 03/30/2022) for followup or sooner if needed.Schedule b4 you leave.  ?

## 2022-02-17 ENCOUNTER — Encounter (HOSPITAL_COMMUNITY): Payer: Self-pay

## 2022-02-17 ENCOUNTER — Telehealth: Payer: Self-pay | Admitting: Family Medicine

## 2022-02-17 ENCOUNTER — Other Ambulatory Visit: Payer: Self-pay

## 2022-02-17 ENCOUNTER — Ambulatory Visit (HOSPITAL_COMMUNITY): Payer: Medicare Other | Attending: Cardiology

## 2022-02-17 DIAGNOSIS — R0600 Dyspnea, unspecified: Secondary | ICD-10-CM | POA: Diagnosis not present

## 2022-02-17 DIAGNOSIS — I517 Cardiomegaly: Secondary | ICD-10-CM | POA: Diagnosis not present

## 2022-02-17 MED ORDER — PERFLUTREN LIPID MICROSPHERE
1.0000 mL | INTRAVENOUS | Status: AC | PRN
  Administered 2022-02-17: 1 mL via INTRAVENOUS

## 2022-02-17 NOTE — H&P (View-Only) (Signed)
?Cardiology Office Note:   ? ?Date:  02/19/2022  ? ?ID:  Maria [name], DOB 1941-07-21, MRN [id number] ? ?PCP:  Marin Olp, MD ?  ?[facility] Providers ?Cardiologist:  Werner Lean, MD { ? ? ?Referring MD: Marin Olp, MD  ? ? ? ?History of Present Illness:   ? ?Maria [name] is a 81 y.o. female with a hx of HTN, aortic atherosclerosis and HLD, CKD stage IIIa who was seen by Dr. Gasper Sells for SOB found to have severely reduced LVEF now presenting for urgent follow-up. ? ?Was last seen by Dr. Loletha Grayer on 02/14/22 where she had been feeling better after being started on lasix by Dr. Yong Channel. She had no exertional symptoms at that time. Notably had a normal TTE in 2021 with LVEF 60-65%, G1DD, normal RV, trivial MR. 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 She now presents for urgent follow-up. ? ?Today, the patient is feeling better. Her shortness of breath has been improving with the increased dose of lasix. No orthopnea or PND. No current chest pain. Had episodes of chest pain after prolonged coughing when she was No exertional chest pain but does have dyspnea on exertion with inclines or walking a long distance. The exertional dyspnea has been going on for about month.  ? ?Past Medical History:  ?Diagnosis Date  ? Arthritis   ? Bronchitis   ? hx of  ? GERD (gastroesophageal reflux disease)   ? History of bleeding ulcers   ? History of gastrointestinal hemorrhage   ? now off aspirin  ? Hyperlipidemia   ? Hypertension   ? Osteoarthritis of right knee 06/03/2012  ? ? ?Past Surgical History:  ?Procedure Laterality Date  ? ABDOMINAL HYSTERECTOMY    ? nonmalignant reasons  ? APPENDECTOMY    ? TOTAL KNEE ARTHROPLASTY  05/30/2012  ? Procedure: TOTAL KNEE ARTHROPLASTY;  Surgeon: Ninetta Lights, MD;  Location: [facility] [facility];  Service: Orthopedics;  Laterality: Right;  DR MURPHY WANTS 90 MINUTES FOR THIS CASE. OSTEONICS  ?  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 Procedure: LEFT TOTAL KNEE ARTHROPLASTY;  Surgeon: Ninetta Lights, MD;  Location: [place];  Service: Orthopedics;  Laterality: Left;  ? ? ?Current Medications: ?Current Meds  ?Medication Sig  ? albuterol (VENTOLIN HFA) 108 (90 Base) MCG/ACT inhaler Inhale 2 puffs into the lungs every 6 (six) hours as needed for wheezing or shortness of breath.  ? aspirin EC 81 MG tablet Take 1 tablet (81 mg total) by mouth daily. Swallow whole.  ? atorvastatin (LIPITOR) 40 MG tablet TAKE 1 TABLET(40 MG) BY MOUTH DAILY  ? Calcium Carbonate-Vitamin D (CALCIUM PLUS VITAMIN D PO) Take 2.5 tablets by mouth daily.   ? cyanocobalamin 100 MCG tablet Take 100 mcg by mouth daily.  ? furosemide (LASIX) 40 MG tablet Take 1 tablet (40 mg total) by mouth twice daily for 3 days only, then decrease to taking 1 tablet (40 mg total) by mouth daily thereafter.  ? metFORMIN (GLUCOPHAGE) 500 MG tablet TAKE 1 TABLET(500 MG) BY MOUTH DAILY WITH BREAKFAST  ? metoprolol succinate (TOPROL-XL) 25 MG 24 hr tablet TAKE 1 TABLET(25 MG) BY MOUTH DAILY  ? Multiple Vitamin (MULTI-DAY PO) Take by mouth. womens multivitiamin gummies 2 a day  ? omeprazole (PRILOSEC) 20 MG capsule TAKE 1 CAPSULE(20 MG) BY MOUTH DAILY  ? potassium chloride SA (KLOR-CON M) 20 MEQ tablet Take 1 tablet (20 mEq total)  by mouth twice daily for 3 days only, then decrease to taking 1 tablet (20 mEq total) by mouth daily thereafter.  ? sacubitril-valsartan (ENTRESTO) 24-26 MG Take 1 tablet by mouth 2 (two) times daily.  ? [DISCONTINUED] amLODipine (NORVASC) 2.5 MG tablet Take 1 tablet (2.5 mg total) by mouth daily.  ? [DISCONTINUED] furosemide (LASIX) 40 MG tablet Take 40 mg by mouth. Pt. Takes 80 mg 2 tablets once daily.  ? [DISCONTINUED] valsartan (DIOVAN) 80 MG tablet TAKE 1 TABLET(80 MG) BY MOUTH AT BEDTIME  ?  ? ?Allergies:   Coumadin [warfarin sodium], Methocarbamol, and Norco [hydrocodone-acetaminophen]  ? ?Social History  ? ?Socioeconomic History   ? Marital status: Divorced  ?  Spouse name: Not on file  ? Number of children: Not on file  ? Years of education: Not on file  ? Highest education level: Not on file  ?Occupational History  ? Occupation: day care   ?Tobacco Use  ? Smoking status: Never  ? Smokeless tobacco: Never  ?Substance and Sexual Activity  ? Alcohol use: No  ? Drug use: No  ? Sexual activity: Not on file  ?Other Topics Concern  ? Not on file  ?Social History Narrative  ? Lives alone. Great granddaughter stays with her on weekends.   ? Divorced 20.   ?   ? Still doing childcare  ? Retired from [employer]  ? ?Social Determinants of Health  ? ?Financial Resource Strain: Low Risk   ? Difficulty of Paying Living Expenses: Not hard at all  ?Food Insecurity: No Food Insecurity  ? Worried About Charity fundraiser in the Last Year: Never true  ? Ran Out of Food in the Last Year: Never true  ?Transportation Needs: No Transportation Needs  ? Lack of Transportation (Medical): No  ? Lack of Transportation (Non-Medical): No  ?Physical Activity: Inactive  ? Days of Exercise per Week: 0 days  ? Minutes of Exercise per Session: 0 min  ?Stress: No Stress Concern Present  ? Feeling of Stress : Not at all  ?Social Connections: Moderately Isolated  ? Frequency of Communication with Friends and Family: More than three times a week  ? Frequency of Social Gatherings with Friends and Family: More than three times a week  ? Attends Religious Services: More than 4 times per year  ? Active Member of Clubs or Organizations: No  ? Attends Archivist Meetings: Never  ? Marital Status: Divorced  ?  ? ?Family History: ?The patient's family history includes Congestive Heart Failure in her mother; Other in her father. ? ?ROS:   ?Please see the history of present illness.    ?Review of Systems  ?Constitutional:  Positive for malaise/fatigue.  ?Respiratory:  Positive for cough and shortness of breath.   ?Cardiovascular:  Positive for chest pain and leg swelling.  Negative for palpitations, orthopnea, claudication and PND.  ?Gastrointestinal:  Negative for blood in stool and melena.  ?Genitourinary:  Negative for flank pain.  ?Musculoskeletal:  Negative for falls.  ?Neurological:  Negative for dizziness and loss of consciousness.   ? ?EKGs/Labs/Other Studies Reviewed:   ? ?The following studies were reviewed today: ?TTE 02/17/22: ?LVEF 20-25%, moderate-to-severe MR, moderate TR, RAP 26mmHg.  ? ?TTE 04/2020: ?IMPRESSIONS  ? 1. Left ventricular ejection fraction, by estimation, is 60 to 65%. The  ?left ventricle has normal function. The left ventricle has no regional  ?wall motion abnormalities. Left ventricular diastolic parameters are  ?consistent with Grade I diastolic  ?dysfunction (  impaired relaxation).  ? 2. Right ventricular systolic function is normal. The right ventricular  ?size is normal. There is normal pulmonary artery systolic pressure. The  ?estimated right ventricular systolic pressure is 99991111 mmHg.  ? 3. The mitral valve is normal in structure. Trivial mitral valve  ?regurgitation. No evidence of mitral stenosis.  ? 4. The aortic valve is tricuspid. Aortic valve regurgitation is not  ?visualized. Mild aortic valve sclerosis is present, with no evidence of  ?aortic valve stenosis.  ? 5. The inferior vena cava is normal in size with greater than 50%  ?respiratory variability, suggesting right atrial pressure of 3 mmHg.  ? ?EKG:  No new ECG ? ?Recent Labs: ?01/07/2022: Brain Natriuretic Peptide CANCELED ?02/08/2022: ALT 46; BUN 22; Creatinine, Ser 1.48; Hemoglobin 11.4; Platelets 173.0; Potassium 4.4; Sodium 142; TSH 0.91 ?02/10/2022: Pro B Natriuretic peptide (BNP) 1,994.0  ?Recent Lipid Panel ?   ?Component Value Date/Time  ? CHOL 139 12/17/2021 1033  ? TRIG 52.0 12/17/2021 1033  ? HDL 43.00 12/17/2021 1033  ? CHOLHDL 3 12/17/2021 1033  ? VLDL 10.4 12/17/2021 1033  ? [place] 86 12/17/2021 1033  ? [place] 89 11/11/2020 0939  ? LDLDIRECT 100.0 07/23/2018 1108  ? ? ? ?     ? ?Physical Exam:   ? ?VS:  BP 112/62   Pulse 99   Ht 5\' 1"  (1.549 m)   Wt 150 lb 12.8 oz (68.4 kg)   SpO2 99%   BMI 28.49 kg/m?    ? ?Wt Readings from Last 3 Encounters:  ?02/18/22 150 lb 12.8 oz (

## 2022-02-17 NOTE — Progress Notes (Signed)
Urgent findings reported to Dr. Shari Prows ?Urgent findings reported time 14:15 ?Urgent findings acknowledge Yes ?Disposition of patient at discharge (if patient is discharged) Patient stable to be discharged ?

## 2022-02-17 NOTE — Progress Notes (Signed)
?Cardiology Office Note:   ? ?Date:  02/19/2022  ? ?ID:  PRUDENCE FOGELSON, DOB July 01, 1941, MRN [id number] ? ?PCP:  Marin Olp, MD ?  ?[facility] Providers ?Cardiologist:  Werner Lean, MD { ? ? ?Referring MD: Marin Olp, MD  ? ? ? ?History of Present Illness:   ? ?Maria [name] is a 81 y.o. female with a hx of HTN, aortic atherosclerosis and HLD, CKD stage IIIa who was seen by Dr. Gasper Sells for SOB found to have severely reduced LVEF now presenting for urgent follow-up. ? ?Was last seen by Dr. Loletha Grayer on 02/14/22 where she had been feeling better after being started on lasix by Dr. Yong Channel. She had no exertional symptoms at that time. Notably had a normal TTE in 2021 with LVEF 60-65%, G1DD, normal RV, trivial MR. 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 She now presents for urgent follow-up. ? ?Today, the patient is feeling better. Her shortness of breath has been improving with the increased dose of lasix. No orthopnea or PND. No current chest pain. Had episodes of chest pain after prolonged coughing when she was No exertional chest pain but does have dyspnea on exertion with inclines or walking a long distance. The exertional dyspnea has been going on for about month.  ? ?Past Medical History:  ?Diagnosis Date  ? Arthritis   ? Bronchitis   ? hx of  ? GERD (gastroesophageal reflux disease)   ? History of bleeding ulcers   ? History of gastrointestinal hemorrhage   ? now off aspirin  ? Hyperlipidemia   ? Hypertension   ? Osteoarthritis of right knee 06/03/2012  ? ? ?Past Surgical History:  ?Procedure Laterality Date  ? ABDOMINAL HYSTERECTOMY    ? nonmalignant reasons  ? APPENDECTOMY    ? TOTAL KNEE ARTHROPLASTY  05/30/2012  ? Procedure: TOTAL KNEE ARTHROPLASTY;  Surgeon: Ninetta Lights, MD;  Location: [place];  Service: Orthopedics;  Laterality: Right;  DR MURPHY WANTS 90 MINUTES FOR THIS CASE. OSTEONICS  ?  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 Procedure: LEFT TOTAL KNEE ARTHROPLASTY;  Surgeon: Ninetta Lights, MD;  Location: [place];  Service: Orthopedics;  Laterality: Left;  ? ? ?Current Medications: ?Current Meds  ?Medication Sig  ? albuterol (VENTOLIN HFA) 108 (90 Base) MCG/ACT inhaler Inhale 2 puffs into the lungs every 6 (six) hours as needed for wheezing or shortness of breath.  ? aspirin EC 81 MG tablet Take 1 tablet (81 mg total) by mouth daily. Swallow whole.  ? atorvastatin (LIPITOR) 40 MG tablet TAKE 1 TABLET(40 MG) BY MOUTH DAILY  ? Calcium Carbonate-Vitamin D (CALCIUM PLUS VITAMIN D PO) Take 2.5 tablets by mouth daily.   ? cyanocobalamin 100 MCG tablet Take 100 mcg by mouth daily.  ? furosemide (LASIX) 40 MG tablet Take 1 tablet (40 mg total) by mouth twice daily for 3 days only, then decrease to taking 1 tablet (40 mg total) by mouth daily thereafter.  ? metFORMIN (GLUCOPHAGE) 500 MG tablet TAKE 1 TABLET(500 MG) BY MOUTH DAILY WITH BREAKFAST  ? metoprolol succinate (TOPROL-XL) 25 MG 24 hr tablet TAKE 1 TABLET(25 MG) BY MOUTH DAILY  ? Multiple Vitamin (MULTI-DAY PO) Take by mouth. womens multivitiamin gummies 2 a day  ? omeprazole (PRILOSEC) 20 MG capsule TAKE 1 CAPSULE(20 MG) BY MOUTH DAILY  ? potassium chloride SA (KLOR-CON M) 20 MEQ tablet Take 1 tablet (20 mEq total)  by mouth twice daily for 3 days only, then decrease to taking 1 tablet (20 mEq total) by mouth daily thereafter.  ? sacubitril-valsartan (ENTRESTO) 24-26 MG Take 1 tablet by mouth 2 (two) times daily.  ? [DISCONTINUED] amLODipine (NORVASC) 2.5 MG tablet Take 1 tablet (2.5 mg total) by mouth daily.  ? [DISCONTINUED] furosemide (LASIX) 40 MG tablet Take 40 mg by mouth. Pt. Takes 80 mg 2 tablets once daily.  ? [DISCONTINUED] valsartan (DIOVAN) 80 MG tablet TAKE 1 TABLET(80 MG) BY MOUTH AT BEDTIME  ?  ? ?Allergies:   Coumadin [warfarin sodium], Methocarbamol, and Norco [hydrocodone-acetaminophen]  ? ?Social History  ? ?Socioeconomic History   ? Marital status: Divorced  ?  Spouse name: Not on file  ? Number of children: Not on file  ? Years of education: Not on file  ? Highest education level: Not on file  ?Occupational History  ? Occupation: day care   ?Tobacco Use  ? Smoking status: Never  ? Smokeless tobacco: Never  ?Substance and Sexual Activity  ? Alcohol use: No  ? Drug use: No  ? Sexual activity: Not on file  ?Other Topics Concern  ? Not on file  ?Social History Narrative  ? Lives alone. Great granddaughter stays with her on weekends.   ? Divorced 58.   ?   ? Still doing childcare  ? Retired from [employer]  ? ?Social Determinants of Health  ? ?Financial Resource Strain: Low Risk   ? Difficulty of Paying Living Expenses: Not hard at all  ?Food Insecurity: No Food Insecurity  ? Worried About Charity fundraiser in the Last Year: Never true  ? Ran Out of Food in the Last Year: Never true  ?Transportation Needs: No Transportation Needs  ? Lack of Transportation (Medical): No  ? Lack of Transportation (Non-Medical): No  ?Physical Activity: Inactive  ? Days of Exercise per Week: 0 days  ? Minutes of Exercise per Session: 0 min  ?Stress: No Stress Concern Present  ? Feeling of Stress : Not at all  ?Social Connections: Moderately Isolated  ? Frequency of Communication with Friends and Family: More than three times a week  ? Frequency of Social Gatherings with Friends and Family: More than three times a week  ? Attends Religious Services: More than 4 times per year  ? Active Member of Clubs or Organizations: No  ? Attends Archivist Meetings: Never  ? Marital Status: Divorced  ?  ? ?Family History: ?The patient's family history includes Congestive Heart Failure in her mother; Other in her father. ? ?ROS:   ?Please see the history of present illness.    ?Review of Systems  ?Constitutional:  Positive for malaise/fatigue.  ?Respiratory:  Positive for cough and shortness of breath.   ?Cardiovascular:  Positive for chest pain and leg swelling.  Negative for palpitations, orthopnea, claudication and PND.  ?Gastrointestinal:  Negative for blood in stool and melena.  ?Genitourinary:  Negative for flank pain.  ?Musculoskeletal:  Negative for falls.  ?Neurological:  Negative for dizziness and loss of consciousness.   ? ?EKGs/Labs/Other Studies Reviewed:   ? ?The following studies were reviewed today: ?TTE 02/17/22: ?LVEF 20-25%, moderate-to-severe MR, moderate TR, RAP 70mmHg.  ? ?TTE 04/2020: ?IMPRESSIONS  ? 1. Left ventricular ejection fraction, by estimation, is 60 to 65%. The  ?left ventricle has normal function. The left ventricle has no regional  ?wall motion abnormalities. Left ventricular diastolic parameters are  ?consistent with Grade I diastolic  ?dysfunction (  impaired relaxation).  ? 2. Right ventricular systolic function is normal. The right ventricular  ?size is normal. There is normal pulmonary artery systolic pressure. The  ?estimated right ventricular systolic pressure is 99991111 mmHg.  ? 3. The mitral valve is normal in structure. Trivial mitral valve  ?regurgitation. No evidence of mitral stenosis.  ? 4. The aortic valve is tricuspid. Aortic valve regurgitation is not  ?visualized. Mild aortic valve sclerosis is present, with no evidence of  ?aortic valve stenosis.  ? 5. The inferior vena cava is normal in size with greater than 50%  ?respiratory variability, suggesting right atrial pressure of 3 mmHg.  ? ?EKG:  No new ECG ? ?Recent Labs: ?01/07/2022: Brain Natriuretic Peptide CANCELED ?02/08/2022: ALT 46; BUN 22; Creatinine, Ser 1.48; Hemoglobin 11.4; Platelets 173.0; Potassium 4.4; Sodium 142; TSH 0.91 ?02/10/2022: Pro B Natriuretic peptide (BNP) 1,994.0  ?Recent Lipid Panel ?   ?Component Value Date/Time  ? CHOL 139 12/17/2021 1033  ? TRIG 52.0 12/17/2021 1033  ? HDL 43.00 12/17/2021 1033  ? CHOLHDL 3 12/17/2021 1033  ? VLDL 10.4 12/17/2021 1033  ? [place] 86 12/17/2021 1033  ? [place] 89 11/11/2020 0939  ? LDLDIRECT 100.0 07/23/2018 1108  ? ? ? ?     ? ?Physical Exam:   ? ?VS:  BP 112/62   Pulse 99   Ht 5\' 1"  (1.549 m)   Wt 150 lb 12.8 oz (68.4 kg)   SpO2 99%   BMI 28.49 kg/m?    ? ?Wt Readings from Last 3 Encounters:  ?02/18/22 150 lb 12.8 oz (

## 2022-02-17 NOTE — Telephone Encounter (Signed)
Ran this by Dr. Durene Cal and he advised pt to take this for now and if her BP gets below 120 then he will have her discontinue the med. Called and spoke with pt and made her aware.  ?

## 2022-02-17 NOTE — Telephone Encounter (Signed)
Can you please call patient back - dr hunter asked her to cut pill in half (at 02/16/22 apt) but pt cannot cut pill in half it is too little to do so.  ?

## 2022-02-18 ENCOUNTER — Telehealth: Payer: Self-pay | Admitting: Cardiology

## 2022-02-18 ENCOUNTER — Telehealth: Payer: Self-pay | Admitting: Family Medicine

## 2022-02-18 ENCOUNTER — Encounter: Payer: Self-pay | Admitting: Cardiology

## 2022-02-18 ENCOUNTER — Ambulatory Visit: Payer: Medicare Other | Admitting: Cardiology

## 2022-02-18 VITALS — BP 112/62 | HR 99 | Ht 61.0 in | Wt 150.8 lb

## 2022-02-18 DIAGNOSIS — E785 Hyperlipidemia, unspecified: Secondary | ICD-10-CM | POA: Diagnosis not present

## 2022-02-18 DIAGNOSIS — Z01812 Encounter for preprocedural laboratory examination: Secondary | ICD-10-CM | POA: Diagnosis not present

## 2022-02-18 DIAGNOSIS — I509 Heart failure, unspecified: Secondary | ICD-10-CM

## 2022-02-18 DIAGNOSIS — N183 Chronic kidney disease, stage 3 unspecified: Secondary | ICD-10-CM

## 2022-02-18 DIAGNOSIS — Z79899 Other long term (current) drug therapy: Secondary | ICD-10-CM

## 2022-02-18 MED ORDER — FUROSEMIDE 40 MG PO TABS: ORAL_TABLET | ORAL | 0 refills | Status: DC

## 2022-02-18 MED ORDER — ASPIRIN EC 81 MG PO TBEC: 81.0000 mg | DELAYED_RELEASE_TABLET | Freq: Every day | ORAL | 3 refills | Status: DC

## 2022-02-18 MED ORDER — POTASSIUM CHLORIDE CRYS ER 20 MEQ PO TBCR: EXTENDED_RELEASE_TABLET | ORAL | 0 refills | Status: DC

## 2022-02-18 MED ORDER — ENTRESTO 24-26 MG PO TABS: 1.0000 | ORAL_TABLET | Freq: Two times a day (BID) | ORAL | 1 refills | Status: DC

## 2022-02-18 NOTE — Patient Instructions (Addendum)
Medication Instructions:  ? ?STOP TAKING VALSARTAN NOW ? ?STOP TAKING AMLODIPINE NOW ? ?START TAKING ENTRESTO 24/26 MG DOSE--TAKE 1 TABLET BY MOUTH TWICE DAILY ? ?START TAKING POTASSIUM CHLORIDE 20 mEq BY MOUTH TWICE DAILY FOR 3 DAYS ONLY, THEN DECREASE TO TAKING 1 TABLET BY MOUTH DAILY THEREAFTER. ? ?INCREASE YOUR LASIX 40 MG BY MOUTH TWICE DAILY FOR 3 DAYS ONLY, THEN DECREASE TO TAKING 1 TABLET BY MOUTH DAILY THEREAFTER.  ? ?*If you need a refill on your cardiac medications before your next appointment, please call your pharmacy* ? ? ?Lab Work: ? ?NEXT Tuesday 02/22/22--PRE-PROCEDURE LABS--CHECK BMET, PRO-BNP, AND CBC W DIFF--OK TO CANCEL LAB APPOINTMENT SCHEDULED FOR Thursday 3/23 ? ?If you have labs (blood work) drawn today and your tests are completely normal, you will receive your results only by: ?MyChart Message (if you have MyChart) OR ?A paper copy in the mail ?If you have any lab test that is abnormal or we need to change your treatment, we will call you to review the results. ? ? ?Testing/Procedures: ? ? ?[facility] ?[facility] ?[address], [address] ?[place] [address] ?Dept: [phone] ?Loc: [phone] ? ?Maria [name]  02/18/2022 ? ?You are scheduled for a Cardiac Catheterization on Friday, March 24 with Dr. Verne Carrow. ? ?1. Please arrive at the Main Entrance A at [facility]: [address] [place], [place] [address] at 7:00 AM (This time is two hours before your procedure to ensure your preparation). Free valet parking service is available.  ? ?Special note: Every effort is made to have your procedure done on time. Please understand that emergencies sometimes delay scheduled procedures. ? ?2. Diet: Do not eat solid foods after midnight.  You may have clear liquids until 5 AM upon the day of the procedure. ? ?3. Labs: You will need to have blood drawn on Tuesday, March 21 at [facility] at  [facility]. [address], [place]  ?Open: 7:30am - 5pm    Phone: [phone]. You do not need to be fasting. ? ?4. Medication instructions in preparation for your procedure: ? ? Contrast Allergy: No ? ? ?Stop taking, Fluid pill and potassium  Friday, March 24, ? ? ?Do not take Diabetes Med Glucophage (Metformin) on the day of the procedure and HOLD 48 HOURS AFTER THE PROCEDURE. ? ?On the morning of your procedure, take Aspirin and any morning medicines NOT listed above.  You may use sips of water. ? ?5. Plan to go home the same day, you will only stay overnight if medically necessary. ?6. You MUST have a responsible adult to drive you home. ?7. An adult MUST be with you the first 24 hours after you arrive home. ?8. Bring a current list of your medications, and the last time and date medication taken. ?9. Bring ID and current insurance cards. ?10.Please wear clothes that are easy to get on and off and wear slip-on shoes. ? ?Thank you for allowing [place] to care for you! ?  -- [facility] ? ? ? ?Follow-Up: ? ?2-3 WEEKS WITH DR. CHANDRASEKHAR IN THE OFFICE FOR POST-CATH FOLLOW-UP AND MEDICATION MANAGEMENT/TITRATION OF ENTRESTO ? ? ? ?

## 2022-02-18 NOTE — Telephone Encounter (Signed)
Pt states she was to inform Dr Durene Cal about her bp. Today it is 112/62. ?

## 2022-02-18 NOTE — Telephone Encounter (Signed)
Noted. Will send message to Dr. Durene Cal. ?

## 2022-02-18 NOTE — Telephone Encounter (Signed)
Patient calling to speak with Lajoyce Corners about her appointment today.  ? ?

## 2022-02-18 NOTE — Telephone Encounter (Signed)
Pt was just calling to go over med changes made from her OV today and on her AVS.  ?Went over med changes and the AVS with the pt on the phone. ?Pt states she's good on her end and completely understands all instructions provided.   ?Pt verbalized understanding and was gracious for all the assistance provided.  ?

## 2022-02-19 LAB — ECHOCARDIOGRAM COMPLETE: S' Lateral: 4 cm

## 2022-02-21 ENCOUNTER — Telehealth: Payer: Self-pay | Admitting: Family Medicine

## 2022-02-21 NOTE — Telephone Encounter (Signed)
Pt states she is having a Cardiac Cath on 02/25/22. She is wanting to make sure Dr Durene Cal knows what is going on. She is asking him to get in touch with Dr Shari Prows and Dr Izora Ribas. She states she will not go forward until Dr Durene Cal give the ok. Please advise ?

## 2022-02-21 NOTE — Telephone Encounter (Signed)
I am 100% in support of catheterization to understand why her pumping function on her heart has reduced better- and at least rule out heart disease/blockages as cause ?

## 2022-02-21 NOTE — Telephone Encounter (Signed)
That looks great- let me know if home readings get above 135/85 ?

## 2022-02-21 NOTE — Telephone Encounter (Signed)
Patient aware of PCP recommendations  

## 2022-02-21 NOTE — Telephone Encounter (Signed)
Patient states that she will do whatever Dr. Durene Cal thinks is best and that she knows he cannot be there physically with her on Friday, but will be expecting prayers and a phone call Friday afternoon.  ?

## 2022-02-22 ENCOUNTER — Other Ambulatory Visit: Payer: Medicare Other | Admitting: *Deleted

## 2022-02-22 ENCOUNTER — Other Ambulatory Visit: Payer: Self-pay

## 2022-02-22 DIAGNOSIS — Z01812 Encounter for preprocedural laboratory examination: Secondary | ICD-10-CM | POA: Diagnosis not present

## 2022-02-22 DIAGNOSIS — E785 Hyperlipidemia, unspecified: Secondary | ICD-10-CM | POA: Diagnosis not present

## 2022-02-22 DIAGNOSIS — Z79899 Other long term (current) drug therapy: Secondary | ICD-10-CM | POA: Diagnosis not present

## 2022-02-22 DIAGNOSIS — N183 Chronic kidney disease, stage 3 unspecified: Secondary | ICD-10-CM

## 2022-02-22 DIAGNOSIS — I509 Heart failure, unspecified: Secondary | ICD-10-CM | POA: Diagnosis not present

## 2022-02-23 LAB — CBC WITH DIFFERENTIAL/PLATELET
Basophils Absolute: 0.1 10*3/uL (ref 0.0–0.2)
Basos: 1 %
EOS (ABSOLUTE): 0.3 10*3/uL (ref 0.0–0.4)
Eos: 4 %
Hematocrit: 39.2 % (ref 34.0–46.6)
Hemoglobin: 12.3 g/dL (ref 11.1–15.9)
Immature Grans (Abs): 0 10*3/uL (ref 0.0–0.1)
Immature Granulocytes: 0 %
Lymphocytes Absolute: 1.7 10*3/uL (ref 0.7–3.1)
Lymphs: 28 %
MCH: 26.7 pg (ref 26.6–33.0)
MCHC: 31.4 g/dL — ABNORMAL LOW (ref 31.5–35.7)
MCV: 85 fL (ref 79–97)
Monocytes Absolute: 0.7 10*3/uL (ref 0.1–0.9)
Monocytes: 11 %
Neutrophils Absolute: 3.4 10*3/uL (ref 1.4–7.0)
Neutrophils: 56 %
Platelets: 247 10*3/uL (ref 150–450)
RBC: 4.61 x10E6/uL (ref 3.77–5.28)
RDW: 14.2 % (ref 11.7–15.4)
WBC: 6.1 10*3/uL (ref 3.4–10.8)

## 2022-02-23 LAB — BASIC METABOLIC PANEL
BUN/Creatinine Ratio: 9 — ABNORMAL LOW (ref 12–28)
BUN: 14 mg/dL (ref 8–27)
CO2: 29 mmol/L (ref 20–29)
Calcium: 9.5 mg/dL (ref 8.7–10.3)
Chloride: 103 mmol/L (ref 96–106)
Creatinine, Ser: 1.49 mg/dL — ABNORMAL HIGH (ref 0.57–1.00)
Glucose: 99 mg/dL (ref 70–99)
Potassium: 4.6 mmol/L (ref 3.5–5.2)
Sodium: 143 mmol/L (ref 134–144)
eGFR: 35 mL/min/{1.73_m2} — ABNORMAL LOW (ref >59)

## 2022-02-23 LAB — PRO B NATRIURETIC PEPTIDE: NT-Pro BNP: 16027 pg/mL — ABNORMAL HIGH (ref 0–738)

## 2022-02-24 ENCOUNTER — Telehealth: Payer: Self-pay | Admitting: *Deleted

## 2022-02-24 ENCOUNTER — Other Ambulatory Visit: Payer: Medicare Other

## 2022-02-24 NOTE — Telephone Encounter (Signed)
Cardiac Catheterization scheduled at [facility] for: Friday February 25, 2022 9 AM ?Arrival time and place: [place] Entrance A at: 7 AM  ? ? ?No solid food after midnight prior to cath, clear liquids until 5 AM day of procedure. ? ?Medication instructions: ? Hold: ? Entresto-none PM prior and AM of procedure-per protocol -GFR 35 ? Lasix-KCl- AM of procedure ?Metformin-day of procedure and 48 hours post procedure ?  ?-Usual morning medications can be taken with sips of water including aspirin 81 mg. ? ?Confirmed patient has responsible adult to drive home post procedure and be with patient first 24 hours after arriving home. ? ?Patient reports no new symptoms concerning for COVID-19/no exposure to COVID-19 in the past 10 days. ? ?Reviewed procedure instructions with patient. ? ?

## 2022-02-25 ENCOUNTER — Encounter (HOSPITAL_COMMUNITY)
Admission: RE | Disposition: A | Discharge status: Home / Self Care | Payer: Medicare Other | Source: Home / Self Care | Attending: Cardiovascular Disease

## 2022-02-25 ENCOUNTER — Other Ambulatory Visit: Payer: Self-pay

## 2022-02-25 ENCOUNTER — Encounter (HOSPITAL_COMMUNITY): Payer: Self-pay | Admitting: Cardiovascular Disease

## 2022-02-25 ENCOUNTER — Ambulatory Visit (HOSPITAL_COMMUNITY)
Admission: RE | Admit: 2022-02-25 | Discharge: 2022-02-25 | Disposition: A | Discharge status: Home / Self Care | Payer: Medicare Other | Attending: Cardiovascular Disease | Admitting: Cardiovascular Disease

## 2022-02-25 DIAGNOSIS — Z79899 Other long term (current) drug therapy: Secondary | ICD-10-CM | POA: Insufficient documentation

## 2022-02-25 DIAGNOSIS — R0602 Shortness of breath: Secondary | ICD-10-CM | POA: Insufficient documentation

## 2022-02-25 DIAGNOSIS — Z7982 Long term (current) use of aspirin: Secondary | ICD-10-CM | POA: Diagnosis not present

## 2022-02-25 DIAGNOSIS — I700 Atherosclerosis of aorta: Secondary | ICD-10-CM | POA: Diagnosis not present

## 2022-02-25 DIAGNOSIS — N1831 Chronic kidney disease, stage 3a: Secondary | ICD-10-CM | POA: Insufficient documentation

## 2022-02-25 DIAGNOSIS — Z7984 Long term (current) use of oral hypoglycemic drugs: Secondary | ICD-10-CM | POA: Diagnosis not present

## 2022-02-25 DIAGNOSIS — I5022 Chronic systolic (congestive) heart failure: Secondary | ICD-10-CM | POA: Diagnosis not present

## 2022-02-25 DIAGNOSIS — E785 Hyperlipidemia, unspecified: Secondary | ICD-10-CM | POA: Diagnosis not present

## 2022-02-25 DIAGNOSIS — I428 Other cardiomyopathies: Secondary | ICD-10-CM | POA: Diagnosis not present

## 2022-02-25 DIAGNOSIS — I130 Hypertensive heart and chronic kidney disease with heart failure and stage 1 through stage 4 chronic kidney disease, or unspecified chronic kidney disease: Secondary | ICD-10-CM | POA: Insufficient documentation

## 2022-02-25 HISTORY — PX: RIGHT/LEFT HEART CATH AND CORONARY ANGIOGRAPHY: CATH118266

## 2022-02-25 LAB — POCT I-STAT 7, (LYTES, BLD GAS, ICA,H+H)
Acid-Base Excess: 3 mmol/L — ABNORMAL HIGH (ref 0.0–2.0)
Bicarbonate: 28.3 mmol/L — ABNORMAL HIGH (ref 20.0–28.0)
Calcium, Ion: 1.13 mmol/L — ABNORMAL LOW (ref 1.15–1.40)
HCT: 34 % — ABNORMAL LOW (ref 36.0–46.0)
Hemoglobin: 11.6 g/dL — ABNORMAL LOW (ref 12.0–15.0)
O2 Saturation: 92 %
Potassium: 3.7 mmol/L (ref 3.5–5.1)
Sodium: 141 mmol/L (ref 135–145)
TCO2: 30 mmol/L (ref 22–32)
pCO2 arterial: 42.9 mmHg (ref 32–48)
pH, Arterial: 7.428 (ref 7.35–7.45)
pO2, Arterial: 62 mmHg — ABNORMAL LOW (ref 83–108)

## 2022-02-25 LAB — POCT I-STAT EG7
Acid-Base Excess: 3 mmol/L — ABNORMAL HIGH (ref 0.0–2.0)
Bicarbonate: 29 mmol/L — ABNORMAL HIGH (ref 20.0–28.0)
Calcium, Ion: 1.07 mmol/L — ABNORMAL LOW (ref 1.15–1.40)
HCT: 33 % — ABNORMAL LOW (ref 36.0–46.0)
Hemoglobin: 11.2 g/dL — ABNORMAL LOW (ref 12.0–15.0)
O2 Saturation: 67 %
Potassium: 3.6 mmol/L (ref 3.5–5.1)
Sodium: 143 mmol/L (ref 135–145)
TCO2: 30 mmol/L (ref 22–32)
pCO2, Ven: 48 mmHg (ref 44–60)
pH, Ven: 7.389 (ref 7.25–7.43)
pO2, Ven: 36 mmHg (ref 32–45)

## 2022-02-25 SURGERY — RIGHT/LEFT HEART CATH AND CORONARY ANGIOGRAPHY
Anesthesia: LOCAL

## 2022-02-25 MED ORDER — ASPIRIN 81 MG PO CHEW: 81.0000 mg | CHEWABLE_TABLET | ORAL | Status: DC

## 2022-02-25 MED ORDER — HEPARIN (PORCINE) IN NACL 1000-0.9 UT/500ML-% IV SOLN
INTRAVENOUS | Status: AC
  Filled 2022-02-25: qty 1000

## 2022-02-25 MED ORDER — SODIUM CHLORIDE 0.9 % IV SOLN
250.0000 mL | INTRAVENOUS | Status: DC | PRN
Start: 2022-02-25 — End: 2022-02-25

## 2022-02-25 MED ORDER — SODIUM CHLORIDE 0.9% FLUSH: 3.0000 mL | Freq: Two times a day (BID) | INTRAVENOUS | Status: DC

## 2022-02-25 MED ORDER — HEPARIN SODIUM (PORCINE) 1000 UNIT/ML IJ SOLN
INTRAMUSCULAR | Status: AC
  Filled 2022-02-25: qty 10

## 2022-02-25 MED ORDER — IOHEXOL 350 MG/ML SOLN
INTRAVENOUS | Status: DC | PRN
Start: 2022-02-25 — End: 2022-02-25
  Administered 2022-02-25: 40 mL

## 2022-02-25 MED ORDER — MIDAZOLAM HCL 2 MG/2ML IJ SOLN
INTRAMUSCULAR | Status: DC | PRN
  Administered 2022-02-25: 1 mg via INTRAVENOUS

## 2022-02-25 MED ORDER — MIDAZOLAM HCL 2 MG/2ML IJ SOLN
INTRAMUSCULAR | Status: AC
  Filled 2022-02-25: qty 2

## 2022-02-25 MED ORDER — FENTANYL CITRATE (PF) 100 MCG/2ML IJ SOLN
INTRAMUSCULAR | Status: AC
  Filled 2022-02-25: qty 2

## 2022-02-25 MED ORDER — HEPARIN SODIUM (PORCINE) 1000 UNIT/ML IJ SOLN
INTRAMUSCULAR | Status: DC | PRN
  Administered 2022-02-25: 3500 [IU] via INTRAVENOUS

## 2022-02-25 MED ORDER — SODIUM CHLORIDE 0.9 % IV SOLN: 250.0000 mL | INTRAVENOUS | Status: DC | PRN

## 2022-02-25 MED ORDER — HYDRALAZINE HCL 20 MG/ML IJ SOLN: 10.0000 mg | INTRAMUSCULAR | Status: DC | PRN

## 2022-02-25 MED ORDER — SODIUM CHLORIDE 0.9 % IV SOLN: INTRAVENOUS | Status: DC

## 2022-02-25 MED ORDER — LABETALOL HCL 5 MG/ML IV SOLN: 10.0000 mg | INTRAVENOUS | Status: DC | PRN

## 2022-02-25 MED ORDER — ONDANSETRON HCL 4 MG/2ML IJ SOLN: 4.0000 mg | Freq: Four times a day (QID) | INTRAMUSCULAR | Status: DC | PRN

## 2022-02-25 MED ORDER — SODIUM CHLORIDE 0.9% FLUSH: 3.0000 mL | INTRAVENOUS | Status: DC | PRN

## 2022-02-25 MED ORDER — HEPARIN (PORCINE) IN NACL 2-0.9 UNITS/ML
INTRAMUSCULAR | Status: DC | PRN
  Administered 2022-02-25: 10 mL via INTRA_ARTERIAL

## 2022-02-25 MED ORDER — LIDOCAINE HCL (PF) 1 % IJ SOLN
INTRAMUSCULAR | Status: AC
  Filled 2022-02-25: qty 30

## 2022-02-25 MED ORDER — HEPARIN (PORCINE) IN NACL 1000-0.9 UT/500ML-% IV SOLN
INTRAVENOUS | Status: DC | PRN
  Administered 2022-02-25 (×2): 500 mL

## 2022-02-25 MED ORDER — FENTANYL CITRATE (PF) 100 MCG/2ML IJ SOLN
INTRAMUSCULAR | Status: DC | PRN
  Administered 2022-02-25: 25 ug via INTRAVENOUS

## 2022-02-25 MED ORDER — VERAPAMIL HCL 2.5 MG/ML IV SOLN
INTRAVENOUS | Status: AC
  Filled 2022-02-25: qty 2

## 2022-02-25 MED ORDER — LIDOCAINE HCL (PF) 1 % IJ SOLN
INTRAMUSCULAR | Status: DC | PRN
  Administered 2022-02-25 (×2): 2 mL

## 2022-02-25 SURGICAL SUPPLY — 14 items
BAND CMPR LRG ZPHR (HEMOSTASIS) ×1
BAND ZEPHYR COMPRESS 30 LONG (HEMOSTASIS) ×1 IMPLANT
CATH BALLN WEDGE 5F 110CM (CATHETERS) ×1 IMPLANT
CATH INFINITI 5 FR JL3.5 (CATHETERS) ×1 IMPLANT
CATH INFINITI 5FR MULTPACK ANG (CATHETERS) ×1 IMPLANT
ELECT DEFIB PAD ADLT CADENCE (PAD) ×1 IMPLANT
GLIDESHEATH SLEND SS 6F .021 (SHEATH) ×1 IMPLANT
GUIDEWIRE INQWIRE 1.5J.035X260 (WIRE) IMPLANT
INQWIRE 1.5J .035X260CM (WIRE) ×2
KIT HEART LEFT (KITS) ×2 IMPLANT
PACK CARDIAC CATHETERIZATION (CUSTOM PROCEDURE TRAY) ×2 IMPLANT
SHEATH GLIDE SLENDER 4/5FR (SHEATH) ×1 IMPLANT
TRANSDUCER W/STOPCOCK (MISCELLANEOUS) ×2 IMPLANT
TUBING CIL FLEX 10 FLL-RA (TUBING) ×2 IMPLANT

## 2022-02-25 NOTE — Interval H&P Note (Signed)
History and Physical Interval Note: ? ?02/25/2022 ?7:34 AM ? ?Maria [name]  has presented today for surgery, with the diagnosis of heart failure.  The various methods of treatment have been discussed with the patient and family. After consideration of risks, benefits and other options for treatment, the patient has consented to  Procedure(s): ?RIGHT/LEFT HEART CATH AND CORONARY ANGIOGRAPHY (N/A) as a surgical intervention.  The patient's history has been reviewed, patient examined, no change in status, stable for surgery.  I have reviewed the patient's chart and labs.  Questions were answered to the patient's satisfaction.   ? ?Cath Lab Visit (complete for each Cath Lab visit) ? ?Clinical Evaluation Leading to the Procedure:  ? ?ACS: No. ? ?Non-ACS:   ? ?Anginal Classification: CCS II ? ?Anti-ischemic medical therapy: Minimal Therapy (1 class of medications) ? ?Non-Invasive Test Results: No non-invasive testing performed ? ?Prior CABG: No previous CABG ? ? ? ? ? ? ? ?Verne Carrow ? ? ?

## 2022-02-25 NOTE — Telephone Encounter (Signed)
Checked on patient- she is recovering well after cath.  ?

## 2022-02-25 NOTE — Discharge Instructions (Signed)
Hold metformin 48 hours post cath 

## 2022-02-26 ENCOUNTER — Other Ambulatory Visit: Payer: Self-pay | Admitting: Family Medicine

## 2022-03-02 NOTE — Progress Notes (Signed)
? ?Phone [phone] ?In person visit ?  ?Subjective:  ? ?Maria [name] is a 81 y.o. year old very pleasant female patient who presents for/with See problem oriented charting ?Chief Complaint  ?Patient presents with  ? Follow-up  ? Hypertension  ? Hyperlipidemia  ? Gastroesophageal Reflux  ? Cough  ?  Pt would like something to help with cough  ? ? ?This visit occurred during the SARS-CoV-2 public health emergency.  Safety protocols were in place, including screening questions prior to the visit, additional usage of staff PPE, and extensive cleaning of exam room while observing appropriate contact time as indicated for disinfecting solutions.  ? ?Past Medical History-  ?Patient Active Problem List  ? Diagnosis Date Noted  ? Chronic systolic CHF (congestive heart failure), NYHA class 3 (HCC)   ?  Priority: High  ? NICM (nonischemic cardiomyopathy) (HCC)   ?  Priority: High  ? History of gastrointestinal hemorrhage   ?  Priority: High  ? Fatty liver 01/07/2022  ?  Priority: Medium   ? Microalbuminuria 12/17/2020  ?  Priority: Medium   ? Aortic atherosclerosis (HCC) 06/18/2020  ?  Priority: Medium   ? Hyperglycemia 04/16/2020  ?  Priority: Medium   ? Chronic kidney disease (CKD), stage III (moderate) (HCC) 10/04/2019  ?  Priority: Medium   ? GERD (gastroesophageal reflux disease) 05/27/2014  ?  Priority: Medium   ? Hyperlipidemia LDL goal <100 08/21/2008  ?  Priority: Medium   ? Essential hypertension 08/21/2008  ?  Priority: Medium   ? Arthritis of right hip 10/10/2019  ?  Priority: Low  ? Bursitis of right hip 10/10/2019  ?  Priority: Low  ? Allergic rhinitis 10/03/2019  ?  Priority: Low  ? 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/08/2016  ?  Priority: Low  ? Osteoarthritis of right knee 06/03/2012  ?  Priority: Low  ? PVC (premature ventricular contraction) 02/14/2022  ? ? ?Medications- reviewed and updated ?Current Outpatient Medications  ?Medication Sig Dispense Refill  ? albuterol (VENTOLIN HFA) 108 (90 Base) MCG/ACT  inhaler Inhale 2 puffs into the lungs every 6 (six) hours as needed for wheezing or shortness of breath. 8 g 0  ? aspirin EC 81 MG tablet Take 1 tablet (81 mg total) by mouth daily. Swallow whole. 90 tablet 3  ? atorvastatin (LIPITOR) 40 MG tablet TAKE 1 TABLET(40 MG) BY MOUTH DAILY 90 tablet 1  ? benzonatate (TESSALON) 100 MG capsule Take 1 capsule (100 mg total) by mouth 2 (two) times daily as needed for cough. 20 capsule 1  ? Calcium Carbonate-Vitamin D (CALCIUM PLUS VITAMIN D PO) Take 2 tablets by mouth daily.    ? cyanocobalamin 100 MCG tablet Take 100 mcg by mouth daily.    ? furosemide (LASIX) 20 MG tablet Take 1 tablet (20 mg total) by mouth daily. 90 tablet 3  ? metFORMIN (GLUCOPHAGE) 500 MG tablet TAKE 1 TABLET(500 MG) BY MOUTH DAILY WITH BREAKFAST 30 tablet 5  ? metoprolol succinate (TOPROL-XL) 25 MG 24 hr tablet TAKE 1 TABLET(25 MG) BY MOUTH DAILY 30 tablet 5  ? Multiple Vitamin (MULTI-DAY PO) Take 2 tablets by mouth daily. womens multivitiamin gummies 2 a day    ? omeprazole (PRILOSEC) 20 MG capsule TAKE 1 CAPSULE(20 MG) BY MOUTH DAILY 90 capsule 1  ? potassium chloride SA (KLOR-CON M) 20 MEQ tablet Take 1 tablet (20 mEq total) by mouth daily. 90 tablet 3  ? sacubitril-valsartan (ENTRESTO) 49-51 MG Take 1 tablet by mouth  2 (two) times daily. 180 tablet 3  ? ?No current facility-administered medications for this visit.  ? ?  ?Objective:  ?BP 106/60   Pulse (!) 58   Temp 98 ?F (36.7 ?C)   Ht 5\' 1"  (1.549 m)   Wt 147 lb 6.4 oz (66.9 kg)   SpO2 98%   BMI 27.85 kg/m?  ?Gen: NAD, resting comfortably ?CV: RRR no murmurs rubs or gallops ?Lungs: CTAB no crackles, wheeze, rhonchi ?Abdomen: slight firmness across abdomen- likely fluid related ?Ext: no edema ?Skin: warm, dry ?  ? ?Assessment and Plan  ? ?#CHF- nonischemic with no CAD on cath 02/25/22 ?#Essential hypertension/CKD stage III with elevated microalbumin to creatinine ratio ?S: Compliant withlasix 20 mg daily- listed as 40 mg in last cardiology note  but she states was titrated back down to 20 mg and rx lines up with this, metoprolol 25 mg XR, entresto 49-51 mg ?-off amlodipine due to CHF, also  Prior was on atenolol-chlorthalidone 50-25 mg but had worsening of renal function and issues maintaining potassium. ? ?GFR ranging from high 30s to 50s since 2019- 30s in 2023 mostly ?- Patient had elevated microalbumin to creatinine ratio over 500 ratio November 11, 2020-we started valsartan 80 mg daily- later changed to entresto ? ?CHF stable- no increased SOB or edema- weight down 2 lbs from last visit ? ?BP Readings from Last 3 Encounters:  ?03/29/22 106/60  ?03/03/22 129/83  ?02/25/22 (!) 136/123  ?A/P:  HTN reasonably controlled- continue current medicine as long as not lightheaded/dizzy ? ?CKD III stable just about 10 days ago  with Cr 1.39- continue current meds  ? ?CHF appears stable- continue current meds ?- thought to be nonischemic- possibly viral mediated ?- mild cough still- will send in some tessalon- much improved overall ? ?Has PVCs- just had zio patch which we removed today to be mailed after 2 weeks. Continue metoprolol ? ?With aortic atherosclerosis- cardiology has recommended aspirin even with GI bleed history- I am going to reach out and see if they would be ok stopping aspirin with reassuring cath- just sent at this time ? ?#Hyperlipidemia ?S: Compliant with atorvastatin 40 mg. LDL goal at least under 100. ?Lab Results  ?Component Value Date  ? CHOL 139 12/17/2021  ? HDL 43.00 12/17/2021  ? [place] 86 12/17/2021  ? LDLDIRECT 100.0 07/23/2018  ? TRIG 52.0 12/17/2021  ? CHOLHDL 3 12/17/2021  ? A/P:  reasonable control just in January especially with cath showing no CAD- continue current meds ? ?#GERD ?S: Compliant with omeprazole 20 mg. B12 last checked June 2022 ?-history of severe GI bleed years ago so keeping her on PPI despite CKD III ?B12 levels related to PPI use: ?Lab Results  ?Component Value Date  ? [id number] 06/04/2021  ?A/P:   Controlled. Continue current medications.  ? ?# Hyperglycemia/insulin resistance/prediabetes ?S: Medication: Metformin 500 mg with breakfast started when A1c reached 6.1 and patient also had elevated microalbumin to creatinine ratio ?Lab Results  ?Component Value Date  ? HGBA1C 6.2 12/17/2021  ? HGBA1C 6.2 06/04/2021  ? HGBA1C 6.1 (H) 11/11/2020  ? A/P: Controlled. Continue current medications. Recheck in July or later- a1c. If GFR worsens would have to change to  ? ?Recommended follow up: Return for next already scheduled visit or sooner if needed. ?Future Appointments  ?Date Time Provider [place]  ?03/31/2022 11:00 AM [facility] PHARMACIST [facility]  ?06/17/2022 11:00 AM Marin Olp, MD [facility]  ?06/24/2022  8:00 AM Garret Reddish  Jenetta Downer, MD [facility]  ?08/23/2022 10:20 AM Werner Lean, MD [facility]  ?09/02/2022  8:00 AM [facility] HEALTH COACH [facility]  ?12/19/2022  9:40 AM Yong Channel, Brayton Mars, MD [facility]  ? ? ?Lab/Order associations: ?  ICD-10-CM   ?1. Essential hypertension  I10   ?  ?2. Hyperglycemia  R73.9   ?  ?3. Gastroesophageal reflux disease without esophagitis  K21.9   ?  ?4. Hyperlipidemia LDL goal <100  E78.5   ?  ?5. Stage 3 chronic kidney disease, unspecified whether stage 3a or 3b CKD ([place])  N18.30   ?  ?6. NICM (nonischemic cardiomyopathy) ([place])  I42.8   ?  ?7. Chronic systolic CHF (congestive heart failure), NYHA class 3 (HCC)  I50.22   ?  ?8. History of gastrointestinal hemorrhage  Z87.19   ?  ? ? ?Meds ordered this encounter  ?Medications  ? benzonatate (TESSALON) 100 MG capsule  ?  Sig: Take 1 capsule (100 mg total) by mouth 2 (two) times daily as needed for cough.  ?  Dispense:  20 capsule  ?  Refill:  1  ? ? ?Return precautions advised.  ?Garret Reddish, MD ? ? ?

## 2022-03-03 ENCOUNTER — Ambulatory Visit: Payer: Medicare Other | Admitting: Internal Medicine

## 2022-03-03 ENCOUNTER — Encounter: Payer: Self-pay | Admitting: Internal Medicine

## 2022-03-03 VITALS — BP 129/83 | HR 68 | Ht 61.0 in | Wt 144.0 lb

## 2022-03-03 DIAGNOSIS — I428 Other cardiomyopathies: Secondary | ICD-10-CM

## 2022-03-03 DIAGNOSIS — I5020 Unspecified systolic (congestive) heart failure: Secondary | ICD-10-CM | POA: Diagnosis not present

## 2022-03-03 DIAGNOSIS — I493 Ventricular premature depolarization: Secondary | ICD-10-CM

## 2022-03-03 DIAGNOSIS — I700 Atherosclerosis of aorta: Secondary | ICD-10-CM

## 2022-03-03 MED ORDER — ENTRESTO 49-51 MG PO TABS
1.0000 | ORAL_TABLET | Freq: Two times a day (BID) | ORAL | 3 refills | Status: DC
Start: 2022-03-03 — End: 2022-08-26

## 2022-03-03 NOTE — Progress Notes (Signed)
?Cardiology Office Note:   ? ?Date:  03/03/2022  ? ?ID:  [name], DOB 1941-01-01, MRN [id number] ? ?PCP:  Marin Olp, MD ?  ?[facility] Providers ?Cardiologist:  Werner Lean, MD    ? ?Referring MD: Marin Olp, MD  ? ?CC: Heart failure follow up ? ?History of Present Illness:   ? ?Maria [name] is a 81 y.o. female with a hx of HTN, aortic atherosclerosis and HLD, CKD stage IIIa. ?Found to have LVEF 25%.   Started on [place], no CAD on 02/25/22 cath. RA 12, PCWP 19. ? ?Patient notes that she is doing much better.   ?Has improved on Entresto- no LE swelling ?She recounts her LHC/RHC. ? ?No chest pain or pressure .  No SOB/DOE and no PND/Orthopnea.  No weight gain or leg swelling.  No palpitations or syncope.  Still has the cough. ? ? ?Past Medical History:  ?Diagnosis Date  ? Arthritis   ? Bronchitis   ? hx of  ? GERD (gastroesophageal reflux disease)   ? History of bleeding ulcers   ? History of gastrointestinal hemorrhage   ? now off aspirin  ? Hyperlipidemia   ? Hypertension   ? Osteoarthritis of right knee 06/03/2012  ? ? ?Past Surgical History:  ?Procedure Laterality Date  ? ABDOMINAL HYSTERECTOMY    ? nonmalignant reasons  ? APPENDECTOMY    ? RIGHT/LEFT HEART CATH AND CORONARY ANGIOGRAPHY N/A 02/25/2022  ? Procedure: RIGHT/LEFT HEART CATH AND CORONARY ANGIOGRAPHY;  Surgeon: Burnell Blanks, MD;  Location: [facility];  Service: Cardiovascular;  Laterality: N/A;  ? TOTAL KNEE ARTHROPLASTY  05/30/2012  ? Procedure: TOTAL KNEE ARTHROPLASTY;  Surgeon: Ninetta Lights, MD;  Location: [facility];  Service: Orthopedics;  Laterality: Right;  DR MURPHY WANTS 90 MINUTES FOR THIS CASE. OSTEONICS  ? 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 Procedure: LEFT TOTAL KNEE ARTHROPLASTY;  Surgeon: Ninetta Lights, MD;  Location: [facility];  Service: Orthopedics;  Laterality: Left;  ? ? ?Current Medications: ?Current Meds  ?Medication Sig  ? albuterol (VENTOLIN HFA) 108 (90 Base) MCG/ACT  inhaler Inhale 2 puffs into the lungs every 6 (six) hours as needed for wheezing or shortness of breath.  ? aspirin EC 81 MG tablet Take 1 tablet (81 mg total) by mouth daily. Swallow whole.  ? atorvastatin (LIPITOR) 40 MG tablet TAKE 1 TABLET(40 MG) BY MOUTH DAILY  ? Calcium Carbonate-Vitamin D (CALCIUM PLUS VITAMIN D PO) Take 2 tablets by mouth daily.  ? cyanocobalamin 100 MCG tablet Take 100 mcg by mouth daily.  ? furosemide (LASIX) 40 MG tablet Take 1 tablet (40 mg total) by mouth twice daily for 3 days only, then decrease to taking 1 tablet (40 mg total) by mouth daily thereafter. (Patient taking differently: Take 40 mg by mouth daily.)  ? metFORMIN (GLUCOPHAGE) 500 MG tablet TAKE 1 TABLET(500 MG) BY MOUTH DAILY WITH BREAKFAST  ? metoprolol succinate (TOPROL-XL) 25 MG 24 hr tablet TAKE 1 TABLET(25 MG) BY MOUTH DAILY  ? Multiple Vitamin (MULTI-DAY PO) Take 2 tablets by mouth daily. womens multivitiamin gummies 2 a day  ? omeprazole (PRILOSEC) 20 MG capsule TAKE 1 CAPSULE(20 MG) BY MOUTH DAILY  ? potassium chloride SA (KLOR-CON M) 20 MEQ tablet Take 1 tablet (20 mEq total) by mouth twice daily for 3 days only, then decrease to taking 1 tablet (20 mEq total) by mouth daily thereafter. (Patient taking differently: 20 mEq daily.)  ? sacubitril-valsartan (ENTRESTO)  49-51 MG Take 1 tablet by mouth 2 (two) times daily.  ? [DISCONTINUED] sacubitril-valsartan (ENTRESTO) 24-26 MG Take 1 tablet by mouth 2 (two) times daily.  ?  ? ?Allergies:   Coumadin [warfarin sodium], Methocarbamol, and Norco [hydrocodone-acetaminophen]  ? ?Social History  ? ?Socioeconomic History  ? Marital status: Divorced  ?  Spouse name: Not on file  ? Number of children: Not on file  ? Years of education: Not on file  ? Highest education level: Not on file  ?Occupational History  ? Occupation: day care   ?Tobacco Use  ? Smoking status: Never  ? Smokeless tobacco: Never  ?Substance and Sexual Activity  ? Alcohol use: No  ? Drug use: No  ? Sexual  activity: Not on file  ?Other Topics Concern  ? Not on file  ?Social History Narrative  ? Lives alone. Great granddaughter stays with her on weekends.   ? Divorced 75.   ?   ? Still doing childcare  ? Retired from [employer]  ? ?Social Determinants of Health  ? ?Financial Resource Strain: Low Risk   ? Difficulty of Paying Living Expenses: Not hard at all  ?Food Insecurity: No Food Insecurity  ? Worried About Charity fundraiser in the Last Year: Never true  ? Ran Out of Food in the Last Year: Never true  ?Transportation Needs: No Transportation Needs  ? Lack of Transportation (Medical): No  ? Lack of Transportation (Non-Medical): No  ?Physical Activity: Inactive  ? Days of Exercise per Week: 0 days  ? Minutes of Exercise per Session: 0 min  ?Stress: No Stress Concern Present  ? Feeling of Stress : Not at all  ?Social Connections: Moderately Isolated  ? Frequency of Communication with Friends and Family: More than three times a week  ? Frequency of Social Gatherings with Friends and Family: More than three times a week  ? Attends Religious Services: More than 4 times per year  ? Active Member of Clubs or Organizations: No  ? Attends Archivist Meetings: Never  ? Marital Status: Divorced  ?  ?Social: Very Religious ? ?Family History: ?The patient's family history includes Congestive Heart Failure in her mother; Other in her father. ? ?ROS:   ?Please see the history of present illness.    ? All other systems reviewed and are negative. ? ?EKGs/Labs/Other Studies Reviewed:   ? ?The following studies were reviewed today: ? ?EKG:  EKG is  ordered today.  The ekg ordered today demonstrates  ?02/14/22: Sinus tachycardia with frequent PVCs ?Recent Labs: ?01/07/2022: Brain Natriuretic Peptide CANCELED ?02/08/2022: ALT 46; TSH 0.91 ?02/22/2022: BUN 14; Creatinine, Ser 1.49; NT-Pro BNP 16,027; Platelets 247 ?02/25/2022: Hemoglobin 11.6; Potassium 3.7; Sodium 141  ?Recent Lipid Panel ?   ?Component Value Date/Time  ?  CHOL 139 12/17/2021 1033  ? TRIG 52.0 12/17/2021 1033  ? HDL 43.00 12/17/2021 1033  ? CHOLHDL 3 12/17/2021 1033  ? VLDL 10.4 12/17/2021 1033  ? [place] 86 12/17/2021 1033  ? [place] 89 11/11/2020 0939  ? LDLDIRECT 100.0 07/23/2018 1108  ? ?    ? ?Physical Exam:   ? ?VS:  BP 129/83   Pulse 68   Ht 5\' 1"  (1.549 m)   Wt 144 lb (65.3 kg)   SpO2 97%   BMI 27.21 kg/m?    ? ?Wt Readings from Last 3 Encounters:  ?03/03/22 144 lb (65.3 kg)  ?02/25/22 154 lb (69.9 kg)  ?02/18/22 150 lb 12.8 oz (68.4 kg)  ?  ?  Gen: No distress   ?Neck: JVD to the clavicle ?Cardiac: No Rubs or Gallops, regular rhythm with holosystolic murmur ?Respiratory: Clear to auscultation bilaterally, normal effort, normal  respiratory rate ?GI: Soft, nontender, non-distended  ?MS: trace non pitting edema;  moves all extremities ?Integument: Skin feels warm ?Neuro:  At time of evaluation, alert and oriented to person/place/time/situation  ?Psych: Normal affect, patient feels better ? ? ?ASSESSMENT:   ? ?1. HFrEF (heart failure with reduced ejection fraction) ([place])   ?2. PVC (premature ventricular contraction)   ?3. NICM (nonischemic cardiomyopathy) ([place])   ?4. Aortic atherosclerosis ([place])   ? ?PLAN:   ? ?HFrEF PVCs ?HTN ?Aortic Athosclerosis and HLD ?CKD Stage IIIa ?- pending results of [place]; may increase her toprolol XL based on results and HF ?- continue lasix 40 mg Po daily ?- increase Entresto 47/51 and BMP in one week ?- she has $30 copays that are impeding her care significantly; financial issues are with Entresto make SGLT2i a difficult addition ?- will potentially add MRA based on the BMP results ?- sending to Pharm D as she will need support in her GDMT uptitration ? - ASA 81 continued ?- continue atorvastatin 40 mg  ? ?Three months with me (keep prior appointment) ? ?She has no further sx and feels well; has never had syncope. ?She will resume driving; if I saw VT on monitor we would change this but none was seen during cath eval  (tele) ?She will try to go back to work after labs results. ? ?   ? ? ?Medication Adjustments/Labs and Tests Ordered: ?Current medicines are reviewed at length with the patient today.  Concerns regarding medicines are outli

## 2022-03-03 NOTE — Patient Instructions (Signed)
Medication Instructions:  ?Your physician has recommended you make the following change in your medication:  ?INCREASE: sacubitril-valsartan (Entresto) to 49/51 mg by mouth twice daily ? ?*If you need a refill on your cardiac medications before your next appointment, please call your pharmacy* ? ? ?Lab Work: ?IN 2 WEEKS: BMP ? ?If you have labs (blood work) drawn today and your tests are completely normal, you will receive your results only by: ?MyChart Message (if you have MyChart) OR ?A paper copy in the mail ?If you have any lab test that is abnormal or we need to change your treatment, we will call you to review the results. ? ? ?Testing/Procedures: ?Your physician has referred you to see our Pharmacy Clinic for Heart Failure.  ? ? ?Follow-Up: ?At [facility], you and your health needs are our priority.  As part of our continuing mission to provide you with exceptional heart care, we have created designated Provider Care Teams.  These Care Teams include your primary Cardiologist (physician) and Advanced Practice Providers (APPs -  Physician Assistants and Nurse Practitioners) who all work together to provide you with the care you need, when you need it. ? ?We recommend signing up for the patient portal called "MyChart".  Sign up information is provided on this After Visit Summary.  MyChart is used to connect with patients for Virtual Visits (Telemedicine).  Patients are able to view lab/test results, encounter notes, upcoming appointments, etc.  Non-urgent messages can be sent to your provider as well.   ?To learn more about what you can do with MyChart, go to [email or web address].   ? ?Your next appointment:   ?6 month(s) ? ?The format for your next appointment:   ?In Person ? ?Provider:   ?Werner Lean, MD   ? ?

## 2022-03-08 ENCOUNTER — Telehealth: Payer: Self-pay | Admitting: Internal Medicine

## 2022-03-08 NOTE — Telephone Encounter (Signed)
Patient is requesting a time to schedule to come by the office to have her monitor applied. Please assist as able. ?

## 2022-03-10 NOTE — Telephone Encounter (Signed)
Scheduled appointment for patient to come to [facility] Tuesday, 03/15/22 10:30 AM. ?

## 2022-03-15 DIAGNOSIS — I493 Ventricular premature depolarization: Secondary | ICD-10-CM | POA: Diagnosis not present

## 2022-03-15 DIAGNOSIS — I10 Essential (primary) hypertension: Secondary | ICD-10-CM

## 2022-03-18 ENCOUNTER — Other Ambulatory Visit: Payer: Medicare Other | Admitting: *Deleted

## 2022-03-18 DIAGNOSIS — I5020 Unspecified systolic (congestive) heart failure: Secondary | ICD-10-CM | POA: Diagnosis not present

## 2022-03-19 LAB — BASIC METABOLIC PANEL
BUN/Creatinine Ratio: 16 (ref 12–28)
BUN: 22 mg/dL (ref 8–27)
CO2: 24 mmol/L (ref 20–29)
Calcium: 9.9 mg/dL (ref 8.7–10.3)
Chloride: 105 mmol/L (ref 96–106)
Creatinine, Ser: 1.39 mg/dL — ABNORMAL HIGH (ref 0.57–1.00)
Glucose: 108 mg/dL — ABNORMAL HIGH (ref 70–99)
Potassium: 4.2 mmol/L (ref 3.5–5.2)
Sodium: 145 mmol/L — ABNORMAL HIGH (ref 134–144)
eGFR: 38 mL/min/{1.73_m2} — ABNORMAL LOW (ref >59)

## 2022-03-22 ENCOUNTER — Telehealth: Payer: Self-pay | Admitting: Internal Medicine

## 2022-03-22 NOTE — Telephone Encounter (Signed)
Pt would like for nurse to give her a call regarding monitor. Please advise ?

## 2022-03-22 NOTE — Telephone Encounter (Signed)
Mailbox full unable to leave a voicemail.

## 2022-03-23 ENCOUNTER — Other Ambulatory Visit: Payer: Self-pay

## 2022-03-23 DIAGNOSIS — E785 Hyperlipidemia, unspecified: Secondary | ICD-10-CM

## 2022-03-23 DIAGNOSIS — Z01812 Encounter for preprocedural laboratory examination: Secondary | ICD-10-CM

## 2022-03-23 DIAGNOSIS — I509 Heart failure, unspecified: Secondary | ICD-10-CM

## 2022-03-23 DIAGNOSIS — N183 Chronic kidney disease, stage 3 unspecified: Secondary | ICD-10-CM

## 2022-03-23 DIAGNOSIS — Z79899 Other long term (current) drug therapy: Secondary | ICD-10-CM

## 2022-03-23 MED ORDER — POTASSIUM CHLORIDE CRYS ER 20 MEQ PO TBCR: 20.0000 meq | EXTENDED_RELEASE_TABLET | Freq: Every day | ORAL | 3 refills | Status: DC

## 2022-03-23 MED ORDER — FUROSEMIDE 20 MG PO TABS: 20.0000 mg | ORAL_TABLET | Freq: Every day | ORAL | 3 refills | Status: DC

## 2022-03-23 NOTE — Telephone Encounter (Signed)
-----   Message from Christell Constant, MD sent at 03/20/2022  2:53 PM EDT ----- ?Results: ?Kidney has improved on higher Entresto ?Increase in NA ?Plan: ?Reduce lasix to 20 mg PO Daily, she has f/u in Pharm D clinic. ? ?Christell Constant, MD ? ?

## 2022-03-23 NOTE — Telephone Encounter (Signed)
The patient has been notified of the result and verbalized understanding.  All questions (if any) were answered. ?Precious Gilding, RN 03/23/2022 9:24 AM   ?Pt reports would like to set up an appointment to have heart monitor removed on 03/29/22.  Advised pt it's simple to remove and place on back page of monitor booklet.  Pt does not feel comfortable taking off monitor.  Has an appointment with PCP on this day.  Advised pt to have PCP take off monitor at [place].  Pt will have PCP take off monitor and will mail it back.   ?

## 2022-03-29 ENCOUNTER — Ambulatory Visit (INDEPENDENT_AMBULATORY_CARE_PROVIDER_SITE_OTHER): Payer: Medicare Other | Admitting: Family Medicine

## 2022-03-29 ENCOUNTER — Encounter: Payer: Self-pay | Admitting: Family Medicine

## 2022-03-29 VITALS — BP 106/60 | HR 58 | Temp 98.0°F | Ht 61.0 in | Wt 147.4 lb

## 2022-03-29 DIAGNOSIS — I5022 Chronic systolic (congestive) heart failure: Secondary | ICD-10-CM | POA: Diagnosis not present

## 2022-03-29 DIAGNOSIS — K219 Gastro-esophageal reflux disease without esophagitis: Secondary | ICD-10-CM | POA: Diagnosis not present

## 2022-03-29 DIAGNOSIS — I10 Essential (primary) hypertension: Secondary | ICD-10-CM | POA: Diagnosis not present

## 2022-03-29 DIAGNOSIS — N183 Chronic kidney disease, stage 3 unspecified: Secondary | ICD-10-CM | POA: Diagnosis not present

## 2022-03-29 DIAGNOSIS — Z8719 Personal history of other diseases of the digestive system: Secondary | ICD-10-CM | POA: Diagnosis not present

## 2022-03-29 DIAGNOSIS — I428 Other cardiomyopathies: Secondary | ICD-10-CM

## 2022-03-29 DIAGNOSIS — E785 Hyperlipidemia, unspecified: Secondary | ICD-10-CM | POA: Diagnosis not present

## 2022-03-29 DIAGNOSIS — R739 Hyperglycemia, unspecified: Secondary | ICD-10-CM

## 2022-03-29 MED ORDER — BENZONATATE 100 MG PO CAPS: 100.0000 mg | ORAL_CAPSULE | Freq: Two times a day (BID) | ORAL | 1 refills | Status: DC | PRN

## 2022-03-29 NOTE — Patient Instructions (Addendum)
No labs today! Your son's prayers worked!  ? ?Glad you are doing better- sent in cough medicine but if cough worsens please see me back prior to next visit- if still coughing may order another x-ray at later date ? ?Recommended follow up: Return for next already scheduled visit or sooner if needed. ?

## 2022-03-30 ENCOUNTER — Telehealth: Payer: Self-pay

## 2022-03-30 NOTE — Telephone Encounter (Signed)
Called and spoke with pt and pt aware of message. ?

## 2022-03-30 NOTE — Telephone Encounter (Signed)
-----   Message from Marin Olp, MD sent at 03/29/2022  5:41 PM EDT ----- ?Thanks!  ? ?Team can you reach out to Ms. Talaga and tell her to stop aspirin and remove from card list. Tell her me and DR. Chandrasekhar consulted on this.  ? ?Garret Reddish ?----- Message ----- ?From: Werner Lean, MD ?Sent: 03/29/2022   9:59 AM EDT ?To: Marin Olp, MD ? ?Let's try it.  She will be on a fair amount of medication by the end of her care given her HF, but I'm not opposed to it. ? ?Thanks, ?MAC ? ?----- Message ----- ?From: Marin Olp, MD ?Sent: 03/29/2022   9:37 AM EDT ?To: Werner Lean, MD ? ?Dr. Gasper Sells,  ? ?I know Ms. Sampsel has aortic atherosclerosis but with reassuring cath I was wondering if she would be ok without aspirin - she has a history of severe GI bleed (granted she is on long term omeprazole now so risk is lower).  ? ?Thanks for your consideration/thoughts,  ?Garret Reddish ? ? ? ?

## 2022-03-30 NOTE — Progress Notes (Signed)
Patient ID: Maria [name]                 DOB: 1941-02-17                      MRN: [id number] ? ? ? ? ?HPI: ?Maria [name] is a 81 y.o. female referred by Dr. Gasper Sells to pharmacy clinic for HF medication management. PMH is significant for HTN, aortic atherosclerosis, HLD, CKD stage IIIa, and NICM. Most recent LVEF 20 to 25% on 02/17/22 with subsequent switch from valsartan to Entresto. Pt had cardiac cath on 02/25/22 that showed no evidence of CAD r/o ischemic causes. Pt wore Zio Patch for 2 weeks with results pending. At f/u with Dr. Gasper Sells, she was doing much better and he increased her Entresto to 49-51mg  BID and referred her to PharmD for further GDMT optimization. Labs on 03/18/22 were stable and Lasix dose was decreased from 40mg  to 20mg  daily. ? ?Today she presents to pharmacy clinic for further medication titration. [name] is expensive for pt, recently paid $141 for a 3 month supply. Denies dizziness, lightheadedness, and fatigue. Denies chest pain or palpitations. She woke up this morning feeling SOB which is unusual for her. She mentioned telling Dr. Yong Channel at her appt on 03/29/22 her abdomen felt tight and he attributed it to fluid. Did have swelling in her legs/ankles this past Sunday, but pt was on her feet all day and it resolved after elevating her feet. Has had a consistent cough since diagnosis for HF that subsided slightly when she was on Lasix 40mg  dose. She does cough up clear phlegm. She was prescribed benzonatate by Dr. Yong Channel for it. ? ?She does check her weight at home (normal range 142-143). She said her weight at Dr. Ansel Bong office was higher at 147 lbs. She has not noticed fluctuations in her home weight though since taking the lower Lasix dose.  Appetite has been good. She does adheres to a low-salt diet and checks labels at the grocery store. ? ?Home BP readings: 106/80, another with SBP 112 ? ?Clinic readings: 118/68, 124/60 on recheck, HR fluctuating between  95-102 ? ?Current CHF meds: Entresto 49-51mg  BID, Toprol XL 25mg  daily, furosemide 20mg  daily ? ?BP goal: <130/80 mmHg ? ?Family History: CHF (mother) ? ?Social History: Denies tobacco, illicit drug, and alcohol use ? ?Diet: Does not use salt at all. Does check labels at grocery store for salt content. Tries not to eat frozen meals ? ?Wt Readings from Last 3 Encounters:  ?03/29/22 147 lb 6.4 oz (66.9 kg)  ?03/03/22 144 lb (65.3 kg)  ?02/25/22 154 lb (69.9 kg)  ? ?BP Readings from Last 3 Encounters:  ?03/29/22 106/60  ?03/03/22 129/83  ?02/25/22 (!) 136/123  ? ?Pulse Readings from Last 3 Encounters:  ?03/29/22 (!) 58  ?03/03/22 68  ?02/25/22 (!) 0  ? ? ?Renal function: ?Estimated Creatinine Clearance: 27.3 mL/min (A) (by C-G formula based on SCr of 1.39 mg/dL (H)). ? ?Past Medical History:  ?Diagnosis Date  ? Arthritis   ? Bronchitis   ? hx of  ? GERD (gastroesophageal reflux disease)   ? History of bleeding ulcers   ? History of gastrointestinal hemorrhage   ? now off aspirin  ? Hyperlipidemia   ? Hypertension   ? Osteoarthritis of right knee 06/03/2012  ? ? ?Current Outpatient Medications on File Prior to Visit  ?Medication Sig Dispense Refill  ? albuterol (VENTOLIN HFA) 108 (90 Base) MCG/ACT inhaler Inhale 2 puffs  into the lungs every 6 (six) hours as needed for wheezing or shortness of breath. 8 g 0  ? aspirin EC 81 MG tablet Take 1 tablet (81 mg total) by mouth daily. Swallow whole. 90 tablet 3  ? atorvastatin (LIPITOR) 40 MG tablet TAKE 1 TABLET(40 MG) BY MOUTH DAILY 90 tablet 1  ? benzonatate (TESSALON) 100 MG capsule Take 1 capsule (100 mg total) by mouth 2 (two) times daily as needed for cough. 20 capsule 1  ? Calcium Carbonate-Vitamin D (CALCIUM PLUS VITAMIN D PO) Take 2 tablets by mouth daily.    ? cyanocobalamin 100 MCG tablet Take 100 mcg by mouth daily.    ? furosemide (LASIX) 20 MG tablet Take 1 tablet (20 mg total) by mouth daily. 90 tablet 3  ? metFORMIN (GLUCOPHAGE) 500 MG tablet TAKE 1 TABLET(500  MG) BY MOUTH DAILY WITH BREAKFAST 30 tablet 5  ? metoprolol succinate (TOPROL-XL) 25 MG 24 hr tablet TAKE 1 TABLET(25 MG) BY MOUTH DAILY 30 tablet 5  ? Multiple Vitamin (MULTI-DAY PO) Take 2 tablets by mouth daily. womens multivitiamin gummies 2 a day    ? omeprazole (PRILOSEC) 20 MG capsule TAKE 1 CAPSULE(20 MG) BY MOUTH DAILY 90 capsule 1  ? potassium chloride SA (KLOR-CON M) 20 MEQ tablet Take 1 tablet (20 mEq total) by mouth daily. 90 tablet 3  ? sacubitril-valsartan (ENTRESTO) 49-51 MG Take 1 tablet by mouth 2 (two) times daily. 180 tablet 3  ? ?No current facility-administered medications on file prior to visit.  ? ? ?Allergies  ?Allergen Reactions  ? Coumadin [Warfarin Sodium]   ?  Ulcers  ? Methocarbamol   ?  Hallucinations  ? Norco [Hydrocodone-Acetaminophen]   ?  Hallucinations  ? ?Assessment/Plan: ? ?1. 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 Will start spironolactone 12.5mg  daily, decrease KCl from 14meq to 9meq daily, and recheck BMET in 1 week. Continue Entresto 49-51mg  BID, Toprol 25mg  daily, and Lasix 20mg  daily (weight up a few lbs and pt with some SOB this AM so will not de-escalate loop yet, hopefully can in the future as ARNI and MRA doses are increased further). HR high today but has been in the 50s-60s on last 2 visits. ? ?2. Copay assistance - Entresto copay is difficult for pt to continue paying. Will fill out pt assistance for [facility] as well as Wilder Glade as she ultimately needs SGLT2i added on as well. SGLT2i would also benefit her CKD and provide CV benefit. She is aware to drop off tax return statement from last year that's required for [facility] pt assistance.  ? ?Will call pt after labs next week with f/u visit pending pt assistance approvals. Pt to continue monitoring BP, HR, and weight at home in the mean time. ? ?Patient seen with Debria Garret, [place] pharmacy student ? ?Megan E. Supple, PharmD, BCACP, CPP ?[place] [id number] [place], [place] [address] ?Phone: [phone]; Fax: [phone] ?03/31/2022 12:27 PM ? ? ?

## 2022-03-31 ENCOUNTER — Ambulatory Visit: Payer: Medicare Other | Admitting: Pharmacist

## 2022-03-31 ENCOUNTER — Telehealth: Payer: Self-pay | Admitting: Pharmacist

## 2022-03-31 VITALS — BP 124/60 | HR 95 | Wt 149.8 lb

## 2022-03-31 DIAGNOSIS — N183 Chronic kidney disease, stage 3 unspecified: Secondary | ICD-10-CM

## 2022-03-31 DIAGNOSIS — E785 Hyperlipidemia, unspecified: Secondary | ICD-10-CM

## 2022-03-31 DIAGNOSIS — I5022 Chronic systolic (congestive) heart failure: Secondary | ICD-10-CM

## 2022-03-31 DIAGNOSIS — I509 Heart failure, unspecified: Secondary | ICD-10-CM

## 2022-03-31 DIAGNOSIS — Z01812 Encounter for preprocedural laboratory examination: Secondary | ICD-10-CM | POA: Diagnosis not present

## 2022-03-31 DIAGNOSIS — Z79899 Other long term (current) drug therapy: Secondary | ICD-10-CM | POA: Diagnosis not present

## 2022-03-31 MED ORDER — SPIRONOLACTONE 25 MG PO TABS: 12.5000 mg | ORAL_TABLET | Freq: Every day | ORAL | 3 refills | Status: DC

## 2022-03-31 NOTE — Telephone Encounter (Signed)
Pt called clinic. 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 Pt aware MD will need to make this determination and that he is out of office until next week. She does not require a work note. ?

## 2022-03-31 NOTE — Patient Instructions (Addendum)
Your blood pressure goal is <130/80 ? ?Start spironolactone 12.5mg  once daily. You will take a half tablet of the 25mg  dose once daily. ? ?Decrease potassium chloride supplements to 10 mEq. You can take a half of a tablet of the you have now until you run out. ? ?Continue current doses of Lasix and metoprolol succinate (Toprol XL). ? ?Recheck labs on Thursday, May 4th any time after 7:30am ? ?Continue to monitor blood pressure, heart rate, and weight at home ? ?We will submit patient assistant forms for [facility] and another heart failure medication called [facility] ?

## 2022-04-01 ENCOUNTER — Telehealth: Payer: Self-pay | Admitting: Pharmacist

## 2022-04-01 NOTE — Telephone Encounter (Signed)
Received message that Maria [name] pt assistance requires tax return info for her son as well since he lives with her. He will email this to me. ?

## 2022-04-01 NOTE — Telephone Encounter (Signed)
Called pt advised of MD recommendation for returning to work. Pt verbalizes understanding no further questions or concerns.  ?

## 2022-04-04 NOTE — Telephone Encounter (Signed)
Marcelline Deist pt assistance approved through 12/04/22. Will call pt after 5/4 labs result (checking BMET after spironolactone start). If labs are stable then, pt will be advised to start on Farxiga - will also take 7-10 days for shipment to reach her. Would need follow up with me 3-4 weeks after for BP check and BMET. ?

## 2022-04-05 ENCOUNTER — Telehealth: Payer: Self-pay

## 2022-04-05 DIAGNOSIS — I10 Essential (primary) hypertension: Secondary | ICD-10-CM | POA: Diagnosis not present

## 2022-04-05 DIAGNOSIS — I493 Ventricular premature depolarization: Secondary | ICD-10-CM | POA: Diagnosis not present

## 2022-04-05 NOTE — Telephone Encounter (Signed)
Call received from [facility] advising HM results available. ?

## 2022-04-06 ENCOUNTER — Telehealth: Payer: Self-pay

## 2022-04-06 MED ORDER — METOPROLOL SUCCINATE ER 100 MG PO TB24: 100.0000 mg | ORAL_TABLET | Freq: Every day | ORAL | 3 refills | Status: DC

## 2022-04-06 NOTE — Telephone Encounter (Signed)
The patient has been notified of the result and verbalized understanding.  All questions (if any) were answered. ?Macie Burows, RN 04/06/2022 3:04 PM   ?Pt currently taking metoprolol succinate 25 mg PO QD.  Will take 4 tablets of current dose until gone and then start taking 1 tablet of metoprolol succinate 100 mg PO QD.  ?

## 2022-04-06 NOTE — Telephone Encounter (Signed)
-----   Message from Christell Constant, MD sent at 04/06/2022  1:15 PM EDT ----- ?Results: ?Frequent ectopy in the setting of HF ?Plan: ?Increase metoprolol dose to 100 mg. ? ?Christell Constant, MD ? ?

## 2022-04-07 ENCOUNTER — Other Ambulatory Visit: Payer: Medicare Other

## 2022-04-07 DIAGNOSIS — I5022 Chronic systolic (congestive) heart failure: Secondary | ICD-10-CM

## 2022-04-08 ENCOUNTER — Telehealth: Payer: Self-pay | Admitting: Pharmacist

## 2022-04-08 LAB — BASIC METABOLIC PANEL
BUN/Creatinine Ratio: 16 (ref 12–28)
BUN: 24 mg/dL (ref 8–27)
CO2: 25 mmol/L (ref 20–29)
Calcium: 9 mg/dL (ref 8.7–10.3)
Chloride: 104 mmol/L (ref 96–106)
Creatinine, Ser: 1.51 mg/dL — ABNORMAL HIGH (ref 0.57–1.00)
Glucose: 113 mg/dL — ABNORMAL HIGH (ref 70–99)
Potassium: 4.6 mmol/L (ref 3.5–5.2)
Sodium: 142 mmol/L (ref 134–144)
eGFR: 34 mL/min/{1.73_m2} — ABNORMAL LOW (ref >59)

## 2022-04-08 NOTE — Telephone Encounter (Signed)
Labs stable after starting spironolactone 12.5mg  daily and decreasing KCL to 10 MEQ daily ?Patient has been approved for pt assistance for [place]. She should start this when it arrives in the mail. ?Entresto PAP pending. Waiting on son to email proof of income.  ?Needs apt scheduled for 3-4 weeks ? ?Called and spoke with patient. She was relieved that labs were good and appreciative of the help with patient assistance. Her son is getting a copy of his tax return from [employer] and will drop it off or mail it when he gets it. ? ?Patient aware to continue current medications, start Wilder Glade when it comes in the mail. Follow up scheduled for 5/30 @ 10. ? ?

## 2022-04-13 NOTE — Telephone Encounter (Signed)
Pt states she just received Farxiga in the mail, she will start taking this today. States her son did not file any taxes last year and threw out his tax return documents from the year before.  ? ?[employer] pt assistance to see if they would process her application with just her tax return documentation since her son didn't file any taxes last year and had no income. They have noted this on the application. They also need rx refaxed apparently. This has been sent to them for the 3rd time. ?

## 2022-04-14 NOTE — Telephone Encounter (Signed)
**Note De-Identified D[place] Obfuscation** Letter received from [place] stating that they have approved the pt for Entresto assistance until 12/04/2022. ?Pt ID: [id number] ? ?The letter states that they have notified the pt of this approval as well. ?

## 2022-04-21 ENCOUNTER — Telehealth: Payer: Self-pay | Admitting: Pharmacist

## 2022-04-21 NOTE — Telephone Encounter (Signed)
Called [employer] to follow up with [facility] pt assistance application. Pt was approved through 12/04/22 and will need to call them to set up shipment at #[phone]. Pt is aware and was very appreciative for the assistance.

## 2022-05-03 ENCOUNTER — Ambulatory Visit (INDEPENDENT_AMBULATORY_CARE_PROVIDER_SITE_OTHER): Payer: Medicare Other | Admitting: Pharmacist

## 2022-05-03 VITALS — BP 112/58 | HR 78 | Wt 147.0 lb

## 2022-05-03 DIAGNOSIS — I5022 Chronic systolic (congestive) heart failure: Secondary | ICD-10-CM

## 2022-05-03 LAB — BASIC METABOLIC PANEL
BUN/Creatinine Ratio: 15 (ref 12–28)
BUN: 23 mg/dL (ref 8–27)
CO2: 20 mmol/L (ref 20–29)
Calcium: 8.9 mg/dL (ref 8.7–10.3)
Chloride: 105 mmol/L (ref 96–106)
Creatinine, Ser: 1.53 mg/dL — ABNORMAL HIGH (ref 0.57–1.00)
Glucose: 126 mg/dL — ABNORMAL HIGH (ref 70–99)
Potassium: 4.6 mmol/L (ref 3.5–5.2)
Sodium: 141 mmol/L (ref 134–144)
eGFR: 34 mL/min/{1.73_m2} — ABNORMAL LOW (ref >59)

## 2022-05-03 NOTE — Progress Notes (Unsigned)
Patient ID: Maria [name]                 DOB: 09-01-1941                      MRN: [id number]     HPI: Maria [name] is a 81 y.o. female referred by Dr. Gasper Sells to pharmacy clinic for HF medication management. PMH is significant for HTN, aortic atherosclerosis, HLD, CKD stage IIIa, and NICM. Most recent LVEF 20 to 25% on 02/17/22 with subsequent switch from valsartan to Entresto. Pt had cardiac cath on 02/25/22 that showed no evidence of CAD r/o ischemic causes. Pt wore Zio Patch for 2 weeks with results pending. At f/u with Dr. Gasper Sells, she was doing much better and he increased her Entresto to 49-51mg  BID and referred her to PharmD for further GDMT optimization. Labs on 03/18/22 were stable and Lasix dose was decreased from 40mg  to 20mg  daily. At PharmD appt on 4/27, started spironolactone 12.5mg  daily and decreased KCl from 100meq to 27meq daily, with stable f/u BMET on 04/07/22 (Scr 1.51, K 4.6). Dr. Gasper Sells increased metoprolol from 25 mg daily to 100 mg daily on 04/06/22 after reviewing Zio patch results showing frequent ectopy. Pt was approved for [name] and Farxiga patient assistance programs through Dec 2023. Pt started Farxiga 10 mg daily on 04/13/22.   Patient presents today for further HF medication titration. [name] and [place] were delivered to pt home via pt assistance programs. Denies dizziness or lightheadedness. Denies swelling in her ankles. Swelling in abdomen is not at baseline, but much improved. Home weights 146-147 lbs, consistent with clinic weight today 147 lbs (also 147 lbs at last PCP visit). Taking furosemide 20 mg daily. Returned to work on a limited schedule. Denies SOB with her daily activities or at work. Normal appetite. She does adheres to a low-salt diet and checks labels at the grocery store. Sleeps with 1 pillow under her head and 1 pillow under her feet. Denies excessive fatigue. Most bothersome symptom is a cough. Dr. Yong Channel prescribed benzonatate  which is not making a huge difference.   Home BP readings: Saturday SBP 137-140, p ~80. May have seen one reading SBP ~110-112.  Clinic readings: 104/52 mmHg, repeat: 112/58 mmHg  Current CHF meds: Entresto 49-51mg  BID, Toprol XL 100mg  daily, furosemide 20mg  daily, spironolactone 12.5 mg daily, Farxiga 10 mg daily  BP goal: <130/80 mmHg  Family History: CHF (mother)  Social History: Denies tobacco, illicit drug, and alcohol use. 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   Diet: Does not use salt at all. Does check labels at grocery store for salt content. Tries not to eat frozen meals  Wt Readings from Last 3 Encounters:  03/31/22 149 lb 12.8 oz (67.9 kg)  03/29/22 147 lb 6.4 oz (66.9 kg)  03/03/22 144 lb (65.3 kg)   BP Readings from Last 3 Encounters:  03/31/22 124/60  03/29/22 106/60  03/03/22 129/83   Pulse Readings from Last 3 Encounters:  03/31/22 95  03/29/22 (!) 58  03/03/22 68    Renal function: CrCl cannot be calculated (Patient's most recent lab result is older than the maximum 21 days allowed.).  Past Medical History:  Diagnosis Date   Arthritis    Bronchitis    hx of   GERD (gastroesophageal reflux disease)    History of bleeding ulcers    History of gastrointestinal hemorrhage    now  off aspirin   Hyperlipidemia    Hypertension    Osteoarthritis of right knee 06/03/2012    Current Outpatient Medications on File Prior to Visit  Medication Sig Dispense Refill   albuterol (VENTOLIN HFA) 108 (90 Base) MCG/ACT inhaler Inhale 2 puffs into the lungs every 6 (six) hours as needed for wheezing or shortness of breath. 8 g 0   atorvastatin (LIPITOR) 40 MG tablet TAKE 1 TABLET(40 MG) BY MOUTH DAILY 90 tablet 1   benzonatate (TESSALON) 100 MG capsule Take 1 capsule (100 mg total) by mouth 2 (two) times daily as needed for cough. 20 capsule 1   Calcium Carbonate-Vitamin D (CALCIUM PLUS VITAMIN D PO) Take 2 tablets by mouth  daily.     cyanocobalamin 100 MCG tablet Take 100 mcg by mouth daily.     dapagliflozin propanediol (FARXIGA) 10 MG TABS tablet Take 10 mg by mouth daily.     furosemide (LASIX) 20 MG tablet Take 1 tablet (20 mg total) by mouth daily. 90 tablet 3   metFORMIN (GLUCOPHAGE) 500 MG tablet TAKE 1 TABLET(500 MG) BY MOUTH DAILY WITH BREAKFAST 30 tablet 5   metoprolol succinate (TOPROL-XL) 100 MG 24 hr tablet Take 1 tablet (100 mg total) by mouth daily. Take with or immediately following a meal. 90 tablet 3   Multiple Vitamin (MULTI-DAY PO) Take 2 tablets by mouth daily. womens multivitiamin gummies 2 a day     omeprazole (PRILOSEC) 20 MG capsule TAKE 1 CAPSULE(20 MG) BY MOUTH DAILY 90 capsule 1   potassium chloride SA (KLOR-CON M) 20 MEQ tablet Take 0.5 tablets (10 mEq total) by mouth daily. 90 tablet 3   sacubitril-valsartan (ENTRESTO) 49-51 MG Take 1 tablet by mouth 2 (two) times daily. 180 tablet 3   spironolactone (ALDACTONE) 25 MG tablet Take 0.5 tablets (12.5 mg total) by mouth daily. 15 tablet 3   No current facility-administered medications on file prior to visit.    Allergies  Allergen Reactions   Coumadin [Warfarin Sodium]     Ulcers   Methocarbamol     Hallucinations   Norco [Hydrocodone-Acetaminophen]     Hallucinations   Assessment/Plan:  1. 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 Will obtain BMET today to evaluate SCr and electrolytes since starting Farxiga 10 mg daily. Will continue spironolactone 12.5 mg daily, Entresto 49-51 mg BID, metoprolol succinate 100 mg daily, and Lasix 20 mg daily. If K <5 mmol/L on BMET and Scr stable, will consider increasing spironolactone to 25 mg daily, stopping Kcl 10 meq daily and changing Lasix 20mg  from scheduled to prn dosing. If BMET precludes increasing spironolactone, will increase metoprolol succinate to 200 mg daily. Defer increasing Entresto dose as pt just  received 3 mo supply through pt assistance and BP trending lower on exam today. Will f/u via telephone to review BMET, communicate medication changes, and schedule f/u. Pt to continue monitoring BP, HR, and weight at home.   Patient seen with Park Liter, PY4 PharmD Candidate  Megan E. Supple, PharmD, BCACP, [place] [id number], [place], [place] [address] Phone: [phone]; Fax: [phone] 05/03/2022 8:09 AM

## 2022-05-03 NOTE — Patient Instructions (Signed)
It was great to see you today! Your blood pressure goal is less than 130/80. Your blood pressure today was 112/58. If your blood pressure is less than 100/50 give [place] a call.   Keep taking all your medications as prescribed. We will call you after we review your lab work to let you know if we need to make any changes.

## 2022-05-04 ENCOUNTER — Telehealth: Payer: Self-pay

## 2022-05-04 MED ORDER — FUROSEMIDE 20 MG PO TABS: 20.0000 mg | ORAL_TABLET | ORAL | 3 refills | Status: DC | PRN

## 2022-05-04 MED ORDER — SPIRONOLACTONE 25 MG PO TABS: 25.0000 mg | ORAL_TABLET | Freq: Every day | ORAL | 3 refills | Status: DC

## 2022-05-04 NOTE — Telephone Encounter (Signed)
Contacted Ms. Ehrmann to review lab work from 05/03/22 and discuss medication changes. BMET stable (Scr 1.53 mg/dL, K 4.6). Instructed pt to increase spironolactone to 25 mg daily and stop potassium chloride 10 meq daily. Also instructed pt to switch furosemide to 20 mg daily PRN for increase swelling in abdomen/legs/ankles or if she notices 2-3 lbs weight gain in one day or 5 lbs over a couple days. Scheduled repeat BMET in 1 week (05/11/22). Will f/u via telephone. May consider optimizing metoprolol dose at that time.

## 2022-05-11 ENCOUNTER — Ambulatory Visit: Payer: Medicare Other | Admitting: Internal Medicine

## 2022-05-11 ENCOUNTER — Other Ambulatory Visit: Payer: Medicare Other

## 2022-05-11 DIAGNOSIS — I5022 Chronic systolic (congestive) heart failure: Secondary | ICD-10-CM

## 2022-05-11 LAB — BASIC METABOLIC PANEL
BUN/Creatinine Ratio: 15 (ref 12–28)
BUN: 26 mg/dL (ref 8–27)
CO2: 24 mmol/L (ref 20–29)
Calcium: 9.1 mg/dL (ref 8.7–10.3)
Chloride: 107 mmol/L — ABNORMAL HIGH (ref 96–106)
Creatinine, Ser: 1.68 mg/dL — ABNORMAL HIGH (ref 0.57–1.00)
Glucose: 96 mg/dL (ref 70–99)
Potassium: 4.7 mmol/L (ref 3.5–5.2)
Sodium: 143 mmol/L (ref 134–144)
eGFR: 30 mL/min/{1.73_m2} — ABNORMAL LOW (ref >59)

## 2022-05-12 ENCOUNTER — Telehealth: Payer: Self-pay | Admitting: Pharmacist

## 2022-05-12 MED ORDER — METOPROLOL SUCCINATE ER 200 MG PO TB24: 200.0000 mg | ORAL_TABLET | Freq: Every day | ORAL | 3 refills | Status: DC

## 2022-05-12 NOTE — Telephone Encounter (Addendum)
SCr with slight increase (8.9%) on dose increase of spironolactone but overall BMET relatively stable. Lasix also changed from daily to prn, and K supplementation stopped. Pt now taking Farxiga 10mg  daily, Entresto 49-51mg  BID, spironolactone 25mg  daily, and Toprol 200mg  daily for her CHF.  Taking Lasix every other day, weight stable, had swelling in feet a few days ago but propped her feet which resolved swelling. 160/59 yesterday but thinks she checked her BP wrong, other readings have been 133/84, 120/81, 142/78, 150/88. Clinic readings consistently lower. Denies dizziness, balance is fine. HR 90, 87, 103 the other day.  Will increase Toprol from 100mg  to 200mg  daily. Continue Farxiga 10mg  daily, Entresto 49-51mg  BID, and spironolactone 25mg  daily. Will call pt in another few weeks to follow up on home BP and HR. Last CHF med change to make would be increasing her Entresto.

## 2022-06-01 ENCOUNTER — Telehealth: Payer: Self-pay | Admitting: Pharmacist

## 2022-06-01 NOTE — Telephone Encounter (Signed)
Called pt to f/u with CHF. Reports no swelling, not tiring out or SOB at work. Tolerating meds well. Doesn't think she's using her BP cuff correctly so hasn't been checking her readings. Sees her PCP on 7/14. If BP stable at that time, will call pt after to increase her Entresto to 97-103mg  BID. Advised she can likely bring in her home cuff to her PCP office and they can help her, otherwise if I increase her Sherryll Burger, would need f/u 2 weeks later for BP and BMET check and can assist pt then. She was appreciative for the call.

## 2022-06-03 ENCOUNTER — Other Ambulatory Visit: Payer: Self-pay | Admitting: Family Medicine

## 2022-06-03 ENCOUNTER — Other Ambulatory Visit: Payer: Self-pay | Admitting: Pharmacist

## 2022-06-03 MED ORDER — SPIRONOLACTONE 25 MG PO TABS: 25.0000 mg | ORAL_TABLET | Freq: Every day | ORAL | 3 refills | Status: DC

## 2022-06-03 NOTE — Telephone Encounter (Signed)
Pt reports she had a little diarrhea- 2 BM per day, a little loose- advised this is ok, just keep an eye on it. Wanted me to let Megan know. Urinating a lot but swelling is gone

## 2022-06-10 ENCOUNTER — Other Ambulatory Visit: Payer: Self-pay | Admitting: Family Medicine

## 2022-06-17 ENCOUNTER — Other Ambulatory Visit: Payer: Self-pay | Admitting: Family Medicine

## 2022-06-17 ENCOUNTER — Ambulatory Visit: Payer: Medicare Other | Admitting: Family Medicine

## 2022-06-24 ENCOUNTER — Encounter: Payer: Self-pay | Admitting: Family Medicine

## 2022-06-24 ENCOUNTER — Ambulatory Visit (INDEPENDENT_AMBULATORY_CARE_PROVIDER_SITE_OTHER): Payer: Medicare Other | Admitting: Family Medicine

## 2022-06-24 VITALS — BP 128/70 | HR 85 | Temp 97.9°F | Resp 16 | Ht 61.0 in | Wt 135.6 lb

## 2022-06-24 DIAGNOSIS — E785 Hyperlipidemia, unspecified: Secondary | ICD-10-CM | POA: Diagnosis not present

## 2022-06-24 DIAGNOSIS — N183 Chronic kidney disease, stage 3 unspecified: Secondary | ICD-10-CM | POA: Diagnosis not present

## 2022-06-24 DIAGNOSIS — R739 Hyperglycemia, unspecified: Secondary | ICD-10-CM | POA: Diagnosis not present

## 2022-06-24 DIAGNOSIS — I10 Essential (primary) hypertension: Secondary | ICD-10-CM | POA: Diagnosis not present

## 2022-06-24 DIAGNOSIS — I428 Other cardiomyopathies: Secondary | ICD-10-CM | POA: Diagnosis not present

## 2022-06-24 DIAGNOSIS — I700 Atherosclerosis of aorta: Secondary | ICD-10-CM | POA: Diagnosis not present

## 2022-06-24 DIAGNOSIS — I5022 Chronic systolic (congestive) heart failure: Secondary | ICD-10-CM

## 2022-06-24 DIAGNOSIS — I493 Ventricular premature depolarization: Secondary | ICD-10-CM | POA: Diagnosis not present

## 2022-06-24 NOTE — Patient Instructions (Addendum)
Health Maintenance Due  Topic Date Due   COVID-19 Vaccine (5 - Booster for Pfizer series) 07/27/2021  Consider in fall- likely October they will release the new covid vaccine  Schedule labs in about a week on your way out  No changes today- thrilled you are doing so well  Recommended follow up: Return in about 6 months (around 12/25/2022) for physical or sooner if needed.Schedule b4 you leave.

## 2022-06-24 NOTE — Progress Notes (Signed)
Phone [phone] In person visit   Subjective:   Maria [name] is a 81 y.o. year old very pleasant female patient who presents for/with See problem oriented charting Chief Complaint  Patient presents with   Hypertension   Hyperlipidemia   Gastroesophageal Reflux   Prediabetes    Past Medical History-  Patient Active Problem List   Diagnosis Date Noted   Chronic systolic CHF (congestive heart failure), NYHA class 3 (HCC)     Priority: High   NICM (nonischemic cardiomyopathy) (HCC)     Priority: High   History of gastrointestinal hemorrhage     Priority: High   Fatty liver 01/07/2022    Priority: Medium    Microalbuminuria 12/17/2020    Priority: Medium    Aortic atherosclerosis (HCC) 06/18/2020    Priority: Medium    Hyperglycemia 04/16/2020    Priority: Medium    Chronic kidney disease (CKD), stage III (moderate) (HCC) 10/04/2019    Priority: Medium    GERD (gastroesophageal reflux disease) 05/27/2014    Priority: Medium    Hyperlipidemia LDL goal <100 08/21/2008    Priority: Medium    Essential hypertension 08/21/2008    Priority: Medium    PVC (premature ventricular contraction) 02/14/2022    Priority: Low   Arthritis of right hip 10/10/2019    Priority: Low   Bursitis of right hip 10/10/2019    Priority: Low   Allergic rhinitis 10/03/2019    Priority: Low   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 8 g 0   atorvastatin (LIPITOR) 40 MG tablet TAKE 1 TABLET(40 MG) BY MOUTH DAILY 90 tablet 1   Calcium Carbonate-Vitamin D (CALCIUM PLUS VITAMIN D PO) Take 2 tablets by mouth daily.     cyanocobalamin 100 MCG tablet Take 100 mcg by  mouth daily.     dapagliflozin propanediol (FARXIGA) 10 MG TABS tablet Take 10 mg by mouth daily.     furosemide (LASIX) 20 MG tablet Take 1 tablet (20 mg total) by mouth as needed (swelling). 90 tablet 3   metFORMIN (GLUCOPHAGE) 500 MG tablet TAKE 1 TABLET(500 MG) BY MOUTH DAILY WITH BREAKFAST 30 tablet 5   metoprolol (TOPROL XL) 200 MG 24 hr tablet Take 1 tablet (200 mg total) by mouth daily. 90 tablet 3   Multiple Vitamin (MULTI-DAY PO) Take 2 tablets by mouth daily. womens multivitiamin gummies 2 a day     omeprazole (PRILOSEC) 20 MG capsule TAKE 1 CAPSULE(20 MG) BY MOUTH DAILY 90 capsule 1   sacubitril-valsartan (ENTRESTO) 49-51 MG Take 1 tablet by mouth 2 (two) times daily. 180 tablet 3   spironolactone (ALDACTONE) 25 MG tablet Take 1 tablet (25 mg total) by mouth daily. 90 tablet 3   No current facility-administered medications for this visit.     Objective:  BP 128/70   Pulse 85   Temp 97.9 F (36.6 C) (Temporal)   Resp 16   Ht 5\' 1"  (1.549 m)   Wt 135 lb 9.6 oz (61.5 kg)   SpO2 98%   BMI 25.62 kg/m  Gen: NAD, resting comfortably CV: RRR no murmurs rubs or gallops Lungs: CTAB no crackles, wheeze, rhonchi Ext:  no edema Skin: warm, dry    Assessment and Plan   # CHF with nonischemic cardiomyopathy- Dr. Izora Ribas cardiology diagnosed 2023 S:Medication: farxiga 10 mg, lasix 20 mg, entresto 49-51 mg, spironolactone 25 mg (still on 12.5 mg but about to increase to full tablet) - has had  cath 02/25/22 with no evidence of CAD- remain off aspirin Wt Readings from Last 3 Encounters:  06/24/22 135 lb 9.6 oz (61.5 kg)  05/03/22 147 lb (66.7 kg)  03/31/22 149 lb 12.8 oz (67.9 kg)  - weight continues to trend down on diuretics. No shortness of breath. Edema better. Prior cough much better. Doesn't need albuterol anymore A/P: appears much improved - continue to monitor and update CMP today- continue current meds  #Essential hypertension/CKD stage III with elevated microalbumin  to creatinine ratio S: Compliant withlasix 20 mg daily, metoprolol 200 mg XR, entresto 49-51 mg, spironolactone 25 mg- hasnt started yet higher dose  still on 12.5 mg BP Readings from Last 3 Encounters:  06/24/22 128/70  05/03/22 (!) 112/58  03/31/22 124/60  A/P: HTN- Controlled. Continue current medications.  CKD III- hopefully stable- update cmp today. Continue current meds for now   #Hyperlipidemia #aortic atherosclerosis S: Compliant with atorvastatin 40 mg.  LDL goal at least under 100. Lab Results  Component Value Date   CHOL 139 12/17/2021   HDL 43.00 12/17/2021   LDLCALC 86 12/17/2021   LDLDIRECT 100.0 07/23/2018   TRIG 52.0 12/17/2021   CHOLHDL 3 12/17/2021  A/P:  reasonable control with no noted CAD- think its reasonable to stay under 100 with aortic atherosclerosis (presumed stable). Continue current meds   #GERD S: Compliant with omeprazole 20 mg.  B12 last checked June 2022 -history of severe GI bleed years ago so keeping her on PPI despite CKD III A/P:Controlled. Continue current medications.    # Hyperglycemia/insulin resistance/prediabetes S:  Medication: Metformin 500 mg with breakfast started when A1c reached 6.1 and patient also had elevated microalbumin to creatinine ratio -also on farxiga for CHF  Lab Results  Component Value Date   HGBA1C 6.2 12/17/2021   HGBA1C 6.2 06/04/2021   HGBA1C 6.1 (H) 11/11/2020   A/P: hopefully stable- update a1c with labs. Continue current meds for now  Recommended follow up: Return in about 6 months (around 12/25/2022) for physical or sooner if needed.Schedule b4 you leave. She prefers 3 months Future Appointments  Date Time Provider Department Center  08/23/2022 10:20 AM Christell Constant, MD [facility]  09/02/2022  8:00 AM [facility]  12/19/2022 10:00 AM Shelva Majestic, MD [facility]    Lab/Order associations:   ICD-10-CM   1. Chronic systolic CHF (congestive heart  failure), NYHA class 3 (HCC)  I50.22     2. NICM (nonischemic cardiomyopathy) (HCC)  I42.8 CBC with Differential/Platelet    Comprehensive metabolic panel    3. Hyperlipidemia LDL goal <100  E78.5     4. Hyperglycemia  R73.9 HgB A1c    5. Essential hypertension  I10     6. Stage 3 chronic kidney disease, unspecified whether stage 3a or 3b CKD (HCC)  N18.30     7. PVC (premature ventricular contraction)  I49.3      No orders of the defined types were placed in this encounter.  Return precautions advised.  Tana Conch, MD

## 2022-06-29 ENCOUNTER — Other Ambulatory Visit (INDEPENDENT_AMBULATORY_CARE_PROVIDER_SITE_OTHER): Payer: Medicare Other

## 2022-06-29 DIAGNOSIS — I493 Ventricular premature depolarization: Secondary | ICD-10-CM

## 2022-06-29 DIAGNOSIS — R739 Hyperglycemia, unspecified: Secondary | ICD-10-CM

## 2022-06-29 DIAGNOSIS — I10 Essential (primary) hypertension: Secondary | ICD-10-CM

## 2022-06-29 DIAGNOSIS — N183 Chronic kidney disease, stage 3 unspecified: Secondary | ICD-10-CM

## 2022-06-29 DIAGNOSIS — I700 Atherosclerosis of aorta: Secondary | ICD-10-CM

## 2022-06-29 DIAGNOSIS — I428 Other cardiomyopathies: Secondary | ICD-10-CM

## 2022-06-29 LAB — COMPREHENSIVE METABOLIC PANEL
ALT: 12 U/L (ref 0–35)
AST: 25 U/L (ref 0–37)
Albumin: 4 g/dL (ref 3.5–5.2)
Alkaline Phosphatase: 138 U/L — ABNORMAL HIGH (ref 39–117)
BUN: 29 mg/dL — ABNORMAL HIGH (ref 6–23)
CO2: 25 mEq/L (ref 19–32)
Calcium: 9.6 mg/dL (ref 8.4–10.5)
Chloride: 104 mEq/L (ref 96–112)
Creatinine, Ser: 1.79 mg/dL — ABNORMAL HIGH (ref 0.40–1.20)
GFR: 26.11 mL/min — ABNORMAL LOW (ref >60.00)
Glucose, Bld: 108 mg/dL — ABNORMAL HIGH (ref 70–99)
Potassium: 5.1 mEq/L (ref 3.5–5.1)
Sodium: 138 mEq/L (ref 135–145)
Total Bilirubin: 0.5 mg/dL (ref 0.2–1.2)
Total Protein: 7.3 g/dL (ref 6.0–8.3)

## 2022-06-29 LAB — CBC WITH DIFFERENTIAL/PLATELET
Basophils Absolute: 0 10*3/uL (ref 0.0–0.1)
Basophils Relative: 0.5 % (ref 0.0–3.0)
Eosinophils Absolute: 0.1 10*3/uL (ref 0.0–0.7)
Eosinophils Relative: 2.3 % (ref 0.0–5.0)
HCT: 42.6 % (ref 36.0–46.0)
Hemoglobin: 13.7 g/dL (ref 12.0–15.0)
Lymphocytes Relative: 8.7 % — ABNORMAL LOW (ref 12.0–46.0)
Lymphs Abs: 0.5 10*3/uL — ABNORMAL LOW (ref 0.7–4.0)
MCHC: 32.2 g/dL (ref 30.0–36.0)
MCV: 87.4 fl (ref 78.0–100.0)
Monocytes Absolute: 0.3 10*3/uL (ref 0.1–1.0)
Monocytes Relative: 4.5 % (ref 3.0–12.0)
Neutro Abs: 4.9 10*3/uL (ref 1.4–7.7)
Neutrophils Relative %: 84 % — ABNORMAL HIGH (ref 43.0–77.0)
Platelets: 154 10*3/uL (ref 150.0–400.0)
RBC: 4.88 Mil/uL (ref 3.87–5.11)
RDW: 15.5 % (ref 11.5–15.5)
WBC: 5.8 10*3/uL (ref 4.0–10.5)

## 2022-06-29 LAB — HEMOGLOBIN A1C: Hgb A1c MFr Bld: 6.2 % (ref 4.6–6.5)

## 2022-08-22 NOTE — Progress Notes (Unsigned)
Cardiology Office Note:    Date:  08/23/2022   ID:  ZYKIRIA BRUENING, DOB 05/14/1941, MRN [id number]  PCP:  Marin Olp, MD   [facility] Providers Cardiologist:  Werner Lean, MD     Referring MD: Marin Olp, MD   CC: Heart failure follow up  History of Present Illness:    Maria [name] is a 81 y.o. female with a hx of HTN, aortic atherosclerosis and HLD, CKD stage IIIa. 2022: Found to have LVEF 25%.   Started on [place], no CAD on 02/25/22 cath. RA 12, PCWP 19.  Increase Entresto.  Sent to Pharm D clinic for financial assistance.  After that lost to follow up.  Patient notes that she is doing great.   Went on vacation: went to [place]. There are no interval hospital/ED visit.   Minimized salt intake, and no shortness of breath. 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 Rare muscle cramps resolved with mustards  No chest pain or pressure .  No SOB/DOE and no PND/Orthopnea.  No weight gain or leg swelling.  No palpitations or syncope.  Has had a bit cough.  Had lost 10 lbs.    Ambulatory blood pressure 130/62.   Past Medical History:  Diagnosis Date   Arthritis    Bronchitis    hx of   GERD (gastroesophageal reflux disease)    History of bleeding ulcers    History of gastrointestinal hemorrhage    now off aspirin   Hyperlipidemia    Hypertension    Osteoarthritis of right knee 06/03/2012    Past Surgical History:  Procedure Laterality Date   ABDOMINAL HYSTERECTOMY     nonmalignant reasons   APPENDECTOMY     RIGHT/LEFT HEART CATH AND CORONARY ANGIOGRAPHY N/A 02/25/2022   Procedure: RIGHT/LEFT HEART CATH AND CORONARY ANGIOGRAPHY;  Surgeon: Burnell Blanks, MD;  Location: [place] [facility];  Service: Cardiovascular;  Laterality: N/A;   TOTAL KNEE ARTHROPLASTY  05/30/2012   Procedure: TOTAL KNEE ARTHROPLASTY;  Surgeon: Ninetta Lights, MD;  Location: [place];  Service: Orthopedics;  Laterality: Right;  DR MURPHY WANTS 90 MINUTES FOR THIS CASE.  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   Procedure: LEFT TOTAL KNEE ARTHROPLASTY;  Surgeon: Ninetta Lights, MD;  Location: [place];  Service: Orthopedics;  Laterality: Left;    Current Medications: Current Meds  Medication Sig   albuterol (VENTOLIN HFA) 108 (90 Base) MCG/ACT inhaler Inhale 2 puffs into the lungs every 6 (six) hours as needed for wheezing or shortness of breath.   atorvastatin (LIPITOR) 40 MG tablet TAKE 1 TABLET(40 MG) BY MOUTH DAILY   Calcium Carbonate-Vitamin D (CALCIUM PLUS VITAMIN D PO) Take 2 tablets by mouth daily.   cyanocobalamin 100 MCG tablet Take 100 mcg by mouth daily.   dapagliflozin propanediol (FARXIGA) 10 MG TABS tablet Take 10 mg by mouth daily.   furosemide (LASIX) 20 MG tablet Take 1 tablet (20 mg total) by mouth as needed (swelling).   metFORMIN (GLUCOPHAGE) 500 MG tablet TAKE 1 TABLET(500 MG) BY MOUTH DAILY WITH BREAKFAST   metoprolol (TOPROL XL) 200 MG 24 hr tablet Take 1 tablet (200 mg total) by mouth daily.   Multiple Vitamin (MULTI-DAY PO) Take 2 tablets by mouth daily. womens multivitiamin gummies 2 a day   omeprazole (PRILOSEC) 20 MG capsule TAKE 1 CAPSULE(20 MG) BY MOUTH DAILY   sacubitril-valsartan (ENTRESTO) 49-51 MG Take 1 tablet by mouth 2 (two) times daily.   spironolactone (ALDACTONE)  25 MG tablet Take 1 tablet (25 mg total) by mouth daily.     Allergies:   Coumadin [warfarin sodium], Methocarbamol, and Norco [hydrocodone-acetaminophen]   Social History   Socioeconomic History   Marital status: Divorced    Spouse name: Not on file   Number of children: Not on file   Years of education: Not on file   Highest education level: Not on file  Occupational History   Occupation: day care   Tobacco Use   Smoking status: Never   Smokeless tobacco: Never  Substance and Sexual Activity   Alcohol use: No   Drug use: No   Sexual activity: Not on file  Other Topics Concern   Not on file  Social History Narrative   Lives alone.  Great granddaughter stays with her on weekends.    Divorced 42.       Still doing childcare   Retired from [employer] Strain: [place]  (08/23/2021)   Overall Financial Resource Strain (CARDIA)    Difficulty of Paying Living Expenses: Not hard at all  Food Insecurity: No Food Insecurity (08/23/2021)   Hunger Vital Sign    Worried About Running Out of Food in the Last Year: Never true    [place] in the Last Year: Never true  Transportation Needs: No Transportation Needs (08/23/2021)   PRAPARE - Hydrologist (Medical): No    Lack of Transportation (Non-Medical): No  Physical Activity: Inactive (08/23/2021)   Exercise Vital Sign    Days of Exercise per Week: 0 days    Minutes of Exercise per Session: 0 min  Stress: No Stress Concern Present (08/23/2021)   [place]    Feeling of Stress : Not at all  Social Connections: Moderately Isolated (08/23/2021)   Social Connection and Isolation Panel [NHANES]    Frequency of Communication with Friends and Family: More than three times a week    Frequency of Social Gatherings with Friends and Family: More than three times a week    Attends Religious Services: More than 4 times per year    Active Member of [employer] or Organizations: No    Attends Archivist Meetings: Never    Marital Status: Divorced    Social: Very Religious, has son; they go on vacation to [place] every year  Family History: The patient's family history includes Congestive Heart Failure in her mother; Other in her father.  ROS:   Please see the history of present illness.     All other systems reviewed and are negative.  EKGs/Labs/Other Studies Reviewed:    The following studies were reviewed today:  EKG:   02/14/22: Sinus tachycardia with frequent PVCs  RIGHT/LEFT HEART CATH AND CORONARY  ANGIOGRAPHY 02/25/2022  Narrative No angiographic evidence of CAD  RA 12 RV 57/0/14 PA 62/24 (mean 40) PCWP 19 LV 131/16/18 AO 119/59    ECHO COMPLETE WITH IMAGING ENHANCING AGENT 02/17/2022  Narrative ECHOCARDIOGRAM REPORT    Patient Name:   Maria [name] [facility] Date of Exam: 02/17/2022 Medical Rec #:  [id number]        Height:       61.0 in Accession #:    [id number]       Weight:       149.2 lb Date of Birth:  05/13/1941         BSA:  1.668 m Patient Age:    37 years         BP:           132/68 mmHg Patient Gender: F                HR:           79 bpm. Exam Location:  [address]  Procedure: 2D Echo, Cardiac Doppler, Color Doppler and Intracardiac Opacification Agent  Indications:    R06.00 Dyspnea  History:        Patient has prior history of Echocardiogram examinations, most recent 04/27/2020. Arrythmias:PVC; Risk Factors:Hypertension and Dyslipidemia. CKD stage 3.  Sonographer:    Basilia Jumbo BS, RDCS Referring Phys: [place]   1. Left ventricular ejection fraction, by estimation, is 20 to 25%. The left ventricle has severely decreased function. The left ventricle demonstrates regional wall motion abnormalities (see scoring diagram/findings for description). Left ventricular diastolic parameters are indeterminate. There is akinesis of the left ventricular, mid-apical inferolateral wall, anteroseptal wall, inferoseptal wall and inferior wall. There is hypokinesis of the left ventricular, mid-apical anterolateral wall and anterior wall. There is hypokinesis of the left ventricular, basal-mid anteroseptal wall and inferoseptal wall. 2. Right ventricular systolic function is mildly reduced. The right ventricular size is mildly enlarged. There is moderately elevated pulmonary artery systolic pressure. 3. Right atrial size was mildly dilated. 4. The mitral valve is grossly normal. Moderate to severe mitral valve regurgitation. No evidence  of mitral stenosis. 5. Tricuspid valve regurgitation is moderate. 6. The aortic valve is tricuspid. There is mild calcification of the aortic valve. There is mild thickening of the aortic valve. Aortic valve regurgitation is not visualized. Aortic valve sclerosis is present, with no evidence of aortic valve stenosis. 7. The inferior vena cava is dilated in size with >50% respiratory variability, suggesting right atrial pressure of 8 mmHg.  Comparison(s): Changes from prior study are noted. 04/27/20 EF 60-65%.  FINDINGS Left Ventricle: Left ventricular ejection fraction, by estimation, is 20 to 25%. The left ventricle has severely decreased function. The left ventricle demonstrates regional wall motion abnormalities. The left ventricular internal cavity size was normal in size. There is no left ventricular hypertrophy. Left ventricular diastolic parameters are indeterminate.  Right Ventricle: The right ventricular size is mildly enlarged. Right vetricular wall thickness was not well visualized. Right ventricular systolic function is mildly reduced. There is moderately elevated pulmonary artery systolic pressure. The tricuspid regurgitant velocity is 3.13 m/s, and with an assumed right atrial pressure of 8 mmHg, the estimated right ventricular systolic pressure is 123456 mmHg.  Left Atrium: Left atrial size was normal in size.  Right Atrium: Right atrial size was mildly dilated.  Pericardium: There is no evidence of pericardial effusion.  Mitral Valve: The mitral valve is grossly normal. Moderate to severe mitral valve regurgitation. No evidence of mitral valve stenosis.  Tricuspid Valve: The tricuspid valve is grossly normal. Tricuspid valve regurgitation is moderate . No evidence of tricuspid stenosis.  Aortic Valve: The aortic valve is tricuspid. There is mild calcification of the aortic valve. There is mild thickening of the aortic valve. Aortic valve regurgitation is not visualized. Aortic  valve sclerosis is present, with no evidence of aortic valve stenosis.  Pulmonic Valve: The pulmonic valve was grossly normal. Pulmonic valve regurgitation is trivial. No evidence of pulmonic stenosis.  Aorta: The aortic root, ascending aorta, aortic arch and descending aorta are all structurally normal, with no evidence of dilitation or  obstruction.  Venous: The inferior vena cava is dilated in size with greater than 50% respiratory variability, suggesting right atrial pressure of 8 mmHg.  IAS/Shunts: The interatrial septum was not well visualized.    LONG TERM MONITOR (8-14 DAYS) INTERPRETATION 04/05/2022  Narrative  Patient had a minimum heart rate of 71 bpm, maximum heart rate of 240 bpm (NSVT) , and average heart rate of 97 bpm.  Predominant underlying rhythm was sinus rhythm.  Many runs of NSVT lasting 21 beats beats at longest. Some reported NSVT runs are artifact.  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  Isolated PACs were occasional (4.5%).  Isolated PVCs were occasional (4.5%).  No evidence of significant heart block .  No triggered and diary events.  Asymptomatic SVT and NSVT.     Recent Labs: 01/07/2022: Brain Natriuretic Peptide CANCELED 02/08/2022: TSH 0.91 02/22/2022: NT-Pro BNP 16,027 06/29/2022: ALT 12; BUN 29; Creatinine, Ser 1.79; Hemoglobin 13.7; Platelets 154.0; Potassium 5.1; Sodium 138  Recent Lipid Panel    Component Value Date/Time   CHOL 139 12/17/2021 1033   TRIG 52.0 12/17/2021 1033   HDL 43.00 12/17/2021 1033   CHOLHDL 3 12/17/2021 1033   VLDL 10.4 12/17/2021 1033   LDLCALC 86 12/17/2021 1033   LDLCALC 89 11/11/2020 0939   LDLDIRECT 100.0 07/23/2018 1108        Physical Exam:    VS:  BP 130/62   Pulse 76   Ht 5\' 1"  (1.549 m)   Wt 138 lb 9.6 oz (62.9 kg)   SpO2 96%   BMI 26.19 kg/m     Wt Readings from Last 3 Encounters:  08/23/22 138 lb 9.6 oz (62.9 kg)   06/24/22 135 lb 9.6 oz (61.5 kg)  05/03/22 147 lb (66.7 kg)    Gen: No distress   Cardiac: No Rubs or Gallops, regular rhythm with holosystolic murmur Respiratory: Clear to auscultation bilaterally, normal effort, normal  respiratory rate GI: Soft, nontender, non-distended  MS: no edema;  moves all extremities Integument: Skin feels warm Neuro:  At time of evaluation, alert and oriented to person/place/time/situation  Psych: Normal affect, patient feels better   ASSESSMENT:    1. HFrEF (heart failure with reduced ejection fraction) ([place])   2. Hyperlipidemia LDL goal <100     PLAN:    HFrEF  - NYHA Class I Stage C, Unclear etiology, euvolemic Prior PVCs HTN Aortic Athosclerosis and HLD CKD Stage IIIa - continue lasix 20 mg Po daily - Entresto 49/51, CMP, BNP, and lipids today; likely will increase to 97/101 - on aldactone 25 mg PO - on metoprolol 200 mg  - ASA 81 continued - continue atorvastatin 40 mg  - will repeat echo; if no LV recovery would get CMR (with 1 mg ativan) and repeat ziopatch for PVC burden  Three to four months with me       Medication Adjustments/Labs and Tests Ordered: Current medicines are reviewed at length with the patient today.  Concerns regarding medicines are outlined above.  Orders Placed This Encounter  Procedures   Comprehensive metabolic panel   Pro b natriuretic peptide (BNP)   Lipid panel   ECHOCARDIOGRAM COMPLETE   No orders of the defined types were placed in this encounter.   Patient Instructions  Medication Instructions:  Your physician recommends that you continue on your current medications as directed. Please refer to the Current Medication list given to  you today.  *If you need a refill on your cardiac medications before your next appointment, please call your pharmacy*   Lab Work: TODAY: CMP, BNP, FLP  If you have labs (blood work) drawn today and your tests are completely normal, you will receive your results  only by: [place] (if you have MyChart) OR A paper copy in the mail If you have any lab test that is abnormal or we need to change your treatment, we will call you to review the results.   Testing/Procedures: Your physician has requested that you have an echocardiogram. Echocardiography is a painless test that uses sound waves to create images of your heart. It provides your doctor with information about the size and shape of your heart and how well your heart's chambers and valves are working. This procedure takes approximately one hour. There are no restrictions for this procedure.    Follow-Up: At [facility], you and your health needs are our priority.  As part of our continuing mission to provide you with exceptional heart care, we have created designated Provider Care Teams.  These Care Teams include your primary Cardiologist (physician) and Advanced Practice Providers (APPs -  Physician Assistants and Nurse Practitioners) who all work together to provide you with the care you need, when you need it.  We recommend signing up for the patient portal called "MyChart".  Sign up information is provided on this After Visit Summary.  MyChart is used to connect with patients for Virtual Visits (Telemedicine).  Patients are able to view lab/test results, encounter notes, upcoming appointments, etc.  Non-urgent messages can be sent to your provider as well.   To learn more about what you can do with MyChart, go to [email or web address].    Your next appointment:   3-4 month(s)  The format for your next appointment:   In Person  Provider:   Werner Lean, MD      Important Information About Sugar         Signed, Werner Lean, MD  08/23/2022 12:04 PM    [place]

## 2022-08-23 ENCOUNTER — Ambulatory Visit: Payer: Medicare Other | Attending: Internal Medicine | Admitting: Internal Medicine

## 2022-08-23 ENCOUNTER — Encounter: Payer: Self-pay | Admitting: Internal Medicine

## 2022-08-23 VITALS — BP 130/62 | HR 76 | Ht 61.0 in | Wt 138.6 lb

## 2022-08-23 DIAGNOSIS — I493 Ventricular premature depolarization: Secondary | ICD-10-CM

## 2022-08-23 DIAGNOSIS — I5020 Unspecified systolic (congestive) heart failure: Secondary | ICD-10-CM

## 2022-08-23 DIAGNOSIS — E785 Hyperlipidemia, unspecified: Secondary | ICD-10-CM

## 2022-08-23 NOTE — Patient Instructions (Signed)
Medication Instructions:  Your physician recommends that you continue on your current medications as directed. Please refer to the Current Medication list given to you today.  *If you need a refill on your cardiac medications before your next appointment, please call your pharmacy*   Lab Work: TODAY: CMP, BNP, FLP  If you have labs (blood work) drawn today and your tests are completely normal, you will receive your results only by: [place] (if you have MyChart) OR A paper copy in the mail If you have any lab test that is abnormal or we need to change your treatment, we will call you to review the results.   Testing/Procedures: Your physician has requested that you have an echocardiogram. Echocardiography is a painless test that uses sound waves to create images of your heart. It provides your doctor with information about the size and shape of your heart and how well your heart's chambers and valves are working. This procedure takes approximately one hour. There are no restrictions for this procedure.    Follow-Up: At [facility], you and your health needs are our priority.  As part of our continuing mission to provide you with exceptional heart care, we have created designated Provider Care Teams.  These Care Teams include your primary Cardiologist (physician) and Advanced Practice Providers (APPs -  Physician Assistants and Nurse Practitioners) who all work together to provide you with the care you need, when you need it.  We recommend signing up for the patient portal called "MyChart".  Sign up information is provided on this After Visit Summary.  MyChart is used to connect with patients for Virtual Visits (Telemedicine).  Patients are able to view lab/test results, encounter notes, upcoming appointments, etc.  Non-urgent messages can be sent to your provider as well.   To learn more about what you can do with MyChart, go to [email or web address].    Your next  appointment:   3-4 month(s)  The format for your next appointment:   In Person  Provider:   Werner Lean, MD      Important Information About Sugar

## 2022-08-24 LAB — COMPREHENSIVE METABOLIC PANEL
ALT: 8 IU/L (ref 0–32)
AST: 20 IU/L (ref 0–40)
Albumin/Globulin Ratio: 1.7 (ref 1.2–2.2)
Albumin: 4.2 g/dL (ref 3.7–4.7)
Alkaline Phosphatase: 123 IU/L — ABNORMAL HIGH (ref 44–121)
BUN/Creatinine Ratio: 21 (ref 12–28)
BUN: 33 mg/dL — ABNORMAL HIGH (ref 8–27)
Bilirubin Total: 0.5 mg/dL (ref 0.0–1.2)
CO2: 25 mmol/L (ref 20–29)
Calcium: 9.7 mg/dL (ref 8.7–10.3)
Chloride: 102 mmol/L (ref 96–106)
Creatinine, Ser: 1.59 mg/dL — ABNORMAL HIGH (ref 0.57–1.00)
Globulin, Total: 2.5 g/dL (ref 1.5–4.5)
Glucose: 97 mg/dL (ref 70–99)
Potassium: 4.8 mmol/L (ref 3.5–5.2)
Sodium: 139 mmol/L (ref 134–144)
Total Protein: 6.7 g/dL (ref 6.0–8.5)
eGFR: 32 mL/min/{1.73_m2} — ABNORMAL LOW (ref >59)

## 2022-08-24 LAB — LIPID PANEL
Chol/HDL Ratio: 3 ratio (ref 0.0–4.4)
Cholesterol, Total: 172 mg/dL (ref 100–199)
HDL: 57 mg/dL (ref >39)
LDL Chol Calc (NIH): 106 mg/dL — ABNORMAL HIGH (ref 0–99)
Triglycerides: 46 mg/dL (ref 0–149)
VLDL Cholesterol Cal: 9 mg/dL (ref 5–40)

## 2022-08-24 LAB — PRO B NATRIURETIC PEPTIDE: NT-Pro BNP: 3640 pg/mL — ABNORMAL HIGH (ref 0–738)

## 2022-08-26 ENCOUNTER — Other Ambulatory Visit: Payer: Self-pay | Admitting: *Deleted

## 2022-08-26 DIAGNOSIS — N183 Chronic kidney disease, stage 3 unspecified: Secondary | ICD-10-CM

## 2022-08-26 DIAGNOSIS — I493 Ventricular premature depolarization: Secondary | ICD-10-CM

## 2022-08-26 MED ORDER — SACUBITRIL-VALSARTAN 97-103 MG PO TABS: 1.0000 | ORAL_TABLET | Freq: Two times a day (BID) | ORAL | 6 refills | Status: DC

## 2022-08-29 ENCOUNTER — Telehealth: Payer: Self-pay | Admitting: Internal Medicine

## 2022-08-29 NOTE — Telephone Encounter (Signed)
Called pt advised to take 2 of the Entresto 49/51 to equal increases dose of 97/103 until pharmacy system is working.  Pt reports has 2 bottles of the 49/51 mg dose left.  Pt will start taking increased dose on morning of 08/30/22.  F/U lab appointment rescheduled to 09/08/22. Pt had no further questions or concerns.

## 2022-08-29 NOTE — Telephone Encounter (Signed)
Pt c/o medication issue:  1. Name of Medication: sacubitril-valsartan (ENTRESTO) 97-103 MG  2. How are you currently taking this medication (dosage and times per day)? Take 1 tablet by mouth 2 (two) times daily.  3. Are you having a reaction (difficulty breathing--STAT)? no  4. What is your medication issue? Patient called to say that [facility] system is down so she is unable to get them. Calling to see what she needs to do.. Please advise

## 2022-08-30 MED ORDER — SACUBITRIL-VALSARTAN 97-103 MG PO TABS: 1.0000 | ORAL_TABLET | Freq: Two times a day (BID) | ORAL | 3 refills | Status: DC

## 2022-08-30 NOTE — Telephone Encounter (Signed)
Pt is enrolled in pt assistance. Rx for Maria [name] needs to go to [facility] in [place]. Rx sent. Called pt and advised not to get from [facility], will come in mail from patient assistance.

## 2022-08-30 NOTE — Addendum Note (Signed)
Addended by: Marcelle Overlie D on: 08/30/2022 08:50 AM   Modules accepted: Orders

## 2022-09-01 ENCOUNTER — Other Ambulatory Visit: Payer: Medicare Other

## 2022-09-02 ENCOUNTER — Other Ambulatory Visit: Payer: Medicare Other

## 2022-09-02 ENCOUNTER — Ambulatory Visit (INDEPENDENT_AMBULATORY_CARE_PROVIDER_SITE_OTHER): Payer: Medicare Other

## 2022-09-02 VITALS — BP 120/68 | HR 69 | Temp 97.8°F | Wt 137.8 lb

## 2022-09-02 DIAGNOSIS — Z0000 Encounter for general adult medical examination without abnormal findings: Secondary | ICD-10-CM

## 2022-09-02 NOTE — Patient Instructions (Signed)
Maria [name] , Thank you for taking time to come for your Medicare Wellness Visit. I appreciate your ongoing commitment to your health goals. Please review the following plan we discussed and let me know if I can assist you in the future.   These are the goals we discussed:  Goals      Patient Stated     Keep living for God and do the best I can     Patient Stated     Lose weight      Patient Stated     Lose weight      Weight (lb) < 145 lb (65.8 kg)     Works 3 days with children;  Line dancing one day Pick up 2 days of walking Replace one meal with shake  Fat free or low fat dairy products Fish high in omega-3 acids ( salmon, tuna, trout) Fruits, such as apples, bananas, oranges, pears, prunes Legumes, such as kidney beans, lentils, checkpeas, black-eyed peas and lima beans Vegetables; broccoli, cabbage, carrots Whole grains;   Plant fats are better; decrease "white" foods as pasta, rice, bread and desserts, sugar; Avoid red meat (limiting) palm and coconut oils; sugary foods and beverages  Two nutrients that raise blood chol levels are saturated fats and trans fat; in hydrogenated oils and fats, as stick margarine, baked goods (cookes, cakes, pies, crackers; frosting; and coffee creamers;   Some Fats lower cholesterol: Monounsaturated and polyunsaturated  Avocados Corn, sunflower, and soybean oils Nuts and seeds, such as walnuts Olive, canola, peanut, safflower, and sesame oils Peanut butter Salmon and trout Tofu           This is a list of the screening recommended for you and due dates:  Health Maintenance  Topic Date Due   Zoster (Shingles) Vaccine (1 of 2) Never done   COVID-19 Vaccine (5 - Pfizer risk series) 07/27/2021   Flu Shot  07/05/2022   Tetanus Vaccine  10/29/2022   Mammogram  11/10/2022   Pneumonia Vaccine  Completed   DEXA scan (bone density measurement)  Completed   HPV Vaccine  Aged Out    Advanced directives: Please bring a copy of your  health care power of attorney and living will to the office at your convenience.   Conditions/risks identified: keep weight down and maintain health   Next appointment: Follow up in one year for your annual wellness visit    Preventive Care 65 Years and Older, Female Preventive care refers to lifestyle choices and visits with your health care provider that can promote health and wellness. What does preventive care include? A yearly physical exam. This is also called an annual well check. Dental exams once or twice a year. Routine eye exams. Ask your health care provider how often you should have your eyes checked. Personal lifestyle choices, including: Daily care of your teeth and gums. Regular physical activity. Eating a healthy diet. Avoiding tobacco and drug use. Limiting alcohol use. Practicing safe sex. Taking low-dose aspirin every day. Taking vitamin and mineral supplements as recommended by your health care provider. What happens during an annual well check? The services and screenings done by your health care provider during your annual well check will depend on your age, overall health, lifestyle risk factors, and family history of disease. Counseling  Your health care provider may ask you questions about your: Alcohol use. Tobacco use. Drug use. Emotional well-being. Home and relationship well-being. Sexual activity. Eating habits. History of falls. Memory and ability to  understand (cognition). Work and work Astronomer. Reproductive health. Screening  You may have the following tests or measurements: Height, weight, and BMI. Blood pressure. Lipid and cholesterol levels. These may be checked every 5 years, or more frequently if you are over 4 years old. Skin check. Lung cancer screening. You may have this screening every year starting at age 89 if you have a 30-pack-year history of smoking and currently smoke or have quit within the past 15 years. Fecal occult  blood test (FOBT) of the stool. You may have this test every year starting at age 47. Flexible sigmoidoscopy or colonoscopy. You may have a sigmoidoscopy every 5 years or a colonoscopy every 10 years starting at age 75. Hepatitis C blood test. Hepatitis B blood test. Sexually transmitted disease (STD) testing. Diabetes screening. This is done by checking your blood sugar (glucose) after you have not eaten for a while (fasting). You may have this done every 1-3 years. Bone density scan. This is done to screen for osteoporosis. You may have this done starting at age 16. Mammogram. This may be done every 1-2 years. Talk to your health care provider about how often you should have regular mammograms. Talk with your health care provider about your test results, treatment options, and if necessary, the need for more tests. Vaccines  Your health care provider may recommend certain vaccines, such as: Influenza vaccine. This is recommended every year. Tetanus, diphtheria, and acellular pertussis (Tdap, Td) vaccine. You may need a Td booster every 10 years. Zoster vaccine. You may need this after age 12. Pneumococcal 13-valent conjugate (PCV13) vaccine. One dose is recommended after age 31. Pneumococcal polysaccharide (PPSV23) vaccine. One dose is recommended after age 73. Talk to your health care provider about which screenings and vaccines you need and how often you need them. This information is not intended to replace advice given to you by your health care provider. Make sure you discuss any questions you have with your health care provider. Document Released: 12/18/2015 Document Revised: 08/10/2016 Document Reviewed: 09/22/2015 Elsevier Interactive Patient Education  2017 [employer].  Fall Prevention in the Home Falls can cause injuries. They can happen to people of all ages. There are many things you can do to make your home safe and to help prevent falls. What can I do on the outside of my  home? Regularly fix the edges of walkways and driveways and fix any cracks. Remove anything that might make you trip as you walk through a door, such as a raised step or threshold. Trim any bushes or trees on the path to your home. Use bright outdoor lighting. Clear any walking paths of anything that might make someone trip, such as rocks or tools. Regularly check to see if handrails are loose or broken. Make sure that both sides of any steps have handrails. Any raised decks and porches should have guardrails on the edges. Have any leaves, snow, or ice cleared regularly. Use sand or salt on walking paths during winter. Clean up any spills in your garage right away. This includes oil or grease spills. What can I do in the bathroom? Use night lights. Install grab bars by the toilet and in the tub and shower. Do not use towel bars as grab bars. Use non-skid mats or decals in the tub or shower. If you need to sit down in the shower, use a plastic, non-slip stool. Keep the floor dry. Clean up any water that spills on the floor as soon as it  happens. Remove soap buildup in the tub or shower regularly. Attach bath mats securely with double-sided non-slip rug tape. Do not have throw rugs and other things on the floor that can make you trip. What can I do in the bedroom? Use night lights. Make sure that you have a light by your bed that is easy to reach. Do not use any sheets or blankets that are too big for your bed. They should not hang down onto the floor. Have a firm chair that has side arms. You can use this for support while you get dressed. Do not have throw rugs and other things on the floor that can make you trip. What can I do in the kitchen? Clean up any spills right away. Avoid walking on wet floors. Keep items that you use a lot in easy-to-reach places. If you need to reach something above you, use a strong step stool that has a grab bar. Keep electrical cords out of the way. Do  not use floor polish or wax that makes floors slippery. If you must use wax, use non-skid floor wax. Do not have throw rugs and other things on the floor that can make you trip. What can I do with my stairs? Do not leave any items on the stairs. Make sure that there are handrails on both sides of the stairs and use them. Fix handrails that are broken or loose. Make sure that handrails are as long as the stairways. Check any carpeting to make sure that it is firmly attached to the stairs. Fix any carpet that is loose or worn. Avoid having throw rugs at the top or bottom of the stairs. If you do have throw rugs, attach them to the floor with carpet tape. Make sure that you have a light switch at the top of the stairs and the bottom of the stairs. If you do not have them, ask someone to add them for you. What else can I do to help prevent falls? Wear shoes that: Do not have high heels. Have rubber bottoms. Are comfortable and fit you well. Are closed at the toe. Do not wear sandals. If you use a stepladder: Make sure that it is fully opened. Do not climb a closed stepladder. Make sure that both sides of the stepladder are locked into place. Ask someone to hold it for you, if possible. Clearly mark and make sure that you can see: Any grab bars or handrails. First and last steps. Where the edge of each step is. Use tools that help you move around (mobility aids) if they are needed. These include: Canes. Walkers. Scooters. Crutches. Turn on the lights when you go into a dark area. Replace any light bulbs as soon as they burn out. Set up your furniture so you have a clear path. Avoid moving your furniture around. If any of your floors are uneven, fix them. If there are any pets around you, be aware of where they are. Review your medicines with your doctor. Some medicines can make you feel dizzy. This can increase your chance of falling. Ask your doctor what other things that you can do to  help prevent falls. This information is not intended to replace advice given to you by your health care provider. Make sure you discuss any questions you have with your health care provider. Document Released: 09/17/2009 Document Revised: 04/28/2016 Document Reviewed: 12/26/2014 Elsevier Interactive Patient Education  2017 [employer].

## 2022-09-02 NOTE — Progress Notes (Addendum)
Subjective:   Maria [name] is a 81 y.o. female who presents for Medicare Annual (Subsequent) preventive examination.  Review of Systems     Cardiac Risk Factors include: advanced age (>49men, >65 women);hypertension;dyslipidemia     Objective:    Today's Vitals   09/02/22 0835  BP: 120/68  Pulse: 69  Temp: 97.8 F (36.6 C)  SpO2: 93%  Weight: 137 lb 12.8 oz (62.5 kg)   Body mass index is 26.04 kg/m.     09/02/2022    8:48 AM 02/25/2022    7:58 AM 08/23/2021    8:14 AM 07/20/2020    2:58 PM 07/17/2019    8:57 AM 04/26/2018    9:21 AM 10/13/2016   10:14 AM  Advanced Directives  Does Patient Have a Medical Advance Directive? Yes No Yes Yes Yes Yes Yes  Type of Estate agent of [place];Living will   Healthcare Power of [place];Living will Healthcare Power of [place];Living will    Does patient want to make changes to medical advance directive?   Yes (MAU/Ambulatory/Procedural Areas - Information given)  No - Patient declined    Copy of Healthcare Power of Attorney in Chart? No - copy requested   No - copy requested No - copy requested  No - copy requested  Would patient like information on creating a medical advance directive?  No - Patient declined         Current Medications (verified) Outpatient Encounter Medications as of 09/02/2022  Medication Sig   atorvastatin (LIPITOR) 40 MG tablet TAKE 1 TABLET(40 MG) BY MOUTH DAILY   Calcium Carbonate-Vitamin D (CALCIUM PLUS VITAMIN D PO) Take 2 tablets by mouth daily.   cyanocobalamin 100 MCG tablet Take 100 mcg by mouth daily.   dapagliflozin propanediol (FARXIGA) 10 MG TABS tablet Take 10 mg by mouth daily.   furosemide (LASIX) 20 MG tablet Take 1 tablet (20 mg total) by mouth as needed (swelling).   metFORMIN (GLUCOPHAGE) 500 MG tablet TAKE 1 TABLET(500 MG) BY MOUTH DAILY WITH BREAKFAST   metoprolol (TOPROL XL) 200 MG 24 hr tablet Take 1 tablet (200 mg total) by mouth daily.   Multiple Vitamin  (MULTI-DAY PO) Take 2 tablets by mouth daily. womens multivitiamin gummies 2 a day   Multiple Vitamins-Minerals (MEGAVITE FRUITS & VEGGIES PO) Take by mouth. Balance nature fruits and veggies   omeprazole (PRILOSEC) 20 MG capsule TAKE 1 CAPSULE(20 MG) BY MOUTH DAILY   sacubitril-valsartan (ENTRESTO) 97-103 MG Take 1 tablet by mouth 2 (two) times daily.   spironolactone (ALDACTONE) 25 MG tablet Take 1 tablet (25 mg total) by mouth daily.   albuterol (VENTOLIN HFA) 108 (90 Base) MCG/ACT inhaler Inhale 2 puffs into the lungs every 6 (six) hours as needed for wheezing or shortness of breath. (Patient not taking: Reported on 09/02/2022)   No facility-administered encounter medications on file as of 09/02/2022.    Allergies (verified) Coumadin [warfarin sodium], Methocarbamol, and Norco [hydrocodone-acetaminophen]   History: Past Medical History:  Diagnosis Date   Arthritis    Bronchitis    hx of   GERD (gastroesophageal reflux disease)    History of bleeding ulcers    History of gastrointestinal hemorrhage    now off aspirin   Hyperlipidemia    Hypertension    Osteoarthritis of right knee 06/03/2012   Past Surgical History:  Procedure Laterality Date   ABDOMINAL HYSTERECTOMY     nonmalignant reasons   APPENDECTOMY     RIGHT/LEFT HEART CATH AND CORONARY  ANGIOGRAPHY N/A 02/25/2022   Procedure: RIGHT/LEFT HEART CATH AND CORONARY ANGIOGRAPHY;  Surgeon: Kathleene Hazel, MD;  Location: [facility];  Service: Cardiovascular;  Laterality: N/A;   TOTAL KNEE ARTHROPLASTY  05/30/2012   Procedure: TOTAL KNEE ARTHROPLASTY;  Surgeon: Loreta Ave, MD;  Location: [facility] OR;  Service: Orthopedics;  Laterality: Right;  DR MURPHY WANTS 90 MINUTES FOR THIS CASE. 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   Procedure: LEFT TOTAL KNEE ARTHROPLASTY;  Surgeon: Loreta Ave, MD;  Location: [facility] OR;  Service: Orthopedics;  Laterality: Left;   Family History  Problem Relation Age of Onset    Congestive Heart Failure Mother        65, only child   Other Father        was not in patient life   Social History   Socioeconomic History   Marital status: Divorced    Spouse name: Not on file   Number of children: Not on file   Years of education: Not on file   Highest education level: Not on file  Occupational History   Occupation: day care   Tobacco Use   Smoking status: Never   Smokeless tobacco: Never  Substance and Sexual Activity   Alcohol use: No   Drug use: No   Sexual activity: Not on file  Other Topics Concern   Not on file  Social History Narrative   Lives alone. Great granddaughter stays with her on weekends.    Divorced 73.       Still doing childcare   Retired from [employer]   Social Determinants of [employer]: Low Risk  (09/02/2022)   Overall Financial Resource Strain (CARDIA)    Difficulty of Paying Living Expenses: Not hard at all  Food Insecurity: No Food Insecurity (09/02/2022)   Hunger Vital Sign    Worried About Running Out of Food in the Last Year: Never true    Ran Out of Food in the Last Year: Never true  Transportation Needs: No Transportation Needs (09/02/2022)   PRAPARE - Administrator, Civil Service (Medical): No    Lack of Transportation (Non-Medical): No  Physical Activity: Sufficiently Active (09/02/2022)   Exercise Vital Sign    Days of Exercise per Week: 5 days    Minutes of Exercise per Session: 60 min  Stress: No Stress Concern Present (09/02/2022)   [employer] of Occupational Health - Occupational Stress Questionnaire    Feeling of Stress : Not at all  Social Connections: Moderately Isolated (09/02/2022)   Social Connection and Isolation Panel [NHANES]    Frequency of Communication with Friends and Family: More than three times a week    Frequency of Social Gatherings with Friends and Family: More than three times a week    Attends Religious Services: More than 4 times per  year    Active Member of [employer] or Organizations: No    Attends Engineer, structural: Never    Marital Status: Divorced    Tobacco Counseling Counseling given: Not Answered   Clinical Intake:  Pre-visit preparation completed: Yes  Pain : No/denies pain     BMI - recorded: 26.04 Nutritional Status: BMI 25 -29 Overweight Nutritional Risks: None Diabetes: No  How often do you need to have someone help you when you read instructions, pamphlets, or other written materials from your doctor or pharmacy?: 1 - Never  Diabetic?no  Interpreter Needed?: No  Information entered  by :: Lanier Ensign, LPN   Activities of Daily Living    09/02/2022    8:51 AM  In your present state of health, do you have any difficulty performing the following activities:  Hearing? 0  Vision? 0  Difficulty concentrating or making decisions? 0  Walking or climbing stairs? 0  Dressing or bathing? 0  Doing errands, shopping? 0  Preparing Food and eating ? N  Using the Toilet? N  In the past six months, have you accidently leaked urine? N  Do you have problems with loss of bowel control? N  Managing your Medications? N  Managing your Finances? N  Housekeeping or managing your Housekeeping? N    Patient Care Team: Shelva Majestic, MD as PCP - General (Family Medicine) Christell Constant, MD as PCP - Cardiology (Cardiology) Augustin Schooling, MD as Consulting Physician (Ophthalmology) Sheral Apley, MD as Consulting Physician (Orthopedic Surgery) Dahlia Byes, [facility] (Inactive) as Pharmacist (Pharmacist)  Indicate any recent Medical Services you may have received from other than [facility] providers in the past year (date may be approximate).     Assessment:   This is a routine wellness examination for Maria [name].  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), Time (Minutes): 60, Frequency (Times/Week): 5, Weekly Exercise (Minutes/Week): 300   Goals Addressed             This Visit's Progress    Patient Stated       Keep weight down and continue maintaining health        Depression Screen    09/02/2022    8:47 AM 12/17/2021    9:16 AM 08/23/2021    8:12 AM 06/04/2021    8:51 AM 11/11/2020    8:50 AM 07/20/2020    2:47 PM 02/13/2020    9:23 AM  PHQ 2/9 Scores  PHQ - 2 Score 0 0 0 0 0 0 0  PHQ- 9 Score  0         Fall Risk    09/02/2022    8:50 AM 08/23/2021    8:15 AM 06/04/2021    8:51 AM 07/20/2020    3:00 PM 07/20/2020    2:59 PM  Fall Risk   Falls in the past year? 0 0 0  0  Number falls in past yr: 0 0 0  0  Injury with Fall? 0 0 0  0  Risk for fall due to :   No Fall Risks    Follow up Falls prevention discussed  Falls evaluation completed Falls prevention discussed Falls prevention discussed    FALL RISK PREVENTION PERTAINING TO THE HOME:  Any stairs in or around the home? No  If so, are there any without handrails? No  Home free of loose throw rugs in walkways, pet beds, electrical cords, etc? Yes  Adequate lighting in your home to reduce risk of falls? Yes   ASSISTIVE DEVICES UTILIZED TO PREVENT FALLS:  Life alert? No  Use of a cane, walker or w/c? No  Grab bars in the bathroom? No  Shower chair or bench in shower? No  Elevated toilet seat or a handicapped toilet? No   TIMED UP AND GO:  Was the test  performed? Yes .  Length of time to ambulate 10 feet: 15 sec.   Gait steady and fast without use of assistive device  Cognitive Function:        08/23/2021    8:18 AM 07/17/2019    8:51 AM 04/26/2018    9:34 AM  6CIT Screen  What Year? 0 points 0 points 0 points  What month? 0 points 0 points 0 points  What time? 0 points 0 points 0 points   Count back from 20 0 points 0 points 0 points  Months in reverse 0 points 0 points 0 points  Repeat phrase 0 points 0 points 0 points  Total Score 0 points 0 points 0 points    Immunizations Immunization History  Administered Date(s) Administered   Fluad Quad(high Dose 65+) 10/03/2019, 09/23/2020   Influenza Split 09/08/2011, 10/29/2012   Influenza Whole 08/21/2008, 09/06/2010   Influenza, High Dose Seasonal PF 09/29/2016, 08/23/2018   Influenza,inj,Quad PF,6+ Mos 11/07/2013   PFIZER(Purple Top)SARS-COV-2 Vaccination 02/01/2020, 02/08/2020, 09/08/2020, 06/01/2021   PPD Test 03/24/2017, 03/25/2020   Pneumococcal Conjugate-13 11/07/2013   Pneumococcal Polysaccharide-23 12/05/2005, 09/08/2011   Td 12/05/2001   Tdap 10/29/2012   Zoster, Live 08/21/2008    TDAP status: Up to date  Flu Vaccine status: Due, Education has been provided regarding the importance of this vaccine. Advised may receive this vaccine at local pharmacy or Health Dept. Aware to provide a copy of the vaccination record if obtained from local pharmacy or Health Dept. Verbalized acceptance and understanding.  Pneumococcal vaccine status: Due, Education has been provided regarding the importance of this vaccine. Advised may receive this vaccine at local pharmacy or Health Dept. Aware to provide a copy of the vaccination record if obtained from local pharmacy or Health Dept. Verbalized acceptance and understanding.  Covid-19 vaccine status: Completed vaccines  Qualifies for Shingles Vaccine? Yes   Zostavax completed No   Shingrix Completed?: No.    Education has been provided regarding the importance of this vaccine. Patient has been advised to call insurance company to determine out of pocket expense if they have not yet received this vaccine. Advised may also receive vaccine at local pharmacy or Health Dept. Verbalized acceptance and understanding.  Screening Tests Health Maintenance  Topic Date Due   Zoster  Vaccines- Shingrix (1 of 2) Never done   COVID-19 Vaccine (5 - Pfizer risk series) 07/27/2021   INFLUENZA VACCINE  07/05/2022   TETANUS/TDAP  10/29/2022   MAMMOGRAM  11/10/2022   Pneumonia Vaccine 63+ Years old  Completed   DEXA SCAN  Completed   HPV VACCINES  Aged Out    Health Maintenance  Health Maintenance Due  Topic Date Due   Zoster Vaccines- Shingrix (1 of 2) Never done   COVID-19 Vaccine (5 - Pfizer risk series) 07/27/2021   INFLUENZA VACCINE  07/05/2022    Colorectal cancer screening: No longer required.   Mammogram status: Completed 11/10/21. Repeat every year  Bone Density status: Completed 02/09/22. Results reflect: Bone density results: NORMAL. Repeat every 2 years.   Additional Screening:   Vision Screening: Recommended annual ophthalmology exams for early detection of glaucoma and other disorders of the eye. Is the patient up to date with their annual eye exam?  Yes  Who is the provider or what is the name of the office in which the patient attends annual eye exams? Dr Nancy Fetter  If pt is not established with a provider, would they like to be referred to a provider to establish  care? No .   Dental Screening: Recommended annual dental exams for proper oral hygiene  Community Resource Referral / Chronic Care Management: CRR required this visit?  No   CCM required this visit?  No      Plan:     I have personally reviewed and noted the following in the patient's chart:   Medical and social history Use of alcohol, tobacco or illicit drugs  Current medications and supplements including opioid prescriptions. Patient is not currently taking opioid prescriptions. Functional ability and status Nutritional status Physical activity Advanced directives List of other physicians Hospitalizations, surgeries, and ER visits in previous 12 months Vitals Screenings to include cognitive, depression, and falls Referrals and appointments  In addition, I have reviewed and  discussed with patient certain preventive protocols, quality metrics, and best practice recommendations. A written personalized care plan for preventive services as well as general preventive health recommendations were provided to patient.     Marzella Schlein, LPN   [id number]   Nurse Notes: none

## 2022-09-08 ENCOUNTER — Ambulatory Visit: Payer: Medicare Other

## 2022-09-08 ENCOUNTER — Ambulatory Visit (INDEPENDENT_AMBULATORY_CARE_PROVIDER_SITE_OTHER): Payer: Medicare Other | Admitting: Family Medicine

## 2022-09-08 ENCOUNTER — Encounter: Payer: Self-pay | Admitting: Family Medicine

## 2022-09-08 ENCOUNTER — Ambulatory Visit: Payer: Medicare Other | Attending: Internal Medicine

## 2022-09-08 VITALS — BP 120/72 | HR 78 | Temp 97.9°F | Ht 61.0 in | Wt 136.8 lb

## 2022-09-08 DIAGNOSIS — N183 Chronic kidney disease, stage 3 unspecified: Secondary | ICD-10-CM | POA: Diagnosis not present

## 2022-09-08 DIAGNOSIS — I5022 Chronic systolic (congestive) heart failure: Secondary | ICD-10-CM

## 2022-09-08 DIAGNOSIS — I5020 Unspecified systolic (congestive) heart failure: Secondary | ICD-10-CM | POA: Diagnosis not present

## 2022-09-08 DIAGNOSIS — Z23 Encounter for immunization: Secondary | ICD-10-CM

## 2022-09-08 DIAGNOSIS — I493 Ventricular premature depolarization: Secondary | ICD-10-CM

## 2022-09-08 DIAGNOSIS — I10 Essential (primary) hypertension: Secondary | ICD-10-CM | POA: Diagnosis not present

## 2022-09-08 DIAGNOSIS — E785 Hyperlipidemia, unspecified: Secondary | ICD-10-CM | POA: Diagnosis not present

## 2022-09-08 DIAGNOSIS — R21 Rash and other nonspecific skin eruption: Secondary | ICD-10-CM | POA: Diagnosis not present

## 2022-09-08 LAB — ECHOCARDIOGRAM COMPLETE
Area-P 1/2: 3.37 cm2
S' Lateral: 3.1 cm

## 2022-09-08 LAB — BASIC METABOLIC PANEL
BUN/Creatinine Ratio: 20 (ref 12–28)
BUN: 34 mg/dL — ABNORMAL HIGH (ref 8–27)
CO2: 28 mmol/L (ref 20–29)
Calcium: 9.9 mg/dL (ref 8.7–10.3)
Chloride: 106 mmol/L (ref 96–106)
Creatinine, Ser: 1.74 mg/dL — ABNORMAL HIGH (ref 0.57–1.00)
Glucose: 90 mg/dL (ref 70–99)
Potassium: 4.5 mmol/L (ref 3.5–5.2)
Sodium: 141 mmol/L (ref 134–144)
eGFR: 29 mL/min/{1.73_m2} — ABNORMAL LOW (ref >59)

## 2022-09-08 MED ORDER — TRIAMCINOLONE ACETONIDE 0.1 % EX CREA: 1.0000 | TOPICAL_CREAM | Freq: Two times a day (BID) | CUTANEOUS | 0 refills | Status: DC

## 2022-09-08 NOTE — Patient Instructions (Addendum)
Glad rash is improving. Not clear what triggered this- perhaps something you ate or came in contact with? - trial cream if needed for itch or rash -if worsens/recurs see [place] back  Recommended follow up: keep January 15th appointment but you can cancel the 09/26/22 appintment on your way out

## 2022-09-08 NOTE — Progress Notes (Signed)
Phone [phone] In person visit   Subjective:   Maria [name] is a 81 y.o. year old very pleasant female patient who presents for/with See problem oriented charting Chief Complaint  Patient presents with   rash on arms and back    Pt to discuss rash on arms and back,   Past Medical History-  Patient Active Problem List   Diagnosis Date Noted   Chronic systolic CHF (congestive heart failure), NYHA class 3 ([place])     Priority: High   NICM (nonischemic cardiomyopathy) ([place])     Priority: High   History of gastrointestinal hemorrhage     Priority: High   Fatty liver 01/07/2022    Priority: Medium    Microalbuminuria 12/17/2020    Priority: Medium    Aortic atherosclerosis ([place]) 06/18/2020    Priority: Medium    Hyperglycemia 04/16/2020    Priority: Medium    Chronic kidney disease (CKD), stage III (moderate) ([place]) 10/04/2019    Priority: Medium    GERD (gastroesophageal reflux disease) 05/27/2014    Priority: Medium    Hyperlipidemia LDL goal <100 08/21/2008    Priority: Medium    Essential hypertension 08/21/2008    Priority: Medium    PVC (premature ventricular contraction) 02/14/2022    Priority: Low   Arthritis of right hip 10/10/2019    Priority: Low   Bursitis of right hip 10/10/2019    Priority: Low   Allergic rhinitis 10/03/2019    Priority: Low   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/08/2016    Priority: Low   Osteoarthritis of right knee 06/03/2012    Priority: Low    Medications- reviewed and updated Current Outpatient Medications  Medication Sig Dispense Refill   atorvastatin (LIPITOR) 40 MG tablet TAKE 1 TABLET(40 MG) BY MOUTH DAILY 90 tablet 1   Calcium Carbonate-Vitamin D (CALCIUM PLUS VITAMIN D PO) Take 2 tablets by mouth daily.     cyanocobalamin 100 MCG tablet Take 100 mcg by mouth daily.     dapagliflozin propanediol (FARXIGA) 10 MG TABS tablet Take 10 mg by mouth daily.     furosemide (LASIX) 20 MG tablet Take 1 tablet (20 mg total) by mouth as  needed (swelling). 90 tablet 3   metFORMIN (GLUCOPHAGE) 500 MG tablet TAKE 1 TABLET(500 MG) BY MOUTH DAILY WITH BREAKFAST 30 tablet 5   metoprolol (TOPROL XL) 200 MG 24 hr tablet Take 1 tablet (200 mg total) by mouth daily. 90 tablet 3   Multiple Vitamin (MULTI-DAY PO) Take 2 tablets by mouth daily. womens multivitiamin gummies 2 a day     Multiple Vitamins-Minerals (MEGAVITE FRUITS & VEGGIES PO) Take by mouth. Balance nature fruits and veggies     omeprazole (PRILOSEC) 20 MG capsule TAKE 1 CAPSULE(20 MG) BY MOUTH DAILY 90 capsule 1   sacubitril-valsartan (ENTRESTO) 97-103 MG Take 1 tablet by mouth 2 (two) times daily. 180 tablet 3   spironolactone (ALDACTONE) 25 MG tablet Take 1 tablet (25 mg total) by mouth daily. 90 tablet 3   triamcinolone cream (KENALOG) 0.1 % Apply 1 Application topically 2 (two) times daily. For 7-10 days maximum 80 g 0   No current facility-administered medications for this visit.     Objective:  BP 120/72   Pulse 78   Temp 97.9 F (36.6 C)   Ht 5\' 1"  (1.549 m)   Wt 136 lb 12.8 oz (62.1 kg)   SpO2 96%   BMI 25.85 kg/m  Gen: NAD, resting comfortably CV: RRR no murmurs rubs  or gallops Lungs: CTAB no crackles, wheeze, rhonchi Ext: no edema Skin: warm, dry     Assessment and Plan   #social update- going to a conference with [employer]- leaves at 2 pm  #Rash S:noted some whelps/possible hives on upper back and arms starting a week ago. Was very itchy and has several areas of excoriation.  - alcohol and vaseline  ROS-not ill appearing, no fever/chills. No new medications- other than increased entresto. Not immunocompromised. No mucus membrane involvement.  A/P:Glad rash is improving. Not clear what triggered this- perhaps something you ate or came in contact with? - trial triamcinolone cream if needed for itch or rash -if worsens/recurs see [place] back -Does have prediabetes would avoid prednisone unless absolutely necessary-likely not needed with  improvement   # CHF- Dr. Gasper Sells cardiology- diagnosed 2023 S:Medication: farxiga 10 mg, lasix 20 mg as needed listed but she has used daily entresto  97-103 (currently taking 2 of 49-51 mg), spironolactone 25 mg, metoprolol 200 mg XR. Farxiga  10 mg  - has had  cath 02/25/22 with no evidence of CAD- remain off aspirin A/P: CHF appears well controlled/euvolemic-continue current medications - She really loves shrimp and has been cautious due to salt intake-on her weekend trip I told her to continue Lasix daily through the weekend and enjoy herself some shrimp! - We discussed after the weekend changing to using Lasix only if weight increases 3 pounds within a day or 2 or 5 pounds within 3 days  #Essential hypertension S: Compliant with lasix 20 mg daily, metoprolol 200 mg XR, entresto 49-51 mg, spironolactone 25 mg A/P: Excellent control-continue current medication   Recommended follow up: keep January 15th appointment but you can cancel the 09/26/22 appintment on your way out  Future Appointments  Date Time Provider [place]  12/08/2022  8:40 AM Werner Lean, MD [facility]  12/19/2022 10:00 AM Marin Olp, MD [facility]  09/08/2023  9:00 AM [facility] CO[facility]   Lab/Order associations:   ICD-10-CM   1. Rash  R21     2. Chronic systolic CHF (congestive heart failure), NYHA class 3 (H[facility])  I50.22     3. Essential hypertension  I10     4. Need for immunization against influenza  Z23 Flu Vaccine QUAD High Dose(Fluad)      Meds ordered this encounter  Medications   triamcinolone cream (KENALOG) 0.1 %    Sig: Apply 1 Application topically 2 (two) times daily. For 7-10 days maximum    Dispense:  80 g    Refill:  0    Return precautions advised.  Garret Reddish, MD

## 2022-09-15 ENCOUNTER — Telehealth: Payer: Self-pay | Admitting: Pharmacist

## 2022-09-15 NOTE — Telephone Encounter (Signed)
Patient re-enrolled for pt assistance for Fargixa for 2024

## 2022-09-26 ENCOUNTER — Ambulatory Visit: Payer: Medicare Other | Admitting: Family Medicine

## 2022-09-30 ENCOUNTER — Telehealth: Payer: Self-pay | Admitting: Internal Medicine

## 2022-09-30 NOTE — Telephone Encounter (Signed)
Returned pt's call.  She is still waiting for her supply from the Patient Assistance program for [facility]. We are out of the 97/103's so she was given 2 bottles of the 49/51 and is aware to take 2 tablets twice a day.  Pt aware I will make the Pharmacist that handled this, and will also send to [facility], LPN so someone can call and check on her supply.

## 2022-09-30 NOTE — Telephone Encounter (Signed)
Patient calling the office for samples of medication:   1.  What medication and dosage are you requesting samples for?   sacubitril-valsartan (ENTRESTO) 97-103 MG    2.  Are you currently out of this medication? Yes   

## 2022-09-30 NOTE — Telephone Encounter (Signed)
**Note De-Identified Maria [name] Obfuscation** I called [employer] and was advised by Janett Billow that the pt has not called them to request her next shipment of Entresto and that her last order was requested by the providers office in May. I did requested that they fill and ship the pts Entresto 97-103 mg #180  to her home address.  Per Janett Billow, the pts [name] will arrive at her home on 10/31 and that if not received to have pt contact them.  I called the pt and advised her that I have requested her Entresto refill/shipment from [employer] and I provided her their phone number, [phone] and asked her to call them when she gets down to a 7 to 10 day supply of Entresto on hand so she will not run out.  She verbalized understanding to all information given and thanked me for our assistance. She is aware to call us back if she has any questions or concerns about this.

## 2022-11-14 ENCOUNTER — Telehealth: Payer: Self-pay | Admitting: Internal Medicine

## 2022-11-14 NOTE — Telephone Encounter (Signed)
Pt c/o medication issue:  1. Name of Medication: metoprolol 100 mg  2. How are you currently taking this medication (dosage and times per day)? Took last tablet of 200 MG tablet today   3. Are you having a reaction (difficulty breathing--STAT)? No   4. What is your medication issue? Patient is calling wanting to confirm that it is okay for her to take two tablet of her 100 MG tablets until they are gone due to taking last 200 MG tablet today. Please advise.

## 2022-11-14 NOTE — Telephone Encounter (Signed)
Called pt advised it is okay to take x2 metoprolol succinate 100 mg PO QD in place of metoprolol 200 mg PO QD.  Pt will call pharmacy for 200 mg refill once 100 mg tablets are gone. Pt had no further questions.

## 2022-11-16 DIAGNOSIS — Z1231 Encounter for screening mammogram for malignant neoplasm of breast: Secondary | ICD-10-CM | POA: Diagnosis not present

## 2022-11-16 LAB — HM MAMMOGRAPHY

## 2022-11-28 ENCOUNTER — Other Ambulatory Visit: Payer: Self-pay | Admitting: Family Medicine

## 2022-12-02 ENCOUNTER — Telehealth: Payer: Self-pay | Admitting: Family Medicine

## 2022-12-02 MED ORDER — METFORMIN HCL 500 MG PO TABS: ORAL_TABLET | ORAL | 5 refills | Status: DC

## 2022-12-02 NOTE — Telephone Encounter (Signed)
   LAST APPOINTMENT DATE:  09/08/22  NEXT APPOINTMENT DATE: 12/19/22  MEDICATION:  metFORMIN (GLUCOPHAGE) 500 MG tablet   Is the patient out of medication? Yes  PHARMACY:  [facility] [phone] - [place],  - [address] Phone: [phone]  Fax: [phone]     Let patient know to contact pharmacy at the end of the day to make sure medication is ready.  Please notify patient to allow 48-72 hours to process

## 2022-12-02 NOTE — Telephone Encounter (Signed)
Rx sent to pharmacy   

## 2022-12-07 NOTE — Progress Notes (Signed)
Cardiology Office Note:    Date:  12/08/2022   ID:  Maria [name], DOB March 02, 1941, MRN [id number]  PCP:  Shelva Majestic, MD   [address] [facility] Providers Cardiologist:  Christell Constant, MD     Referring MD: Shelva Majestic, MD   CC: Heart failure follow up  History of Present Illness:    Maria [name] is a 82 y.o. female with a hx of HTN, aortic atherosclerosis and HLD, CKD stage IIIa. 2022: Found to have LVEF 25%.   Started on [place], no CAD on 02/25/22 cath. RA 12, PCWP 19.  Increase Entresto.  Sent to Pharm D clinic for financial assistance.  After that lost to follow up. 2023: started on GDMT with improvement in LVEF ~ 40%  Patient notes that she is doing great.   Since last visit notes that she felt fantastic- she has not gained weight, her blood pressure has done great and she had no issues despite eating for  Thanksgiving, Christmas, and the New Years. There are no interval hospital/ED visit.    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  Went back yesterday.    Has had some coughing lately and feeds a cold was coming on.  It improved her symptoms.  No chest pain or pressure .  No SOB/DOE and no PND/Orthopnea.  No weight gain or leg swelling.  No palpitations or syncope .   Past Medical History:  Diagnosis Date   Arthritis    Bronchitis    hx of   GERD (gastroesophageal reflux disease)    History of bleeding ulcers    History of gastrointestinal hemorrhage    now off aspirin   Hyperlipidemia    Hypertension    Osteoarthritis of right knee 06/03/2012    Past Surgical History:  Procedure Laterality Date   ABDOMINAL HYSTERECTOMY     nonmalignant reasons   APPENDECTOMY     RIGHT/LEFT HEART CATH AND CORONARY ANGIOGRAPHY N/A 02/25/2022   Procedure: RIGHT/LEFT HEART CATH AND CORONARY ANGIOGRAPHY;  Surgeon: Kathleene Hazel, MD;  Location: [facility] INV[facility]IVE [facility];  Service: Cardiovascular;  Laterality: N/A;   TOTAL KNEE ARTHROPLASTY   05/30/2012   Procedure: TOTAL KNEE ARTHROPLASTY;  Surgeon: Loreta Ave, MD;  Location: [facility] OR;  Service: Orthopedics;  Laterality: Right;  DR MURPHY WANTS 90 MINUTES FOR THIS CASE. 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   Procedure: LEFT TOTAL KNEE ARTHROPLASTY;  Surgeon: Loreta Ave, MD;  Location[facility] OR;  Service: Orthopedics;  Laterality: Left;    Current Medications: Current Meds  Medication Sig   atorvastatin (LIPITOR) 40 MG tablet TAKE 1 TABLET(40 MG) BY MOUTH DAILY   Calcium Carbonate-Vitamin D (CALCIUM PLUS VITAMIN D PO) Take 2 tablets by mouth daily.   cyanocobalamin 100 MCG tablet Take 100 mcg by mouth daily.   dapagliflozin propanediol (FARXIGA) 10 MG TABS tablet Take 10 mg by mouth daily.   furosemide (LASIX) 20 MG tablet Take 1 tablet (20 mg total) by mouth as needed (swelling).   metFORMIN (GLUCOPHAGE) 500 MG tablet TAKE 1 TABLET(500 MG) BY MOUTH DAILY WITH BREAKFAST   metoprolol (TOPROL XL) 200 MG 24 hr tablet Take 1 tablet (200 mg total) by mouth daily.   Multiple Vitamin (MULTI-DAY PO) Take 2 tablets by mouth daily. womens multivitiamin gummies 2 a day   Multiple Vitamins-Minerals (MEGAVITE FRUITS & VEGGIES PO) Take by mouth. Balance nature fruits and veggies   omeprazole (PRILOSEC) 20  MG capsule TAKE 1 CAPSULE(20 MG) BY MOUTH DAILY   sacubitril-valsartan (ENTRESTO) 97-103 MG Take 1 tablet by mouth 2 (two) times daily.   spironolactone (ALDACTONE) 25 MG tablet Take 1 tablet (25 mg total) by mouth daily.   triamcinolone cream (KENALOG) 0.1 % Apply 1 Application topically 2 (two) times daily. For 7-10 days maximum     Allergies:   Coumadin [warfarin sodium], Methocarbamol, and Norco [hydrocodone-acetaminophen]   Social History   Socioeconomic History   Marital status: Divorced    Spouse name: Not on file   Number of children: Not on file   Years of education: Not on file   Highest education level: Not on file  Occupational History   Occupation: day  care   Tobacco Use   Smoking status: Never   Smokeless tobacco: Never  Substance and Sexual Activity   Alcohol use: No   Drug use: No   Sexual activity: Not on file  Other Topics Concern   Not on file  Social History Narrative   Lives alone. Great granddaughter stays with her on weekends.    Divorced 25.       Still doing childcare   Retired from [employer]   Social Determinants of [employer]: Low Risk  (09/02/2022)   Overall Financial Resource Strain (CARDIA)    Difficulty of Paying Living Expenses: Not hard at all  Food Insecurity: No Food Insecurity (09/02/2022)   Hunger Vital Sign    Worried About Running Out of Food in the Last Year: Never true    Ran Out of Food in the Last Year: Never true  Transportation Needs: No Transportation Needs (09/02/2022)   PRAPARE - Administrator, Civil Service (Medical): No    Lack of Transportation (Non-Medical): No  Physical Activity: Sufficiently Active (09/02/2022)   Exercise Vital Sign    Days of Exercise per Week: 5 days    Minutes of Exercise per Session: 60 min  Stress: No Stress Concern Present (09/02/2022)   [employer] of Occupational Health - Occupational Stress Questionnaire    Feeling of Stress : Not at all  Social Connections: Moderately Isolated (09/02/2022)   Social Connection and Isolation Panel [NHANES]    Frequency of Communication with Friends and Family: More than three times a week    Frequency of Social Gatherings with Friends and Family: More than three times a week    Attends Religious Services: More than 4 times per year    Active Member of [employer] or Organizations: No    Attends Banker Meetings: Never    Marital Status: Divorced    Social: Very Religious, has son; they go on vacation to [place]  every year  Family History: The patient's family history includes Congestive Heart Failure in her mother; Other in her father.  ROS:   Please see the  history of present illness.     All other systems reviewed and are negative.  EKGs/Labs/Other Studies Reviewed:    The following studies were reviewed today:  EKG:   02/14/22: Sinus tachycardia with frequent PVCs  Cardiac Studies & Procedures   CARDIAC CATHETERIZATION  CARDIAC CATHETERIZATION 02/25/2022  Narrative No angiographic evidence of CAD  RA 12 RV 57/0/14 PA 62/24 (mean 40) PCWP 19 LV 131/16/18 AO 119/59  Recommendations: Continue medical management of non-ischemic cardiomyopathy  Findings Coronary Findings Diagnostic  Dominance: Right  Left Anterior Descending Vessel is large.  Left Circumflex Vessel is large.  Right  Coronary Artery Vessel is large.  Intervention  No interventions have been documented.     ECHOCARDIOGRAM  ECHOCARDIOGRAM COMPLETE 09/08/2022  Narrative ECHOCARDIOGRAM REPORT    Patient Name:   Maria [name] [facility] Date of Exam: 09/08/2022 Medical Rec #:  [id number]        Height:       61.0 in Accession #:    [id number]       Weight:       137.8 lb Date of Birth:  02/17/1941         BSA:          1.612 m Patient Age:    60 years         BP:           120/68 mmHg Patient Gender: F                HR:           71 bpm. Exam Location:  [address]  Procedure: 2D Echo, 3D Echo, Cardiac Doppler, Color Doppler and Strain Analysis  Indications:    I50.9 CHF  History:        Patient has prior history of Echocardiogram examinations, most recent 02/17/2022. Risk Factors:Hypertension and Dyslipidemia.  Sonographer:    Cresenciano Lick RDCS Referring Phys: [id number] [facility] A Rosine Solecki  IMPRESSIONS   1. Left ventricular ejection fraction, by estimation, is 40 to 45%. The left ventricle has mildly decreased function. The left ventricle demonstrates regional wall motion abnormalities (see scoring diagram/findings for description). The mid-to-apical inferoseptal, mid-to-apical anteroseptal, and apical inferior LV segments appear  severely hypokinetic. Left ventricular diastolic parameters are consistent with Grade I diastolic dysfunction (impaired relaxation). Overall, EF and wall motion significantly improved compared to prior TTE on 02/2022. 2. Right ventricular systolic function is mildly reduced. The right ventricular size is normal. Tricuspid regurgitation signal is inadequate for assessing PA pressure. 3. Left atrial size was mildly dilated. 4. The mitral valve is degenerative. Mild mitral valve regurgitation. 5. The aortic valve is tricuspid. There is mild calcification of the aortic valve. There is mild thickening of the aortic valve. Aortic valve regurgitation is not visualized. Aortic valve sclerosis/calcification is present, without any evidence of aortic stenosis. 6. The inferior vena cava is normal in size with greater than 50% respiratory variability, suggesting right atrial pressure of 3 mmHg.  Comparison(s): Compared to prior TTE on 02/2022, the EF has improved to 40-45% with improved wall motion, the MR has decreased to mild (previously moderate to severe), the TR has decreased to trivial (previously moderate).  FINDINGS Left Ventricle: The mid-to-apical inferoseptal, mid-to-apical anteroseptal, and apical inferior LV segments appear severely hypokinetic. Left ventricular ejection fraction, by estimation, is 40 to 45%. The left ventricle has mildly decreased function. The left ventricle demonstrates regional wall motion abnormalities. Global longitudinal strain performed but not reported based on interpreter judgement due to suboptimal tracking. 3D left ventricular ejection fraction analysis performed but not reported based on interpreter judgement due to suboptimal tracking. The left ventricular internal cavity size was normal in size. There is no left ventricular hypertrophy. Left ventricular diastolic parameters are consistent with Grade I diastolic dysfunction (impaired relaxation).  Right Ventricle: The  right ventricular size is normal. No increase in right ventricular wall thickness. Right ventricular systolic function is mildly reduced. Tricuspid regurgitation signal is inadequate for assessing PA pressure.  Left Atrium: Left atrial size was mildly dilated.  Right Atrium: Right atrial size was normal in size.  Pericardium: There is  no evidence of pericardial effusion.  Mitral Valve: The mitral valve is degenerative in appearance. There is mild thickening of the mitral valve leaflet(s). There is mild calcification of the mitral valve leaflet(s). Mild to moderate mitral annular calcification. Mild mitral valve regurgitation.  Tricuspid Valve: The tricuspid valve is normal in structure. Tricuspid valve regurgitation is trivial.  Aortic Valve: The aortic valve is tricuspid. There is mild calcification of the aortic valve. There is mild thickening of the aortic valve. Aortic valve regurgitation is not visualized. Aortic valve sclerosis/calcification is present, without any evidence of aortic stenosis.  Pulmonic Valve: The pulmonic valve was normal in structure. Pulmonic valve regurgitation is trivial.  Aorta: The aortic root and ascending aorta are structurally normal, with no evidence of dilitation.  Venous: The inferior vena cava is normal in size with greater than 50% respiratory variability, suggesting right atrial pressure of 3 mmHg.  IAS/Shunts: The atrial septum is grossly normal.   LEFT VENTRICLE PLAX 2D LVIDd:         4.50 cm   Diastology LVIDs:         3.10 cm   LV e' medial:    5.66 cm/s LV PW:         0.90 cm   LV E/e' medial:  10.8 LV IVS:        0.90 cm   LV e' lateral:   8.81 cm/s LVOT diam:     1.60 cm   LV E/e' lateral: 7.0 LV SV:         35 LV SV Index:   22        2D Longitudinal Strain LVOT Area:     2.01 cm  2D Strain GLS (A2C):   -20.3 % 2D Strain GLS (A3C):   -20.3 % 2D Strain GLS (A4C):   -21.2 % 2D Strain GLS Avg:     -20.6 %  3D Volume EF: 3D EF:         55 % LV EDV:       128 ml LV ESV:       57 ml LV SV:        71 ml  RIGHT VENTRICLE            IVC RV Basal diam:  3.10 cm    IVC diam: 1.50 cm RV S prime:     8.70 cm/s TAPSE (M-mode): 1.0 cm  LEFT ATRIUM             Index        RIGHT ATRIUM           Index LA diam:        4.10 cm 2.54 cm/m   RA Area:     10.70 cm LA Vol (A2C):   46.7 ml 28.97 ml/m  RA Volume:   24.50 ml  15.20 ml/m LA Vol (A4C):   36.2 ml 22.45 ml/m LA Biplane Vol: 42.1 ml 26.11 ml/m AORTIC VALVE LVOT Vmax:   77.80 cm/s LVOT Vmean:  51.350 cm/s LVOT VTI:    0.172 m  AORTA Ao Root diam: 2.50 cm Ao Asc diam:  2.80 cm  MITRAL VALVE MV Area (PHT): 3.37 cm    SHUNTS MV Decel Time: 225 msec    Systemic VTI:  0.17 m MV E velocity: 61.30 cm/s  Systemic Diam: 1.60 cm MV A velocity: 90.70 cm/s MV E/A ratio:  0.68  Laurance Flatten MD Electronically signed by Laurance Flatten MD Signature Date/Time: 09/08/2022/2:23:08 PM  Final    MONITORS  LONG TERM MONITOR (3-14 DAYS) 04/06/2022  Narrative  Patient had a minimum heart rate of 71 bpm, maximum heart rate of 240 bpm (NSVT) , and average heart rate of 97 bpm.  Predominant underlying rhythm was sinus rhythm.  Many runs of NSVT lasting 21 beats beats at longest. Some reported NSVT runs are artifact.  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  Isolated PACs were occasional (4.5%).  Isolated PVCs were occasional (4.5%).  No evidence of significant heart block .  No triggered and diary events.  Asymptomatic SVT and NSVT.               Recent Labs: 01/07/2022: Brain Natriuretic Peptide CANCELED 02/08/2022: TSH 0.91 06/29/2022: Hemoglobin 13.7; Platelets 154.0 08/23/2022: ALT 8; NT-Pro BNP 3,640 09/08/2022: BUN 34; Creatinine, Ser 1.74; Potassium 4.5; Sodium 141  Recent Lipid Panel    Component Value Date/Time   CHOL 172 08/23/2022 1117   TRIG 46 08/23/2022 1117    HDL 57 08/23/2022 1117   CHOLHDL 3.0 08/23/2022 1117   CHOLHDL 3 12/17/2021 1033   VLDL 10.4 12/17/2021 1033   LDLCALC 106 (H) 08/23/2022 1117   LDLCALC 89 11/11/2020 0939   LDLDIRECT 100.0 07/23/2018 1108        Physical Exam:    VS:  BP 100/62   Pulse 77   Ht 5\' 1"  (1.549 m)   Wt 136 lb 6.4 oz (61.9 kg)   SpO2 95%   BMI 25.77 kg/m     Wt Readings from Last 3 Encounters:  12/08/22 136 lb 6.4 oz (61.9 kg)  09/08/22 136 lb 12.8 oz (62.1 kg)  09/02/22 137 lb 12.8 oz (62.5 kg)    Gen: No distress   Cardiac: No rubs or gallops, regular rhythm  no murmur Respiratory: Occasional Rhonchi right lower lung field, normal effort, normal  respiratory rate GI: Soft, nontender, non-distended  MS: no edema;  moves all extremities Integument: Skin feels warm Neuro:  At time of evaluation, alert and oriented to person/place/time/situation  Psych: Normal affect, patient feels better  ASSESSMENT:    1. Aortic atherosclerosis ([place])   2. Essential hypertension   3. Chronic systolic CHF (congestive heart failure), NYHA class 3 ([place])   4. NICM (nonischemic cardiomyopathy) ([place])   5. Fatty liver   6. Hyperlipidemia LDL goal <100     PLAN:    HFrEF  - NYHA Class I Stage C, euvolemic Prior PVCs HTN CKD Stage IIIa - continue Entesto 97/103 - continue aldactone 25 mg - continue Farxiga 10 mg - Lasix as a PRN; gave education on when to restart - on metoprolol 200 mg PO daily  Aortic Athosclerosis and HLD - continue current statin; at next visit if still elevated LDL will increase to 80 mg   Me in July 2024 prior to her big [employer] Trip       Medication Adjustments/Labs and Tests Ordered: Current medicines are reviewed at length with the patient today.  Concerns regarding medicines are outlined above.  No orders of the defined types were placed in this encounter.  No orders of the defined types were placed in this encounter.   Patient Instructions  Medication  Instructions:  Your physician recommends that you continue on your current medications as directed. Please refer to the Current Medication list given to you today.  *If you need a refill on your  cardiac medications before your next appointment, please call your pharmacy*   Lab Work: NONE If you have labs (blood work) drawn today and your tests are completely normal, you will receive your results only by: MyChart Message (if you have MyChart) OR A paper copy in the mail If you have any lab test that is abnormal or we need to change your treatment, we will call you to review the results.   Testing/Procedures: NONE   Follow-Up: At [facility], you and your health needs are our priority.  As part of our continuing mission to provide you with exceptional heart care, we have created designated Provider Care Teams.  These Care Teams include your primary Cardiologist (physician) and Advanced Practice Providers (APPs -  Physician Assistants and Nurse Practitioners) who all work together to provide you with the care you need, when you need it.  We recommend signing up for the patient portal called "MyChart".  Sign up information is provided on this After Visit Summary.  MyChart is used to connect with patients for Virtual Visits (Telemedicine).  Patients are able to view lab/test results, encounter notes, upcoming appointments, etc.  Non-urgent messages can be sent to your provider as well.   To learn more about what you can do with MyChart, go to [email or web address].    Your next appointment:   6 month(s)  The format for your next appointment:   In Person  Provider:   Christell Constant, MD      Important Information About Sugar         Signed, Christell Constant, MD  12/08/2022 10:06 AM    [facility]

## 2022-12-08 ENCOUNTER — Encounter: Payer: Self-pay | Admitting: Internal Medicine

## 2022-12-08 ENCOUNTER — Ambulatory Visit: Payer: Medicare Other | Attending: Internal Medicine | Admitting: Internal Medicine

## 2022-12-08 ENCOUNTER — Telehealth: Payer: Self-pay | Admitting: Internal Medicine

## 2022-12-08 VITALS — BP 100/62 | HR 77 | Ht 61.0 in | Wt 136.4 lb

## 2022-12-08 DIAGNOSIS — I10 Essential (primary) hypertension: Secondary | ICD-10-CM

## 2022-12-08 DIAGNOSIS — K760 Fatty (change of) liver, not elsewhere classified: Secondary | ICD-10-CM | POA: Diagnosis not present

## 2022-12-08 DIAGNOSIS — I5022 Chronic systolic (congestive) heart failure: Secondary | ICD-10-CM

## 2022-12-08 DIAGNOSIS — I700 Atherosclerosis of aorta: Secondary | ICD-10-CM | POA: Diagnosis not present

## 2022-12-08 DIAGNOSIS — I428 Other cardiomyopathies: Secondary | ICD-10-CM | POA: Diagnosis not present

## 2022-12-08 DIAGNOSIS — E785 Hyperlipidemia, unspecified: Secondary | ICD-10-CM | POA: Diagnosis not present

## 2022-12-08 NOTE — Telephone Encounter (Signed)
Pt calling to speak to RN about [name] papers and gave to RN today.

## 2022-12-08 NOTE — Patient Instructions (Signed)
Medication Instructions:  Your physician recommends that you continue on your current medications as directed. Please refer to the Current Medication list given to you today.  *If you need a refill on your cardiac medications before your next appointment, please call your pharmacy*   Lab Work: NONE If you have labs (blood work) drawn today and your tests are completely normal, you will receive your results only by: [place] (if you have MyChart) OR A paper copy in the mail If you have any lab test that is abnormal or we need to change your treatment, we will call you to review the results.   Testing/Procedures: NONE   Follow-Up: At [facility], you and your health needs are our priority.  As part of our continuing mission to provide you with exceptional heart care, we have created designated Provider Care Teams.  These Care Teams include your primary Cardiologist (physician) and Advanced Practice Providers (APPs -  Physician Assistants and Nurse Practitioners) who all work together to provide you with the care you need, when you need it.  We recommend signing up for the patient portal called "MyChart".  Sign up information is provided on this After Visit Summary.  MyChart is used to connect with patients for Virtual Visits (Telemedicine).  Patients are able to view lab/test results, encounter notes, upcoming appointments, etc.  Non-urgent messages can be sent to your provider as well.   To learn more about what you can do with MyChart, go to [email or web address].    Your next appointment:   6 month(s)  The format for your next appointment:   In Person  Provider:   Werner Lean, MD      Important Information About Sugar

## 2022-12-08 NOTE — Telephone Encounter (Signed)
Left a message to call back.  Advised pt MD has signed [employer] pt assistance application for Entresto 93-103 mg PO BID.

## 2022-12-09 NOTE — Telephone Encounter (Signed)
Patient returned RN's call.  Patient would like to stop by after 3:30 pm to drop off her part of the documentation if that time is convenient for the RN.

## 2022-12-09 NOTE — Telephone Encounter (Signed)
Called pt advised that MD page is complete.  Pt will drop off her portion today after 3:30 pm.

## 2022-12-12 NOTE — Telephone Encounter (Signed)
**Note De-Identified Masaichi Kracht Obfuscation** I have received the pts completed NPAF application for [facility] assistance with documents that Dr Gasper Sells signed and dated on 12/08/22. I have e-mailed all to NPAF Kashden Deboy Onbase. Fax: Tx 'ok' Report CONE_EMAIL-to-Fax Mariella Blackwelder, Mardene Celeste This message was sent Lula Kolton [facility], a product from [employer] [email or web address]://www.biscom.com/                    -------Fax Transmission Report-------  To:               Recipient at [phone] Subject:          Fw: Richetta Wierzba: 1941-01-31 Entresto Pt Assistance Application Result:           The transmission was successful. Explanation:      All Pages Ok Pages Sent:       7 Connect Time:     4 minutes, 29 seconds Transmit Time:    12/12/2022 08:04 Transfer Rate:    14400 Status Code:      0000 Retry Count:      0 Job Id:           [id number] Unique Id:        [id number] Fax Line:         29 Fax Server:       [id number]

## 2022-12-14 NOTE — Telephone Encounter (Signed)
**Note De-Identified [name] Obfuscation** Letter received from NPAF stating that they have approved the pt for [facility] until 12/05/2023. Pt ID: [id number]  The letter states that they have notified the pt of this approval as well.

## 2022-12-19 ENCOUNTER — Ambulatory Visit (INDEPENDENT_AMBULATORY_CARE_PROVIDER_SITE_OTHER): Payer: No Typology Code available for payment source | Admitting: Family Medicine

## 2022-12-19 ENCOUNTER — Encounter: Payer: Self-pay | Admitting: Family Medicine

## 2022-12-19 VITALS — BP 114/67 | HR 66 | Temp 97.2°F | Ht 61.0 in | Wt 138.2 lb

## 2022-12-19 DIAGNOSIS — Z0000 Encounter for general adult medical examination without abnormal findings: Secondary | ICD-10-CM

## 2022-12-19 DIAGNOSIS — R739 Hyperglycemia, unspecified: Secondary | ICD-10-CM

## 2022-12-19 DIAGNOSIS — R809 Proteinuria, unspecified: Secondary | ICD-10-CM

## 2022-12-19 DIAGNOSIS — E785 Hyperlipidemia, unspecified: Secondary | ICD-10-CM

## 2022-12-19 DIAGNOSIS — I700 Atherosclerosis of aorta: Secondary | ICD-10-CM | POA: Diagnosis not present

## 2022-12-19 DIAGNOSIS — N183 Chronic kidney disease, stage 3 unspecified: Secondary | ICD-10-CM | POA: Diagnosis not present

## 2022-12-19 LAB — LIPID PANEL
Cholesterol: 165 mg/dL (ref 0–200)
HDL: 45.8 mg/dL (ref >39.00)
LDL Cholesterol: 109 mg/dL — ABNORMAL HIGH (ref 0–99)
NonHDL: 119.39
Total CHOL/HDL Ratio: 4
Triglycerides: 51 mg/dL (ref 0.0–149.0)
VLDL: 10.2 mg/dL (ref 0.0–40.0)

## 2022-12-19 LAB — CBC WITH DIFFERENTIAL/PLATELET
Basophils Absolute: 0.1 10*3/uL (ref 0.0–0.1)
Basophils Relative: 1.2 % (ref 0.0–3.0)
Eosinophils Absolute: 0.2 10*3/uL (ref 0.0–0.7)
Eosinophils Relative: 3.3 % (ref 0.0–5.0)
HCT: 41.6 % (ref 36.0–46.0)
Hemoglobin: 13.5 g/dL (ref 12.0–15.0)
Lymphocytes Relative: 26 % (ref 12.0–46.0)
Lymphs Abs: 1.3 10*3/uL (ref 0.7–4.0)
MCHC: 32.4 g/dL (ref 30.0–36.0)
MCV: 88 fl (ref 78.0–100.0)
Monocytes Absolute: 0.4 10*3/uL (ref 0.1–1.0)
Monocytes Relative: 8.5 % (ref 3.0–12.0)
Neutro Abs: 3 10*3/uL (ref 1.4–7.7)
Neutrophils Relative %: 61 % (ref 43.0–77.0)
Platelets: 214 10*3/uL (ref 150.0–400.0)
RBC: 4.73 Mil/uL (ref 3.87–5.11)
RDW: 14.2 % (ref 11.5–15.5)
WBC: 4.9 10*3/uL (ref 4.0–10.5)

## 2022-12-19 LAB — MICROALBUMIN / CREATININE URINE RATIO
Creatinine,U: 56 mg/dL
Microalb Creat Ratio: 1.4 mg/g (ref 0.0–30.0)
Microalb, Ur: 0.8 mg/dL (ref 0.0–1.9)

## 2022-12-19 LAB — COMPREHENSIVE METABOLIC PANEL
ALT: 11 U/L (ref 0–35)
AST: 18 U/L (ref 0–37)
Albumin: 3.9 g/dL (ref 3.5–5.2)
Alkaline Phosphatase: 105 U/L (ref 39–117)
BUN: 30 mg/dL — ABNORMAL HIGH (ref 6–23)
CO2: 28 mEq/L (ref 19–32)
Calcium: 9.6 mg/dL (ref 8.4–10.5)
Chloride: 104 mEq/L (ref 96–112)
Creatinine, Ser: 1.67 mg/dL — ABNORMAL HIGH (ref 0.40–1.20)
GFR: 28.28 mL/min — ABNORMAL LOW (ref >60.00)
Glucose, Bld: 98 mg/dL (ref 70–99)
Potassium: 4.7 mEq/L (ref 3.5–5.1)
Sodium: 139 mEq/L (ref 135–145)
Total Bilirubin: 0.6 mg/dL (ref 0.2–1.2)
Total Protein: 6.5 g/dL (ref 6.0–8.3)

## 2022-12-19 LAB — HEMOGLOBIN A1C: Hgb A1c MFr Bld: 6.5 % (ref 4.6–6.5)

## 2022-12-19 NOTE — Progress Notes (Signed)
Phone [phone]   Subjective:  Patient presents today for their annual physical. Chief complaint-noted.   See problem oriented charting- ROS- full  review of systems was completed and negative except for: slight cough (taking zinc and doing better), right hand numbness if right arm cramps but rarely  The following were reviewed and entered/updated in epic: Past Medical History:  Diagnosis Date   Arthritis    Bronchitis    hx of   GERD (gastroesophageal reflux disease)    History of bleeding ulcers    History of gastrointestinal hemorrhage    now off aspirin   Hyperlipidemia    Hypertension    Osteoarthritis of right knee 06/03/2012   Patient Active Problem List   Diagnosis Date Noted   Chronic systolic CHF (congestive heart failure), NYHA class 3 (HCC)     Priority: High   NICM (nonischemic cardiomyopathy) (HCC)     Priority: High   History of gastrointestinal hemorrhage     Priority: High   Fatty liver 01/07/2022    Priority: Medium    Microalbuminuria 12/17/2020    Priority: Medium    Aortic atherosclerosis (HCC) 06/18/2020    Priority: Medium    Hyperglycemia 04/16/2020    Priority: Medium    Chronic kidney disease (CKD), stage III (moderate) (HCC) 10/04/2019    Priority: Medium    GERD (gastroesophageal reflux disease) 05/27/2014    Priority: Medium    Hyperlipidemia LDL goal <100 08/21/2008    Priority: Medium    Essential hypertension 08/21/2008    Priority: Medium    PVC (premature ventricular contraction) 02/14/2022    Priority: Low   Arthritis of right hip 10/10/2019    Priority: Low   Bursitis of right hip 10/10/2019    Priority: Low   Allergic rhinitis 10/03/2019    Priority: Low   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/08/2016    Priority: Low   Osteoarthritis of right knee 06/03/2012    Priority: Low   Past Surgical History:  Procedure Laterality Date   ABDOMINAL HYSTERECTOMY     nonmalignant reasons   APPENDECTOMY     RIGHT/LEFT HEART CATH AND  CORONARY ANGIOGRAPHY N/A 02/25/2022   Procedure: RIGHT/LEFT HEART CATH AND CORONARY ANGIOGRAPHY;  Surgeon: Kathleene Hazel, MD;  Location: [facility];  Service: Cardiovascular;  Laterality: N/A;   TOTAL KNEE ARTHROPLASTY  05/30/2012   Procedure: TOTAL KNEE ARTHROPLASTY;  Surgeon: Loreta Ave, MD;  Location: [facility] OR;  Service: Orthopedics;  Laterality: Right;  DR MURPHY WANTS 90 MINUTES FOR THIS CASE. 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     cyanocobalamin 100 MCG tablet Take 100 mcg by mouth daily.     dapagliflozin propanediol (FARXIGA) 10 MG TABS tablet Take 10 mg by mouth daily.     furosemide (LASIX) 20 MG tablet Take  1 tablet (20 mg total) by mouth as needed (swelling). 90 tablet 3   metFORMIN (GLUCOPHAGE) 500 MG tablet TAKE 1 TABLET(500 MG) BY MOUTH DAILY WITH BREAKFAST 30 tablet 5   metoprolol (TOPROL XL) 200 MG 24 hr tablet Take 1 tablet (200 mg total) by mouth daily. 90 tablet 3   Multiple Vitamin (MULTI-DAY PO) Take 2 tablets by mouth daily. womens multivitiamin gummies 2 a day     Multiple Vitamins-Minerals (MEGAVITE FRUITS & VEGGIES PO) Take by mouth. Balance nature fruits and veggies     omeprazole (PRILOSEC) 20 MG capsule TAKE 1 CAPSULE(20 MG) BY MOUTH DAILY 90 capsule 1   sacubitril-valsartan (ENTRESTO) 97-103 MG Take 1 tablet by mouth 2 (two) times daily. 180 tablet 3   spironolactone  (ALDACTONE) 25 MG tablet Take 1 tablet (25 mg total) by mouth daily. 90 tablet 3   triamcinolone cream (KENALOG) 0.1 % Apply 1 Application topically 2 (two) times daily. For 7-10 days maximum 80 g 0   No current facility-administered medications for this visit.    Allergies-reviewed and updated Allergies  Allergen Reactions   Coumadin [Warfarin Sodium]     Ulcers   Methocarbamol     Hallucinations   Norco [Hydrocodone-Acetaminophen]     Hallucinations    Social History   Social History Narrative   Lives alone. Great granddaughter stays with her on weekends.    Divorced 69.       Still doing childcare   Retired from [employer]   Objective  Objective:  BP 114/67 (BP Location: Right Arm, Patient Position: Sitting)   Pulse 66   Temp (!) 97.2 F (36.2 C) (Temporal)   Ht 5\' 1"  (1.549 m)   Wt 138 lb 3.2 oz (62.7 kg)   SpO2 91%   BMI 26.11 kg/m  Gen: NAD, resting comfortably HEENT: Mucous membranes are moist. Oropharynx normal Neck: no thyromegaly CV: RRR no murmurs rubs or gallops Lungs: CTAB no crackles, wheeze, rhonchi Abdomen: soft/nontender/nondistended/normal bowel sounds. No rebound or guarding.  Ext: no edema Skin: warm, dry Neuro: grossly normal, moves all extremities, PERRLA   Assessment and Plan   82 y.o. female presenting for annual physical.  Health Maintenance counseling: 1. Anticipatory guidance: Patient counseled regarding regular dental exams -q6 months, eye exams - yearly,  avoiding smoking and second hand smoke , limiting alcohol to 1 beverage per day , no illicit drugs .   2. Risk factor reduction:  Advised patient of need for regular exercise and diet rich and fruits and vegetables to reduce risk of heart attack and stroke.  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  Diet/weight management-weight down from last year but was having some fluid retention  issues then with CHF- much better now.  Wt Readings from Last 3 Encounters:  12/19/22 138 lb 3.2 oz (62.7 kg)  12/08/22 136 lb 6.4 oz (61.9 kg)  09/08/22 136 lb 12.8 oz (62.1 kg)  3. Immunizations/screenings/ancillary studies-discussed Shingrix, Tdap, COVID vaccination at pharmacy  Immunization History  Administered Date(s) Administered   Fluad Quad(high Dose 65+) 10/03/2019, 09/23/2020, 09/08/2022   Influenza Split 09/08/2011, 10/29/2012   Influenza Whole 08/21/2008, 09/06/2010   Influenza, High Dose Seasonal PF 09/29/2016, 08/23/2018   Influenza,inj,Quad PF,6+ Mos 11/07/2013   PFIZER(Purple Top)SARS-COV-2 Vaccination 02/01/2020, 02/08/2020, 09/08/2020, 06/01/2021   PPD Test 03/24/2017, 03/25/2020   Pneumococcal Conjugate-13 11/07/2013   Pneumococcal Polysaccharide-23 12/05/2005, 09/08/2011  Td 12/05/2001   Tdap 10/29/2012   Zoster, Live 08/21/2008   4. Cervical cancer screening- past age based screening recs. No vaginal bleeding  5. Breast cancer screening-   mammogram - in December 2023 6. Colon cancer screening - negative stool cards 2019- otherwise past age based screening recs. No blood in stool or dark black stool 7. Skin cancer screening- lower risk due to melanin content.  Denies worrisome, changing, or new skin lesions.  8. Birth control/STD check- not sexually active and postmenopausal 9. Osteoporosis screening at 34- osteopenia 2019 with [facility]- in 2023 appears normal -Never smoker  Status of chronic or acute concerns   #social update- [place] in august!   #prior rash- bedbug related- she has treated this at home  # CHF- Dr. Izora Ribas cardiology- diagnosed 2023 S:Medication: farxiga 10 mg, lasix 20 mg daily prn, entresto 97-103 mg, spironolactone 25 mg, metoprolol 200 mg XR - has had  cath 02/25/22 with no evidence of CAD- remain off aspirin -weight has been stable even with changing lasix to as needed recently, no edema A/P: CHF appears  euvolemic- continue current medications    #Essential hypertension/CKD stage III with elevated microalbumin to creatinine ratio S: Compliant withlasix 20 mg daily, metoprolol 200 mg XR, entresto 49-51 mg, spironolactone 25 mg -off amlodipine due to CHF, also  Prior was on atenolol-chlorthalidone 50-25 mg but had worsening of renal function and issues maintaining potassium.  GFR ranging from high 30s to 50s since 2019- slightly worse after diuretics - Patient had elevated microalbumin to creatinine ratio over 500 ratio November 11, 2020-we started valsartan 80 mg daily BP Readings from Last 3 Encounters:  12/19/22 114/67  12/08/22 100/62  09/08/22 120/72  A/P: blood pressure stable- continue current medicines  CKD III- monitor closely   #Hyperlipidemia aortic atherosclerosis S: Compliant with atorvastatin 40 mg.  LDL goal at least under 100. Lab Results  Component Value Date   CHOL 172 08/23/2022   HDL 57 08/23/2022   LDLCALC 106 (H) 08/23/2022   LDLDIRECT 100.0 07/23/2018   TRIG 46 08/23/2022   CHOLHDL 3.0 08/23/2022  A/P:  lipids close to ideal goal on last check- check just direct LDL- prefer LDL at least under 100. Aortic atherosclerosis presumed stable   #GERD S: Compliant with omeprazole 20 mg.  -history of severe GI bleed years ago so keeping her on PPI despite CKD III A/P:reflux stable- continue current medicines    # Hyperglycemia/insulin resistance/prediabetes S:  Medication: Metformin 500 mg with breakfast started when A1c reached 6.1 and patient also had elevated microalbumin to creatinine ratio -also on farxiga for CHF Lab Results  Component Value Date   HGBA1C 6.2 06/29/2022   HGBA1C 6.2 12/17/2021   HGBA1C 6.2 06/04/2021  A/P: hopefully stable- update a1c today. Continue current meds for now  Recommended follow up: Return in about 6 months (around 06/19/2023) for followup or sooner if needed.Schedule b4 you leave. Future Appointments  Date Time Provider  Department Center  09/08/2023  9:00 AM [facility] HEALTH COACH [facility]   Lab/Order associations: fasting   ICD-10-CM   1. Preventative health care  Z00.00     2. Hyperlipidemia LDL goal <100  E78.5 CBC with Differential/Platelet    Comprehensive metabolic panel    Lipid panel    3. Hyperglycemia  R73.9 HgB A1c    4. Aortic atherosclerosis (HCC)  I70.0     5. Stage 3 chronic kidney disease, unspecified whether stage 3a or 3b CKD (HCC)  N18.30     6. Microalbuminuria  R80.9 Microalbumin / creatinine urine ratio     No orders of the defined types were placed in this encounter.  Return precautions advised.  Garret Reddish, MD

## 2022-12-19 NOTE — Patient Instructions (Addendum)
Health Maintenance Due  Topic Date Due   Zoster Vaccines- Shingrix (1 of 2)- Please check with your pharmacy to see if they have the shingrix vaccine. If they do- please get this immunization and update [place] by phone call or mychart with dates you receive the vaccine  Never done   COVID-19 Vaccine (5 - 2023-24 season)- consider at pharmacy 08/05/2022   DTaP/Tdap/Td (3 - Td or Tdap)- Tdap/tetanus at pharmacy 10/29/2022    Please stop by lab before you go If you have mychart- we will send your results within 3 business days of [place] receiving them.  If you do not have mychart- we will call you about results within 5 business days of [place] receiving them.  *please also note that you will see labs on mychart as soon as they post. I will later go in and write notes on them- will say "notes from Dr. Yong Channel"   Recommended follow up: Return in about 6 months (around 06/19/2023) for followup or sooner if needed.Schedule b4 you leave.

## 2022-12-20 ENCOUNTER — Other Ambulatory Visit: Payer: Self-pay

## 2022-12-20 DIAGNOSIS — R739 Hyperglycemia, unspecified: Secondary | ICD-10-CM

## 2023-01-19 ENCOUNTER — Encounter: Payer: Self-pay | Admitting: Family Medicine

## 2023-01-19 ENCOUNTER — Ambulatory Visit: Payer: No Typology Code available for payment source | Admitting: Family Medicine

## 2023-01-19 VITALS — BP 106/60 | HR 79 | Temp 98.6°F | Ht 61.0 in | Wt 141.2 lb

## 2023-01-19 DIAGNOSIS — R059 Cough, unspecified: Secondary | ICD-10-CM

## 2023-01-19 DIAGNOSIS — I10 Essential (primary) hypertension: Secondary | ICD-10-CM

## 2023-01-19 DIAGNOSIS — I5022 Chronic systolic (congestive) heart failure: Secondary | ICD-10-CM | POA: Diagnosis not present

## 2023-01-19 LAB — POC COVID19 BINAXNOW: SARS Coronavirus 2 Ag: NEGATIVE

## 2023-01-19 MED ORDER — BENZONATATE 100 MG PO CAPS: 100.0000 mg | ORAL_CAPSULE | Freq: Two times a day (BID) | ORAL | 0 refills | Status: DC | PRN

## 2023-01-19 MED ORDER — ATORVASTATIN CALCIUM 80 MG PO TABS: 80.0000 mg | ORAL_TABLET | Freq: Every day | ORAL | 3 refills | Status: DC

## 2023-01-19 NOTE — Progress Notes (Signed)
Phone [phone] In person visit   Subjective:   Maria [name] is a 82 y.o. 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    Priority: High   History of gastrointestinal hemorrhage     Priority: High   Fatty liver 01/07/2022    Priority: Medium    Microalbuminuria 12/17/2020    Priority: Medium    Aortic atherosclerosis ([place]) 06/18/2020    Priority: Medium    Chronic kidney disease (CKD), stage III (moderate) ([place]) 10/04/2019    Priority: Medium    GERD (gastroesophageal reflux disease) 05/27/2014    Priority: Medium    Hyperlipidemia LDL goal <100 08/21/2008    Priority: Medium    Essential hypertension 08/21/2008    Priority: Medium    PVC (premature ventricular contraction) 02/14/2022    Priority: Low   Arthritis of right hip 10/10/2019    Priority: Low   Bursitis of right hip 10/10/2019    Priority: Low   Allergic rhinitis 10/03/2019    Priority: Low   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/08/2016    Priority: Low   Osteoarthritis of right knee 06/03/2012    Priority: Low    Medications- reviewed and updated Current Outpatient Medications  Medication Sig Dispense Refill   benzonatate (TESSALON) 100 MG capsule Take 1 capsule (100 mg total) by mouth 2 (two) times daily as needed for cough. 20 capsule 0   Calcium Carbonate-Vitamin D (CALCIUM PLUS VITAMIN D PO) Take 2 tablets by mouth daily.     cyanocobalamin 100 MCG tablet Take 100 mcg by mouth daily.     dapagliflozin propanediol  (FARXIGA) 10 MG TABS tablet Take 10 mg by mouth daily.     furosemide (LASIX) 20 MG tablet Take 1 tablet (20 mg total) by mouth as needed (swelling). 90 tablet 3   metFORMIN (GLUCOPHAGE) 500 MG tablet TAKE 1 TABLET(500 MG) BY MOUTH DAILY WITH BREAKFAST 30 tablet 5   metoprolol (TOPROL XL) 200 MG 24 hr tablet Take 1 tablet (200 mg total) by mouth daily. 90 tablet 3   Multiple Vitamin (MULTI-DAY PO) Take 2 tablets by mouth daily. womens multivitiamin gummies 2 a day     Multiple Vitamins-Minerals (MEGAVITE FRUITS & VEGGIES PO) Take by mouth. Balance nature fruits and veggies     omeprazole (PRILOSEC) 20 MG capsule TAKE 1 CAPSULE(20 MG) BY MOUTH DAILY 90 capsule 1   sacubitril-valsartan (ENTRESTO) 97-103 MG Take 1 tablet by mouth 2 (two) times daily. 180 tablet 3   spironolactone (ALDACTONE) 25 MG tablet Take 1 tablet (25 mg total) by mouth daily. 90 tablet 3   triamcinolone cream (KENALOG) 0.1 % Apply 1 Application topically 2 (two) times daily. For 7-10 days maximum 80 g 0   atorvastatin (LIPITOR) 80 MG tablet Take 1 tablet (80 mg total) by mouth daily. 90 tablet 3   No current facility-administered medications for this visit.     Objective:  BP 106/60   Pulse 79   Temp  98.6 F (37 C) (Temporal)   Ht 5' 1"$  (1.549 m)   Wt 141 lb 3.2 oz (64 kg)   SpO2 95%   BMI 26.68 kg/m  Gen: NAD, resting comfortably Pharynx with mild erythema, nasal turbinates mildly erythematous and swollen CV: RRR no murmurs rubs or gallops Lungs: CTAB no crackles, wheeze, rhonchi Abdomen: soft/nontender/nondistended/normal bowel sounds. No rebound or guarding.  Ext: trace edema Skin: warm, dry  Results for orders placed or performed in visit on 01/19/23 (from the past 24 hour(s))  POC COVID-19     Status: None   Collection Time: 01/19/23  3:55 PM  Result Value Ref Range   SARS Coronavirus 2 Ag Negative Negative       Assessment and Plan   # Cough S:she developed cough starting about 2 days ago. She is  concerned because CHF  issues started with cough -weight up 3 lbs in a month. Wt 139 on home scale this morning. A month ago probably 136.  - no increased increased swelling in legs or abdomen per her report- trace noted on exam - no orthopnea Wt Readings from Last 3 Encounters:  01/19/23 141 lb 3.2 oz (64 kg)  12/19/22 138 lb 3.2 oz (62.7 kg)  12/08/22 136 lb 6.4 oz (61.9 kg)  A/P: patients initial CHF presentation included cough and she has new cough, is up 5 lbs and we note increased edema . Could have URI, covid negative. Will trial increased lasix BID for 3 days and have her update me on cough/edema/weight on Monday.  -she requests something for cough- sent in tessalon  # CHF- Dr. Gasper Sells cardiology- diagnosed 2023 S:Medication: farxiga 10 mg, lasix 20 mg daily prn, entresto 97-103 mg, spironolactone 25 mg, metoprolol 200 mg XR - has had  cath 02/25/22 with no evidence of CAD- remain off aspirin A/P: typically has been controlled but appears to have mild poor control today- increase lasix x 3 days as noted and update me on Monday.    #Essential hypertension S: Compliant with lasix 20 mg daily, metoprolol 200 mg XR, entresto 49-51 mg- recently increased to 97-103 but ran out today- told her she could use old dose twice daily until gets rx picked up, spironolactone 25 mg BP Readings from Last 3 Encounters:  01/19/23 106/60  12/19/22 114/67  12/08/22 100/62  A/P: blood pressure well controlled/low normal- she ais asymptomatic - continue current medications    #hyperlipidemia S: Medication:atorvastatin 40 mg with results below but has increased to 47m (taking 2 tablets of atorvastatin 418m  Lab Results  Component Value Date   CHOL 165 12/19/2022   HDL 45.80 12/19/2022   LDLCALC 109 (H) 12/19/2022   LDLDIRECT 100.0 07/23/2018   TRIG 51.0 12/19/2022   CHOLHDL 4 12/19/2022   A/P: hopefully improved- update lipids likely next visit- continue current medications for now    Recommended follow up: Return for as needed for new, worsening, persistent symptoms. Future Appointments  Date Time Provider DeDowns2/26/2024  8:00 AM JoClydell HakimRD [facility]  06/20/2023  8:20 AM HuMarin OlpMD [facility]  09/08/2023  9:00 AM [facility] HEALTH COACH [facility]    Lab/Order associations:   ICD-10-CM   1. Cough, unspecified type  R05.9 POC COVID-19    2. Chronic systolic CHF (congestive heart failure), NYHA class 3 (HCC)  I50.22     3. Essential hypertension  I10       Meds ordered this encounter  Medications  atorvastatin (LIPITOR) 80 MG tablet    Sig: Take 1 tablet (80 mg total) by mouth daily.    Dispense:  90 tablet    Refill:  3   benzonatate (TESSALON) 100 MG capsule    Sig: Take 1 capsule (100 mg total) by mouth 2 (two) times daily as needed for cough.    Dispense:  20 capsule    Refill:  0    Return precautions advised.  Garret Reddish, MD

## 2023-01-19 NOTE — Patient Instructions (Addendum)
Take lasix twice a day separated by 6 hours for the next 3 days (Friday, Saturday, Sunday) then go back to once a day  Call me on Monday with how you are doing- your home weight (hoping for 136 again) and how your cough is doing  Recommended follow up: Return for as needed for new, worsening, persistent symptoms.

## 2023-01-30 ENCOUNTER — Encounter: Payer: No Typology Code available for payment source | Attending: Family Medicine | Admitting: Dietician

## 2023-01-30 ENCOUNTER — Encounter: Payer: Self-pay | Admitting: Dietician

## 2023-01-30 VITALS — Ht 61.2 in | Wt 136.0 lb

## 2023-01-30 DIAGNOSIS — N184 Chronic kidney disease, stage 4 (severe): Secondary | ICD-10-CM | POA: Diagnosis not present

## 2023-01-30 DIAGNOSIS — E119 Type 2 diabetes mellitus without complications: Secondary | ICD-10-CM | POA: Diagnosis not present

## 2023-01-30 NOTE — Progress Notes (Signed)
Diabetes Self-Management Education  Visit Type: First/Initial  Appt. Start Time: 0810 Appt. End Time: 0930  01/30/2023  [name], identified by name and date of birth, is a 82 y.o. female with a diagnosis of Diabetes: Type 2.   ASSESSMENT Patient is here alone.  Today, here in the office, her BG  is 95 mg/dl  fasting.  She has A1C 6.5 % and doesn't accept the fact that she has diabetes. Referred diagnosis:  Type 2 diabetes and CKD  History includes:   type 2 diabetes, CKD, HTN, HLD. GERD. Medications include:  Metformin , Farxiga, Lasix, spironolactone. Labs noted:  A1C 6,5 % 12/19/22, BUN 30 , Creatinine 1.67 Potasium 4.7, GFR 28 12/19/2022  Pt son lives with her.  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   Height 5' 1.2" (1.554 m), weight 136 lb (61.7 kg). Body mass index is 25.53 kg/m.   Diabetes Self-Management Education - 01/30/23 1238       Visit Information   Visit Type First/Initial      Initial Visit   Diabetes Type Type 2    Date Diagnosed 12/2022    Are you currently following a meal plan? No    Are you taking your medications as prescribed? Yes      Health Coping   How would you rate your overall health? Good      Psychosocial Assessment   Patient Belief/Attitude about Diabetes Afraid    Self-care barriers None    Self-management support Doctor's office    Other persons present Patient    Patient Concerns Healthy Lifestyle;Glycemic Control;Nutrition/Meal planning    Special Needs None    Preferred Learning Style No preference indicated    Learning Readiness Contemplating    How often do you need to have someone help you when you read instructions, pamphlets, or other written materials from your doctor or pharmacy? 1 - Never    What is the last grade level you completed in school? College degree      Pre-Education Assessment   Patient understands the diabetes disease and treatment  process. Needs Instruction    Patient understands incorporating nutritional management into lifestyle. Needs Instruction    Patient undertands incorporating physical activity into lifestyle. Needs Instruction    Patient understands using medications safely. Needs Instruction    Patient understands monitoring blood glucose, interpreting and using results Needs Instruction    Patient understands prevention, detection, and treatment of acute complications. Needs Instruction    Patient understands prevention, detection, and treatment of chronic complications. Needs Instruction    Patient understands how to develop strategies to address psychosocial issues. Needs Instruction    Patient understands how to develop strategies to promote health/change behavior. Needs Instruction      Complications   Last HgB A1C per patient/outside source 6.5 %   12/24/2022   How often do you check your blood sugar? 0 times/day (not testing)    Have you had a dilated eye exam in the past 12 months? Yes    Have you had a dental exam in the past 12 months? Yes    Are you checking your feet? Yes    How many days per week are you checking your feet? 7      Dietary Intake   Breakfast Banana, cheerios (plane) milk 2%    Lunch Lemona[place], chicken, sandwich OR Cobb salad    [employer] style  steak, rice and gravy, lime beans and corn    Beverage(s) water, lemonade, milk 2% pineapple soda      Activity / Exercise   Activity / Exercise Type Light (walking / raking leaves)    How many days per week do you exercise? 3    How many minutes per day do you exercise? 20    Total minutes per week of exercise 60      Patient Education   Previous Diabetes Education No    Disease Pathophysiology Definition of diabetes, type 1 and 2, and the diagnosis of diabetes    Healthy Eating Role of diet in the treatment of diabetes and the relationship between the three main macronutrients and blood glucose level;Food label reading, portion  sizes and measuring food.;Plate Method;Meal options for control of blood glucose level and chronic complications.    Being Active Role of exercise on diabetes management, blood pressure control and cardiac health.    Medications Reviewed patients medication for diabetes, action, purpose, timing of dose and side effects.    Monitoring Identified appropriate SMBG and/or A1C goals.;Daily foot exams;Yearly dilated eye exam    Diabetes Stress and Support Worked with patient to identify barriers to care and solutions      Individualized Goals (developed by patient)   Nutrition General guidelines for healthy choices and portions discussed    Physical Activity Exercise 3-5 times per week;30 minutes per day    Medications take my medication as prescribed    Problem Solving Addressing barriers to behavior change      Post-Education Assessment   Patient understands the diabetes disease and treatment process. Needs Review    Patient understands incorporating nutritional management into lifestyle. Needs Review    Patient undertands incorporating physical activity into lifestyle. Demonstrates understanding / competency    Patient understands using medications safely. Demonstrates understanding / competency    Patient understands monitoring blood glucose, interpreting and using results N/A    Patient understands prevention, detection, and treatment of acute complications. Comprehends key points    Patient understands prevention, detection, and treatment of chronic complications. Needs Review    Patient understands how to develop strategies to address psychosocial issues. Needs Review    Patient understands how to develop strategies to promote health/change behavior. Comprehends key points      Outcomes   Expected Outcomes Demonstrated interest in learning. Expect positive outcomes    Future DMSE 2 months    Program Status Not Completed             Individualized Plan for Diabetes Self-Management  Training:   Learning Objective:  Patient will have a greater understanding of diabetes self-management. Patient education plan is to attend individual and/or group sessions per assessed needs and concerns.   Plan:   Patient Instructions  Avoid sweet drinks such as lemonade, fruit juice, or regular soda Prefer water, herbal tea, or diet drinks.  Continue a low sodium diet. Avoid deep fry foods.  Eat a balance meals. 1/2 of the plate with Non starchy vegetables, 1/4 with carbohydrates food, 1/4 plate with protein.  Read labels and look for 15 grams carbohydrates or less when choosing foods such as yogurt  Aim for 30 minutes of exercise daily  Your Weight today (01/30/2023) is 136 lb. Blood glucose fasting is 95 mg/dl  (12/19/2022) A1C  is 6.5 %           Expected Outcomes:  Demonstrated interest in learning. Expect positive outcomes  Education material provided:  ADA - How to Thrive: A Guide for Your Journey with Diabetes, Food label handouts, Meal plan card, and My Plate  If problems or questions, patient to contact team via:  Phone  Future DSME appointment: 2 months

## 2023-01-30 NOTE — Patient Instructions (Addendum)
Avoid sweet drinks such as lemonade, fruit juice, or regular soda Prefer water, herbal tea, or diet drinks.  Continue a low sodium diet. Avoid deep fry foods.  Eat a balance meals. 1/2 of the plate with Non starchy vegetables, 1/4 with carbohydrates food, 1/4 plate with protein.  Read labels and look for 15 grams carbohydrates or less when choosing foods such as yogurt  Aim for 30 minutes of exercise daily  Your Weight today (01/30/2023) is 136 lb. Blood glucose fasting is 95 mg/dl  (12/19/2022) A1C  is 6.5 %

## 2023-02-20 ENCOUNTER — Telehealth: Payer: Self-pay | Admitting: Internal Medicine

## 2023-02-20 ENCOUNTER — Other Ambulatory Visit: Payer: Self-pay | Admitting: *Deleted

## 2023-02-20 NOTE — Telephone Encounter (Signed)
Patient calling the office for samples of medication:   1.  What medication and dosage are you requesting samples for? Entresto   2.  Are you currently out of this medication?   Patient states she only has 2 pills remaining.

## 2023-02-20 NOTE — Telephone Encounter (Signed)
Called pt and pt's pharmacy cover my meds, patient assistance program and they have to schedule an deliver for the pt's medication and I also called the pt and gave the pt the number to call to have this set up for deliver. I advised the pt that if she has any other problems, questions or concerns, to give our office a call back. Pt verbalized understanding.

## 2023-03-09 ENCOUNTER — Encounter: Payer: Self-pay | Admitting: Dietician

## 2023-03-09 ENCOUNTER — Encounter: Payer: No Typology Code available for payment source | Attending: Family Medicine | Admitting: Dietician

## 2023-03-09 VITALS — Wt 135.0 lb

## 2023-03-09 DIAGNOSIS — E119 Type 2 diabetes mellitus without complications: Secondary | ICD-10-CM | POA: Diagnosis not present

## 2023-03-09 NOTE — Patient Instructions (Addendum)
Avoid sweet drinks such as lemonade, fruit juice, or regular soda Prefer water, herbal tea, or diet drinks.   Continue a low sodium diet. Avoid deep fry foods.   Eat a balance meals. 1/2 of the plate with Non starchy vegetables, 1/4 with carbohydrates food, 1/4 plate with protein.   Small portion meat and rare red meat.   Aim for 30 minutes of exercise daily   Your Weight today (03/08/2023) is 135 lb.

## 2023-03-09 NOTE — Progress Notes (Signed)
Diabetes Self-Management Education  Visit Type: Follow-up  Appt. Start Time: 0950 Appt. End Time: 1030  03/09/2023  Maria [name], identified by name and date of birth, is a 82 y.o. female with a diagnosis of Diabetes: Type 2.   ASSESSMENT Patient is here today alone.  She was last seen by this RD on 01/30/2023.  Ordered food without salt.  Uses Ms. Dash at home and avoids salt. Small amounts of desserts rarely. Continues to stay active and works. Weight stable - 135 lbs today and 136 lbs 01/28/2023.  Referred diagnosis:  Type 2 diabetes and CKD   History includes:   type 2 diabetes, CKD, HTN, HLD. GERD. Medications include:  Metformin , Farxiga, Lasix, spironolactone. Labs noted:  A1C 6.5 % 12/19/22, BUN 30 , Creatinine 1.67 Potasium 4.7, GFR 28 12/19/2022   Pt son lives with her.  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 Weight 135 lb (61.2 kg). Body mass index is 25.34 kg/m.   Diabetes Self-Management Education - 03/09/23 1826       Visit Information   Visit Type Follow-up      Initial Visit   Diabetes Type Type 2    Date Diagnosed 12/2022    Are you currently following a meal plan? Yes   diabetic   Are you taking your medications as prescribed? Yes      Psychosocial Assessment   Patient Belief/Attitude about Diabetes Motivated to manage diabetes    Self-care barriers None    Self-management support Doctor's office;CDE visits    Other persons present Patient    Patient Concerns Nutrition/Meal planning    Special Needs None    Preferred Learning Style No preference indicated    Learning Readiness Ready    How often do you need to have someone help you when you read instructions, pamphlets, or other written materials from your doctor or pharmacy? 1 - Never      Pre-Education Assessment   Patient understands the diabetes disease and treatment process. Demonstrates understanding / competency    Patient  understands incorporating nutritional management into lifestyle. Demonstrates understanding / competency    Patient undertands incorporating physical activity into lifestyle. Demonstrates understanding / competency    Patient understands using medications safely. Demonstrates understanding / competency    Patient understands monitoring blood glucose, interpreting and using results Demonstrates understanding / competency    Patient understands prevention, detection, and treatment of acute complications. Demonstrates understanding / competency    Patient understands prevention, detection, and treatment of chronic complications. Demonstrates understanding / competency    Patient understands how to develop strategies to address psychosocial issues. Demonstrates understanding / competency    Patient understands how to develop strategies to promote health/change behavior. Demonstrates understanding / competency      Complications   How often do you check your blood sugar? Not recommended by provider    Number of hyperglycemic episodes ( >200mg /dL): Never      Dietary Intake   Breakfast Greek yogurt and apple    Lunch Hash brown, grilled chicken sandwich with lettuce and tomato OR fish sandwich on [employer] bread    Snack (afternoon) Occasional greek yogurt    Dinner Steak, Broccoli with carrots, salad with [place] OR catfish, brunswick stew, hush puppies    Beverage(s) Water, rare peach lemonade or lightly sweetened lemonade, 2% milk, occasional decaf coffee with cream, 1 pack sugar  Activity / Exercise   Activity / Exercise Type Light (walking / raking leaves)      Patient Education   Previous Diabetes Education Yes (please comment)   01/30/2023   Healthy Eating Meal options for control of blood glucose level and chronic complications.;Food label reading, portion sizes and measuring food.    Being Active Role of exercise on diabetes management, blood pressure control and cardiac health.     Medications Reviewed patients medication for diabetes, action, purpose, timing of dose and side effects.    Diabetes Stress and Support Identified and addressed patients feelings and concerns about diabetes      Individualized Goals (developed by patient)   Nutrition General guidelines for healthy choices and portions discussed    Physical Activity Exercise 3-5 times per week;30 minutes per day    Medications take my medication as prescribed    Monitoring  Not Applicable      Patient Self-Evaluation of Goals - Patient rates self as meeting previously set goals (% of time)   Nutrition >75% (most of the time)    Physical Activity >75% (most of the time)    Medications >75% (most of the time)    Monitoring >75% (most of the time)    Problem Solving and behavior change strategies  >75% (most of the time)    Reducing Risk (treating acute and chronic complications) [id number] (most of the time)    Health Coping >75% (most of the time)      Post-Education Assessment   Patient understands the diabetes disease and treatment process. Demonstrates understanding / competency    Patient understands incorporating nutritional management into lifestyle. Demonstrates understanding / competency    Patient undertands incorporating physical activity into lifestyle. Demonstrates understanding / competency    Patient understands using medications safely. Demonstrates understanding / competency    Patient understands monitoring blood glucose, interpreting and using results Demonstrates understanding / competency    Patient understands prevention, detection, and treatment of acute complications. Demonstrates understanding / competency    Patient understands prevention, detection, and treatment of chronic complications. Demonstrates understanding / competency    Patient understands how to develop strategies to address psychosocial issues. Demonstrates understanding / competency    Patient understands how to develop  strategies to promote health/change behavior. Demonstrates understanding / competency      Outcomes   Expected Outcomes Demonstrated interest in learning. Expect positive outcomes    Future DMSE 3-4 months    Program Status Not Completed      Subsequent Visit   Since your last visit have you experienced any weight changes? Loss    Weight Loss (lbs) 1             Individualized Plan for Diabetes Self-Management Training:   Learning Objective:  Patient will have a greater understanding of diabetes self-management. Patient education plan is to attend individual and/or group sessions per assessed needs and concerns.   Plan:   Patient Instructions  Avoid sweet drinks such as lemonade, fruit juice, or regular soda Prefer water, herbal tea, or diet drinks.   Continue a low sodium diet. Avoid deep fry foods.   Eat a balance meals. 1/2 of the plate with Non starchy vegetables, 1/4 with carbohydrates food, 1/4 plate with protein.   Small portion meat and rare red meat.   Aim for 30 minutes of exercise daily   Your Weight today (03/08/2023) is 135 lb.    Expected Outcomes:  Demonstrated interest in learning. Expect positive outcomes  Education material provided:   If problems or questions, patient to contact team via:  Phone  Future DSME appointment: 3-4 months

## 2023-04-19 ENCOUNTER — Other Ambulatory Visit: Payer: Self-pay | Admitting: Internal Medicine

## 2023-06-04 ENCOUNTER — Other Ambulatory Visit: Payer: Self-pay | Admitting: Internal Medicine

## 2023-06-05 ENCOUNTER — Other Ambulatory Visit: Payer: Self-pay

## 2023-06-05 MED ORDER — METFORMIN HCL 500 MG PO TABS: ORAL_TABLET | ORAL | 5 refills | Status: DC

## 2023-06-07 ENCOUNTER — Other Ambulatory Visit: Payer: Self-pay | Admitting: Family Medicine

## 2023-06-20 ENCOUNTER — Encounter: Payer: Self-pay | Admitting: Family Medicine

## 2023-06-20 ENCOUNTER — Ambulatory Visit: Payer: No Typology Code available for payment source | Admitting: Family Medicine

## 2023-06-20 VITALS — BP 120/68 | HR 83 | Temp 98.4°F | Ht 61.2 in | Wt 129.6 lb

## 2023-06-20 DIAGNOSIS — Z7984 Long term (current) use of oral hypoglycemic drugs: Secondary | ICD-10-CM | POA: Diagnosis not present

## 2023-06-20 DIAGNOSIS — Z79899 Other long term (current) drug therapy: Secondary | ICD-10-CM | POA: Diagnosis not present

## 2023-06-20 DIAGNOSIS — I10 Essential (primary) hypertension: Secondary | ICD-10-CM | POA: Diagnosis not present

## 2023-06-20 DIAGNOSIS — I5022 Chronic systolic (congestive) heart failure: Secondary | ICD-10-CM

## 2023-06-20 DIAGNOSIS — N183 Chronic kidney disease, stage 3 unspecified: Secondary | ICD-10-CM | POA: Diagnosis not present

## 2023-06-20 DIAGNOSIS — E1122 Type 2 diabetes mellitus with diabetic chronic kidney disease: Secondary | ICD-10-CM

## 2023-06-20 DIAGNOSIS — E785 Hyperlipidemia, unspecified: Secondary | ICD-10-CM | POA: Diagnosis not present

## 2023-06-20 LAB — CBC WITH DIFFERENTIAL/PLATELET
Basophils Absolute: 0 10*3/uL (ref 0.0–0.1)
Basophils Relative: 0.8 % (ref 0.0–3.0)
Eosinophils Absolute: 0.1 10*3/uL (ref 0.0–0.7)
Eosinophils Relative: 2.6 % (ref 0.0–5.0)
HCT: 38.7 % (ref 36.0–46.0)
Hemoglobin: 12.3 g/dL (ref 12.0–15.0)
Lymphocytes Relative: 21.3 % (ref 12.0–46.0)
Lymphs Abs: 1 10*3/uL (ref 0.7–4.0)
MCHC: 31.8 g/dL (ref 30.0–36.0)
MCV: 89 fl (ref 78.0–100.0)
Monocytes Absolute: 0.3 10*3/uL (ref 0.1–1.0)
Monocytes Relative: 7.5 % (ref 3.0–12.0)
Neutro Abs: 3.1 10*3/uL (ref 1.4–7.7)
Neutrophils Relative %: 67.8 % (ref 43.0–77.0)
Platelets: 203 10*3/uL (ref 150.0–400.0)
RBC: 4.35 Mil/uL (ref 3.87–5.11)
RDW: 14.1 % (ref 11.5–15.5)
WBC: 4.6 10*3/uL (ref 4.0–10.5)

## 2023-06-20 LAB — VITAMIN B12: Vitamin B-12: 1295 pg/mL — ABNORMAL HIGH (ref 211–911)

## 2023-06-20 LAB — COMPREHENSIVE METABOLIC PANEL
ALT: 15 U/L (ref 0–35)
AST: 23 U/L (ref 0–37)
Albumin: 3.9 g/dL (ref 3.5–5.2)
Alkaline Phosphatase: 109 U/L (ref 39–117)
BUN: 42 mg/dL — ABNORMAL HIGH (ref 6–23)
CO2: 24 mEq/L (ref 19–32)
Calcium: 10 mg/dL (ref 8.4–10.5)
Chloride: 107 mEq/L (ref 96–112)
Creatinine, Ser: 1.8 mg/dL — ABNORMAL HIGH (ref 0.40–1.20)
GFR: 25.76 mL/min — ABNORMAL LOW (ref >60.00)
Glucose, Bld: 99 mg/dL (ref 70–99)
Potassium: 4.7 mEq/L (ref 3.5–5.1)
Sodium: 138 mEq/L (ref 135–145)
Total Bilirubin: 0.5 mg/dL (ref 0.2–1.2)
Total Protein: 6.9 g/dL (ref 6.0–8.3)

## 2023-06-20 LAB — LDL CHOLESTEROL, DIRECT: Direct LDL: 71 mg/dL

## 2023-06-20 LAB — HEMOGLOBIN A1C: Hgb A1c MFr Bld: 6.3 % (ref 4.6–6.5)

## 2023-06-20 MED ORDER — LANCET DEVICE MISC: 1.0000 | Freq: Three times a day (TID) | 0 refills | Status: AC

## 2023-06-20 MED ORDER — BLOOD GLUCOSE MONITORING SUPPL DEVI: 1.0000 | Freq: Three times a day (TID) | 0 refills | Status: AC

## 2023-06-20 MED ORDER — BLOOD GLUCOSE TEST VI STRP: 1.0000 | ORAL_STRIP | Freq: Three times a day (TID) | 0 refills | Status: DC

## 2023-06-20 MED ORDER — LANCETS MISC. MISC: 1.0000 | Freq: Three times a day (TID) | 0 refills | Status: AC

## 2023-06-20 NOTE — Patient Instructions (Addendum)
Get diabetic eye exam scheduled.  Let us know if/when you get your [facility] vaccine at the pharmacy.  Please stop by lab before you go If you have mychart- we will send your results within 3 business days of [place] receiving them.  If you do not have mychart- we will call you about results within 5 business days of [place] receiving them.  *please also note that you will see labs on mychart as soon as they post. I will later go in and write notes on them- will say "notes from Dr. Durene Cal"   Recommended follow up: Return in about 6 months (around 12/21/2023) for physical or sooner if needed.Schedule b4 you leave.

## 2023-06-20 NOTE — Progress Notes (Signed)
Phone [phone] In person visit   Subjective:   Maria [name] is a 82 y.o. year old very pleasant female patient who presents for/with See problem oriented charting Chief Complaint  Patient presents with   Medical Management of Chronic Issues    DEE not scheduled yet    Hyperlipidemia   Hypertension   Gas    Pt c/o gas build up over the weekend on her right side.    Past Medical History-  Patient Active Problem List   Diagnosis Date Noted   Chronic systolic CHF (congestive heart failure), NYHA class 3 (HCC)     Priority: High   NICM (nonischemic cardiomyopathy) (HCC)     Priority: High   Type 2 diabetes mellitus with chronic kidney disease (HCC) 04/16/2020    Priority: High   History of gastrointestinal hemorrhage     Priority: High   Fatty liver 01/07/2022    Priority: Medium    Microalbuminuria 12/17/2020    Priority: Medium    Aortic atherosclerosis (HCC) 06/18/2020    Priority: Medium    Chronic kidney disease (CKD), stage III (moderate) (HCC) 10/04/2019    Priority: Medium    GERD (gastroesophageal reflux disease) 05/27/2014    Priority: Medium    Hyperlipidemia LDL goal <100 08/21/2008    Priority: Medium    Essential hypertension 08/21/2008    Priority: Medium    PVC (premature ventricular contraction) 02/14/2022    Priority: Low   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 90 tablet 3   Calcium Carbonate-Vitamin D (CALCIUM PLUS VITAMIN D PO) Take 2 tablets by mouth daily.     cyanocobalamin 100 MCG tablet Take 100 mcg by mouth daily.      dapagliflozin propanediol (FARXIGA) 10 MG TABS tablet Take 10 mg by mouth daily.     furosemide (LASIX) 20 MG tablet Take 1 tablet (20 mg total) by mouth as needed. 30 tablet 3   metFORMIN (GLUCOPHAGE) 500 MG tablet TAKE 1 TABLET(500 MG) BY MOUTH DAILY WITH BREAKFAST 30 tablet 5   metoprolol (TOPROL XL) 200 MG 24 hr tablet Take 1 tablet (200 mg total) by mouth daily. 90 tablet 3   Multiple Vitamin (MULTI-DAY PO) Take 2 tablets by mouth daily. womens multivitiamin gummies 2 a day     Multiple Vitamins-Minerals (MEGAVITE FRUITS & VEGGIES PO) Take by mouth. Balance nature fruits and veggies     omeprazole (PRILOSEC) 20 MG capsule TAKE 1 CAPSULE(20 MG) BY MOUTH DAILY 90 capsule 1   sacubitril-valsartan (ENTRESTO) 97-103 MG Take 1 tablet by mouth 2 (two) times daily. 180 tablet 3   spironolactone (ALDACTONE) 25 MG tablet TAKE 1 TABLET(25 MG) BY MOUTH DAILY 90 tablet 1   triamcinolone cream (KENALOG) 0.1 % Apply 1 Application topically 2 (two) times daily. For 7-10 days maximum 80 g 0   No current facility-administered medications for this visit.     Objective:  BP 120/68   Pulse 83   Temp 98.4 F (36.9 C)   Ht 5' 1.2" (1.554 m)  Wt 129 lb 9.6 oz (58.8 kg)   SpO2 97%   BMI 24.33 kg/m  Gen: NAD, resting comfortably CV: RRR no murmurs rubs or gallops Lungs: CTAB no crackles, wheeze, rhonchi Abdomen: soft/nontender/nondistended/normal bowel sounds. No rebound or guarding.  Ext: no edema Skin: warm, dry  Diabetic Foot Exam - Simple   Simple Foot Form Diabetic Foot exam was performed with the following findings: Yes 06/20/2023  8:54 AM  Visual Inspection No deformities, no ulcerations, no other skin breakdown bilaterally: Yes Sensation Testing Intact to touch and monofilament testing bilaterally: Yes Pulse Check Posterior Tibialis and Dorsalis pulse intact bilaterally: Yes Comments        Assessment and Plan   # Gas? Right lateral hip pain S: started Friday with some right  lateral hip pain- still went to work but was really bothering her. By Saturday was still really bothering her- went to church that evening. Tried to take it easy over weekend- then into Sunday started radiating more into the right lower abdomen- started belching more and has gradually gone back down. Had good bowel movement and that helped as well.  A/P: not sure what was causing this issue but thankfully has resolved- on exam no pain today- she will monitor and follow up if worsening- I will check CBC and CMP as well   # Diabetes- new diagnosis 12/19/22 with 2nd a1c of 6.5 or above S: Medication: metformin 500mg  daily, also on farxiga 10mg  for CHF CBGs- not checking- would like to have meter -patient also had elevated microalbumin to creatinine ratio- improved on farxiga and entresto  Lab Results  Component Value Date   HGBA1C 6.5 12/19/2022   HGBA1C 6.2 06/29/2022   HGBA1C 6.2 12/17/2021  A/P: diabetes hopefully stable- update a1c today. Continue current meds for now  -so thankful microalbumin/creatinine ratio normalized- still with at least CKD III- GFR has been just under 30- may consider nephrology consult if doesn't stabilize -also if GFR still under 30 may need to stop metformin  # CHF- Dr. Izora Ribas cardiology- diagnosed 2023 S:Medication: farxiga 10 mg, lasix 20 mg daily prn, entresto 97-103 mg, spironolactone 25 mg, metoprolol 200 mg XR - has had  cath 02/25/22 with no evidence of CAD- remain off aspirin -weight continues to trend down, no swelling A/P: CHF euvolemic- continue current medications   #Essential hypertension/CKD stage III with elevated microalbumin to creatinine ratio S: Compliant with lasix 20 mg daily, metoprolol 200 mg XR, entresto 97-103 mg, spironolactone 25 mg -GFR ranging from high 30s to 50s since 2019- slightly worse after diuretics- just under 30 recently  - Patient had elevated microalbumin to creatinine ratio over 500 ratio November 11, 2020-we started  valsartan 80 mg daily- now on entresto. Improved on this and farxigo A/P: hypertension stable- continue current medicines  Chronic kidney disease III possible IV-  may have to stop the metformin as above- otherwise ocntrol blood pressure and continue current medications   #Hyperlipidemia aortic atherosclerosis S: Compliant with atorvastatin 80 mg - increased from 40 mg with LDL over 100 Lab Results  Component Value Date   CHOL 165 12/19/2022   HDL 45.80 12/19/2022   LDLCALC 109 (H) 12/19/2022   LDLDIRECT 100.0 07/23/2018   TRIG 51.0 12/19/2022   CHOLHDL 4 12/19/2022  A/P:  cholesterol hopefully better- she had a home screen and total was down to 128 so suspect it is better.    #GERD S: Compliant with omeprazole 20 mg.   -history of severe GI bleed years  ago so keeping her on PPI despite CKD III A/P:gastroesophageal reflux disease stable- continue current medicines  -takes B12 but update level   Recommended follow up: Return in about 6 months (around 12/21/2023) for physical or sooner if needed.Schedule b4 you leave. Future Appointments  Date Time Provider Department Center  07/25/2023  3:40 PM Christell Constant, MD [facility]  08/03/2023  9:00 AM Derrell Lolling Rolin Barry, RD [facility]  09/08/2023  9:00 AM [facility] ANNUAL WELLNESS VISIT 1 [facility]    Lab/Order associations:   ICD-10-CM   1. Type 2 diabetes mellitus with stage 3 chronic kidney disease, without long-term current use of insulin, unspecified whether stage 3a or 3b CKD (HCC)  E11.22    N18.30     2. Chronic systolic CHF (congestive heart failure), NYHA class 3 (HCC)  I50.22     3. Essential hypertension  I10     4. Hyperlipidemia LDL goal <100  E78.5     5. High risk medication use  Z79.899       No orders of the defined types were placed in this encounter.   Return precautions advised.  Tana Conch, MD

## 2023-06-21 ENCOUNTER — Other Ambulatory Visit: Payer: Self-pay

## 2023-06-21 DIAGNOSIS — N184 Chronic kidney disease, stage 4 (severe): Secondary | ICD-10-CM

## 2023-07-25 ENCOUNTER — Ambulatory Visit: Payer: No Typology Code available for payment source | Admitting: Internal Medicine

## 2023-07-25 NOTE — Progress Notes (Deleted)
Cardiology Office Note:    Date:  07/25/2023   ID:  Maria [name], DOB December 17, 1940, MRN [id number]  PCP:  Shelva Majestic, MD   [facility] Providers Cardiologist:  Christell Constant, MD     Referring MD: Shelva Majestic, MD   CC: Heart failure follow up  History of Present Illness:    Maria [name] is a 82 y.o. female with a hx of HTN, aortic atherosclerosis and HLD, CKD stage IIIa. 2022: Found to have LVEF 25%.   Started on [place], no CAD on 02/25/22 cath. RA 12, PCWP 19.  Increase Entresto.  Sent to Pharm D clinic for financial assistance.  After that lost to follow up. 2023: started on GDMT with improvement in LVEF ~ 40%  Patient notes that she is doing great.   Since last visit notes that she felt fantastic- she has not gained weight, her blood pressure has done great and she had no issues despite eating for  Thanksgiving, Christmas, and the New Years. There are no interval hospital/ED visit.    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  Went back yesterday.    Has had some coughing lately and feeds a cold was coming on.  It improved her symptoms.  No chest pain or pressure .  No SOB/DOE and no PND/Orthopnea.  No weight gain or leg swelling.  No palpitations or syncope .   Past Medical History:  Diagnosis Date   Arthritis    Bronchitis    hx of   Chronic kidney disease    Diabetes mellitus without complication (HCC)    GERD (gastroesophageal reflux disease)    History of bleeding ulcers    History of gastrointestinal hemorrhage    now off aspirin   Hyperlipidemia    Hypertension    Osteoarthritis of right knee 06/03/2012    Past Surgical History:  Procedure Laterality Date   ABDOMINAL HYSTERECTOMY     nonmalignant reasons   APPENDECTOMY     RIGHT/LEFT HEART CATH AND CORONARY ANGIOGRAPHY N/A 02/25/2022   Procedure: RIGHT/LEFT HEART CATH AND CORONARY ANGIOGRAPHY;  Surgeon: Kathleene Hazel, MD;  Location: [facility];   Service: Cardiovascular;  Laterality: N/A;   TOTAL KNEE ARTHROPLASTY  05/30/2012   Procedure: TOTAL KNEE ARTHROPLASTY;  Surgeon: Loreta Ave, MD;  Location: [facility] OR;  Service: Orthopedics;  Laterality: Right;  DR MURPHY WANTS 90 MINUTES FOR THIS CASE. 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   Procedure: LEFT TOTAL KNEE ARTHROPLASTY;  Surgeon: Loreta Ave, MD;  Location: [facility] OR;  Service: Orthopedics;  Laterality: Left;    Current Medications: No outpatient medications have been marked as taking for the 07/25/23 encounter (Appointment) with Christell Constant, MD.     Allergies:   Coumadin [warfarin sodium], Methocarbamol, and Norco [hydrocodone-acetaminophen]   Social History   Socioeconomic History   Marital status: Divorced    Spouse name: Not on file   Number of children: Not on file   Years of education: Not on file   Highest education level: Not on file  Occupational History   Occupation: day care   Tobacco Use   Smoking status: Never   Smokeless tobacco: Never  Substance and Sexual Activity   Alcohol use: No   Drug use: No   Sexual activity: Not on file  Other Topics Concern   Not on file  Social History Narrative   Lives alone. Great granddaughter stays  with her on weekends.    Divorced 32.       Still doing childcare   Retired from [employer]   Social Determinants of [employer]: Low Risk  (09/02/2022)   Overall Financial Resource Strain (CARDIA)    Difficulty of Paying Living Expenses: Not hard at all  Food Insecurity: No Food Insecurity (09/02/2022)   Hunger Vital Sign    Worried About Running Out of Food in the Last Year: Never true    Ran Out of Food in the Last Year: Never true  Transportation Needs: No Transportation Needs (09/02/2022)   PRAPARE - Administrator, Civil Service (Medical): No    Lack of Transportation (Non-Medical): No  Physical Activity: Sufficiently Active (09/02/2022)   Exercise  Vital Sign    Days of Exercise per Week: 5 days    Minutes of Exercise per Session: 60 min  Stress: No Stress Concern Present (09/02/2022)   [employer] of Occupational Health - Occupational Stress Questionnaire    Feeling of Stress : Not at all  Social Connections: Moderately Isolated (09/02/2022)   Social Connection and Isolation Panel [NHANES]    Frequency of Communication with Friends and Family: More than three times a week    Frequency of Social Gatherings with Friends and Family: More than three times a week    Attends Religious Services: More than 4 times per year    Active Member of [employer] or Organizations: No    Attends Banker Meetings: Never    Marital Status: Divorced    Social: Very Religious, has son; they go on vacation to [place] every year  Family History: The patient's family history includes Congestive Heart Failure in her mother; Other in her father.  ROS:   Please see the history of present illness.     All other systems reviewed and are negative.  EKGs/Labs/Other Studies Reviewed:    The following studies were reviewed today:  EKG:   02/14/22: Sinus tachycardia with frequent PVCs  Cardiac Studies & Procedures   CARDIAC CATHETERIZATION  CARDIAC CATHETERIZATION 02/25/2022  Narrative No angiographic evidence of CAD  RA 12 RV 57/0/14 PA 62/24 (mean 40) PCWP 19 LV 131/16/18 AO 119/59  Recommendations: Continue medical management of non-ischemic cardiomyopathy  Findings Coronary Findings Diagnostic  Dominance: Right  Left Anterior Descending Vessel is large.  Left Circumflex Vessel is large.  Right Coronary Artery Vessel is large.  Intervention  No interventions have been documented.     ECHOCARDIOGRAM  ECHOCARDIOGRAM COMPLETE 09/08/2022  Narrative ECHOCARDIOGRAM REPORT    Patient Name:   Maria [name] [facility] Date of Exam: 09/08/2022 Medical Rec #:  [id number]        Height:       61.0 in Accession #:     [id number]       Weight:       137.8 lb Date of Birth:  [birth year]         BSA:          1.612 m Patient Age:    81 years         BP:           120/68 mmHg Patient Gender: F                HR:           71 bpm. Exam Location:  [address]  Procedure: 2D Echo, 3D Echo, Cardiac Doppler, Color Doppler and  Strain Analysis  Indications:    I50.9 CHF  History:        Patient has prior history of Echocardiogram examinations, most recent 02/17/2022. Risk Factors:Hypertension and Dyslipidemia.  Sonographer:    Daphine Deutscher RDCS Referring Phys: [id number] [facility] A Rajendra Spiller  IMPRESSIONS   1. Left ventricular ejection fraction, by estimation, is 40 to 45%. The left ventricle has mildly decreased function. The left ventricle demonstrates regional wall motion abnormalities (see scoring diagram/findings for description). The mid-to-apical inferoseptal, mid-to-apical anteroseptal, and apical inferior LV segments appear severely hypokinetic. Left ventricular diastolic parameters are consistent with Grade I diastolic dysfunction (impaired relaxation). Overall, EF and wall motion significantly improved compared to prior TTE on 02/2022. 2. Right ventricular systolic function is mildly reduced. The right ventricular size is normal. Tricuspid regurgitation signal is inadequate for assessing PA pressure. 3. Left atrial size was mildly dilated. 4. The mitral valve is degenerative. Mild mitral valve regurgitation. 5. The aortic valve is tricuspid. There is mild calcification of the aortic valve. There is mild thickening of the aortic valve. Aortic valve regurgitation is not visualized. Aortic valve sclerosis/calcification is present, without any evidence of aortic stenosis. 6. The inferior vena cava is normal in size with greater than 50% respiratory variability, suggesting right atrial pressure of 3 mmHg.  Comparison(s): Compared to prior TTE on 02/2022, the EF has improved to 40-45% with improved  wall motion, the MR has decreased to mild (previously moderate to severe), the TR has decreased to trivial (previously moderate).  FINDINGS Left Ventricle: The mid-to-apical inferoseptal, mid-to-apical anteroseptal, and apical inferior LV segments appear severely hypokinetic. Left ventricular ejection fraction, by estimation, is 40 to 45%. The left ventricle has mildly decreased function. The left ventricle demonstrates regional wall motion abnormalities. Global longitudinal strain performed but not reported based on interpreter judgement due to suboptimal tracking. 3D left ventricular ejection fraction analysis performed but not reported based on interpreter judgement due to suboptimal tracking. The left ventricular internal cavity size was normal in size. There is no left ventricular hypertrophy. Left ventricular diastolic parameters are consistent with Grade I diastolic dysfunction (impaired relaxation).  Right Ventricle: The right ventricular size is normal. No increase in right ventricular wall thickness. Right ventricular systolic function is mildly reduced. Tricuspid regurgitation signal is inadequate for assessing PA pressure.  Left Atrium: Left atrial size was mildly dilated.  Right Atrium: Right atrial size was normal in size.  Pericardium: There is no evidence of pericardial effusion.  Mitral Valve: The mitral valve is degenerative in appearance. There is mild thickening of the mitral valve leaflet(s). There is mild calcification of the mitral valve leaflet(s). Mild to moderate mitral annular calcification. Mild mitral valve regurgitation.  Tricuspid Valve: The tricuspid valve is normal in structure. Tricuspid valve regurgitation is trivial.  Aortic Valve: The aortic valve is tricuspid. There is mild calcification of the aortic valve. There is mild thickening of the aortic valve. Aortic valve regurgitation is not visualized. Aortic valve sclerosis/calcification is present, without  any evidence of aortic stenosis.  Pulmonic Valve: The pulmonic valve was normal in structure. Pulmonic valve regurgitation is trivial.  Aorta: The aortic root and ascending aorta are structurally normal, with no evidence of dilitation.  Venous: The inferior vena cava is normal in size with greater than 50% respiratory variability, suggesting right atrial pressure of 3 mmHg.  IAS/Shunts: The atrial septum is grossly normal.   LEFT VENTRICLE PLAX 2D LVIDd:         4.50 cm   Diastology LVIDs:  3.10 cm   LV e' medial:    5.66 cm/s LV PW:         0.90 cm   LV E/e' medial:  10.8 LV IVS:        0.90 cm   LV e' lateral:   8.81 cm/s LVOT diam:     1.60 cm   LV E/e' lateral: 7.0 LV SV:         35 LV SV Index:   22        2D Longitudinal Strain LVOT Area:     2.01 cm  2D Strain GLS (A2C):   -20.3 % 2D Strain GLS (A3C):   -20.3 % 2D Strain GLS (A4C):   -21.2 % 2D Strain GLS Avg:     -20.6 %  3D Volume EF: 3D EF:        55 % LV EDV:       128 ml LV ESV:       57 ml LV SV:        71 ml  RIGHT VENTRICLE            IVC RV Basal diam:  3.10 cm    IVC diam: 1.50 cm RV S prime:     8.70 cm/s TAPSE (M-mode): 1.0 cm  LEFT ATRIUM             Index        RIGHT ATRIUM           Index LA diam:        4.10 cm 2.54 cm/m   RA Area:     10.70 cm LA Vol (A2C):   46.7 ml 28.97 ml/m  RA Volume:   24.50 ml  15.20 ml/m LA Vol (A4C):   36.2 ml 22.45 ml/m LA Biplane Vol: 42.1 ml 26.11 ml/m AORTIC VALVE LVOT Vmax:   77.80 cm/s LVOT Vmean:  51.350 cm/s LVOT VTI:    0.172 m  AORTA Ao Root diam: 2.50 cm Ao Asc diam:  2.80 cm  MITRAL VALVE MV Area (PHT): 3.37 cm    SHUNTS MV Decel Time: 225 msec    Systemic VTI:  0.17 m MV E velocity: 61.30 cm/s  Systemic Diam: 1.60 cm MV A velocity: 90.70 cm/s MV E/A ratio:  0.68  Laurance Flatten MD Electronically signed by Laurance Flatten MD Signature Date/Time: 09/08/2022/2:23:08 PM    Final    MONITORS  LONG TERM MONITOR (3-14 DAYS)  04/05/2022  Narrative  Patient had a minimum heart rate of 71 bpm, maximum heart rate of 240 bpm (NSVT) , and average heart rate of 97 bpm.  Predominant underlying rhythm was sinus rhythm.  Many runs of NSVT lasting 21 beats beats at longest. Some reported NSVT runs are artifact.  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  Isolated PACs were occasional (4.5%).  Isolated PVCs were occasional (4.5%).  No evidence of significant heart block .  No triggered and diary events.  Asymptomatic SVT and NSVT.               Recent Labs: 08/23/2022: NT-Pro BNP 3,640 06/20/2023: ALT 15; BUN 42; Creatinine, Ser 1.80; Hemoglobin 12.3; Platelets 203.0; Potassium 4.7; Sodium 138  Recent Lipid Panel    Component Value Date/Time   CHOL 165 12/19/2022 1046   CHOL 172 08/23/2022 1117   TRIG 51.0 12/19/2022 1046   HDL 45.80 12/19/2022 1046  HDL 57 08/23/2022 1117   CHOLHDL 4 12/19/2022 1046   VLDL 10.2 12/19/2022 1046   LDLCALC 109 (H) 12/19/2022 1046   LDLCALC 106 (H) 08/23/2022 1117   LDLCALC 89 11/11/2020 0939   LDLDIRECT 71.0 06/20/2023 0910        Physical Exam:    VS:  There were no vitals taken for this visit.    Wt Readings from Last 3 Encounters:  06/20/23 129 lb 9.6 oz (58.8 kg)  03/09/23 135 lb (61.2 kg)  01/30/23 136 lb (61.7 kg)    Gen: No distress   Cardiac: No rubs or gallops, regular rhythm  no murmur Respiratory: Occasional Rhonchi right lower lung field, normal effort, normal  respiratory rate GI: Soft, nontender, non-distended  MS: no edema;  moves all extremities Integument: Skin feels warm Neuro:  At time of evaluation, alert and oriented to person/place/time/situation  Psych: Normal affect, patient feels better  ASSESSMENT:    No diagnosis found.   PLAN:    HFrEF  - NYHA Class I Stage C, euvolemic Prior PVCs HTN CKD Stage IIIa - continue Entesto 97/103 - continue aldactone  25 mg - continue Farxiga 10 mg - Lasix as a PRN; gave education on when to restart - on metoprolol 200 mg PO daily  Aortic Athosclerosis and HLD - continue current statin; at next visit if still elevated LDL will increase to 80 mg   Me in July 2024 prior to her big [place] Trip ***       Medication Adjustments/Labs and Tests Ordered: Current medicines are reviewed at length with the patient today.  Concerns regarding medicines are outlined above.  No orders of the defined types were placed in this encounter.  No orders of the defined types were placed in this encounter.   There are no Patient Instructions on file for this visit.   Signed, Christell Constant, MD  07/25/2023 1:04 PM    [facility]

## 2023-08-03 ENCOUNTER — Ambulatory Visit: Payer: No Typology Code available for payment source | Admitting: Family Medicine

## 2023-08-03 ENCOUNTER — Encounter: Payer: No Typology Code available for payment source | Attending: Family Medicine | Admitting: Dietician

## 2023-08-03 ENCOUNTER — Telehealth: Payer: Self-pay | Admitting: Family Medicine

## 2023-08-03 ENCOUNTER — Encounter: Payer: Self-pay | Admitting: Dietician

## 2023-08-03 DIAGNOSIS — E119 Type 2 diabetes mellitus without complications: Secondary | ICD-10-CM | POA: Diagnosis not present

## 2023-08-03 NOTE — Progress Notes (Signed)
Patient is here today alone.  She was last seen by this RD on 02/05/2023.   Weight not taken today as patient was brought by front office in a wheel chair.  She is not feeling well today.  Blood sugar 90 and blood pressure 90/55 in the office and rechecked 100/56 after water. She is having diarrhea and has been having this for the past week. Has not eaten much in the past 24 hours. Her primary care physician's office was called and we spoke to a triage nurse.  Appointment made for her to see an MD at another [place] office today.  Unsure if patient went to this appointment.   Patient forgot her phone at home.  Referred diagnosis:  Type 2 diabetes and CKD   History includes:   type 2 diabetes, CKD, HTN, HLD. GERD. Medications include:  Metformin (d/c'd), Farxiga, Lasix, spironolactone, MVI, fruit and veggie gummie, vitamin B-12 Labs noted:  A1C 6.3% 06/20/2023 decreased from 6.5 % 12/19/22, BUN 30 , Creatinine 1.67 Potasium 4.7, GFR 28 12/19/2022 BUN 42, Creatinine 1.8, Potassium 4.7, GFR 25, vitamin B12 1,295 06/20/2023 on 06/20/2023  Weight hx: 129 lbs 06/20/2023 135 lbs 03/09/2023  Pt son lives with her.  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  24 hour recall: Breakfast:   6 oz OJ, 2 bites [place] sandwich Lunch:  1 bite [place] sandwich Dinner:  none Breakfast:  water, OJ, splenda sweetened lemonade  Plan: Follow up with MD regarding symptoms Patient to follow up with me in 3 months. Please call for questions.

## 2023-08-03 NOTE — Telephone Encounter (Signed)
Final Outcome: See HCP within 4 hours. Patient has been scheduled with Dr. Beverely Low for 11:20 am today.  Patient Name First: Maria Last: Quiocho Gender: Female DOB: Dec 28, 1940 Age: 82 Y 5 M 26 D Return Phone Number: [phone] (Primary) Address: City/ State/ Zip: [place]  [address] Client [facility] - Administrator, sports at [facility] Provider Tana Conch- MD Contact Type Call Who Is Calling Patient / Member / Family / Caregiver Call Type Triage / Clinical Caller Name Vernona Rieger Relationship To Patient Other Return Phone Number [phone] (Primary) Chief Complaint BLOOD PRESSURE LOW - Systolic (top number) 90 or less Reason for Call Symptomatic / Request for Health Information Initial Comment Caller states she is with the front office and she states the patient has diarrhea and GFR is 25 and BP 90/55. Translation No Nurse Assessment Nurse: Henri Medal, RN, Amy Date/Time (Eastern Time): 08/03/2023 10:06:48 AM Confirm and document reason for call. If symptomatic, describe symptoms. ---Caller states she is the dietician with the office. Pt. is with her now & c/o weakness & diarrhea the past week. She had to be wheeled down from the 4th floor office. She did not eat yesterday but did Tuesday, Couple bites sandwich & OJ. Diarrhea might be getting better. Started a week ago. Going about 3 times a day. 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 BP is 90/55 & HR 74. It was 120/68 in the office on the 16th. Had nurse recheck now & it is 100/56. Pt. states she is feeling better now since drinking some water. Does the patient have any new or worsening symptoms? ---Yes Will a triage be completed? ---Yes Related visit to physician within the last 2 weeks? ---No Does the PT have any chronic conditions? (i.e. diabetes, asthma, this includes High risk factors for pregnancy,  etc.) ---Yes List chronic conditions. ---HTN Is this a behavioral health or substance abuse call? ---No Guidelines Guideline Title Affirmed Question Affirmed Notes Nurse Date/Time (Eastern Time) Blood Pressure - Low [1] Systolic BP 90-110 AND [2] taking blood pressure medications AND [3] dizzy, lightheaded or weak Lovelace, RN, Amy 08/03/2023 10:09:13 AM Disp. Time Lamount Cohen Time) Disposition Final User 08/03/2023 10:04:18 AM Send to Urgent Queue Lonia Skinner 08/03/2023 10:29:20 AM See HCP within 4 Hours (or PCP triage) Yes Lovelace, RN, Amy Final Disposition 08/03/2023 10:29:20 AM See HCP within 4 Hours (or PCP triage) Yes Lovelace, RN, Amy Caller Disagree/Comply Comply Caller Understands Yes PreDisposition InappropriateToAsk Care Advice Given Per Guideline SEE HCP (OR PCP TRIAGE) WITHIN 4 HOURS: * IF OFFICE WILL BE OPEN: You need to be seen within the next 3 or 4 hours. Call your doctor (or NP/PA) now or as soon as the office opens. CARE ADVICE given per Low Blood Pressure (Adult) guideline.  Comments User: Lutricia Horsfall, RN Date/Time Lamount Cohen Time): 08/03/2023 10:30:04 AM Warm transferred caller to [facility] at the back line to make an appt. Referrals REFERRED TO PCP OFFICE

## 2023-08-03 NOTE — Telephone Encounter (Signed)
FYI

## 2023-08-03 NOTE — Patient Instructions (Signed)
[facility] at [facility] [address]) [place],  [place]  [address]  Main: [phone]  Your appointment is at 11:20 today It is on the right [place].

## 2023-08-03 NOTE — Telephone Encounter (Signed)
Can you check on her- don't see that she was seen today (I could be missing)

## 2023-08-03 NOTE — Telephone Encounter (Signed)
FYI: This call has been transferred to triage nurse: Access Nurse. Once the result note has been entered staff can address the message at that time.  Patient called in with the following symptoms:  Red Word: Low BP reading of 90/55, Diarrhea x 1 week, Blood sugar 90, and GFR 25 per [facility] office   Please advise at Mobile [phone] (mobile)  Message is routed to Provider Pool.

## 2023-08-04 NOTE — Telephone Encounter (Signed)
Patient called back and I asked her question below. Patient stated she didn't see Dr. Beverely Low since she was late due to accidentally passing the facility. Patient states that she adjusted her diet and went home and slept. States she is feeling much better and her diarrhea has improved greatly. She thanks [place] for checking on her and being concerned.

## 2023-08-04 NOTE — Telephone Encounter (Signed)
Reached out to pt and mailbox full.

## 2023-08-31 ENCOUNTER — Ambulatory Visit: Payer: No Typology Code available for payment source

## 2023-08-31 LAB — HM DIABETES EYE EXAM

## 2023-09-06 ENCOUNTER — Encounter: Payer: Self-pay | Admitting: Internal Medicine

## 2023-09-06 ENCOUNTER — Ambulatory Visit: Payer: No Typology Code available for payment source | Attending: Internal Medicine | Admitting: Internal Medicine

## 2023-09-06 VITALS — BP 130/56 | HR 84 | Ht 61.5 in | Wt 127.8 lb

## 2023-09-06 DIAGNOSIS — I428 Other cardiomyopathies: Secondary | ICD-10-CM | POA: Diagnosis not present

## 2023-09-06 DIAGNOSIS — I493 Ventricular premature depolarization: Secondary | ICD-10-CM

## 2023-09-06 DIAGNOSIS — I10 Essential (primary) hypertension: Secondary | ICD-10-CM

## 2023-09-06 DIAGNOSIS — I700 Atherosclerosis of aorta: Secondary | ICD-10-CM

## 2023-09-06 DIAGNOSIS — I5022 Chronic systolic (congestive) heart failure: Secondary | ICD-10-CM | POA: Diagnosis not present

## 2023-09-06 NOTE — Progress Notes (Addendum)
RA 12 RV 57/0/14 PA 62/24 (mean 40) PCWP 19 LV 131/16/18 AO 119/59  Recommendations: Continue medical management of non-ischemic cardiomyopathy  Findings Coronary Findings Diagnostic  Dominance: Right  Left Anterior Descending Vessel is large.  Left Circumflex Vessel is large.  Right Coronary Artery Vessel is large.  Intervention  No interventions have been documented.     ECHOCARDIOGRAM  ECHOCARDIOGRAM COMPLETE 09/08/2022  Narrative ECHOCARDIOGRAM REPORT    Patient Name:   Maria [name] [facility] Date of Exam: 09/08/2022 Medical Rec #:  [id number]        Height:       61.0 in Accession #:    [id number]       Weight:       137.8 lb Date of Birth:  January 25, 1941         BSA:          1.612 m Patient Age:    82 years         BP:           120/68 mmHg Patient Gender: F                HR:           71 bpm. Exam Location:  [address]  Procedure: 2D Echo, 3D Echo, Cardiac Doppler, Color Doppler and Strain Analysis  Indications:    I50.9 CHF  History:        Patient has prior history of Echocardiogram examinations, most recent 02/17/2022. Risk Factors:Hypertension and Dyslipidemia.  Sonographer:    Daphine Deutscher RDCS Referring Phys: [id number] [facility]  Verna Hamon  IMPRESSIONS   1. Left ventricular ejection fraction, by estimation, is 40 to 45%. The left ventricle has mildly decreased function. The left ventricle demonstrates regional wall motion abnormalities (see scoring diagram/findings for description). The mid-to-apical inferoseptal, mid-to-apical anteroseptal, and apical inferior LV segments appear severely hypokinetic. Left ventricular diastolic parameters are consistent with Grade I diastolic dysfunction (impaired relaxation). Overall, EF and wall motion significantly improved compared to prior TTE on 02/2022. 2. Right ventricular systolic function is mildly reduced. The right ventricular size is normal. Tricuspid regurgitation signal is inadequate for assessing PA pressure. 3. Left atrial size was mildly dilated. 4. The mitral valve is degenerative. Mild mitral valve regurgitation. 5. The aortic valve is tricuspid. There is mild calcification of the aortic valve. There is mild thickening of the aortic valve. Aortic valve regurgitation is not visualized. Aortic valve sclerosis/calcification is present, without any evidence of aortic stenosis. 6. The inferior vena cava is normal in size with greater than 50% respiratory variability, suggesting right atrial pressure of 3 mmHg.  Comparison(s): Compared to prior TTE on 02/2022, the EF has improved to 40-45% with improved wall motion, the MR has decreased to mild (previously moderate to severe), the TR has decreased to trivial (previously moderate).  FINDINGS Left Ventricle: The mid-to-apical inferoseptal, mid-to-apical anteroseptal, and apical inferior LV segments appear severely hypokinetic. Left ventricular ejection fraction, by estimation, is 40 to 45%. The left ventricle has mildly decreased function. The left ventricle demonstrates regional wall motion abnormalities. Global longitudinal strain performed but not reported based on interpreter judgement due to suboptimal tracking. 3D  left ventricular ejection fraction analysis performed but not reported based on interpreter judgement due to suboptimal tracking. The left ventricular internal cavity size was normal in size. There is no left ventricular hypertrophy. Left ventricular diastolic parameters are consistent with Grade I diastolic dysfunction (impaired relaxation).  Right Ventricle: The right ventricular size is normal. No  Cardiology Office Note:    Date:  09/06/2023   ID:  EVAGELIA KNACK, DOB Sep 15, 1941, MRN [id number]  PCP:  Shelva Majestic, MD   [facility] Providers Cardiologist:  Christell Constant, MD     Referring MD: Shelva Majestic, MD   CC: Heart failure follow up  History of Present Illness:    Maria [name] is a 82 y.o. female with a hx of HTN, aortic atherosclerosis and HLD, CKD stage IIIa. 2022: Found to have LVEF 25%.   Started on Wal[place], no CAD on 02/25/22 cath. RA 12, PCWP 19.  Increase Entresto.  Sent to Pharm D clinic for financial assistance.  After that lost to follow up. 2023: started on GDMT with improvement in LVEF ~ 40% 2024: Back at Child care; she was NYHA class I; had a death in the family  Discussed the use of AI scribe software for clinical note transcription with the patient, who gave verbal consent to proceed.  History of Present Illness         Maria [name], an 82 year old female with a history of heart failure, presents for a routine follow-up. She reports doing well on her current medication regimen, which includes Lasix (as needed), spironolactone, Entresto, Farxiga, and atorvastatin. She has not needed Lasix recently, indicating good control of her heart failure symptoms. She mentions an interest in a natural supplement for diabetes that she saw advertised on Facebook, but has not started taking it. She also reports occasional coldness and discoloration in her hands, which she describes as looking "gray" and feeling stiff.   Past Medical History:  Diagnosis Date   Arthritis    Bronchitis    hx of   Chronic kidney disease    Diabetes mellitus without complication (HCC)    GERD (gastroesophageal reflux disease)    History of bleeding ulcers    History of gastrointestinal hemorrhage    now off aspirin   Hyperlipidemia    Hypertension    Osteoarthritis of right knee 06/03/2012    Past Surgical History:  Procedure Laterality Date    ABDOMINAL HYSTERECTOMY     nonmalignant reasons   APPENDECTOMY     RIGHT/LEFT HEART CATH AND CORONARY ANGIOGRAPHY N/A 02/25/2022   Procedure: RIGHT/LEFT HEART CATH AND CORONARY ANGIOGRAPHY;  Surgeon: Kathleene Hazel, MD;  Location: [facility];  Service: Cardiovascular;  Laterality: N/A;   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   Blood Glucose Monitoring Suppl DEVI 1 each by Does not apply route in the morning, at noon, and at bedtime. May substitute to any manufacturer covered by patient's insurance.   Calcium Carbonate-Vitamin D (CALCIUM PLUS VITAMIN D PO) Take 2 tablets by mouth daily.   cyanocobalamin 100 MCG tablet Take 100 mcg by mouth daily.   dapagliflozin propanediol (FARXIGA) 10 MG TABS tablet Take 10 mg by mouth daily.   furosemide (LASIX) 20 MG tablet Take 1 tablet (20 mg total) by mouth as needed.   metoprolol (TOPROL  Cardiology Office Note:    Date:  09/06/2023   ID:  EVAGELIA KNACK, DOB Sep 15, 1941, MRN [id number]  PCP:  Shelva Majestic, MD   [facility] Providers Cardiologist:  Christell Constant, MD     Referring MD: Shelva Majestic, MD   CC: Heart failure follow up  History of Present Illness:    Maria [name] is a 82 y.o. female with a hx of HTN, aortic atherosclerosis and HLD, CKD stage IIIa. 2022: Found to have LVEF 25%.   Started on Wal[place], no CAD on 02/25/22 cath. RA 12, PCWP 19.  Increase Entresto.  Sent to Pharm D clinic for financial assistance.  After that lost to follow up. 2023: started on GDMT with improvement in LVEF ~ 40% 2024: Back at Child care; she was NYHA class I; had a death in the family  Discussed the use of AI scribe software for clinical note transcription with the patient, who gave verbal consent to proceed.  History of Present Illness         Maria [name], an 82 year old female with a history of heart failure, presents for a routine follow-up. She reports doing well on her current medication regimen, which includes Lasix (as needed), spironolactone, Entresto, Farxiga, and atorvastatin. She has not needed Lasix recently, indicating good control of her heart failure symptoms. She mentions an interest in a natural supplement for diabetes that she saw advertised on Facebook, but has not started taking it. She also reports occasional coldness and discoloration in her hands, which she describes as looking "gray" and feeling stiff.   Past Medical History:  Diagnosis Date   Arthritis    Bronchitis    hx of   Chronic kidney disease    Diabetes mellitus without complication (HCC)    GERD (gastroesophageal reflux disease)    History of bleeding ulcers    History of gastrointestinal hemorrhage    now off aspirin   Hyperlipidemia    Hypertension    Osteoarthritis of right knee 06/03/2012    Past Surgical History:  Procedure Laterality Date    ABDOMINAL HYSTERECTOMY     nonmalignant reasons   APPENDECTOMY     RIGHT/LEFT HEART CATH AND CORONARY ANGIOGRAPHY N/A 02/25/2022   Procedure: RIGHT/LEFT HEART CATH AND CORONARY ANGIOGRAPHY;  Surgeon: Kathleene Hazel, MD;  Location: [facility];  Service: Cardiovascular;  Laterality: N/A;   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   Blood Glucose Monitoring Suppl DEVI 1 each by Does not apply route in the morning, at noon, and at bedtime. May substitute to any manufacturer covered by patient's insurance.   Calcium Carbonate-Vitamin D (CALCIUM PLUS VITAMIN D PO) Take 2 tablets by mouth daily.   cyanocobalamin 100 MCG tablet Take 100 mcg by mouth daily.   dapagliflozin propanediol (FARXIGA) 10 MG TABS tablet Take 10 mg by mouth daily.   furosemide (LASIX) 20 MG tablet Take 1 tablet (20 mg total) by mouth as needed.   metoprolol (TOPROL  Cardiology Office Note:    Date:  09/06/2023   ID:  EVAGELIA KNACK, DOB Sep 15, 1941, MRN [id number]  PCP:  Shelva Majestic, MD   [facility] Providers Cardiologist:  Christell Constant, MD     Referring MD: Shelva Majestic, MD   CC: Heart failure follow up  History of Present Illness:    Maria [name] is a 82 y.o. female with a hx of HTN, aortic atherosclerosis and HLD, CKD stage IIIa. 2022: Found to have LVEF 25%.   Started on Wal[place], no CAD on 02/25/22 cath. RA 12, PCWP 19.  Increase Entresto.  Sent to Pharm D clinic for financial assistance.  After that lost to follow up. 2023: started on GDMT with improvement in LVEF ~ 40% 2024: Back at Child care; she was NYHA class I; had a death in the family  Discussed the use of AI scribe software for clinical note transcription with the patient, who gave verbal consent to proceed.  History of Present Illness         Maria [name], an 82 year old female with a history of heart failure, presents for a routine follow-up. She reports doing well on her current medication regimen, which includes Lasix (as needed), spironolactone, Entresto, Farxiga, and atorvastatin. She has not needed Lasix recently, indicating good control of her heart failure symptoms. She mentions an interest in a natural supplement for diabetes that she saw advertised on Facebook, but has not started taking it. She also reports occasional coldness and discoloration in her hands, which she describes as looking "gray" and feeling stiff.   Past Medical History:  Diagnosis Date   Arthritis    Bronchitis    hx of   Chronic kidney disease    Diabetes mellitus without complication (HCC)    GERD (gastroesophageal reflux disease)    History of bleeding ulcers    History of gastrointestinal hemorrhage    now off aspirin   Hyperlipidemia    Hypertension    Osteoarthritis of right knee 06/03/2012    Past Surgical History:  Procedure Laterality Date    ABDOMINAL HYSTERECTOMY     nonmalignant reasons   APPENDECTOMY     RIGHT/LEFT HEART CATH AND CORONARY ANGIOGRAPHY N/A 02/25/2022   Procedure: RIGHT/LEFT HEART CATH AND CORONARY ANGIOGRAPHY;  Surgeon: Kathleene Hazel, MD;  Location: [facility];  Service: Cardiovascular;  Laterality: N/A;   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   Blood Glucose Monitoring Suppl DEVI 1 each by Does not apply route in the morning, at noon, and at bedtime. May substitute to any manufacturer covered by patient's insurance.   Calcium Carbonate-Vitamin D (CALCIUM PLUS VITAMIN D PO) Take 2 tablets by mouth daily.   cyanocobalamin 100 MCG tablet Take 100 mcg by mouth daily.   dapagliflozin propanediol (FARXIGA) 10 MG TABS tablet Take 10 mg by mouth daily.   furosemide (LASIX) 20 MG tablet Take 1 tablet (20 mg total) by mouth as needed.   metoprolol (TOPROL  RA 12 RV 57/0/14 PA 62/24 (mean 40) PCWP 19 LV 131/16/18 AO 119/59  Recommendations: Continue medical management of non-ischemic cardiomyopathy  Findings Coronary Findings Diagnostic  Dominance: Right  Left Anterior Descending Vessel is large.  Left Circumflex Vessel is large.  Right Coronary Artery Vessel is large.  Intervention  No interventions have been documented.     ECHOCARDIOGRAM  ECHOCARDIOGRAM COMPLETE 09/08/2022  Narrative ECHOCARDIOGRAM REPORT    Patient Name:   Maria [name] [facility] Date of Exam: 09/08/2022 Medical Rec #:  [id number]        Height:       61.0 in Accession #:    [id number]       Weight:       137.8 lb Date of Birth:  January 25, 1941         BSA:          1.612 m Patient Age:    82 years         BP:           120/68 mmHg Patient Gender: F                HR:           71 bpm. Exam Location:  [address]  Procedure: 2D Echo, 3D Echo, Cardiac Doppler, Color Doppler and Strain Analysis  Indications:    I50.9 CHF  History:        Patient has prior history of Echocardiogram examinations, most recent 02/17/2022. Risk Factors:Hypertension and Dyslipidemia.  Sonographer:    Daphine Deutscher RDCS Referring Phys: [id number] [facility]  Verna Hamon  IMPRESSIONS   1. Left ventricular ejection fraction, by estimation, is 40 to 45%. The left ventricle has mildly decreased function. The left ventricle demonstrates regional wall motion abnormalities (see scoring diagram/findings for description). The mid-to-apical inferoseptal, mid-to-apical anteroseptal, and apical inferior LV segments appear severely hypokinetic. Left ventricular diastolic parameters are consistent with Grade I diastolic dysfunction (impaired relaxation). Overall, EF and wall motion significantly improved compared to prior TTE on 02/2022. 2. Right ventricular systolic function is mildly reduced. The right ventricular size is normal. Tricuspid regurgitation signal is inadequate for assessing PA pressure. 3. Left atrial size was mildly dilated. 4. The mitral valve is degenerative. Mild mitral valve regurgitation. 5. The aortic valve is tricuspid. There is mild calcification of the aortic valve. There is mild thickening of the aortic valve. Aortic valve regurgitation is not visualized. Aortic valve sclerosis/calcification is present, without any evidence of aortic stenosis. 6. The inferior vena cava is normal in size with greater than 50% respiratory variability, suggesting right atrial pressure of 3 mmHg.  Comparison(s): Compared to prior TTE on 02/2022, the EF has improved to 40-45% with improved wall motion, the MR has decreased to mild (previously moderate to severe), the TR has decreased to trivial (previously moderate).  FINDINGS Left Ventricle: The mid-to-apical inferoseptal, mid-to-apical anteroseptal, and apical inferior LV segments appear severely hypokinetic. Left ventricular ejection fraction, by estimation, is 40 to 45%. The left ventricle has mildly decreased function. The left ventricle demonstrates regional wall motion abnormalities. Global longitudinal strain performed but not reported based on interpreter judgement due to suboptimal tracking. 3D  left ventricular ejection fraction analysis performed but not reported based on interpreter judgement due to suboptimal tracking. The left ventricular internal cavity size was normal in size. There is no left ventricular hypertrophy. Left ventricular diastolic parameters are consistent with Grade I diastolic dysfunction (impaired relaxation).  Right Ventricle: The right ventricular size is normal. No  RA 12 RV 57/0/14 PA 62/24 (mean 40) PCWP 19 LV 131/16/18 AO 119/59  Recommendations: Continue medical management of non-ischemic cardiomyopathy  Findings Coronary Findings Diagnostic  Dominance: Right  Left Anterior Descending Vessel is large.  Left Circumflex Vessel is large.  Right Coronary Artery Vessel is large.  Intervention  No interventions have been documented.     ECHOCARDIOGRAM  ECHOCARDIOGRAM COMPLETE 09/08/2022  Narrative ECHOCARDIOGRAM REPORT    Patient Name:   Maria [name] [facility] Date of Exam: 09/08/2022 Medical Rec #:  [id number]        Height:       61.0 in Accession #:    [id number]       Weight:       137.8 lb Date of Birth:  January 25, 1941         BSA:          1.612 m Patient Age:    82 years         BP:           120/68 mmHg Patient Gender: F                HR:           71 bpm. Exam Location:  [address]  Procedure: 2D Echo, 3D Echo, Cardiac Doppler, Color Doppler and Strain Analysis  Indications:    I50.9 CHF  History:        Patient has prior history of Echocardiogram examinations, most recent 02/17/2022. Risk Factors:Hypertension and Dyslipidemia.  Sonographer:    Daphine Deutscher RDCS Referring Phys: [id number] [facility]  Verna Hamon  IMPRESSIONS   1. Left ventricular ejection fraction, by estimation, is 40 to 45%. The left ventricle has mildly decreased function. The left ventricle demonstrates regional wall motion abnormalities (see scoring diagram/findings for description). The mid-to-apical inferoseptal, mid-to-apical anteroseptal, and apical inferior LV segments appear severely hypokinetic. Left ventricular diastolic parameters are consistent with Grade I diastolic dysfunction (impaired relaxation). Overall, EF and wall motion significantly improved compared to prior TTE on 02/2022. 2. Right ventricular systolic function is mildly reduced. The right ventricular size is normal. Tricuspid regurgitation signal is inadequate for assessing PA pressure. 3. Left atrial size was mildly dilated. 4. The mitral valve is degenerative. Mild mitral valve regurgitation. 5. The aortic valve is tricuspid. There is mild calcification of the aortic valve. There is mild thickening of the aortic valve. Aortic valve regurgitation is not visualized. Aortic valve sclerosis/calcification is present, without any evidence of aortic stenosis. 6. The inferior vena cava is normal in size with greater than 50% respiratory variability, suggesting right atrial pressure of 3 mmHg.  Comparison(s): Compared to prior TTE on 02/2022, the EF has improved to 40-45% with improved wall motion, the MR has decreased to mild (previously moderate to severe), the TR has decreased to trivial (previously moderate).  FINDINGS Left Ventricle: The mid-to-apical inferoseptal, mid-to-apical anteroseptal, and apical inferior LV segments appear severely hypokinetic. Left ventricular ejection fraction, by estimation, is 40 to 45%. The left ventricle has mildly decreased function. The left ventricle demonstrates regional wall motion abnormalities. Global longitudinal strain performed but not reported based on interpreter judgement due to suboptimal tracking. 3D  left ventricular ejection fraction analysis performed but not reported based on interpreter judgement due to suboptimal tracking. The left ventricular internal cavity size was normal in size. There is no left ventricular hypertrophy. Left ventricular diastolic parameters are consistent with Grade I diastolic dysfunction (impaired relaxation).  Right Ventricle: The right ventricular size is normal. No

## 2023-09-06 NOTE — Patient Instructions (Addendum)
Medication Instructions:  Your physician recommends that you continue on your current medications as directed. Please refer to the Current Medication list given to you today.  *If you need a refill on your cardiac medications before your next appointment, please call your pharmacy*   Lab Work: NONE  If you have labs (blood work) drawn today and your tests are completely normal, you will receive your results only by: MyChart Message (if you have MyChart) OR A paper copy in the mail If you have any lab test that is abnormal or we need to change your treatment, we will call you to review the results.   Testing/Procedures: NONE   Follow-Up: At [facility], you and your health needs are our priority.  As part of our continuing mission to provide you with exceptional heart care, we have created designated Provider Care Teams.  These Care Teams include your primary Cardiologist (physician) and Advanced Practice Providers (APPs -  Physician Assistants and Nurse Practitioners) who all work together to provide you with the care you need, when you need it.  We recommend signing up for the patient portal called "MyChart".  Sign up information is provided on this After Visit Summary.  MyChart is used to connect with patients for Virtual Visits (Telemedicine).  Patients are able to view lab/test results, encounter notes, upcoming appointments, etc.  Non-urgent messages can be sent to your provider as well.   To learn more about what you can do with MyChart, go to [email or web address].    Your next appointment:   9 months   Provider:   Riley Lam, MD

## 2023-09-12 ENCOUNTER — Ambulatory Visit (INDEPENDENT_AMBULATORY_CARE_PROVIDER_SITE_OTHER): Payer: No Typology Code available for payment source

## 2023-09-12 VITALS — BP 132/68 | HR 76 | Temp 98.5°F | Wt 127.6 lb

## 2023-09-12 DIAGNOSIS — Z0000 Encounter for general adult medical examination without abnormal findings: Secondary | ICD-10-CM

## 2023-09-12 DIAGNOSIS — Z23 Encounter for immunization: Secondary | ICD-10-CM | POA: Diagnosis not present

## 2023-09-12 NOTE — Progress Notes (Signed)
Subjective:   Maria [name] is a 82 y.o. female who presents for Medicare Annual (Subsequent) preventive examination.  Visit Complete: In person    Cardiac Risk Factors include: advanced age (>74men, >57 women);diabetes mellitus;dyslipidemia;hypertension     Objective:    Today's Vitals   09/12/23 0805  BP: 132/68  Pulse: 76  Temp: 98.5 F (36.9 C)  SpO2: 93%  Weight: 127 lb 9.6 oz (57.9 kg)   Body mass index is 23.72 kg/m.     09/12/2023    8:14 AM 01/30/2023    8:16 AM 09/02/2022    8:48 AM 02/25/2022    7:58 AM 08/23/2021    8:14 AM 07/20/2020    2:58 PM 07/17/2019    8:57 AM  Advanced Directives  Does Patient Have a Medical Advance Directive? No No Yes No Yes Yes Yes  Type of Surveyor, minerals;Living will   Healthcare Power of [place];Living will Healthcare Power of [place];Living will  Does patient want to make changes to medical advance directive?     Yes (MAU/Ambulatory/Procedural Areas - Information given)  No - Patient declined  Copy of Healthcare Power of Attorney in Chart?   No - copy requested   No - copy requested No - copy requested  Would patient like information on creating a medical advance directive? No - Patient declined Yes (MAU/Ambulatory/Procedural Areas - Information given)  No - Patient declined       Current Medications (verified) Outpatient Encounter Medications as of 09/12/2023  Medication Sig   atorvastatin (LIPITOR) 80 MG tablet Take 1 tablet (80 mg total) by mouth daily.   Blood Glucose Monitoring Suppl DEVI 1 each by Does not apply route in the morning, at noon, and at bedtime. May substitute to any manufacturer covered by patient's insurance.   Calcium Carbonate-Vitamin D (CALCIUM PLUS VITAMIN D PO) Take 2 tablets by mouth daily.   cyanocobalamin 100 MCG tablet Take 100 mcg by mouth daily.   dapagliflozin propanediol (FARXIGA) 10 MG TABS tablet Take 10 mg by mouth daily.   Multiple Vitamin (MULTI-DAY PO)  Take 2 tablets by mouth daily. womens multivitiamin gummies 2 a day   Multiple Vitamins-Minerals (MEGAVITE FRUITS & VEGGIES PO) Take by mouth. Balance nature fruits and veggies   omeprazole (PRILOSEC) 20 MG capsule TAKE 1 CAPSULE(20 MG) BY MOUTH DAILY   sacubitril-valsartan (ENTRESTO) 97-103 MG Take 1 tablet by mouth 2 (two) times daily.   spironolactone (ALDACTONE) 25 MG tablet TAKE 1 TABLET(25 MG) BY MOUTH DAILY   triamcinolone cream (KENALOG) 0.1 % Apply 1 Application topically 2 (two) times daily. For 7-10 days maximum   furosemide (LASIX) 20 MG tablet Take 1 tablet (20 mg total) by mouth as needed. (Patient not taking: Reported on 09/12/2023)   metoprolol (TOPROL XL) 200 MG 24 hr tablet Take 1 tablet (200 mg total) by mouth daily. (Patient not taking: Reported on 09/12/2023)   No facility-administered encounter medications on file as of 09/12/2023.    Allergies (verified) Coumadin [warfarin sodium], Methocarbamol, and Norco [hydrocodone-acetaminophen]   History: Past Medical History:  Diagnosis Date   Arthritis    Bronchitis    hx of   Chronic kidney disease    Diabetes mellitus without complication (HCC)    GERD (gastroesophageal reflux disease)    History of bleeding ulcers    History of gastrointestinal hemorrhage    now off aspirin   Hyperlipidemia    Hypertension    Osteoarthritis of right knee  06/03/2012   Past Surgical History:  Procedure Laterality Date   ABDOMINAL HYSTERECTOMY     nonmalignant reasons   APPENDECTOMY     RIGHT/LEFT HEART CATH AND CORONARY ANGIOGRAPHY N/A 02/25/2022   Procedure: RIGHT/LEFT HEART CATH AND CORONARY ANGIOGRAPHY;  Surgeon: Kathleene Hazel, MD;  Location[facility];  Service: Cardiovascular;  Laterality: N/A;   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   Procedure: LEFT TOTAL KNEE ARTHROPLASTY;  Surgeon: Loreta Ave, MD;  Location: [facility] OR;  Service: Orthopedics;  Laterality: Left;   Family History  Problem Relation Age of Onset   Congestive Heart Failure Mother        80, only child   Other Father        was not in patient life   Social History   Socioeconomic History   Marital status: Divorced    Spouse name: Not on file   Number of children: Not on file   Years of education: Not on file   Highest education level: Not on file  Occupational History   Occupation: day care   Tobacco Use   Smoking status: Never   Smokeless tobacco: Never  Substance and Sexual Activity   Alcohol use: No   Drug use: No   Sexual activity: Not on file  Other Topics Concern   Not on file  Social History Narrative   Lives alone. Great granddaughter stays with her on weekends.    Divorced 62.       Still doing childcare   Retired from [employer]   Social Determinants of [employer]: Low Risk  (09/12/2023)   Overall Financial Resource Strain (CARDIA)    Difficulty of Paying Living Expenses: Not hard at all  Food Insecurity: No Food Insecurity (09/12/2023)   Hunger Vital Sign    Worried About Running Out of Food in the Last Year: Never true    Ran Out of Food in the Last Year: Never true  Transportation Needs: No Transportation Needs (09/12/2023)   PRAPARE - Administrator, Civil Service (Medical): No    Lack of Transportation (Non-Medical): No  Physical Activity: Sufficiently Active (09/12/2023)   Exercise Vital Sign    Days of Exercise per Week: 5 days    Minutes of Exercise per Session: 60 min  Stress: No Stress Concern Present (09/12/2023)   [employer] of Occupational Health - Occupational Stress Questionnaire    Feeling of Stress : Not at all  Social Connections: Moderately Integrated (09/12/2023)   Social Connection and Isolation Panel [NHANES]     Frequency of Communication with Friends and Family: More than three times a week    Frequency of Social Gatherings with Friends and Family: More than three times a week    Attends Religious Services: More than 4 times per year    Active Member of [employer] or Organizations: Yes    Attends Banker Meetings: 1 to 4 times per year    Marital Status: Divorced    Tobacco Counseling Counseling given: Not Answered   Clinical Intake:  Pre-visit preparation completed: Yes  Pain : No/denies pain     BMI - recorded: 23.72 Nutritional Status: BMI of 19-24  Normal Nutritional Risks: None Diabetes:  Yes CBG done?: No Did pt. bring in CBG monitor from home?: No  How often do you need to have someone help you when you read instructions, pamphlets, or other written materials from your doctor or pharmacy?: 1 - Never  Interpreter Needed?: No  Information entered by :: Lanier Ensign, LPN   Activities of Daily Living    09/12/2023    8:07 AM  In your present state of health, do you have any difficulty performing the following activities:  Hearing? 0  Vision? 0  Difficulty concentrating or making decisions? 0  Walking or climbing stairs? 0  Dressing or bathing? 0  Doing errands, shopping? 0  Preparing Food and eating ? N  Using the Toilet? N  In the past six months, have you accidently leaked urine? N  Do you have problems with loss of bowel control? N  Managing your Medications? N  Managing your Finances? N  Housekeeping or managing your Housekeeping? N    Patient Care Team: Shelva Majestic, MD as PCP - General (Family Medicine) Christell Constant, MD as PCP - Cardiology (Cardiology) Augustin Schooling, MD as Consulting Physician (Ophthalmology) Sheral Apley, MD as Consulting Physician (Orthopedic Surgery) Dahlia Byes, [facility] (Inactive) as Pharmacist (Pharmacist)  Indicate any recent Medical Services you may have received from other than [facility] providers in the  past year (date may be approximate).     Assessment:   This is a routine wellness examination for Unnamed.  Hearing/Vision screen Hearing Screening - Comments:: Pt denies any hearing issues  Vision Screening - Comments:: Encouraged to follow up with a new provider    Goals Addressed             This Visit's Progress    Patient Stated       Maintain health and activity        Depression Screen    09/12/2023    8:12 AM 06/20/2023    9:08 AM 01/30/2023    8:16 AM 09/02/2022    8:47 AM 12/17/2021    9:16 AM 08/23/2021    8:12 AM 06/04/2021    8:51 AM  PHQ 2/9 Scores  PHQ - 2 Score 0 0 0 0 0 0 0  PHQ- 9 Score  1   0      Fall Risk    09/12/2023    8:15 AM 06/20/2023    9:08 AM 01/30/2023    8:15 AM 12/19/2022   10:01 AM 09/02/2022    8:50 AM  Fall Risk   Falls in the past year? 0 0 0 0 0  Number falls in past yr: 0 0  0 0  Injury with Fall? 0 0  0 0  Risk for fall due to : Impaired vision No Fall Risks  No Fall Risks   Follow up Falls prevention discussed Falls evaluation completed  Falls evaluation completed Falls prevention discussed    MEDICARE RISK AT HOME: Medicare Risk at Home Any stairs in or around the home?: Yes If so, are there any without handrails?: No Home free of loose throw rugs in walkways, pet beds, electrical cords, etc?: Yes Adequate lighting in your home to reduce risk of falls?: Yes Life alert?: No Use of a cane, walker or w/c?: No Grab bars in the bathroom?: No Shower chair or bench in shower?: Yes Elevated toilet seat or a handicapped toilet?: No  TIMED UP AND GO:  Was the test performed?  Yes  Length of time to  ambulate 10 feet: 10 sec Gait steady and fast without use of assistive device    Cognitive Function:    04/26/2018    9:33 AM 10/13/2016   10:13 AM  MMSE - Mini Mental State Exam  Not completed: -- --        09/12/2023    8:16 AM 08/23/2021    8:18 AM 07/17/2019    8:51 AM 04/26/2018    9:34 AM  6CIT Screen  What Year? 0  points 0 points 0 points 0 points  What month? 0 points 0 points 0 points 0 points  What time? 0 points 0 points 0 points 0 points  Count back from 20 0 points 0 points 0 points 0 points  Months in reverse 0 points 0 points 0 points 0 points  Repeat phrase 0 points 0 points 0 points 0 points  Total Score 0 points 0 points 0 points 0 points    Immunizations Immunization History  Administered Date(s) Administered   Fluad Quad(high Dose 65+) 10/03/2019, 09/23/2020, 09/08/2022   Influenza Split 09/08/2011, 10/29/2012   Influenza Whole 08/21/2008, 09/06/2010   Influenza, High Dose Seasonal PF 09/29/2016, 08/23/2018   Influenza,inj,Quad PF,6+ Mos 11/07/2013   PFIZER(Purple Top)SARS-COV-2 Vaccination 02/01/2020, 02/08/2020, 09/08/2020, 06/01/2021   PPD Test 03/24/2017, 03/25/2020   Pneumococcal Conjugate-13 11/07/2013   Pneumococcal Polysaccharide-23 12/05/2005, 09/08/2011   Td 12/05/2001   Tdap 10/29/2012   Zoster, Live 08/21/2008    TDAP status: Due, Education has been provided regarding the importance of this vaccine. Advised may receive this vaccine at local pharmacy or Health Dept. Aware to provide a copy of the vaccination record if obtained from local pharmacy or Health Dept. Verbalized acceptance and understanding.  Flu Vaccine status: Due, Education has been provided regarding the importance of this vaccine. Advised may receive this vaccine at local pharmacy or Health Dept. Aware to provide a copy of the vaccination record if obtained from local pharmacy or Health Dept. Verbalized acceptance and understanding.  Pneumococcal vaccine status: Up to date  Covid-19 vaccine status: Information provided on how to obtain vaccines.   Qualifies for Shingles Vaccine? Yes   Zostavax completed No   Shingrix Completed?: No.    Education has been provided regarding the importance of this vaccine. Patient has been advised to call insurance company to determine out of pocket expense if they have  not yet received this vaccine. Advised may also receive vaccine at local pharmacy or Health Dept. Verbalized acceptance and understanding.  Screening Tests Health Maintenance  Topic Date Due   COVID-19 Vaccine (5 - 2023-24 season) 08/06/2023   Zoster Vaccines- Shingrix (1 of 2) 09/20/2023 (Originally 02/05/1960)   INFLUENZA VACCINE  03/04/2024 (Originally 07/06/2023)   MAMMOGRAM  11/17/2023   Diabetic kidney evaluation - Urine ACR  12/20/2023   HEMOGLOBIN A1C  12/21/2023   Diabetic kidney evaluation - eGFR measurement  06/19/2024   FOOT EXAM  06/19/2024   OPHTHALMOLOGY EXAM  08/30/2024   Medicare Annual Wellness (AWV)  09/11/2024   Pneumonia Vaccine 34+ Years old  Completed   DEXA SCAN  Completed   HPV VACCINES  Aged Out   DTaP/Tdap/Td  Discontinued    Health Maintenance  Health Maintenance Due  Topic Date Due   COVID-19 Vaccine (5 - 2023-24 season) 08/06/2023    Colorectal cancer screening: No longer required.   Mammogram status: Completed 11/16/22, scheduled 11/22/23. Repeat every year  Bone Density status: Completed 02/09/22. Results reflect: Bone density results: OSTEOPENIA. Repeat every 2 years.  Additional Screening:  Vision Screening: Recommended annual ophthalmology exams for early detection of glaucoma and other disorders of the eye. Is the patient up to date with their annual eye exam?  No  Who is the provider or what is the name of the office in which the patient attends annual eye exams? Encouraged to follow up with new provider  If pt is not established with a provider, would they like to be referred to a provider to establish care? Yes .   Dental Screening: Recommended annual dental exams for proper oral hygiene  Diabetic Foot Exam: Diabetic Foot Exam: Completed 06/20/23  Community Resource Referral / Chronic Care Management: CRR required this visit?  No   CCM required this visit?  No     Plan:     I have personally reviewed and noted the following in  the patient's chart:   Medical and social history Use of alcohol, tobacco or illicit drugs  Current medications and supplements including opioid prescriptions. Patient is not currently taking opioid prescriptions. Functional ability and status Nutritional status Physical activity Advanced directives List of other physicians Hospitalizations, surgeries, and ER visits in previous 12 months Vitals Screenings to include cognitive, depression, and falls Referrals and appointments  In addition, I have reviewed and discussed with patient certain preventive protocols, quality metrics, and best practice recommendations. A written personalized care plan for preventive services as well as general preventive health recommendations were provided to patient.     Marzella Schlein, LPN   07/11/5783   After Visit Summary: (MyChart) Due to this being a telephonic visit, the after visit summary with patients personalized plan was offered to patient via MyChart   Nurse Notes: none

## 2023-09-12 NOTE — Patient Instructions (Signed)
Maria [name] , Thank you for taking time to come for your Medicare Wellness Visit. I appreciate your ongoing commitment to your health goals. Please review the following plan we discussed and let me know if I can assist you in the future.   Referrals/Orders/Follow-Ups/Clinician Recommendations: maintain health and activity   This is a list of the screening recommended for you and due dates:  Health Maintenance  Topic Date Due   Flu Shot  07/06/2023   COVID-19 Vaccine (5 - 2023-24 season) 08/06/2023   Zoster (Shingles) Vaccine (1 of 2) 09/20/2023*   Mammogram  11/17/2023   Yearly kidney health urinalysis for diabetes  12/20/2023   Hemoglobin A1C  12/21/2023   Yearly kidney function blood test for diabetes  06/19/2024   Complete foot exam   06/19/2024   Eye exam for diabetics  08/30/2024   Medicare Annual Wellness Visit  09/11/2024   Pneumonia Vaccine  Completed   DEXA scan (bone density measurement)  Completed   HPV Vaccine  Aged Out   DTaP/Tdap/Td vaccine  Discontinued  *Topic was postponed. The date shown is not the original due date.    Advanced directives: (Declined) Advance directive discussed with you today. Even though you declined this today, please call our office should you change your mind, and we can give you the proper paperwork for you to fill out.  Next Medicare Annual Wellness Visit scheduled for next year: Yes

## 2023-10-05 ENCOUNTER — Telehealth: Payer: Self-pay | Admitting: Family Medicine

## 2023-10-05 NOTE — Telephone Encounter (Signed)
Pt would like a call back with eye results from eye dr to see if she has diabetes. Please advise.

## 2023-10-05 NOTE — Telephone Encounter (Signed)
Called and spoke with pt and made aware no retinopathy was found on her diabetic eye exam.

## 2023-10-23 DIAGNOSIS — I509 Heart failure, unspecified: Secondary | ICD-10-CM | POA: Diagnosis not present

## 2023-10-23 DIAGNOSIS — E1122 Type 2 diabetes mellitus with diabetic chronic kidney disease: Secondary | ICD-10-CM | POA: Diagnosis not present

## 2023-10-23 DIAGNOSIS — E785 Hyperlipidemia, unspecified: Secondary | ICD-10-CM | POA: Diagnosis not present

## 2023-10-23 DIAGNOSIS — N184 Chronic kidney disease, stage 4 (severe): Secondary | ICD-10-CM | POA: Diagnosis not present

## 2023-10-23 DIAGNOSIS — N1832 Chronic kidney disease, stage 3b: Secondary | ICD-10-CM | POA: Diagnosis not present

## 2023-10-23 DIAGNOSIS — I129 Hypertensive chronic kidney disease with stage 1 through stage 4 chronic kidney disease, or unspecified chronic kidney disease: Secondary | ICD-10-CM | POA: Diagnosis not present

## 2023-10-25 ENCOUNTER — Other Ambulatory Visit: Payer: Self-pay | Admitting: Internal Medicine

## 2023-10-25 DIAGNOSIS — N1832 Chronic kidney disease, stage 3b: Secondary | ICD-10-CM

## 2023-10-25 DIAGNOSIS — I509 Heart failure, unspecified: Secondary | ICD-10-CM

## 2023-10-25 DIAGNOSIS — E785 Hyperlipidemia, unspecified: Secondary | ICD-10-CM

## 2023-10-25 DIAGNOSIS — E1122 Type 2 diabetes mellitus with diabetic chronic kidney disease: Secondary | ICD-10-CM

## 2023-10-27 ENCOUNTER — Ambulatory Visit
Admission: RE | Admit: 2023-10-27 | Discharge: 2023-10-27 | Disposition: A | Discharge status: Home / Self Care | Payer: No Typology Code available for payment source | Source: Ambulatory Visit | Attending: Internal Medicine | Admitting: Internal Medicine

## 2023-10-27 DIAGNOSIS — E1122 Type 2 diabetes mellitus with diabetic chronic kidney disease: Secondary | ICD-10-CM

## 2023-10-27 DIAGNOSIS — N1832 Chronic kidney disease, stage 3b: Secondary | ICD-10-CM

## 2023-10-27 DIAGNOSIS — E785 Hyperlipidemia, unspecified: Secondary | ICD-10-CM

## 2023-10-27 DIAGNOSIS — N189 Chronic kidney disease, unspecified: Secondary | ICD-10-CM | POA: Diagnosis not present

## 2023-10-27 DIAGNOSIS — I509 Heart failure, unspecified: Secondary | ICD-10-CM

## 2023-10-30 ENCOUNTER — Encounter: Payer: Self-pay | Admitting: Family Medicine

## 2023-10-30 ENCOUNTER — Ambulatory Visit: Payer: No Typology Code available for payment source | Admitting: Family Medicine

## 2023-10-30 VITALS — BP 111/69 | HR 99 | Temp 98.4°F | Resp 18 | Ht 61.2 in | Wt 134.0 lb

## 2023-10-30 DIAGNOSIS — L720 Epidermal cyst: Secondary | ICD-10-CM | POA: Diagnosis not present

## 2023-10-30 NOTE — Patient Instructions (Signed)
Clean daily and apply ointment and bandaid.  Worse, more drainage, let me know  Happy Holidays!

## 2023-10-30 NOTE — Progress Notes (Signed)
Subjective:    Patient ID: Maria [name], female    DOB: Apr 11, 1941, 82 y.o.   MRN: [id number]  Chief Complaint  Patient presents with   Nevus    Mole/Boil on right side of face above temple, no pain, feels like it wants to itch, sensitive to touch    HPI Facial lesion - Complains of a mole/boil on the right side of the face above the temple for a few weeks. Since then, it has grown in size. Denies pain all the time, is sensitive to the touch. Some itching. No drainage.  Was told "may need to be lanced"  There are no preventive care reminders to display for this patient.   Past Medical History:  Diagnosis Date   Arthritis    Bronchitis    hx of   Chronic kidney disease    Diabetes mellitus without complication (HCC)    GERD (gastroesophageal reflux disease)    History of bleeding ulcers    History of gastrointestinal hemorrhage    now off aspirin   Hyperlipidemia    Hypertension    Osteoarthritis of right knee 06/03/2012    Past Surgical History:  Procedure Laterality Date   ABDOMINAL HYSTERECTOMY     nonmalignant reasons   APPENDECTOMY     RIGHT/LEFT HEART CATH AND CORONARY ANGIOGRAPHY N/A 02/25/2022   Procedure: RIGHT/LEFT HEART CATH AND CORONARY ANGIOGRAPHY;  Surgeon: Kathleene Hazel, MD;  Location: [facility];  Service: Cardiovascular;  Laterality: N/A;   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 May substitute to any manufacturer covered by patient's insurance., Disp: 1 each, Rfl: 0   Calcium Carbonate-Vitamin D (CALCIUM PLUS VITAMIN D PO), Take 2 tablets by mouth daily., Disp: , Rfl:    cyanocobalamin 100 MCG tablet, Take 100 mcg by mouth daily., Disp: , Rfl:    dapagliflozin propanediol (FARXIGA) 10 MG TABS tablet, Take 10 mg by mouth daily., Disp: , Rfl:    furosemide (LASIX) 20 MG tablet, Take 1 tablet (20 mg total) by mouth as needed., Disp: 30 tablet, Rfl: 3   metoprolol (TOPROL XL) 200 MG 24 hr tablet, Take 1 tablet (200 mg total) by mouth daily., Disp: 90 tablet, Rfl: 3   Multiple Vitamin (MULTI-DAY PO), Take 2 tablets by mouth daily. womens multivitiamin gummies 2 a day, Disp: , Rfl:    Multiple Vitamins-Minerals (MEGAVITE FRUITS & VEGGIES PO), Take by mouth. Balance nature fruits and veggies, Disp: , Rfl:    omeprazole (PRILOSEC) 20 MG capsule, TAKE 1 CAPSULE(20 MG) BY MOUTH DAILY, Disp: 90 capsule, Rfl: 1   sacubitril-valsartan (ENTRESTO) 97-103 MG, Take 1 tablet by mouth 2 (two) times daily., Disp: 180 tablet, Rfl: 3   spironolactone (ALDACTONE) 25 MG tablet, TAKE 1 TABLET(25 MG) BY MOUTH DAILY, Disp: 90 tablet, Rfl: 1   triamcinolone cream (KENALOG) 0.1 %,  Apply 1 Application topically 2 (two) times daily. For 7-10 days maximum, Disp: 80 g, Rfl: 0  Allergies  Allergen Reactions   Coumadin [Warfarin Sodium]     Ulcers   Methocarbamol     Hallucinations   Norco [Hydrocodone-Acetaminophen]     Hallucinations   ROS neg/noncontributory except as noted HPI/below    Objective:     BP 111/69   Pulse 99   Temp 98.4 F (36.9 C) (Temporal)   Resp 18   Ht 5' 1.2" (1.554 m)   Wt 134 lb (60.8 kg)   SpO2 97%   BMI 25.15 kg/m  Wt Readings from Last 3 Encounters:  10/30/23 134 lb (60.8 kg)  09/12/23 127 lb 9.6 oz (57.9 kg)  09/06/23 127 lb 12.8 oz (58 kg)   Physical Exam   Gen: WDWN NAD HEENT: NCAT, conjunctiva not  injected, sclera nonicteric NECK:  supple, no thyromegaly, no nodes MSK: no gross abnormalities.  NEURO: A&O x3.  CN II-XII intact.  PSYCH: normal mood. Good eye contact  Epidermal cyst R temple-approx 1-1.5cm.  2 dark spots and some pus lower part.   Verbal informed consent.  Was able to express some sebaceous material and opened up other areas w/needle and pull out cores.  Tolerated procedure well.  Dressed w/abx oint and bandaid    Assessment & Plan:  Epidermoid cyst of face   Epidermoid cyst.  Expressed material.  Not extensive I&D.  Keep abx ointment and bandaid daily for few days.   Return if symptoms worsen or fail to improve.  Germaine Pomfret Rice,acting as a scribe for Angelena Sole, MD.,have documented all relevant documentation on the behalf of Angelena Sole, MD,as directed by  Angelena Sole, MD while in the presence of Angelena Sole, MD.  I, Angelena Sole, MD, have reviewed all documentation for this visit. The documentation on 10/30/23 for the exam, diagnosis, procedures, and orders are all accurate and complete.   Angelena Sole, MD

## 2023-11-04 LAB — LAB REPORT - SCANNED
Albumin, Urine POC: 49.1
EGFR: 33
Microalb Creat Ratio: 98

## 2023-11-07 ENCOUNTER — Telehealth: Payer: Self-pay | Admitting: Internal Medicine

## 2023-11-07 NOTE — Telephone Encounter (Signed)
Called pt in regards to pt assistance.  Pt reports received a letter that it's time to renew pt assistance for [place].  Advised pt will send concern to our pt assistance coordinator to f/u.

## 2023-11-07 NOTE — Telephone Encounter (Signed)
Patient calling about patient assistant. Please advise

## 2023-11-08 ENCOUNTER — Other Ambulatory Visit (HOSPITAL_COMMUNITY): Payer: Self-pay

## 2023-11-09 NOTE — Progress Notes (Signed)
Received referral for possible [facility]. Will schedule appointment.

## 2023-11-10 ENCOUNTER — Other Ambulatory Visit: Payer: Self-pay | Admitting: Family Medicine

## 2023-11-10 ENCOUNTER — Other Ambulatory Visit (HOSPITAL_COMMUNITY): Payer: Self-pay

## 2023-11-10 ENCOUNTER — Telehealth: Payer: Self-pay

## 2023-11-10 NOTE — Telephone Encounter (Signed)
Thanks Shamea! Patient is now enrolled in a [employer] grant to cover drug cost.

## 2023-11-10 NOTE — Telephone Encounter (Signed)
Please send in prescription for Entresto and Farxiga to [facility] [address] [place],  [place]  [address]. Pharmacy provided with grant processing information and delivery preference set to mail order.

## 2023-11-10 NOTE — Telephone Encounter (Signed)
Patient Advocate Encounter   The patient was approved for a [employer] grant that will help cover the cost of ENTRESTO AND FARXIGA Total amount awarded, $10,000.  Effective: 10/11/23 - 10/09/24   [id number]  Haze Rushing, CPhT  Pharmacy Patient Advocate Specialist  Direct Number: [phone] Fax: [phone]

## 2023-11-13 ENCOUNTER — Other Ambulatory Visit: Payer: Self-pay

## 2023-11-13 ENCOUNTER — Other Ambulatory Visit (HOSPITAL_COMMUNITY): Payer: Self-pay

## 2023-11-13 MED ORDER — SACUBITRIL-VALSARTAN 97-103 MG PO TABS
1.0000 | ORAL_TABLET | Freq: Two times a day (BID) | ORAL | 3 refills | Status: DC
  Filled 2023-11-13: qty 180, 90d supply, fill #0

## 2023-11-13 MED ORDER — DAPAGLIFLOZIN PROPANEDIOL 10 MG PO TABS
10.0000 mg | ORAL_TABLET | Freq: Every day | ORAL | 3 refills | Status: DC
  Filled 2023-11-13: qty 90, 90d supply, fill #0
  Filled 2024-04-05 (×2): qty 90, 90d supply, fill #1

## 2023-11-13 NOTE — Telephone Encounter (Signed)
Script for farxiga 10 mg PO every day and Entresto 97/103 mg PO BID sent to [facility] outpatient pharmacy.

## 2023-11-14 NOTE — Telephone Encounter (Signed)
Thanks so much Shamea! Left VM for patient explaining the grant approvals and how she can access those medications. Instructed patient to call me back on Jabber if she has additional questions [phone]).

## 2023-11-17 ENCOUNTER — Encounter: Payer: Self-pay | Admitting: Dietician

## 2023-11-17 ENCOUNTER — Encounter: Payer: No Typology Code available for payment source | Attending: Family Medicine | Admitting: Dietician

## 2023-11-17 VITALS — Wt 133.0 lb

## 2023-11-17 DIAGNOSIS — E119 Type 2 diabetes mellitus without complications: Secondary | ICD-10-CM | POA: Diagnosis not present

## 2023-11-17 NOTE — Progress Notes (Signed)
Diabetes Self-Management Education  Visit Type: Follow-up  Appt. Start Time: 0908 Appt. End Time: 4098  11/17/2023  [name], identified by name and date of birth, is a 82 y.o. female with a diagnosis of Diabetes:  .   ASSESSMENT Patient is here today alone.  She was last seen by this RD on 08/03/2023.  She states that she is feeling much better today compared to her last visit. She is checking her blood glucose periodically.  77-102 fasting, 136-160 after meal Glucose 95 in office post breakfast this am.  Referred diagnosis:  Type 2 diabetes and CKD   History includes:   type 2 diabetes, CKD, HTN, HLD. GERD. Medications include:  Metformin (d/c'd), Farxiga, Entresto, Lasix, spironolactone, MVI, fruit and veggie gummie, vitamin B-12 Labs noted:  A1C 6.3% 06/20/2023 decreased from 6.5 % 12/19/22, BUN 30 , Creatinine 1.67 Potasium 4.7, GFR 28 12/19/2022 BUN 42, Creatinine 1.8, Potassium 4.7, GFR 25, vitamin B12 1,295 06/20/2023 on 06/20/2023   Weight hx: 133 lbs 11/17/2023 129 lbs 06/20/2023 135 lbs 03/09/2023   Pt son lives with her.  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  Avoids added salt but diet is higher sodium due to foods eaten out and processed meat.  Weight 133 lb (60.3 kg). Body mass index is 24.97 kg/m.   Diabetes Self-Management Education - 11/17/23 1053       Visit Information   Visit Type Follow-up      Psychosocial Assessment   Patient Belief/Attitude about Diabetes Motivated to manage diabetes    What is the hardest part about your diabetes right now, causing you the most concern, or is the most worrisome to you about your diabetes?   Making healty food and beverage choices    Self-care barriers None    Self-management support Doctor's office;CDE visits    Other persons present Patient    Patient Concerns Nutrition/Meal planning;Healthy Lifestyle;Glycemic Control    Special Needs None     Preferred Learning Style No preference indicated    Learning Readiness Ready    How often do you need to have someone help you when you read instructions, pamphlets, or other written materials from your doctor or pharmacy? 1 - Never      Pre-Education Assessment   Patient understands the diabetes disease and treatment process. Demonstrates understanding / competency    Patient understands incorporating nutritional management into lifestyle. Needs Review    Patient undertands incorporating physical activity into lifestyle. Demonstrates understanding / competency    Patient understands using medications safely. Demonstrates understanding / competency    Patient understands monitoring blood glucose, interpreting and using results Demonstrates understanding / competency    Patient understands prevention, detection, and treatment of acute complications. Demonstrates understanding / competency    Patient understands prevention, detection, and treatment of chronic complications. Demonstrates understanding / competency    Patient understands how to develop strategies to address psychosocial issues. Demonstrates understanding / competency    Patient understands how to develop strategies to promote health/change behavior. Comprehends key points      Complications   How often do you check your blood sugar? 3-4 times / week    Fasting Blood glucose range (mg/dL) 11-914    Postprandial Blood glucose range (mg/dL) 782-956    Number of hypoglycemic episodes per month 0    Number of hyperglycemic episodes ( >200mg /dL): Never      Dietary Intake  Breakfast (frozen sandwich) croissant, egg, sausage, cheese, cantaloupe    Lunch [place], collard greens, candied yams    Snack (afternoon) grapes, graham cracker, occasional [place] sandwich    Dinner smothered pork chop cabbage, candied yam    Beverage(s) water, 2% milk, herbal tea with splenda, OJ "love it"      Activity / Exercise   Activity / Exercise Type  Light (walking / raking leaves)   work related   How many days per week do you exercise? 5    How many minutes per day do you exercise? 30    Total minutes per week of exercise 150      Patient Education   Previous Diabetes Education Yes (please comment)   07/2023   Healthy Eating Meal options for control of blood glucose level and chronic complications.    Being Active Role of exercise on diabetes management, blood pressure control and cardiac health.    Medications Reviewed patients medication for diabetes, action, purpose, timing of dose and side effects.    Monitoring Taught/evaluated SMBG meter.    Diabetes Stress and Support Identified and addressed patients feelings and concerns about diabetes;Worked with patient to identify barriers to care and solutions      Individualized Goals (developed by patient)   Nutrition General guidelines for healthy choices and portions discussed    Physical Activity Exercise 5-7 days per week;30 minutes per day    Medications take my medication as prescribed    Monitoring  Test my blood glucose as discussed    Problem Solving Eating Pattern    Reducing Risk examine blood glucose patterns;do foot checks daily;treat hypoglycemia with 15 grams of carbs if blood glucose less than 70mg /dL      Patient Self-Evaluation of Goals - Patient rates self as meeting previously set goals (% of time)   Nutrition >75% (most of the time)    Physical Activity >75% (most of the time)    Medications >75% (most of the time)    Monitoring >75% (most of the time)    Problem Solving and behavior change strategies  >75% (most of the time)    Reducing Risk (treating acute and chronic complications) >75% (most of the time)    Health Coping >75% (most of the time)      Post-Education Assessment   Patient understands the diabetes disease and treatment process. Demonstrates understanding / competency    Patient understands incorporating nutritional management into lifestyle.  Comprehends key points    Patient undertands incorporating physical activity into lifestyle. Demonstrates understanding / competency    Patient understands using medications safely. Demonstrates understanding / competency    Patient understands monitoring blood glucose, interpreting and using results Demonstrates understanding / competency    Patient understands prevention, detection, and treatment of acute complications. Demonstrates understanding / competency    Patient understands prevention, detection, and treatment of chronic complications. Demonstrates understanding / competency    Patient understands how to develop strategies to address psychosocial issues. Demonstrates understanding / competency    Patient understands how to develop strategies to promote health/change behavior. Comprehends key points      Outcomes   Expected Outcomes Demonstrated interest in learning. Expect positive outcomes    Future DMSE 3-4 months    Program Status Completed      Subsequent Visit   Since your last visit have you continued or begun to take your medications as prescribed? Yes    Since your last visit have you experienced any weight changes?  Gain    Weight Gain (lbs) 4    Since your last visit, are you checking your blood glucose at least once a day? No             Individualized Plan for Diabetes Self-Management Training:   Learning Objective:  Patient will have a greater understanding of diabetes self-management. Patient education plan is to attend individual and/or group sessions per assessed needs and concerns.   Plan:   Patient Instructions  Avoid/limit processed meat such as sausage, bacon, hot dogs, ham. Continue to avoid added salt Avoid processed foods. It is best to cook your own. Portion size of condiments   Continue to stay active. Continue to eat a diet rich in non-starchy vegetables, fresh fruit, lean protein, whole grains.  Expected Outcomes:  Demonstrated interest in  learning. Expect positive outcomes  Education material provided:   If problems or questions, patient to contact team via:  Phone  Future DSME appointment: 3-4 months

## 2023-11-17 NOTE — Patient Instructions (Addendum)
Avoid/limit processed meat such as sausage, bacon, hot dogs, ham. Continue to avoid added salt Avoid processed foods. It is best to cook your own. Portion size of condiments   Continue to stay active. Continue to eat a diet rich in non-starchy vegetables, fresh fruit, lean protein, whole grains.

## 2023-11-22 DIAGNOSIS — Z1231 Encounter for screening mammogram for malignant neoplasm of breast: Secondary | ICD-10-CM | POA: Diagnosis not present

## 2023-11-22 LAB — HM MAMMOGRAPHY

## 2023-11-23 ENCOUNTER — Encounter: Payer: Self-pay | Admitting: Family Medicine

## 2023-11-30 ENCOUNTER — Other Ambulatory Visit: Payer: Self-pay

## 2023-12-01 ENCOUNTER — Other Ambulatory Visit: Payer: Self-pay

## 2023-12-01 ENCOUNTER — Other Ambulatory Visit (HOSPITAL_COMMUNITY): Payer: Self-pay

## 2023-12-02 IMAGING — US US ABDOMEN LIMITED
1 series · 14 of 25 positions shown · non-contrast
Comparison: None.

CLINICAL DATA: Elevated LFTs

EXAM:
ULTRASOUND ABDOMEN LIMITED RIGHT UPPER QUADRANT

[Series 1: us abdomen limited · 0.17mm/px · 14 of 45 slices shown]
[im 1/45]
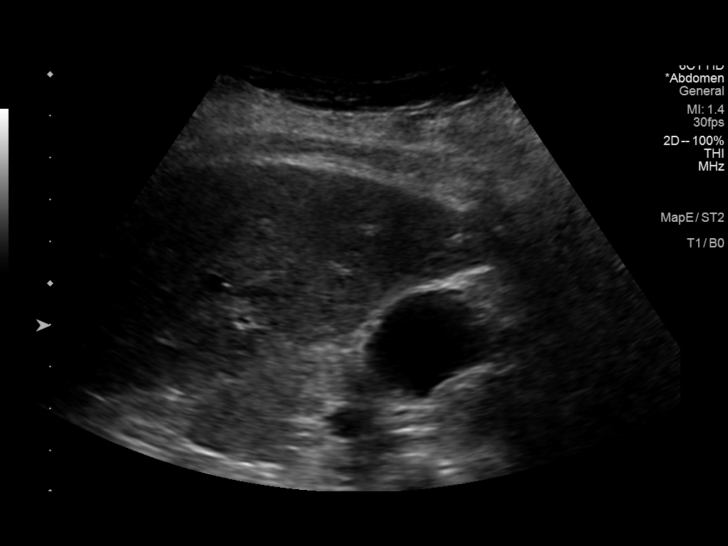
[im 4/45]
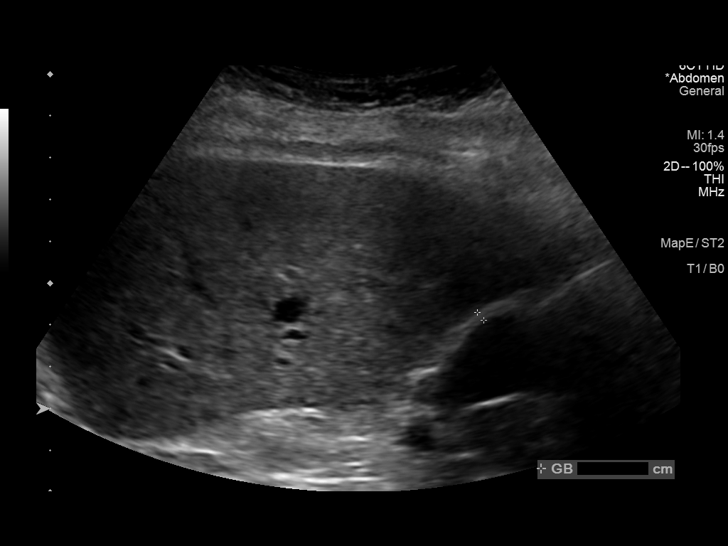
[im 8/45]
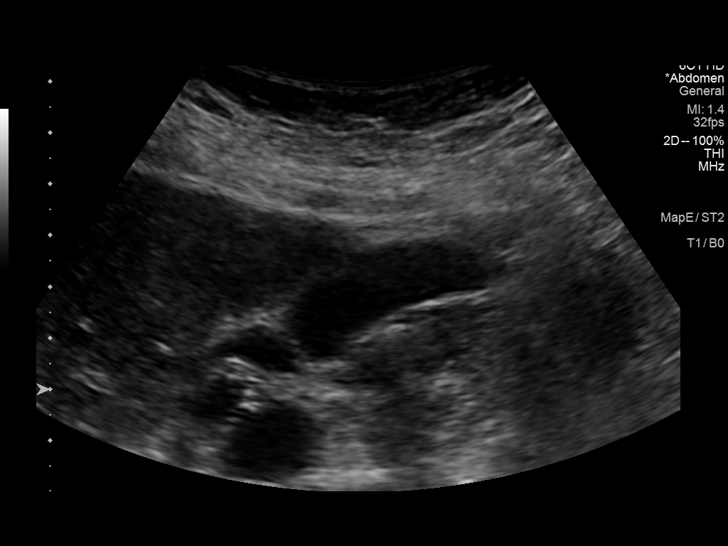
[im 12/45]
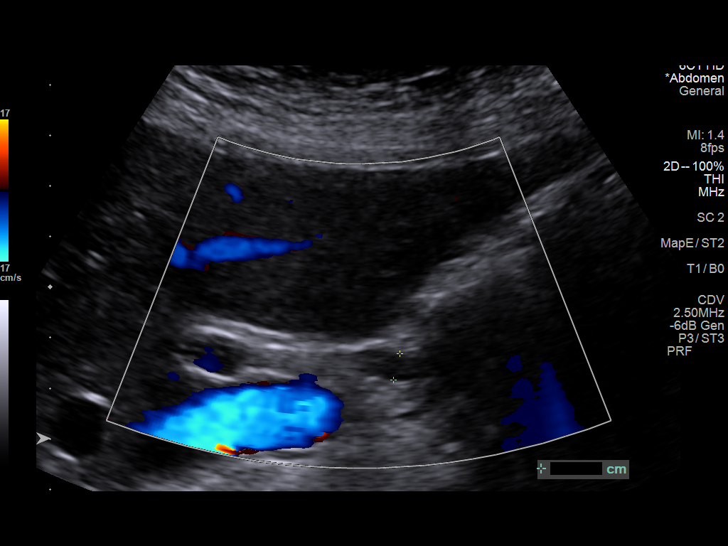
[im 15/45]
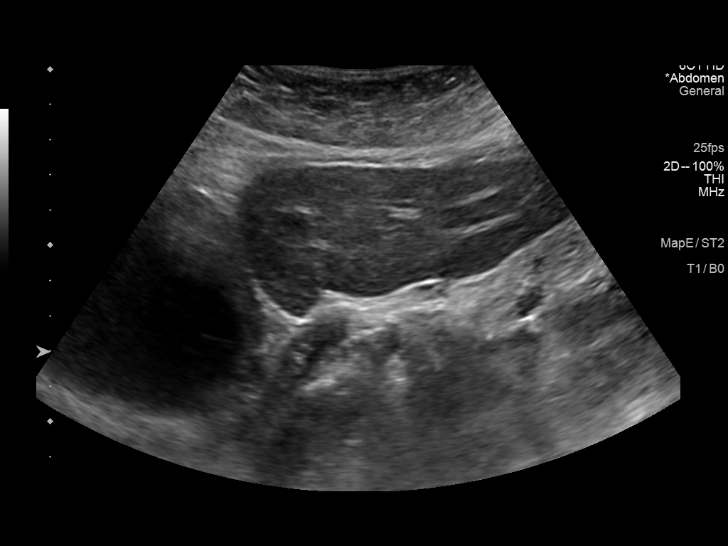
[im 17/45]
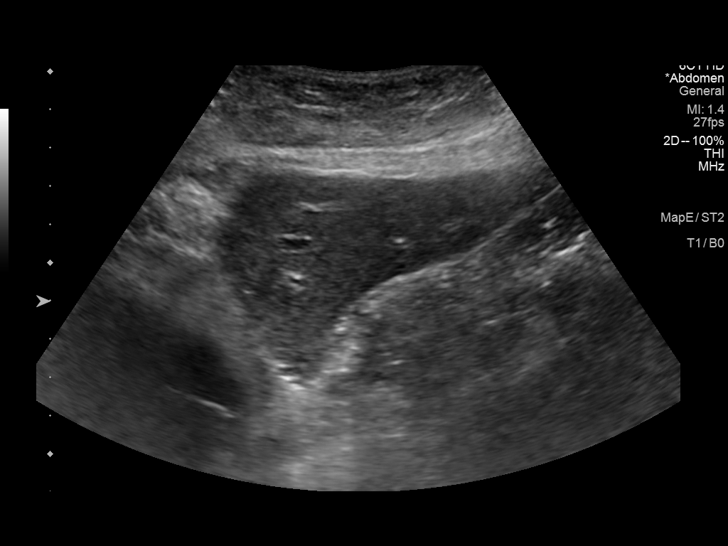
[im 21/45]
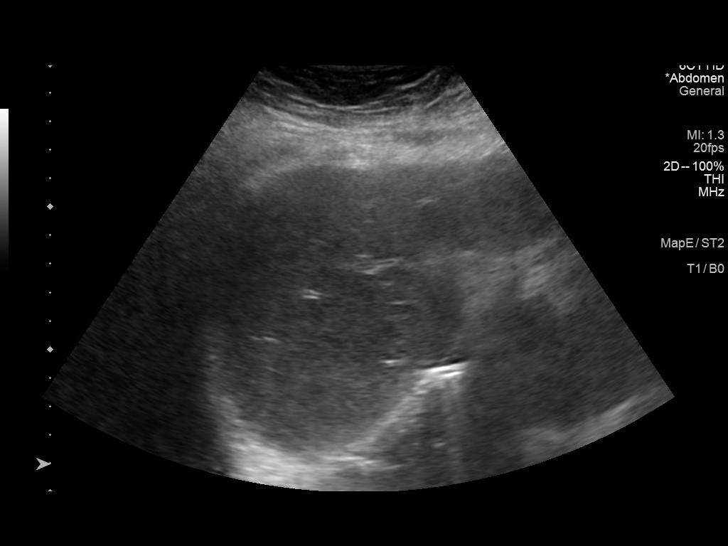
[im 24/45]
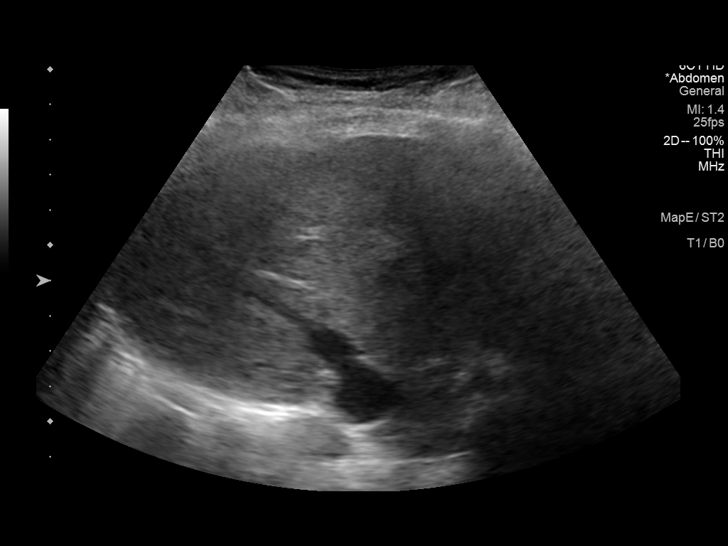
[im 28/45]
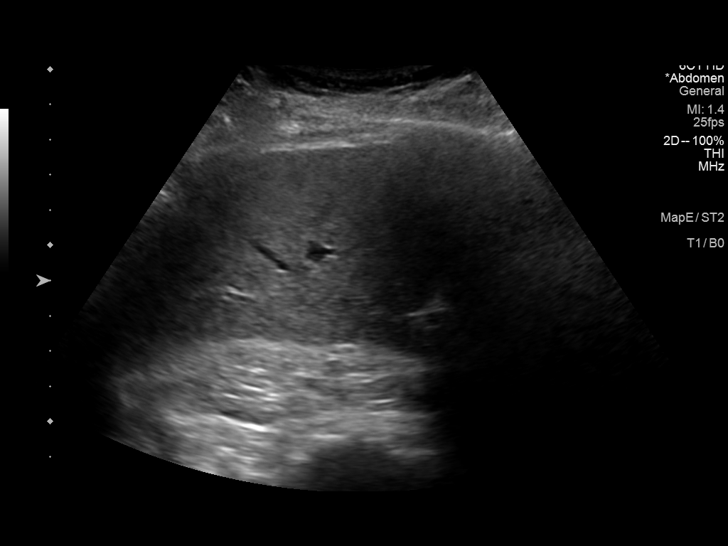
[im 30/45]
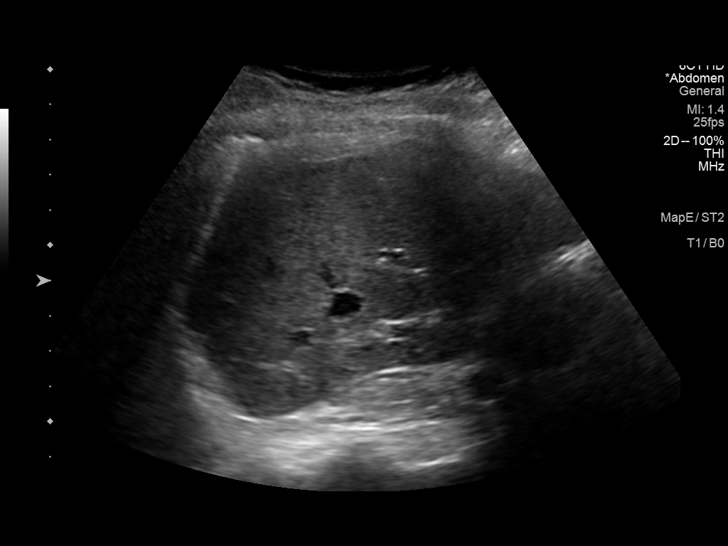
[im 34/45]
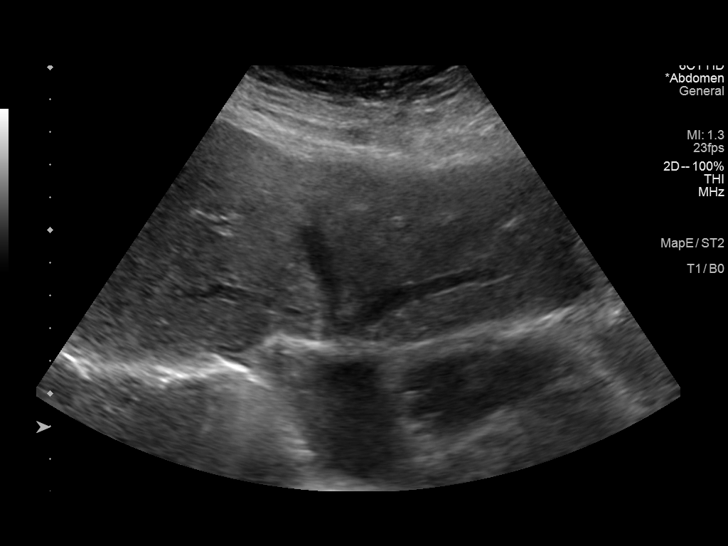
[im 37/45]
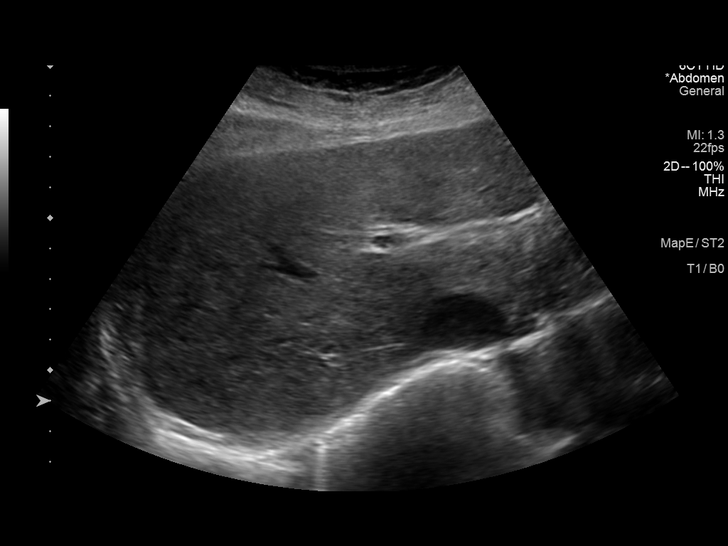
[im 41/45]
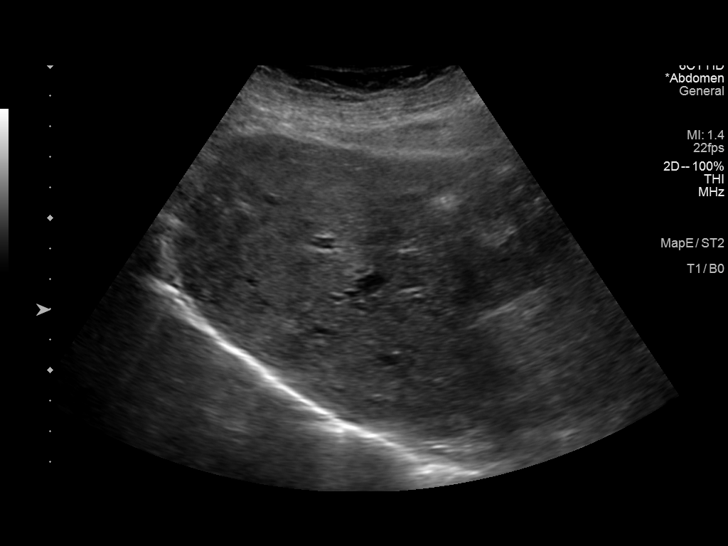
[im 45/45]
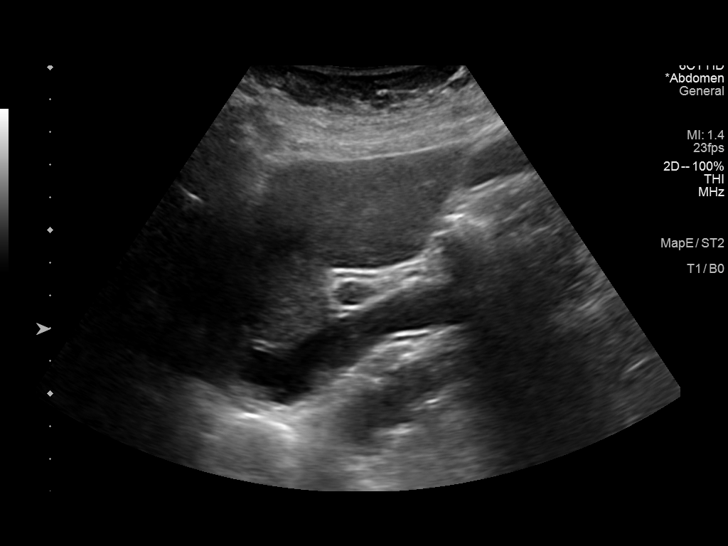

[14 of 25 positions shown; findings below may reference images not displayed]

FINDINGS: Gallbladder:

No gallstones or wall thickening visualized. No sonographic Murphy
sign noted by sonographer.

Common bile duct:

Diameter: 5.4 mm.

Liver:

Mild increased echogenicity is noted consistent with fatty
infiltration. No focal mass is noted. Portal vein is patent on color
Doppler imaging with normal direction of blood flow towards the
liver.

Other: None.
IMPRESSION: Fatty liver.

## 2023-12-04 ENCOUNTER — Inpatient Hospital Stay
Payer: No Typology Code available for payment source | Attending: Hematology and Oncology | Admitting: Hematology and Oncology

## 2023-12-04 ENCOUNTER — Inpatient Hospital Stay: Payer: No Typology Code available for payment source

## 2023-12-04 VITALS — BP 150/83 | HR 94 | Temp 97.4°F | Resp 17 | Wt 135.5 lb

## 2023-12-04 DIAGNOSIS — N189 Chronic kidney disease, unspecified: Secondary | ICD-10-CM | POA: Insufficient documentation

## 2023-12-04 DIAGNOSIS — E1122 Type 2 diabetes mellitus with diabetic chronic kidney disease: Secondary | ICD-10-CM | POA: Diagnosis not present

## 2023-12-04 DIAGNOSIS — M1990 Unspecified osteoarthritis, unspecified site: Secondary | ICD-10-CM | POA: Diagnosis not present

## 2023-12-04 DIAGNOSIS — D472 Monoclonal gammopathy: Secondary | ICD-10-CM

## 2023-12-04 DIAGNOSIS — I129 Hypertensive chronic kidney disease with stage 1 through stage 4 chronic kidney disease, or unspecified chronic kidney disease: Secondary | ICD-10-CM | POA: Insufficient documentation

## 2023-12-04 DIAGNOSIS — Z7984 Long term (current) use of oral hypoglycemic drugs: Secondary | ICD-10-CM | POA: Diagnosis not present

## 2023-12-04 DIAGNOSIS — Z79899 Other long term (current) drug therapy: Secondary | ICD-10-CM | POA: Insufficient documentation

## 2023-12-04 LAB — CMP (CANCER CENTER ONLY)
ALT: 13 U/L (ref 0–44)
AST: 22 U/L (ref 15–41)
Albumin: 4 g/dL (ref 3.5–5.0)
Alkaline Phosphatase: 123 U/L (ref 38–126)
Anion gap: 8 (ref 5–15)
BUN: 23 mg/dL (ref 8–23)
CO2: 27 mmol/L (ref 22–32)
Calcium: 9.7 mg/dL (ref 8.9–10.3)
Chloride: 103 mmol/L (ref 98–111)
Creatinine: 1.39 mg/dL — ABNORMAL HIGH (ref 0.44–1.00)
GFR, Estimated: 38 mL/min — ABNORMAL LOW (ref >60)
Glucose, Bld: 79 mg/dL (ref 70–99)
Potassium: 3.9 mmol/L (ref 3.5–5.1)
Sodium: 138 mmol/L (ref 135–145)
Total Bilirubin: 0.7 mg/dL (ref 0.0–1.2)
Total Protein: 7.1 g/dL (ref 6.5–8.1)

## 2023-12-04 LAB — CBC WITH DIFFERENTIAL (CANCER CENTER ONLY)
Abs Immature Granulocytes: 0.01 10*3/uL (ref 0.00–0.07)
Basophils Absolute: 0.1 10*3/uL (ref 0.0–0.1)
Basophils Relative: 1 %
Eosinophils Absolute: 0.1 10*3/uL (ref 0.0–0.5)
Eosinophils Relative: 1 %
HCT: 39.5 % (ref 36.0–46.0)
Hemoglobin: 13.2 g/dL (ref 12.0–15.0)
Immature Granulocytes: 0 %
Lymphocytes Relative: 22 %
Lymphs Abs: 1 10*3/uL (ref 0.7–4.0)
MCH: 27.8 pg (ref 26.0–34.0)
MCHC: 33.4 g/dL (ref 30.0–36.0)
MCV: 83.3 fL (ref 80.0–100.0)
Monocytes Absolute: 0.3 10*3/uL (ref 0.1–1.0)
Monocytes Relative: 7 %
Neutro Abs: 3.1 10*3/uL (ref 1.7–7.7)
Neutrophils Relative %: 69 %
Platelet Count: 215 10*3/uL (ref 150–400)
RBC: 4.74 MIL/uL (ref 3.87–5.11)
RDW: 13.9 % (ref 11.5–15.5)
WBC Count: 4.4 10*3/uL (ref 4.0–10.5)
nRBC: 0 % (ref 0.0–0.2)

## 2023-12-04 LAB — LACTATE DEHYDROGENASE: LDH: 194 U/L — ABNORMAL HIGH (ref 98–192)

## 2023-12-05 LAB — KAPPA/LAMBDA LIGHT CHAINS
Kappa free light chain: 53 mg/L — ABNORMAL HIGH (ref 3.3–19.4)
Kappa, lambda light chain ratio: 1.53 (ref 0.26–1.65)
Lambda free light chains: 34.7 mg/L — ABNORMAL HIGH (ref 5.7–26.3)

## 2023-12-05 LAB — BETA 2 MICROGLOBULIN, SERUM: Beta-2 Microglobulin: 3 mg/L — ABNORMAL HIGH (ref 0.6–2.4)

## 2023-12-12 LAB — MULTIPLE MYELOMA PANEL, SERUM
Albumin SerPl Elph-Mcnc: 3.7 g/dL (ref 2.9–4.4)
Albumin/Glob SerPl: 1.3 (ref 0.7–1.7)
Alpha 1: 0.2 g/dL (ref 0.0–0.4)
Alpha2 Glob SerPl Elph-Mcnc: 0.6 g/dL (ref 0.4–1.0)
B-Globulin SerPl Elph-Mcnc: 1 g/dL (ref 0.7–1.3)
Gamma Glob SerPl Elph-Mcnc: 1.2 g/dL (ref 0.4–1.8)
Globulin, Total: 3 g/dL (ref 2.2–3.9)
IgA: 249 mg/dL (ref 64–422)
IgG (Immunoglobin G), Serum: 1206 mg/dL (ref 586–1602)
IgM (Immunoglobulin M), Srm: 82 mg/dL (ref 26–217)
M Protein SerPl Elph-Mcnc: 0.4 g/dL — ABNORMAL HIGH
Total Protein ELP: 6.7 g/dL (ref 6.0–8.5)

## 2023-12-14 ENCOUNTER — Other Ambulatory Visit: Payer: Self-pay | Admitting: *Deleted

## 2023-12-14 ENCOUNTER — Ambulatory Visit (HOSPITAL_COMMUNITY)
Admission: RE | Admit: 2023-12-14 | Discharge: 2023-12-14 | Disposition: A | Discharge status: Home / Self Care | Payer: No Typology Code available for payment source | Source: Ambulatory Visit | Attending: Hematology and Oncology | Admitting: Hematology and Oncology

## 2023-12-14 DIAGNOSIS — D472 Monoclonal gammopathy: Secondary | ICD-10-CM

## 2023-12-14 DIAGNOSIS — M47816 Spondylosis without myelopathy or radiculopathy, lumbar region: Secondary | ICD-10-CM | POA: Diagnosis not present

## 2023-12-14 DIAGNOSIS — I700 Atherosclerosis of aorta: Secondary | ICD-10-CM | POA: Diagnosis not present

## 2023-12-20 LAB — UPEP/UIFE/LIGHT CHAINS/TP, 24-HR UR
% BETA, Urine: 30.3 %
ALPHA 1 URINE: 6.7 %
Albumin, U: 32.4 %
Alpha 2, Urine: 14.2 %
Free Kappa Lt Chains,Ur: 102.57 mg/L — ABNORMAL HIGH (ref 1.17–86.46)
Free Kappa/Lambda Ratio: 7.26 (ref 1.83–14.26)
Free Lambda Lt Chains,Ur: 14.13 mg/L (ref 0.27–15.21)
GAMMA GLOBULIN URINE: 16.4 %
Total Protein, Urine-Ur/day: 86 mg/(24.h) (ref 30–150)
Total Protein, Urine: 9.3 mg/dL
Total Volume: 925

## 2023-12-26 NOTE — Progress Notes (Signed)
Patients labs and bone scan were unremarkable, per Dr Leonides Schanz will follow up in 6 months. Will monitor for any further needs.

## 2024-01-05 ENCOUNTER — Ambulatory Visit (INDEPENDENT_AMBULATORY_CARE_PROVIDER_SITE_OTHER): Payer: No Typology Code available for payment source | Admitting: Family Medicine

## 2024-01-05 ENCOUNTER — Encounter: Payer: Self-pay | Admitting: Family Medicine

## 2024-01-05 VITALS — BP 118/66 | HR 91 | Temp 97.2°F | Ht 61.2 in | Wt 129.0 lb

## 2024-01-05 DIAGNOSIS — I10 Essential (primary) hypertension: Secondary | ICD-10-CM

## 2024-01-05 DIAGNOSIS — E785 Hyperlipidemia, unspecified: Secondary | ICD-10-CM

## 2024-01-05 DIAGNOSIS — E1122 Type 2 diabetes mellitus with diabetic chronic kidney disease: Secondary | ICD-10-CM

## 2024-01-05 DIAGNOSIS — I5022 Chronic systolic (congestive) heart failure: Secondary | ICD-10-CM | POA: Diagnosis not present

## 2024-01-05 DIAGNOSIS — I428 Other cardiomyopathies: Secondary | ICD-10-CM | POA: Diagnosis not present

## 2024-01-05 DIAGNOSIS — N183 Chronic kidney disease, stage 3 unspecified: Secondary | ICD-10-CM | POA: Diagnosis not present

## 2024-01-05 DIAGNOSIS — Z0000 Encounter for general adult medical examination without abnormal findings: Secondary | ICD-10-CM

## 2024-01-05 DIAGNOSIS — R809 Proteinuria, unspecified: Secondary | ICD-10-CM | POA: Diagnosis not present

## 2024-01-05 LAB — LIPID PANEL
Cholesterol: 143 mg/dL (ref 0–200)
HDL: 54.7 mg/dL (ref >39.00)
LDL Cholesterol: 80 mg/dL (ref 0–99)
NonHDL: 88.11
Total CHOL/HDL Ratio: 3
Triglycerides: 40 mg/dL (ref 0.0–149.0)
VLDL: 8 mg/dL (ref 0.0–40.0)

## 2024-01-05 LAB — COMPREHENSIVE METABOLIC PANEL
ALT: 11 U/L (ref 0–35)
AST: 20 U/L (ref 0–37)
Albumin: 4 g/dL (ref 3.5–5.2)
Alkaline Phosphatase: 103 U/L (ref 39–117)
BUN: 39 mg/dL — ABNORMAL HIGH (ref 6–23)
CO2: 29 meq/L (ref 19–32)
Calcium: 9.7 mg/dL (ref 8.4–10.5)
Chloride: 104 meq/L (ref 96–112)
Creatinine, Ser: 1.91 mg/dL — ABNORMAL HIGH (ref 0.40–1.20)
GFR: 23.9 mL/min — ABNORMAL LOW (ref >60.00)
Glucose, Bld: 90 mg/dL (ref 70–99)
Potassium: 5 meq/L (ref 3.5–5.1)
Sodium: 140 meq/L (ref 135–145)
Total Bilirubin: 0.7 mg/dL (ref 0.2–1.2)
Total Protein: 6.9 g/dL (ref 6.0–8.3)

## 2024-01-05 LAB — CBC WITH DIFFERENTIAL/PLATELET
Basophils Absolute: 0 10*3/uL (ref 0.0–0.1)
Basophils Relative: 0.9 % (ref 0.0–3.0)
Eosinophils Absolute: 0.1 10*3/uL (ref 0.0–0.7)
Eosinophils Relative: 2.4 % (ref 0.0–5.0)
HCT: 40 % (ref 36.0–46.0)
Hemoglobin: 12.9 g/dL (ref 12.0–15.0)
Lymphocytes Relative: 24.8 % (ref 12.0–46.0)
Lymphs Abs: 0.9 10*3/uL (ref 0.7–4.0)
MCHC: 32.3 g/dL (ref 30.0–36.0)
MCV: 85.5 fL (ref 78.0–100.0)
Monocytes Absolute: 0.3 10*3/uL (ref 0.1–1.0)
Monocytes Relative: 8.6 % (ref 3.0–12.0)
Neutro Abs: 2.4 10*3/uL (ref 1.4–7.7)
Neutrophils Relative %: 63.3 % (ref 43.0–77.0)
Platelets: 184 10*3/uL (ref 150.0–400.0)
RBC: 4.68 Mil/uL (ref 3.87–5.11)
RDW: 15.2 % (ref 11.5–15.5)
WBC: 3.7 10*3/uL — ABNORMAL LOW (ref 4.0–10.5)

## 2024-01-05 LAB — HEMOGLOBIN A1C: Hgb A1c MFr Bld: 6.4 % (ref 4.6–6.5)

## 2024-01-05 IMAGING — DX DG CHEST 2V
2 series · 2 of 2 positions shown · non-contrast
Comparison: December 22, 2021.

CLINICAL DATA: Cough, shortness of breath.

EXAM:
CHEST - 2 VIEW

[chest pa]
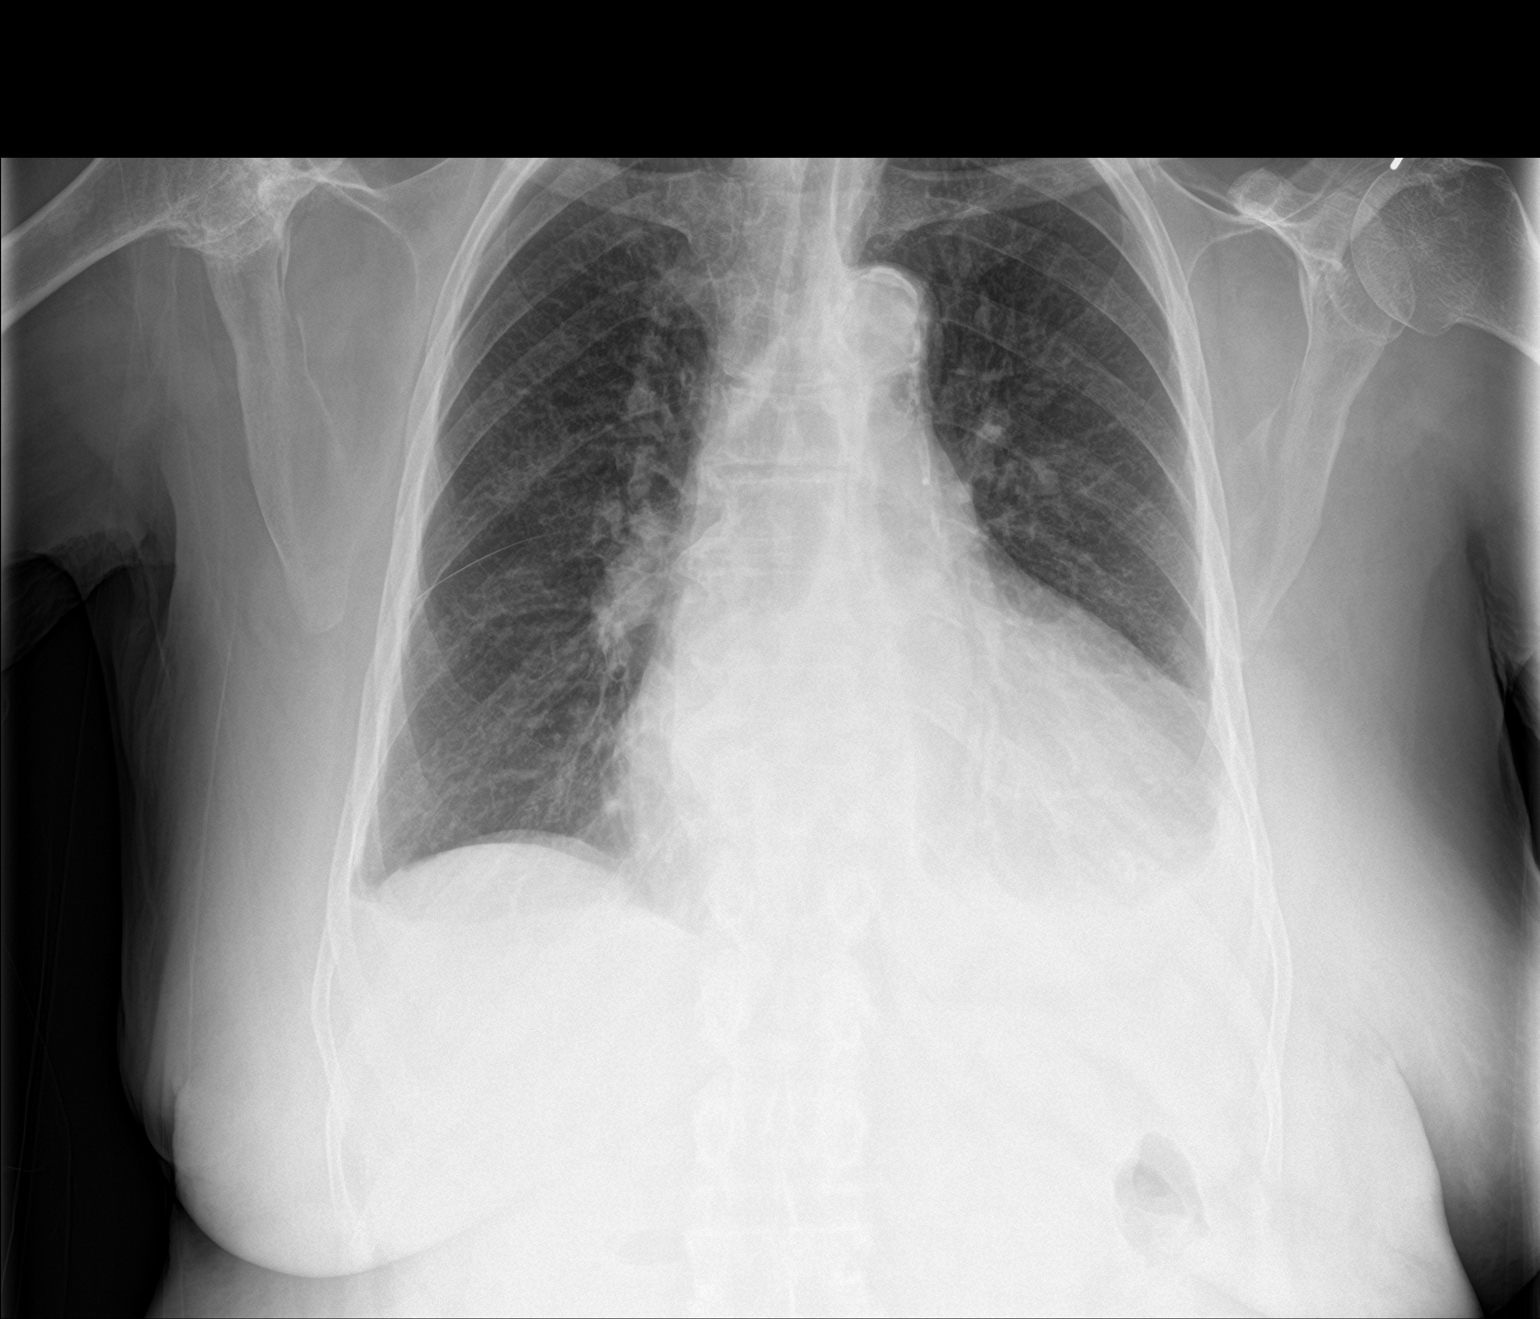

[chest lat]
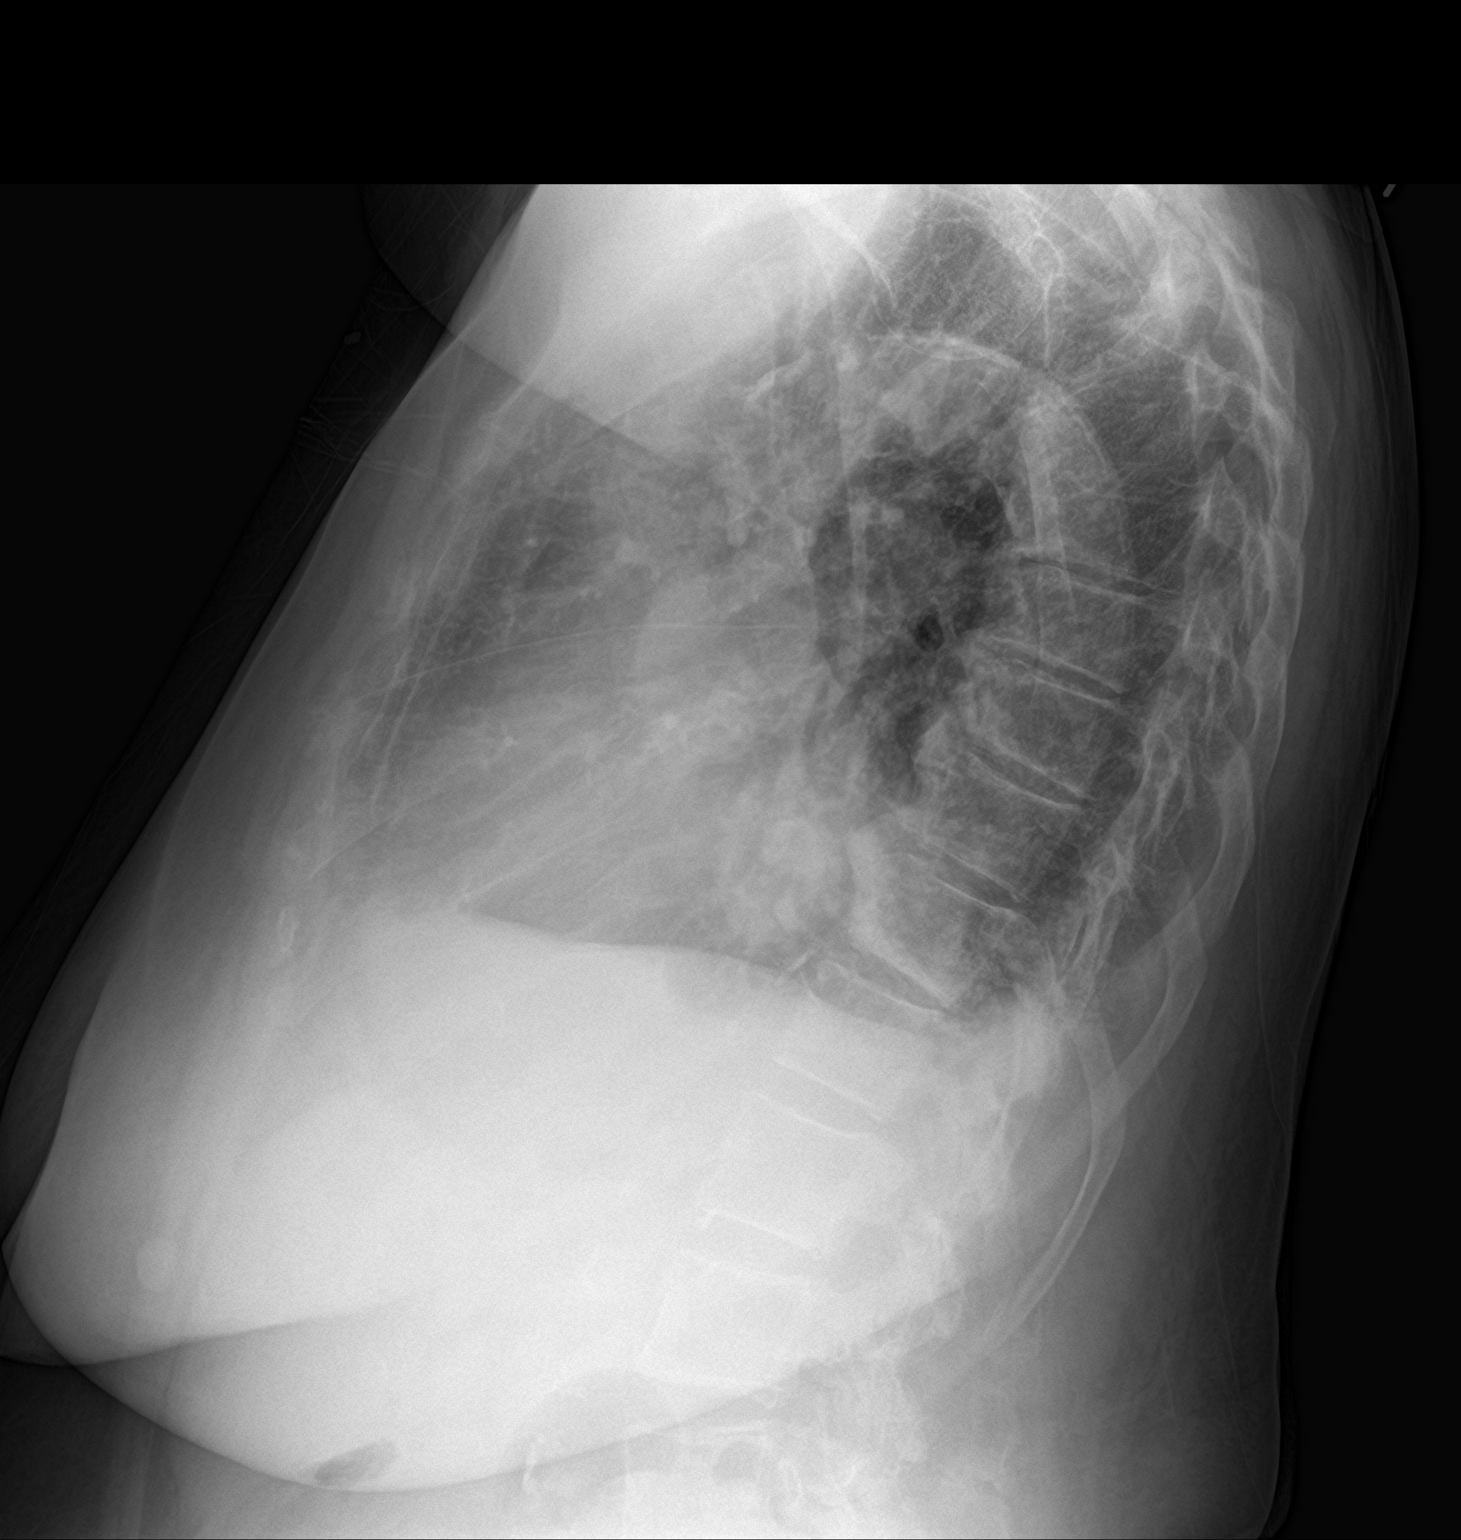

[2 of 2 positions shown; findings below may reference images not displayed]

FINDINGS: Stable cardiomegaly. Minimal right pleural effusion is noted. Mild
left basilar subsegmental atelectasis is noted with small left
pleural effusion. Bony thorax is unremarkable.
IMPRESSION: Mild left basilar subsegmental atelectasis is noted with small left
pleural effusion. Minimal right pleural effusion.

Aortic Atherosclerosis ([id number]).

## 2024-01-05 NOTE — Patient Instructions (Addendum)
discussed shingrix, Tetanus, Diphtheria, and Pertussis (Tdap), COVID options at pharmacy  Please stop by lab before you go If you have mychart- we will send your results within 3 business days of [place] receiving them.  If you do not have mychart- we will call you about results within 5 business days of [place] receiving them.  *please also note that you will see labs on mychart as soon as they post. I will later go in and write notes on them- will say "notes from Dr. Durene Cal"   Recommended follow up: Return in about 4 months (around 05/04/2024) for followup or sooner if needed.Schedule b4 you leave.

## 2024-01-05 NOTE — Progress Notes (Signed)
Phone [phone]   Subjective:  Patient presents today for their annual physical. Chief complaint-noted.   See problem oriented charting- ROS- full  review of systems was completed and negative except for: occasionally has to drink extra fluid with pills  The following were reviewed and entered/updated in epic: Past Medical History:  Diagnosis Date   Arthritis    Bronchitis    hx of   Chronic kidney disease    Diabetes mellitus without complication (HCC)    GERD (gastroesophageal reflux disease)    History of bleeding ulcers    History of gastrointestinal hemorrhage    now off aspirin   Hyperlipidemia    Hypertension    Osteoarthritis of right knee 06/03/2012   Patient Active Problem List   Diagnosis Date Noted   Chronic systolic CHF (congestive heart failure), NYHA class 3 (HCC)     Priority: High   NICM (nonischemic cardiomyopathy) (HCC)     Priority: High   Type 2 diabetes mellitus with chronic kidney disease (HCC) 04/16/2020    Priority: High   History of gastrointestinal hemorrhage     Priority: High   Fatty liver 01/07/2022    Priority: Medium    Microalbuminuria 12/17/2020    Priority: Medium    Aortic atherosclerosis (HCC) 06/18/2020    Priority: Medium    Chronic kidney disease (CKD), stage III (moderate) (HCC) 10/04/2019    Priority: Medium    GERD (gastroesophageal reflux disease) 05/27/2014    Priority: Medium    Hyperlipidemia LDL goal <100 08/21/2008    Priority: Medium    Essential hypertension 08/21/2008    Priority: Medium    PVC (premature ventricular contraction) 02/14/2022    Priority: Low   Arthritis of right hip 10/10/2019    Priority: Low   Bursitis of right hip 10/10/2019    Priority: Low   Allergic rhinitis 10/03/2019    Priority: Low   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/08/2016    Priority: Low   Osteoarthritis of right knee 06/03/2012    Priority: Low   Past Surgical History:  Procedure Laterality Date   ABDOMINAL HYSTERECTOMY      nonmalignant reasons   APPENDECTOMY     RIGHT/LEFT HEART CATH AND CORONARY ANGIOGRAPHY N/A 02/25/2022   Procedure: RIGHT/LEFT HEART CATH AND CORONARY ANGIOGRAPHY;  Surgeon: Kathleene Hazel, MD;  Location: [facility];  Service: Cardiovascular;  Laterality: N/A;   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 90 tablet 3   Blood Glucose Monitoring Suppl DEVI 1 each by Does not apply route in the morning, at noon, and at bedtime. May substitute to any manufacturer covered by patient's insurance. 1 each 0   Calcium Carbonate-Vitamin D (CALCIUM PLUS VITAMIN  D PO) Take 2 tablets by mouth daily.     cyanocobalamin 100 MCG tablet Take 100 mcg by mouth daily.     dapagliflozin propanediol (FARXIGA) 10 MG TABS tablet Take 1 tablet (10 mg total) by mouth daily before breakfast. 90 tablet 3   furosemide (LASIX) 20 MG tablet Take 1 tablet (20 mg total) by mouth as needed. 30 tablet 3   metoprolol (TOPROL XL) 200 MG 24 hr tablet Take 1 tablet (200 mg total) by mouth daily. 90 tablet 3   Multiple Vitamins-Minerals (MEGAVITE FRUITS & VEGGIES PO) Take by mouth. Balance nature fruits and veggies     omeprazole (PRILOSEC) 20 MG capsule TAKE 1 CAPSULE(20 MG) BY MOUTH DAILY 90 capsule 1   sacubitril-valsartan (ENTRESTO)  97-103 MG Take 1 tablet by mouth 2 (two) times daily. 180 tablet 3   spironolactone (ALDACTONE) 25 MG tablet TAKE 1 TABLET(25 MG) BY MOUTH DAILY 90 tablet 1   Accu-Chek Softclix Lancets lancets      No current facility-administered medications for this visit.    Allergies-reviewed and updated Allergies  Allergen Reactions   Coumadin [Warfarin Sodium]     Ulcers   Methocarbamol     Hallucinations   Norco [Hydrocodone-Acetaminophen]     Hallucinations    Social History   Social History Narrative   Lives alone. Great granddaughter stays with her on weekends.    Divorced 57.       Still doing childcare   Retired from [employer]   Objective  Objective:  BP 118/66   Pulse 91   Temp (!) 97.2 F (36.2 C)   Ht 5' 1.2" (1.554 m)   Wt 129 lb (58.5 kg)   SpO2 98%   BMI 24.22 kg/m  Gen: NAD, resting comfortably HEENT: Mucous membranes are moist. Oropharynx normal Neck: no thyromegaly CV: RRR stable murmur- known aortic sclerosis Lungs: CTAB no crackles, wheeze, rhonchi Abdomen: soft/nontender/nondistended/normal bowel sounds. No rebound or guarding.  Ext: no edema Skin: warm, dry Neuro: grossly normal, moves all extremities, PERRLA   Assessment and Plan   83 y.o. female presenting for annual physical.  Health Maintenance counseling: 1. Anticipatory guidance: Patient counseled regarding regular dental exams -q6 months, eye exams -yearly but needs new eye doctor,  avoiding smoking and second hand smoke , limiting alcohol to 1 beverage per day- none , no illicit drugs .   2. Risk factor reduction:  Advised patient of need for regular exercise and diet rich and fruits and vegetables to reduce risk of heart attack and stroke.  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  Diet/weight management-stable from last visit.  Wt Readings from Last 3 Encounters:  01/05/24 129 lb (58.5 kg)  12/04/23 135 lb 8 oz (61.5 kg)  11/17/23 133 lb (60.3 kg)  3.  Immunizations/screenings/ancillary studies- discussed Shingrix (did have zostavax), Tetanus, Diphtheria, and Pertussis (Tdap), COVID options at pharmacy  Immunization History  Administered Date(s) Administered   Fluad Quad(high Dose 65+) 10/03/2019, 09/23/2020, 09/08/2022   Fluad Trivalent(High Dose 65+) 09/12/2023   Influenza Split 09/08/2011, 10/29/2012   Influenza Whole 08/21/2008, 09/06/2010   Influenza, High Dose Seasonal PF 09/29/2016, 08/23/2018   Influenza,inj,Quad PF,6+ Mos 11/07/2013   PFIZER(Purple Top)SARS-COV-2 Vaccination 01/11/2020, 02/01/2020, 02/08/2020, 09/08/2020, 06/01/2021   PPD Test 03/24/2017, 03/25/2020   Pneumococcal Conjugate-13 11/07/2013   Pneumococcal Polysaccharide-23 12/05/2005, 09/08/2011   Td 12/05/2001   Tdap 10/29/2012   Zoster, Live 08/21/2008   4. Cervical cancer screening- past age based screening recs. No vaginal  bleeding   5. Breast cancer screening-   mammogram - just had December 2024  6. Colon cancer screening - negative stool cards 2019- otherwise past age based screening recs. No blood in stool or dark black stool  7. Skin cancer screening- lower risk due to melanin content.   Denies worrisome, changing, or new skin lesions.  8. Birth control/STD check- not sexually active and postmenopausal  9. 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 Asked about some non FDA approved treatment and we opted out -Metformin stopped due to GFR under 30 -patient also had elevated microalbumin to creatinine ratio Lab Results  Component Value Date   HGBA1C 6.3 06/20/2023   HGBA1C 6.5 12/19/2022   HGBA1C 6.2  06/29/2022  A/P: hopefully stable- update a1c  today. 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 Valentino Nose  With microalbuminuria- on entresto plus farxiga - was referred to hematology after mspike  # CHF- Dr. Izora Ribas cardiology- diagnosed 2023 S:Medication: farxiga 10 mg, lasix 20 mg daily as needed- most days, entresto 97-103 mg, spironolactone 25 mg, metoprolol 200 mg XR - has had  cath 02/25/22 with no evidence of CAD- remain off aspirin A/P:  euvolemic/controlled- continue current medications   #Essential hypertension S: Compliant withlasix 20 mg daily, metoprolol 200 mg XR, entresto 97-103 mg, spironolactone 25 mg BP Readings from Last 3 Encounters:  01/05/24 118/66  12/04/23 (!) 150/83  10/30/23 111/69  A/P: stable- continue current medicines    #Hyperlipidemia aortic atherosclerosis S: Compliant with atorvastatin 80 mg - increased from 40 mg with LDL over 100 Lab Results  Component Value Date   CHOL 165 12/19/2022   HDL 45.80 12/19/2022   LDLCALC 109 (H) 12/19/2022   LDLDIRECT 71.0 06/20/2023   TRIG 51.0 12/19/2022   CHOLHDL 4 12/19/2022  A/P:  aortic atherosclerosis (presumed stable)- LDL goal ideally <70 - hopefully improved- update LDL on increased dose  #GERD S: Compliant with omeprazole 20 mg.  B12 last checked June 2022 A/P: stable- continue current medicines     # in past alk phos slightly high (ok last visit)- vit D  and ggt okay.  Echo also okay.  Alk phos okay fasting  Recommended follow up: Return in about 4 months (around 05/04/2024) for followup or sooner if needed.Schedule b4 you leave. Future Appointments  Date Time Provider Department Center  02/22/2024  9:00 AM Bonnita Levan, RD [facility]  04/01/2024  8:40 AM Christell Constant, MD [facility]  05/20/2024  2:30 PM [facility] None  05/28/2024  1:20 PM Georga Kaufmann T, PA-C [facility] None  09/17/2024  8:40 AM  [facility] ANNUAL WELLNESS VISIT 1 [facility]   Lab/Order associations: fasting   ICD-10-CM   1. Preventative health care  Z00.00     2. Type 2 diabetes mellitus with stage 3 chronic kidney disease, without long-term current use of insulin, unspecified whether stage 3a or 3b CKD (HCC)  E11.22 Comprehensive metabolic panel   Z61.09 CBC with Differential/Platelet    Lipid panel    Hemoglobin A1c    3. Chronic  systolic CHF (congestive heart failure), NYHA class 3 (HCC)  I50.22     4. NICM (nonischemic cardiomyopathy) (HCC)  I42.8     5. Hyperlipidemia LDL goal <100  E78.5 Comprehensive metabolic panel    CBC with Differential/Platelet    Lipid panel    6. Essential hypertension  I10     7. Stage 3 chronic kidney disease, unspecified whether stage 3a or 3b CKD (HCC)  N18.30     8. Microalbuminuria  R80.9       No orders of the defined types were placed in this encounter.   Return precautions advised.  Tana Conch, MD

## 2024-01-23 ENCOUNTER — Ambulatory Visit: Payer: No Typology Code available for payment source | Admitting: Family Medicine

## 2024-01-23 ENCOUNTER — Ambulatory Visit: Payer: Self-pay | Admitting: Family Medicine

## 2024-01-23 ENCOUNTER — Encounter: Payer: Self-pay | Admitting: Family Medicine

## 2024-01-23 VITALS — BP 154/82 | HR 108 | Temp 98.1°F | Resp 16 | Ht 61.2 in | Wt 130.5 lb

## 2024-01-23 DIAGNOSIS — R059 Cough, unspecified: Secondary | ICD-10-CM | POA: Diagnosis not present

## 2024-01-23 DIAGNOSIS — R0989 Other specified symptoms and signs involving the circulatory and respiratory systems: Secondary | ICD-10-CM | POA: Diagnosis not present

## 2024-01-23 DIAGNOSIS — U071 COVID-19: Secondary | ICD-10-CM | POA: Diagnosis not present

## 2024-01-23 LAB — POCT INFLUENZA A/B
Influenza A, POC: NEGATIVE
Influenza B, POC: NEGATIVE

## 2024-01-23 LAB — POC COVID19 BINAXNOW: SARS Coronavirus 2 Ag: POSITIVE — AB

## 2024-01-23 MED ORDER — BENZONATATE 100 MG PO CAPS: 100.0000 mg | ORAL_CAPSULE | Freq: Three times a day (TID) | ORAL | 0 refills | Status: DC | PRN

## 2024-01-23 NOTE — Patient Instructions (Addendum)
Covid  Cough drops.  Tylenol.  Sent cough perles.   Worse, ER

## 2024-01-23 NOTE — Telephone Encounter (Signed)
  Chief Complaint: cough Symptoms: hoarse, runny nose, diarrhea Frequency: comes and goes   Disposition: [] ED /[] Urgent Care (no appt availability in office) / [x] Appointment(In office/virtual)/ []  [facility]/ [] Home Care/ [] Refused Recommended Disposition /[] [facility]/ []  Follow-up with PCP Additional Notes: Pt complaining of cough, runny nose, and hoarseness since Sunday. Pt denies any SOB or fever. Pt is drinking plenty of fluids and eating chicken noodle soup. Pt states the diarrhea is better. Pt is taking OTC cough syrup and states it helps, but not enough. Pt states, "I just dont feel good." Per protocol, pt to be seen within 4 hours. Pt is seeing Kulik at  1400 today. RN gave care advice and pt verbalized understanding.           Copied from CRM [id number]. Topic: Appointments - Appointment Scheduling >> Jan 23, 2024 10:32 AM [place] A wrote: Patient has sever coughing, diarrhea and congestion Reason for Disposition  [1] MILD difficulty breathing (e.g., minimal/no SOB at rest, SOB with walking, pulse <100) AND [2] still present when not coughing  Answer Assessment - Initial Assessment Questions 1. ONSET: "When did the cough begin?"      Sunday  2. SEVERITY: "How bad is the cough today?"      Better with OTC medicine  3. SPUTUM: "Describe the color of your sputum" (none, dry cough; clear, white, yellow, green)     Yellow, white, brown 4. HEMOPTYSIS: "Are you coughing up any blood?" If so ask: "How much?" (flecks, streaks, tablespoons, etc.)     Denies  5. DIFFICULTY BREATHING: "Are you having difficulty breathing?" If Yes, ask: "How bad is it?" (e.g., mild, moderate, severe)    - MILD: No SOB at rest, mild SOB with walking, speaks normally in sentences, can lie down, no retractions, pulse < 100.    - MODERATE: SOB at rest, SOB with minimal exertion and prefers to sit, cannot lie down flat, speaks in phrases, mild retractions, audible wheezing, pulse  100-120.    - SEVERE: Very SOB at rest, speaks in single words, struggling to breathe, sitting hunched forward, retractions, pulse > 120      Denies  6. FEVER: "Do you have a fever?" If Yes, ask: "What is your temperature, how was it measured, and when did it start?"     Unsure  7. CARDIAC HISTORY: "Do you have any history of heart disease?" (e.g., heart attack, congestive heart failure)      CHF 8. LUNG HISTORY: "Do you have any history of lung disease?"  (e.g., pulmonary embolus, asthma, emphysema)     Denies  9. PE RISK FACTORS: "Do you have a history of blood clots?" (or: recent major surgery, recent prolonged travel, bedridden)     Denies  10. OTHER SYMPTOMS: "Do you have any other symptoms?" (e.g., runny nose, wheezing, chest pain)       Runny nose, diarrhea,  Protocols used: Cough - Acute Productive-A-AH

## 2024-01-23 NOTE — Telephone Encounter (Signed)
Pt is scheduled today for 2pm.

## 2024-01-23 NOTE — Progress Notes (Signed)
Subjective:     Patient ID: Maria [name], female    DOB: Nov 15, 1941, 84 y.o.   MRN: [id number]  Chief Complaint  Patient presents with   Cough    Productive cough with clear mucus that started Sunday, was yellowish/brown on Sunday Taking Delsym   Chest Congestion    HPI Discussed the use of AI scribe software for clinical note transcription with the patient, who gave verbal consent to proceed.  History of Present Illness   Maria [name] is an 83 year old female who presents with cough and runny nose.  Symptoms began on Sunday, January 21, 2024, with a runny nose and persistent cough that started while she was at church. The cough resonates in her throat and was initially productive of yellowish-brown sputum, which has since turned clear. She has tried home remedies such as chicken noodle soup, crushed ice, and hot tea, but symptoms persist. No pain, shortness of breath, fever, or chills are present.  On Monday morning, she experienced diarrhea, which has since resolved. She has maintained a normal appetite and continues to eat normally.  She has not received the RSV vaccine but has had the flu and pneumonia vaccines. She plans to get the shingles and new COVID vaccines but is waiting due to her current illness.  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       There are no preventive care reminders to display for this patient.  Past Medical History:  Diagnosis Date   Arthritis    Bronchitis    hx of   Chronic kidney disease    Diabetes mellitus without complication (HCC)    GERD (gastroesophageal reflux disease)    History of bleeding ulcers    History of gastrointestinal hemorrhage    now off aspirin   Hyperlipidemia    Hypertension    Osteoarthritis of right knee 06/03/2012    Past Surgical History:  Procedure Laterality Date   ABDOMINAL HYSTERECTOMY     nonmalignant reasons   APPENDECTOMY     RIGHT/LEFT HEART  CATH AND CORONARY ANGIOGRAPHY N/A 02/25/2022   Procedure: RIGHT/LEFT HEART CATH AND CORONARY ANGIOGRAPHY;  Surgeon: Kathleene Hazel, MD;  Location: [facility];  Service: Cardiovascular;  Laterality: N/A;   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   Procedure: LEFT TOTAL KNEE ARTHROPLASTY;  Surgeon: Loreta Ave, MD;  Location: [facility] OR;  Service: Orthopedics;  Laterality: Left;     Current Outpatient Medications:    Accu-Chek Softclix Lancets lancets, , Disp: , Rfl:    atorvastatin (LIPITOR) 80 MG tablet, Take 1 tablet (80 mg total) by mouth daily., Disp: 90 tablet, Rfl: 3   benzonatate (TESSALON PERLES) 100 MG capsule, Take 1 capsule (100 mg total) by mouth 3 (three) times daily as needed., Disp: 20 capsule, Rfl: 0   Blood Glucose Monitoring Suppl DEVI, 1 each by Does not apply route in the morning, at noon, and at bedtime. May substitute to any manufacturer covered by patient's insurance., Disp: 1 each, Rfl: 0   Calcium Carbonate-Vitamin D (CALCIUM PLUS VITAMIN D PO), Take 2 tablets by mouth daily., Disp: , Rfl:    cyanocobalamin 100 MCG tablet, Take 100 mcg by mouth daily., Disp: ,  Rfl:    dapagliflozin propanediol (FARXIGA) 10 MG TABS tablet, Take 1 tablet (10 mg total) by mouth daily before breakfast., Disp: 90 tablet, Rfl: 3   furosemide (LASIX) 20 MG tablet, Take 1 tablet (20 mg total) by mouth as needed., Disp: 30 tablet, Rfl: 3   metoprolol (TOPROL XL) 200 MG 24 hr tablet, Take 1 tablet (200 mg total) by mouth daily., Disp: 90 tablet, Rfl: 3   Multiple Vitamins-Minerals (MEGAVITE FRUITS & VEGGIES PO), Take by mouth. Balance nature fruits and veggies, Disp: , Rfl:    omeprazole (PRILOSEC) 20 MG capsule, TAKE 1 CAPSULE(20 MG) BY MOUTH DAILY, Disp: 90 capsule,  Rfl: 1   sacubitril-valsartan (ENTRESTO) 97-103 MG, Take 1 tablet by mouth 2 (two) times daily., Disp: 180 tablet, Rfl: 3   spironolactone (ALDACTONE) 25 MG tablet, TAKE 1 TABLET(25 MG) BY MOUTH DAILY, Disp: 90 tablet, Rfl: 1  Allergies  Allergen Reactions   Coumadin [Warfarin Sodium]     Ulcers   Methocarbamol     Hallucinations   Norco [Hydrocodone-Acetaminophen]     Hallucinations   ROS neg/noncontributory except as noted HPI/below      Objective:     BP (!) 154/82   Pulse (!) 108   Temp 98.1 F (36.7 C) (Temporal)   Resp 16   Ht 5' 1.2" (1.554 m)   Wt 130 lb 8 oz (59.2 kg)   SpO2 96%   BMI 24.50 kg/m  Wt Readings from Last 3 Encounters:  01/23/24 130 lb 8 oz (59.2 kg)  01/05/24 129 lb (58.5 kg)  12/04/23 135 lb 8 oz (61.5 kg)    Physical Exam   Gen: WDWN NAD. nontoxic HEENT: NCAT, conjunctiva not injected, sclera nonicteric TM WNL B, OP moist, no exudates   congested.   NECK:  supple, no thyromegaly, no nodes,  CARDIAC: RRR, S1S2+, no murmur. LUNGS: CTAB. No wheezes EXT:  no edema MSK: no gross abnormalities.  NEURO: A&O x3.  CN II-XII intact.  PSYCH: normal mood. Good eye contact  Results for orders placed or performed in visit on 01/23/24  POCT Influenza A/B   Collection Time: 01/23/24  2:32 PM  Result Value Ref Range   Influenza A, POC Negative Negative   Influenza B, POC Negative Negative  POC COVID-19   Collection Time: 01/23/24  2:32 PM  Result Value Ref Range   SARS Coronavirus 2 Ag Positive (A) Negative        Assessment & Plan:  COVID-19  Cough, unspecified type -     POCT Influenza A/B -     POC COVID-19 BinaxNow  Chest congestion -     POCT Influenza A/B -     POC COVID-19 BinaxNow  Other orders -     Benzonatate; Take 1 capsule (100 mg total) by mouth 3 (three) times daily as needed.  Dispense: 20 capsule; Refill: 0  Assessment and Plan    Upper Respiratory Infection (URI) Acute rhinorrhea, cough, and productive sputum  began on January 21, 2024, without fever, chills, or dyspnea. Differential diagnosis includes common cold, influenza, and COVID-19. Flu and COVID-19 tests are pending. Covid +.  discussed paxlovid-decided against.  tessalon perles and other otc symptomatic tx.   worse, ER  Diarrhea Diarrhea began on January 22, 2024, likely due to diet, but symptoms have resolved. No treatment is required.   General Health Maintenance Influenza and pneumococcal vaccines have been received. Administer shingles and updated COVID-19 vaccines.  Follow-up Notify for further management if flu  or COVID-19 test is positive. If tests are negative, no immediate follow-up is needed unless symptoms worsen.        Return if symptoms worsen or fail to improve.  Angelena Sole, MD

## 2024-02-09 DIAGNOSIS — Z008 Encounter for other general examination: Secondary | ICD-10-CM | POA: Diagnosis not present

## 2024-02-09 DIAGNOSIS — I5020 Unspecified systolic (congestive) heart failure: Secondary | ICD-10-CM | POA: Diagnosis not present

## 2024-02-13 ENCOUNTER — Telehealth: Payer: Self-pay | Admitting: Family Medicine

## 2024-02-13 NOTE — Telephone Encounter (Signed)
 Ok to schedule pt for whatever she is needing to be seen for.

## 2024-02-13 NOTE — Telephone Encounter (Unsigned)
 Copied from CRM [id number]. Topic: Appointments - Scheduling Inquiry for Clinic >> Feb 13, 2024 12:17 PM Deaijah H wrote: Reason for CRM: Andria Meuse [facility] called in to check if BMP Basic Metabolic Panel has been received as of yesterday 02/12/24 1:30. Reached out to CAL was advised they received it. Please call to have patient scheduled.

## 2024-02-15 ENCOUNTER — Other Ambulatory Visit: Payer: Self-pay

## 2024-02-15 MED ORDER — FUROSEMIDE 20 MG PO TABS: 20.0000 mg | ORAL_TABLET | ORAL | 3 refills | Status: DC | PRN

## 2024-02-15 NOTE — Telephone Encounter (Signed)
 Called and spoke with pt and all questions answered.  Copied from CRM [id number]. Topic: General - Other >> Feb 14, 2024  1:41 PM Jon Gills C wrote: Reason for CRM: Patient called in wanting to speak to Dr.Hunters nurse, is requesting a callback

## 2024-02-21 ENCOUNTER — Telehealth: Payer: Self-pay

## 2024-02-21 NOTE — Telephone Encounter (Signed)
 Id prefer for patient to simply do at the may visit with me

## 2024-02-21 NOTE — Telephone Encounter (Signed)
 See below, is this ok?  Copied from CRM [id number]. Topic: Clinical - Request for Lab/Test Order >> Feb 21, 2024 11:10 AM Lennart Pall wrote: Reason for CRM: Wynona Canes from [facility] wanting to add UACR to the labwork that was needed for the patients [employer]

## 2024-02-22 ENCOUNTER — Other Ambulatory Visit: Payer: Self-pay

## 2024-02-22 ENCOUNTER — Telehealth: Payer: Self-pay

## 2024-02-22 ENCOUNTER — Encounter: Payer: No Typology Code available for payment source | Attending: Family Medicine | Admitting: Dietician

## 2024-02-22 DIAGNOSIS — E119 Type 2 diabetes mellitus without complications: Secondary | ICD-10-CM | POA: Insufficient documentation

## 2024-02-22 DIAGNOSIS — E1122 Type 2 diabetes mellitus with diabetic chronic kidney disease: Secondary | ICD-10-CM

## 2024-02-22 NOTE — Patient Instructions (Signed)
 Avoid/limit processed meat such as sausage, bacon, hot dogs, ham. Continue to avoid added salt Avoid processed foods. It is best to cook your own. Portion size of condiments   Continue to stay active. Continue to eat a diet rich in non-starchy vegetables, fresh fruit, lean protein, whole grains.

## 2024-02-22 NOTE — Telephone Encounter (Signed)
 Updated referral placed

## 2024-02-22 NOTE — Telephone Encounter (Signed)
-----   Message from Tana Conch sent at 02/22/2024 12:49 PM EDT ----- Regarding: RE: referral request You can update referral [facility] ----- Message ----- From: Bonnita Levan, RD Sent: 02/22/2024   9:18 AM EDT To: Shelva Majestic, MD Subject: referral request                               Dr. Durene Cal, Patient is here today for follow up.  May we have an updated Nutrition referral.  Nutrition and Diabetes  Thank you, Oran Rein, RD, LDN, CDCES, DipACLM

## 2024-02-22 NOTE — Progress Notes (Unsigned)
 Diabetes Self-Management Education  Visit Type: Follow-up  Appt. Start Time: 0920 Appt. End Time: 0950  02/23/2024  [name], identified by name and date of birth, is a 83 y.o. female with a diagnosis of Diabetes:  .   ASSESSMENT Patient is here today alone.  She was last seen by this RD on 11/17/2023.  Patient states that she feels well today. Had covid about a month ago. She states that her blood pressure is good. She is checking her blood glucose daily 111 yesterday and 103 this am fasting  Referred diagnosis:  Type 2 diabetes and CKD   History includes:   type 2 diabetes, CKD, HTN, HLD. GERD. Medications include:  Metformin (d/c'd), Farxiga, Entresto, Lasix, spironolactone, MVI, fruit and veggie gummie, vitamin B-12 Labs noted:  A1C 6.4% 01/05/2024, 6.3% 06/20/2023, BUN 39 , Creatinine 1.91,  Potasium 4.7, GFR 23. 5%  01/05/2024 BUN 42, Creatinine 1.8, Potassium 4.7, GFR 25, vitamin B12 1,295 06/20/2023 on 06/20/2023   Weight hx: 133 lbs 02/22/2023 and 11/17/2023 129 lbs 06/20/2023 135 lbs 03/09/2023   Patient lives alone.  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  Avoids added salt but diet is higher sodium due to foods eaten out and processed meat.   Diabetes Self-Management Education - 02/23/24 1701       Visit Information   Visit Type Follow-up      Psychosocial Assessment   Patient Belief/Attitude about Diabetes Motivated to manage diabetes    Self-care barriers None    Self-management support Doctor's office;CDE visits    Other persons present Patient    Patient Concerns Nutrition/Meal planning    Special Needs None    Preferred Learning Style No preference indicated    Learning Readiness Ready    How often do you need to have someone help you when you read instructions, pamphlets, or other written materials from your doctor or pharmacy? 1 - Never      Pre-Education Assessment   Patient  understands the diabetes disease and treatment process. Demonstrates understanding / competency    Patient understands incorporating nutritional management into lifestyle. Needs Review    Patient undertands incorporating physical activity into lifestyle. Demonstrates understanding / competency    Patient understands using medications safely. Demonstrates understanding / competency    Patient understands monitoring blood glucose, interpreting and using results Demonstrates understanding / competency    Patient understands prevention, detection, and treatment of acute complications. Demonstrates understanding / competency    Patient understands prevention, detection, and treatment of chronic complications. Demonstrates understanding / competency    Patient understands how to develop strategies to address psychosocial issues. Demonstrates understanding / competency    Patient understands how to develop strategies to promote health/change behavior. Comprehends key points      Complications   Last HgB A1C per patient/outside source 6.4 %   01/05/2024   How often do you check your blood sugar? 1-2 times/day    Fasting Blood glucose range (mg/dL) 16-109    Postprandial Blood glucose range (mg/dL) 60-454;098-119    Number of hypoglycemic episodes per month 0      Dietary Intake   Breakfast Milk today but usually low sugar oatmeal and greek yogurt    Lunch Leftover chicken sandwich and cheese on whole wheat, mayo, grapes, yogurt    Dinner Chicken, green beans, sweet potato    Beverage(s) Water, occasional OJ when CBG is normal,  2% milk, Russian tea without added sugar.      Activity / Exercise   Activity / Exercise Type Light (walking / raking leaves)      Patient Education   Previous Diabetes Education Yes (please comment)   11/2023   Healthy Eating Meal options for control of blood glucose level and chronic complications.    Medications Reviewed patients medication for diabetes, action, purpose,  timing of dose and side effects.    Monitoring Taught/evaluated SMBG meter.    Diabetes Stress and Support Identified and addressed patients feelings and concerns about diabetes;Worked with patient to identify barriers to care and solutions      Individualized Goals (developed by patient)   Nutrition General guidelines for healthy choices and portions discussed    Physical Activity Exercise 3-5 times per week;30 minutes per day    Medications take my medication as prescribed    Monitoring  Test my blood glucose as discussed    Problem Solving Social situations    Reducing Risk do foot checks daily;treat hypoglycemia with 15 grams of carbs if blood glucose less than 70mg /dL      Patient Self-Evaluation of Goals - Patient rates self as meeting previously set goals (% of time)   Nutrition >75% (most of the time)    Physical Activity >75% (most of the time)    Medications >75% (most of the time)    Monitoring >75% (most of the time)    Problem Solving and behavior change strategies  >75% (most of the time)    Reducing Risk (treating acute and chronic complications) >75% (most of the time)    Health Coping >75% (most of the time)      Post-Education Assessment   Patient understands the diabetes disease and treatment process. Demonstrates understanding / competency    Patient understands incorporating nutritional management into lifestyle. Comprehends key points    Patient undertands incorporating physical activity into lifestyle. Demonstrates understanding / competency    Patient understands using medications safely. Demonstrates understanding / competency    Patient understands monitoring blood glucose, interpreting and using results Demonstrates understanding / competency    Patient understands prevention, detection, and treatment of acute complications. Demonstrates understanding / competency    Patient understands prevention, detection, and treatment of chronic complications. Demonstrates  understanding / competency    Patient understands how to develop strategies to address psychosocial issues. Demonstrates understanding / competency    Patient understands how to develop strategies to promote health/change behavior. Comprehends key points      Outcomes   Expected Outcomes Demonstrated interest in learning. Expect positive outcomes    Future DMSE 3-4 months    Program Status Completed      Subsequent Visit   Since your last visit have you continued or begun to take your medications as prescribed? Yes    Since your last visit have you experienced any weight changes? No change    Since your last visit, are you checking your blood glucose at least once a day? Yes             Individualized Plan for Diabetes Self-Management Training:   Learning Objective:  Patient will have a greater understanding of diabetes self-management. Patient education plan is to attend individual and/or group sessions per assessed needs and concerns.   Plan:   Patient Instructions  Avoid/limit processed meat such as sausage, bacon, hot dogs, ham. Continue to avoid added salt Avoid processed foods. It is best to cook your own. Portion  size of condiments    Continue to stay active. Continue to eat a diet rich in non-starchy vegetables, fresh fruit, lean protein, whole grains.  Expected Outcomes:  Demonstrated interest in learning. Expect positive outcomes  Education material provided:   If problems or questions, patient to contact team via:  Phone  Future DSME appointment: 3-4 months

## 2024-02-23 ENCOUNTER — Encounter: Payer: Self-pay | Admitting: Dietician

## 2024-03-02 ENCOUNTER — Other Ambulatory Visit: Payer: Self-pay | Admitting: Family Medicine

## 2024-03-14 ENCOUNTER — Other Ambulatory Visit: Payer: Self-pay | Admitting: Family Medicine

## 2024-04-01 ENCOUNTER — Ambulatory Visit: Payer: No Typology Code available for payment source | Attending: Internal Medicine | Admitting: Internal Medicine

## 2024-04-01 VITALS — BP 139/70 | HR 79 | Ht 61.0 in | Wt 133.0 lb

## 2024-04-01 DIAGNOSIS — I10 Essential (primary) hypertension: Secondary | ICD-10-CM | POA: Diagnosis not present

## 2024-04-01 DIAGNOSIS — I493 Ventricular premature depolarization: Secondary | ICD-10-CM | POA: Diagnosis not present

## 2024-04-01 DIAGNOSIS — E785 Hyperlipidemia, unspecified: Secondary | ICD-10-CM

## 2024-04-01 DIAGNOSIS — I5020 Unspecified systolic (congestive) heart failure: Secondary | ICD-10-CM | POA: Diagnosis not present

## 2024-04-01 MED ORDER — ATORVASTATIN CALCIUM 80 MG PO TABS: 80.0000 mg | ORAL_TABLET | Freq: Every day | ORAL | 3 refills | Status: AC

## 2024-04-01 NOTE — Progress Notes (Signed)
 Cardiology Office Note:  .    Date:  04/01/2024  ID:  Maria [name], DOB February 08, 1941, MRN [id number] PCP: Almira Jaeger, MD  [facility] Providers Cardiologist:  Jann Melody, MD     CC: Secondary prevention (HF)  History of Present Illness: .    Maria [name] is a 83 y.o. female with a hx of HTN, aortic atherosclerosis and HLD, CKD stage IIIa.  She has a history of heart failure with reduced ejection fraction, initially noted in 2022 with an ejection fraction of 25%, which improved to 40% by 2024 with goal-directed medical therapy. She is currently classified as NYHA class I. No recent issues with PVCs, PACs, or non-sustained ventricular tachycardia have been noted since starting this therapy in 2023. She has experienced asymptomatic supraventricular tachycardia.  She mentions a recent episode of wheezing, which she attributes to a cough that developed after having COVID-19 at the end of February 2025. The cough has persisted for one to two months, and she has been experiencing some wheezing since then. No significant breathing issues aside from recent wheezing and a persistent cough following the COVID-19 infection.  Her current medications include Entresto  and atorvastatin , although she notes needing a refill for atorvastatin . She also wants to try a new medication for diabetes management, which she believes may also benefit her heart condition.  She describes a recent experience where she was mistakenly at the wrong building for her appointment, which caused her stress and resulted in her leaving her medication in the car. Despite this, she reports feeling generally well and mentions her weight is stable at 133 pounds.  She recalls a recent medical procedure where she was concerned about a potential bone marrow biopsy, but it turned out to be an extensive x-ray instead. She was relieved not to undergo the biopsy and describes the experience  humorously.  Discussed the use of AI scribe software for clinical note transcription with the patient, who gave verbal consent to proceed.   Relevant histories: .  Social: 2022: Found to have LVEF 25%.   Started on Entresto , no CAD on 02/25/22 cath. RA 12, PCWP 19.  Increase Entresto .  Sent to Pharm D clinic for financial assistance.  After that lost to follow up. 2023: started on GDMT with improvement in LVEF ~ 40% 2024: Back at Child care; she was NYHA class I; had a death in the family ROS: As per HPI.   Studies Reviewed: .   Cardiac Studies & Procedures   ______________________________________________________________________________________________ CARDIAC CATHETERIZATION  CARDIAC CATHETERIZATION 02/25/2022  Conclusion No angiographic evidence of CAD  RA 12 RV 57/0/14 PA 62/24 (mean 40) PCWP 19 LV 131/16/18 AO 119/59  Recommendations: Continue medical management of non-ischemic cardiomyopathy  Findings Coronary Findings Diagnostic  Dominance: Right  Left Anterior Descending Vessel is large.  Left Circumflex Vessel is large.  Right Coronary Artery Vessel is large.  Intervention  No interventions have been documented.     ECHOCARDIOGRAM  ECHOCARDIOGRAM COMPLETE 09/08/2022  Narrative ECHOCARDIOGRAM REPORT    Patient Name:   Maria [name] Date of Exam: 09/08/2022 Medical Rec #:  [id number]        Height:       61.0 in Accession #:    [id number]       Weight:       137.8 lb Date of Birth:  1941-08-30         BSA:          1.612 m Patient  Age:    81 years         BP:           120/68 mmHg Patient Gender: F                HR:           71 bpm. Exam Location:  [address]  Procedure: 2D Echo, 3D Echo, Cardiac Doppler, Color Doppler and Strain Analysis  Indications:    I50.9 CHF  History:        Patient has prior history of Echocardiogram examinations, most recent 02/17/2022. Risk Factors:Hypertension and Dyslipidemia.  Sonographer:    Brigid Canada RDCS Referring Phys: [id number] [name] A Liddy Deam  IMPRESSIONS   1. Left ventricular ejection fraction, by estimation, is 40 to 45%. The left ventricle has mildly decreased function. The left ventricle demonstrates regional wall motion abnormalities (see scoring diagram/findings for description). The mid-to-apical inferoseptal, mid-to-apical anteroseptal, and apical inferior LV segments appear severely hypokinetic. Left ventricular diastolic parameters are consistent with Grade I diastolic dysfunction (impaired relaxation). Overall, EF and wall motion significantly improved compared to prior TTE on 02/2022. 2. Right ventricular systolic function is mildly reduced. The right ventricular size is normal. Tricuspid regurgitation signal is inadequate for assessing PA pressure. 3. Left atrial size was mildly dilated. 4. The mitral valve is degenerative. Mild mitral valve regurgitation. 5. The aortic valve is tricuspid. There is mild calcification of the aortic valve. There is mild thickening of the aortic valve. Aortic valve regurgitation is not visualized. Aortic valve sclerosis/calcification is present, without any evidence of aortic stenosis. 6. The inferior vena cava is normal in size with greater than 50% respiratory variability, suggesting right atrial pressure of 3 mmHg.  Comparison(s): Compared to prior TTE on 02/2022, the EF has improved to 40-45% with improved wall motion, the MR has decreased to mild (previously moderate to severe), the TR has decreased to trivial (previously moderate).  FINDINGS Left Ventricle: The mid-to-apical inferoseptal, mid-to-apical anteroseptal, and apical inferior LV segments appear severely hypokinetic. Left ventricular ejection fraction, by estimation, is 40 to 45%. The left ventricle has mildly decreased function. The left ventricle demonstrates regional wall motion abnormalities. Global longitudinal strain performed but not reported based on  interpreter judgement due to suboptimal tracking. 3D left ventricular ejection fraction analysis performed but not reported based on interpreter judgement due to suboptimal tracking. The left ventricular internal cavity size was normal in size. There is no left ventricular hypertrophy. Left ventricular diastolic parameters are consistent with Grade I diastolic dysfunction (impaired relaxation).  Right Ventricle: The right ventricular size is normal. No increase in right ventricular wall thickness. Right ventricular systolic function is mildly reduced. Tricuspid regurgitation signal is inadequate for assessing PA pressure.  Left Atrium: Left atrial size was mildly dilated.  Right Atrium: Right atrial size was normal in size.  Pericardium: There is no evidence of pericardial effusion.  Mitral Valve: The mitral valve is degenerative in appearance. There is mild thickening of the mitral valve leaflet(s). There is mild calcification of the mitral valve leaflet(s). Mild to moderate mitral annular calcification. Mild mitral valve regurgitation.  Tricuspid Valve: The tricuspid valve is normal in structure. Tricuspid valve regurgitation is trivial.  Aortic Valve: The aortic valve is tricuspid. There is mild calcification of the aortic valve. There is mild thickening of the aortic valve. Aortic valve regurgitation is not visualized. Aortic valve sclerosis/calcification is present, without any evidence of aortic stenosis.  Pulmonic Valve: The pulmonic valve was normal in  structure. Pulmonic valve regurgitation is trivial.  Aorta: The aortic root and ascending aorta are structurally normal, with no evidence of dilitation.  Venous: The inferior vena cava is normal in size with greater than 50% respiratory variability, suggesting right atrial pressure of 3 mmHg.  IAS/Shunts: The atrial septum is grossly normal.   LEFT VENTRICLE PLAX 2D LVIDd:         4.50 cm   Diastology LVIDs:         3.10 cm   LV  e' medial:    5.66 cm/s LV PW:         0.90 cm   LV E/e' medial:  10.8 LV IVS:        0.90 cm   LV e' lateral:   8.81 cm/s LVOT diam:     1.60 cm   LV E/e' lateral: 7.0 LV SV:         35 LV SV Index:   22        2D Longitudinal Strain LVOT Area:     2.01 cm  2D Strain GLS (A2C):   -20.3 % 2D Strain GLS (A3C):   -20.3 % 2D Strain GLS (A4C):   -21.2 % 2D Strain GLS Avg:     -20.6 %  3D Volume EF: 3D EF:        55 % LV EDV:       128 ml LV ESV:       57 ml LV SV:        71 ml  RIGHT VENTRICLE            IVC RV Basal diam:  3.10 cm    IVC diam: 1.50 cm RV S prime:     8.70 cm/s TAPSE (M-mode): 1.0 cm  LEFT ATRIUM             Index        RIGHT ATRIUM           Index LA diam:        4.10 cm 2.54 cm/m   RA Area:     10.70 cm LA Vol (A2C):   46.7 ml 28.97 ml/m  RA Volume:   24.50 ml  15.20 ml/m LA Vol (A4C):   36.2 ml 22.45 ml/m LA Biplane Vol: 42.1 ml 26.11 ml/m AORTIC VALVE LVOT Vmax:   77.80 cm/s LVOT Vmean:  51.350 cm/s LVOT VTI:    0.172 m  AORTA Ao Root diam: 2.50 cm Ao Asc diam:  2.80 cm  MITRAL VALVE MV Area (PHT): 3.37 cm    SHUNTS MV Decel Time: 225 msec    Systemic VTI:  0.17 m MV E velocity: 61.30 cm/s  Systemic Diam: 1.60 cm MV A velocity: 90.70 cm/s MV E/A ratio:  0.68  Riccardo Chamberlain MD Electronically signed by Riccardo Chamberlain MD Signature Date/Time: 09/08/2022/2:23:08 PM    Final    MONITORS  LONG TERM MONITOR (3-14 DAYS) 04/05/2022  Narrative  Patient had a minimum heart rate of 71 bpm, maximum heart rate of 240 bpm (NSVT) , and average heart rate of 97 bpm.  Predominant underlying rhythm was sinus rhythm.  Many runs of NSVT lasting 21 beats beats at longest. Some reported NSVT runs are artifact.  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  Isolated PACs were occasional (4.5%).  Isolated PVCs were occasional (4.5%).  No evidence of significant heart block  .  No triggered and diary events.  Asymptomatic SVT and NSVT.       ______________________________________________________________________________________________        Physical Exam:    VS:  BP 139/70 (BP Location: Left Arm)   Pulse 79   Ht 5\' 1"  (1.549 m)   Wt 60.3 kg   SpO2 96%   BMI 25.13 kg/m    Wt Readings from Last 3 Encounters:  04/01/24 60.3 kg  01/23/24 59.2 kg  01/05/24 58.5 kg    Gen: no distress   Neck: No JVD Cardiac: No Rubs or Gallops, no Murmur, RRR + radial pulses Respiratory: Inspiratory wheze bilaterally, normal effort,   respiratory rate GI: Soft, nontender, non-distended  MS: No  edema;  moves all extremities Integument: Skin feels warm Neuro:  At time of evaluation, alert and oriented to person/place/time/situation  Psych: Normal affect, patient feels well   ASSESSMENT AND PLAN: .    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 Currently NYHA class I. No recent issues with PVCs, PACs, or non-sustained ventricular tachycardia since initiation of therapy in 2023. - has made diet and lifestyle interventions, we have once again discussed her supplements from [employer] and no high evidence of efficacy  Asymptomatic supraventricular tachycardia - continue current therapy  Aortic atherosclerosis - refilling statin, discussed lifestyle  Hypertension Slightly elevated blood pressure, likely due to stress related to recent events (went to the wrong office)  Hyperlipidemia Well-controlled. Atorvastatin  refill needed. - Refill atorvastatin  prescription.  Chronic kidney disease, stage 3A - continue SGLT2i   One year with me unless new issues  Gloriann Larger, MD FASE [name] Cardi[facility]  [address] [place], [address] [place], [place] [address] [phone]   9:59 AM

## 2024-04-01 NOTE — Patient Instructions (Signed)
 Medication Instructions:  Your physician recommends that you continue on your current medications as directed. Please refer to the Current Medication list given to you today. Refilled: atorvastatin  (Lipitor)   *If you need a refill on your cardiac medications before your next appointment, please call your pharmacy*   Lab Work: NONE If you have labs (blood work) drawn today and your tests are completely normal, you will receive your results only by: MyChart Message (if you have MyChart) OR A paper copy in the mail If you have any lab test that is abnormal or we need to change your treatment, we will call you to review the results.   Testing/Procedures: NONE   Follow-Up: At [facility], you and your health needs are our priority.  As part of our continuing mission to provide you with exceptional heart care, we have created designated Provider Care Teams.  These Care Teams include your primary Cardiologist (physician) and Advanced Practice Providers (APPs -  Physician Assistants and Nurse Practitioners) who all work together to provide you with the care you need, when you need it.  We recommend signing up for the patient portal called "MyChart".  Sign up information is provided on this After Visit Summary.  MyChart is used to connect with patients for Virtual Visits (Telemedicine).  Patients are able to view lab/test results, encounter notes, upcoming appointments, etc.  Non-urgent messages can be sent to your provider as well.   To learn more about what you can do with MyChart, go to [email or web address].    Your next appointment:   1 year(s)  Provider:   Gloriann Larger, MD

## 2024-04-05 ENCOUNTER — Other Ambulatory Visit (HOSPITAL_COMMUNITY): Payer: Self-pay

## 2024-04-05 ENCOUNTER — Telehealth: Payer: Self-pay | Admitting: Internal Medicine

## 2024-04-05 MED ORDER — DAPAGLIFLOZIN PROPANEDIOL 10 MG PO TABS
10.0000 mg | ORAL_TABLET | Freq: Every day | ORAL | 3 refills | Status: AC
  Filled 2024-04-05: qty 90, 90d supply, fill #0
  Filled 2024-07-09 – 2024-07-10 (×2): qty 90, 90d supply, fill #1
  Filled 2024-10-08: qty 90, 90d supply, fill #2
  Filled 2025-01-10: qty 90, 90d supply, fill #3

## 2024-04-05 NOTE — Telephone Encounter (Signed)
 Pt's medication was sent to pt's pharmacy as requested. Confirmation received.

## 2024-04-05 NOTE — Telephone Encounter (Signed)
*  STAT* If patient is at the pharmacy, call can be transferred to refill team.   1. Which medications need to be refilled? (please list name of each medication and dose if known)   dapagliflozin  propanediol (FARXIGA ) 10 MG TABS tablet   2. Would you like to learn more about the convenience, safety, & potential cost savings by using the [facility]?   3. Are you open to using the [facility] (Type [facility]. ).  4. Which pharmacy/location (including street and city if local pharmacy) is medication to be sent to?  [place] [facility]   5. Do they need a 30 day or 90 day supply?  90 day  Patient stated she is completely out of this medication.

## 2024-05-03 ENCOUNTER — Ambulatory Visit: Payer: Self-pay | Admitting: Family Medicine

## 2024-05-03 ENCOUNTER — Encounter: Payer: Self-pay | Admitting: Family Medicine

## 2024-05-03 ENCOUNTER — Ambulatory Visit (INDEPENDENT_AMBULATORY_CARE_PROVIDER_SITE_OTHER): Payer: No Typology Code available for payment source | Admitting: Family Medicine

## 2024-05-03 VITALS — BP 118/62 | HR 83 | Temp 98.4°F | Ht 61.0 in | Wt 134.2 lb

## 2024-05-03 DIAGNOSIS — E1122 Type 2 diabetes mellitus with diabetic chronic kidney disease: Secondary | ICD-10-CM

## 2024-05-03 DIAGNOSIS — I10 Essential (primary) hypertension: Secondary | ICD-10-CM | POA: Diagnosis not present

## 2024-05-03 DIAGNOSIS — N183 Chronic kidney disease, stage 3 unspecified: Secondary | ICD-10-CM | POA: Diagnosis not present

## 2024-05-03 DIAGNOSIS — I5022 Chronic systolic (congestive) heart failure: Secondary | ICD-10-CM | POA: Diagnosis not present

## 2024-05-03 DIAGNOSIS — E785 Hyperlipidemia, unspecified: Secondary | ICD-10-CM

## 2024-05-03 DIAGNOSIS — I428 Other cardiomyopathies: Secondary | ICD-10-CM

## 2024-05-03 DIAGNOSIS — N184 Chronic kidney disease, stage 4 (severe): Secondary | ICD-10-CM

## 2024-05-03 DIAGNOSIS — R809 Proteinuria, unspecified: Secondary | ICD-10-CM

## 2024-05-03 LAB — CBC WITH DIFFERENTIAL/PLATELET
Basophils Absolute: 0 10*3/uL (ref 0.0–0.1)
Basophils Relative: 0.7 % (ref 0.0–3.0)
Eosinophils Absolute: 0.1 10*3/uL (ref 0.0–0.7)
Eosinophils Relative: 2 % (ref 0.0–5.0)
HCT: 40.2 % (ref 36.0–46.0)
Hemoglobin: 13.1 g/dL (ref 12.0–15.0)
Lymphocytes Relative: 14.1 % (ref 12.0–46.0)
Lymphs Abs: 0.8 10*3/uL (ref 0.7–4.0)
MCHC: 32.6 g/dL (ref 30.0–36.0)
MCV: 86.6 fl (ref 78.0–100.0)
Monocytes Absolute: 0.4 10*3/uL (ref 0.1–1.0)
Monocytes Relative: 7.4 % (ref 3.0–12.0)
Neutro Abs: 4.4 10*3/uL (ref 1.4–7.7)
Neutrophils Relative %: 75.8 % (ref 43.0–77.0)
Platelets: 188 10*3/uL (ref 150.0–400.0)
RBC: 4.64 Mil/uL (ref 3.87–5.11)
RDW: 14.1 % (ref 11.5–15.5)
WBC: 5.8 10*3/uL (ref 4.0–10.5)

## 2024-05-03 LAB — RENAL FUNCTION PANEL
Albumin: 4 g/dL (ref 3.5–5.2)
BUN: 28 mg/dL — ABNORMAL HIGH (ref 6–23)
CO2: 26 meq/L (ref 19–32)
Calcium: 9.8 mg/dL (ref 8.4–10.5)
Chloride: 105 meq/L (ref 96–112)
Creatinine, Ser: 1.48 mg/dL — ABNORMAL HIGH (ref 0.40–1.20)
GFR: 32.38 mL/min — ABNORMAL LOW (ref >60.00)
Glucose, Bld: 94 mg/dL (ref 70–99)
Phosphorus: 3.4 mg/dL (ref 2.3–4.6)
Potassium: 3.8 meq/L (ref 3.5–5.1)
Sodium: 141 meq/L (ref 135–145)

## 2024-05-03 LAB — POCT GLYCOSYLATED HEMOGLOBIN (HGB A1C): Hemoglobin A1C: 5.5 % (ref 4.0–5.6)

## 2024-05-03 LAB — URINALYSIS, ROUTINE W REFLEX MICROSCOPIC
Bilirubin Urine: NEGATIVE
Hgb urine dipstick: NEGATIVE
Ketones, ur: NEGATIVE
Leukocytes,Ua: NEGATIVE
Nitrite: NEGATIVE
RBC / HPF: NONE SEEN (ref 0–?)
Specific Gravity, Urine: 1.01 (ref 1.000–1.030)
Total Protein, Urine: NEGATIVE
Urine Glucose: >=1000 — AB
Urobilinogen, UA: 0.2 (ref 0.0–1.0)
pH: 6 (ref 5.0–8.0)

## 2024-05-03 LAB — MICROALBUMIN / CREATININE URINE RATIO
Creatinine,U: 41.8 mg/dL
Microalb Creat Ratio: 38.4 mg/g — ABNORMAL HIGH (ref 0.0–30.0)
Microalb, Ur: 1.6 mg/dL (ref 0.0–1.9)

## 2024-05-03 NOTE — Progress Notes (Signed)
 Phone [phone] In person visit   Subjective:   Maria [name] is a 83 y.o. 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    Priority: High   History of gastrointestinal hemorrhage     Priority: High   Fatty liver 01/07/2022    Priority: Medium    Microalbuminuria 12/17/2020    Priority: Medium    Aortic atherosclerosis (HCC) 06/18/2020    Priority: Medium    CKD (chronic kidney disease), stage IV (HCC) 10/04/2019    Priority: Medium    GERD (gastroesophageal reflux disease) 05/27/2014    Priority: Medium    Hyperlipidemia LDL goal <100 08/21/2008    Priority: Medium    Essential hypertension 08/21/2008    Priority: Medium    PVC (premature ventricular contraction) 02/14/2022    Priority: Low   Arthritis of right hip 10/10/2019    Priority: Low   Bursitis of right hip 10/10/2019    Priority: Low   Allergic rhinitis 10/03/2019    Priority: Low   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 90 tablet 3   benzonatate  (TESSALON  PERLES) 100 MG capsule Take 1 capsule (100 mg total) by mouth 3 (three) times daily as needed. 20 capsule 0   Blood Glucose Monitoring Suppl DEVI 1 each by Does not apply route in the morning, at noon, and at bedtime. May substitute  to any manufacturer covered by patient's insurance. 1 each 0   Calcium  Carbonate-Vitamin D (CALCIUM  PLUS VITAMIN D PO) Take 2 tablets by mouth daily.     cyanocobalamin  100 MCG tablet Take 100 mcg by mouth daily.     dapagliflozin  propanediol (FARXIGA ) 10 MG TABS tablet Take 1 tablet (10 mg total) by mouth daily before breakfast. 90 tablet 3   furosemide  (LASIX ) 20 MG tablet Take 1 tablet (20 mg total) by mouth as needed. 30 tablet 3   metoprolol  (TOPROL  XL) 200 MG 24 hr tablet Take 1 tablet (200 mg total) by mouth daily. 90 tablet 3   Multiple Vitamins-Minerals (MEGAVITE FRUITS & VEGGIES PO) Take by mouth. Balance nature fruits and veggies     omeprazole  (PRILOSEC) 20 MG capsule TAKE 1 CAPSULE(20 MG) BY MOUTH DAILY 90 capsule 1   sacubitril -valsartan  (ENTRESTO ) 97-103 MG Take 1 tablet by mouth 2 (two) times daily. 180 tablet 3   spironolactone  (ALDACTONE ) 25 MG tablet TAKE 1 TABLET(25 MG) BY MOUTH DAILY 90 tablet 1   No current facility-administered medications for this visit.     Objective:  BP 118/62   Pulse 83   Temp 98.4 F (36.9 C)   Ht 5\' 1"  (1.549 m)   Wt 134 lb 3.2 oz (60.9 kg)   SpO2 99%   BMI 25.36 kg/m  Gen: NAD, resting comfortably CV: RRR no murmur Lungs: CTAB no crackles, wheeze, rhonchi Abdomen: soft/nontender/nondistended/normal bowel sounds. No rebound or guarding.  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 Cindra Cree with small m spike  # Diabetes- new diagnosis 12/19/22 with 2nd a1c of 6.5 or above S: Medication:  on farxiga   10 mg for CHF Exercise and diet- trying to eat well and stay active -patient also had elevated microalbumin to creatinine ratio Lab Results  Component Value Date   HGBA1C POC 5.5 05/03/2024   HGBA1C 6.4 01/05/2024    HGBA1C 6.3 06/20/2023  A/P: Point of Care (POC) a1c looks excellent today. Continue current medications  - update microalbumin/creatinine ratio today- adjusted to 14 last visit but later increased in November with Dr. Cindra Cree  # CKD stage IV-refer to nephrology on 06/20/2023- sees Dr. Cindra Cree early next week With microalbuminuria- on entresto  plus farxiga  - update today -check renal function panel, pth and plan to send labs to Dr. Cindra Cree -holding off on LFTs as just checked in January and reassuing  # CHF/NICM- Dr. Paulita Boss cardiology- diagnosed 2023 S:Medication: farxiga  10 mg, lasix  20 mg daily prn, entresto  97-103 mg, spironolactone  25 mg (not 100% clear if on), metoprolol  200 mg XR - has had  cath 02/25/22 with no evidence of CAD- remain off aspirin  A/P: CHF appears euvolemic- weight stbale and no edema. She's going to see at [facility] if taking spironolactone  but if not- stay off for now with blood pressure control   #Essential hypertension/CKD stage IV with elevated microalbumin to creatinine ratio S: Compliant with lasix  20 mg daily, metoprolol  200 mg XR, entresto  97-103 mg, spironolactone  25 mg BP Readings from Last 3 Encounters:  05/03/24 118/62  04/01/24 139/70  01/23/24 (!) 154/82  A/P: hypertension  well controlled continue current medications   #Hyperlipidemia aortic atherosclerosis S: Compliant with atorvastatin  80 mg - increased from 40 mg with LDL over 100  Lab Results  Component Value Date   CHOL 143 01/05/2024   HDL 54.70 01/05/2024   LDLCALC 80 01/05/2024   LDLDIRECT 71.0 06/20/2023   TRIG 40.0 01/05/2024   CHOLHDL 3 01/05/2024  A/P:  lipids close to ideal goal  - continue current medications  -woke up this morning with some left forearm and antecubital fossa pain without chest pain or shortness of breath - thinks may have slept awkwardly but if has new or worsening symptoms agrees to see us  back  #GERD S: Compliant with omeprazole  20 mg.  B12 last checked June  2022 -history of severe GI bleed years ago so keeping her on PPI despite CKD III A/P:gastroesophageal reflux disease well controlled continue current medications    #new mattress and woke up with a few bumps on neck- ? Bites. Itches. Tried alcohol but advised cortisone cream instead. Thinks has an old steroid cream available- new or worsening symptoms see us  back. May need to investigate mattress further- reports came in bag.   Recommended follow up: Return in about 14 weeks (around 08/09/2024) for followup or sooner if needed.Schedule b4 you leave. Future Appointments  Date Time Provider Department Center  05/20/2024  2:30 PM [facility] LAB [facility] None  05/28/2024  1:20 PM Darilyn Edin, PA-C [facility] None  06/28/2024  9:00 AM Jobe,  Audley Leap, RD [facility] NDM  09/17/2024  8:40 AM [facility] ANNUAL WELLNESS VISIT 1 [facility] PEC    Lab/Order associations:   ICD-10-CM   1. Type 2 diabetes mellitus with stage 3 chronic kidney disease, without long-term current use of insulin, unspecified whether stage 3a or 3b CKD (HCC)  E11.22 POCT HgB A1C   N18.30 Microalbumin / creatinine urine ratio    Renal Function Panel    CBC with Differential/Platelet    2. CKD (chronic kidney disease), stage IV (HCC)  N18.4 Renal Function Panel    CBC with Differential/Platelet    PTH, intact (no Ca)    Urinalysis, Routine w reflex microscopic    3. Chronic systolic CHF (congestive heart failure), NYHA class 3 (HCC)  I50.22     4. NICM (nonischemic cardiomyopathy) (HCC)  I42.8     5. Essential hypertension  I10     6. Hyperlipidemia LDL goal <100  E78.5     7. Microalbuminuria  R80.9       No orders of the defined types were placed in this encounter.   Return precautions advised.  Clarisa Crooked, MD

## 2024-05-03 NOTE — Patient Instructions (Addendum)
 Please stop by lab before you go If you have mychart- we will send your results within 3 business days of us  receiving them.  If you do not have mychart- we will call you about results within 5 business days of us  receiving them.  *please also note that you will see labs on mychart as soon as they post. I will later go in and write notes on them- will say "notes from Dr. Arlene Ben"   The only medication(s) we don't see on your bag in spironolactone - double check at home- cardiology prescribed this. Your blood pressure is really good today though so Im somewhat hesitant to restart at this point  Recommended follow up: Return in about 14 weeks (around 08/09/2024) for followup or sooner if needed.Schedule b4 you leave.

## 2024-05-04 LAB — PARATHYROID HORMONE, INTACT (NO CA): PTH: 53 pg/mL (ref 16–77)

## 2024-05-06 DIAGNOSIS — R778 Other specified abnormalities of plasma proteins: Secondary | ICD-10-CM | POA: Diagnosis not present

## 2024-05-06 DIAGNOSIS — I509 Heart failure, unspecified: Secondary | ICD-10-CM | POA: Diagnosis not present

## 2024-05-06 DIAGNOSIS — K760 Fatty (change of) liver, not elsewhere classified: Secondary | ICD-10-CM | POA: Diagnosis not present

## 2024-05-06 DIAGNOSIS — E1122 Type 2 diabetes mellitus with diabetic chronic kidney disease: Secondary | ICD-10-CM | POA: Diagnosis not present

## 2024-05-06 DIAGNOSIS — N1832 Chronic kidney disease, stage 3b: Secondary | ICD-10-CM | POA: Diagnosis not present

## 2024-05-06 DIAGNOSIS — I129 Hypertensive chronic kidney disease with stage 1 through stage 4 chronic kidney disease, or unspecified chronic kidney disease: Secondary | ICD-10-CM | POA: Diagnosis not present

## 2024-05-19 ENCOUNTER — Other Ambulatory Visit: Payer: Self-pay | Admitting: Hematology and Oncology

## 2024-05-19 DIAGNOSIS — D472 Monoclonal gammopathy: Secondary | ICD-10-CM

## 2024-05-20 ENCOUNTER — Inpatient Hospital Stay: Payer: No Typology Code available for payment source | Attending: Physician Assistant

## 2024-05-20 DIAGNOSIS — D472 Monoclonal gammopathy: Secondary | ICD-10-CM | POA: Insufficient documentation

## 2024-05-20 LAB — CMP (CANCER CENTER ONLY)
ALT: 10 U/L (ref 0–44)
AST: 18 U/L (ref 15–41)
Albumin: 3.8 g/dL (ref 3.5–5.0)
Alkaline Phosphatase: 115 U/L (ref 38–126)
Anion gap: 8 (ref 5–15)
BUN: 30 mg/dL — ABNORMAL HIGH (ref 8–23)
CO2: 27 mmol/L (ref 22–32)
Calcium: 9.1 mg/dL (ref 8.9–10.3)
Chloride: 105 mmol/L (ref 98–111)
Creatinine: 1.56 mg/dL — ABNORMAL HIGH (ref 0.44–1.00)
GFR, Estimated: 33 mL/min — ABNORMAL LOW (ref >60)
Glucose, Bld: 107 mg/dL — ABNORMAL HIGH (ref 70–99)
Potassium: 4.5 mmol/L (ref 3.5–5.1)
Sodium: 140 mmol/L (ref 135–145)
Total Bilirubin: 0.4 mg/dL (ref 0.0–1.2)
Total Protein: 6.7 g/dL (ref 6.5–8.1)

## 2024-05-20 LAB — CBC WITH DIFFERENTIAL (CANCER CENTER ONLY)
Abs Immature Granulocytes: 0.01 10*3/uL (ref 0.00–0.07)
Basophils Absolute: 0 10*3/uL (ref 0.0–0.1)
Basophils Relative: 1 %
Eosinophils Absolute: 0.1 10*3/uL (ref 0.0–0.5)
Eosinophils Relative: 2 %
HCT: 37.1 % (ref 36.0–46.0)
Hemoglobin: 12.5 g/dL (ref 12.0–15.0)
Immature Granulocytes: 0 %
Lymphocytes Relative: 23 %
Lymphs Abs: 1 10*3/uL (ref 0.7–4.0)
MCH: 28.1 pg (ref 26.0–34.0)
MCHC: 33.7 g/dL (ref 30.0–36.0)
MCV: 83.4 fL (ref 80.0–100.0)
Monocytes Absolute: 0.5 10*3/uL (ref 0.1–1.0)
Monocytes Relative: 11 %
Neutro Abs: 2.8 10*3/uL (ref 1.7–7.7)
Neutrophils Relative %: 63 %
Platelet Count: 228 10*3/uL (ref 150–400)
RBC: 4.45 MIL/uL (ref 3.87–5.11)
RDW: 13.8 % (ref 11.5–15.5)
WBC Count: 4.5 10*3/uL (ref 4.0–10.5)
nRBC: 0 % (ref 0.0–0.2)

## 2024-05-20 LAB — LACTATE DEHYDROGENASE: LDH: 168 U/L (ref 98–192)

## 2024-05-21 LAB — KAPPA/LAMBDA LIGHT CHAINS
Kappa free light chain: 51.9 mg/L — ABNORMAL HIGH (ref 3.3–19.4)
Kappa, lambda light chain ratio: 1.47 (ref 0.26–1.65)
Lambda free light chains: 35.2 mg/L — ABNORMAL HIGH (ref 5.7–26.3)

## 2024-05-22 LAB — MULTIPLE MYELOMA PANEL, SERUM
Albumin SerPl Elph-Mcnc: 3 g/dL (ref 2.9–4.4)
Albumin/Glob SerPl: 1.1 (ref 0.7–1.7)
Alpha 1: 0.2 g/dL (ref 0.0–0.4)
Alpha2 Glob SerPl Elph-Mcnc: 0.6 g/dL (ref 0.4–1.0)
B-Globulin SerPl Elph-Mcnc: 0.9 g/dL (ref 0.7–1.3)
Gamma Glob SerPl Elph-Mcnc: 1 g/dL (ref 0.4–1.8)
Globulin, Total: 2.8 g/dL (ref 2.2–3.9)
IgA: 208 mg/dL (ref 64–422)
IgG (Immunoglobin G), Serum: 1145 mg/dL (ref 586–1602)
IgM (Immunoglobulin M), Srm: 72 mg/dL (ref 26–217)
M Protein SerPl Elph-Mcnc: 0.3 g/dL — ABNORMAL HIGH
Total Protein ELP: 5.8 g/dL — ABNORMAL LOW (ref 6.0–8.5)

## 2024-05-28 ENCOUNTER — Ambulatory Visit: Payer: No Typology Code available for payment source | Admitting: Physician Assistant

## 2024-05-28 ENCOUNTER — Inpatient Hospital Stay (HOSPITAL_BASED_OUTPATIENT_CLINIC_OR_DEPARTMENT_OTHER): Admitting: Physician Assistant

## 2024-05-28 VITALS — BP 168/80 | HR 98 | Temp 97.5°F | Resp 16 | Wt 135.0 lb

## 2024-05-28 DIAGNOSIS — D472 Monoclonal gammopathy: Secondary | ICD-10-CM

## 2024-05-28 NOTE — Progress Notes (Signed)
 [facility] [phone]   [phone]  PROGRESS NOTE  Patient Care Team: Katrinka Garnette KIDD, MD as PCP - General (Family Medicine) Santo Stanly LABOR, MD as PCP - Cardiology (Cardiology) Austin Olam CROME, MD as Consulting Physician (Ophthalmology) Beverley Evalene BIRCH, MD as Consulting Physician (Orthopedic Surgery) Pandora Cadet, [facility] as Pharmacist (Pharmacist)  Hematological/Oncological History # Monoclonal Gammopathy of Undetermined Significance 10/23/2023: M protein 0.4, kappa 56.6, Lambda 38.7, ratio 1.46 12/04/2023: establish care with Dr. Federico   CHIEF COMPLAINTS/PURPOSE OF CONSULTATION:  MGUS  HISTORY OF PRESENTING ILLNESS:  Senovia H Sobocinski 83 y.o. 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  She is unaccompanied for this visit.  Ms. Tennis reports she is doing well without any new or concerning symptoms.  She can complete all her daily activities on her own.  She is an active member of church and works full-time.  She denies any appetite changes or weight loss.  She denies nausea, vomiting or bowel habit changes.  She denies easy bruising or signs of active bleeding.  She denies fevers, chills, night sweats, shortness of breath, chest pain, cough, headaches, dizziness or any new bone/back pain.  She has no other complaints.  A full 10 point ROS is otherwise negative.  MEDICAL HISTORY:  Past Medical History:  Diagnosis Date   Arthritis    Bronchitis    hx of   Chronic kidney disease    Diabetes mellitus without complication (HCC)    GERD (gastroesophageal reflux disease)    History of bleeding ulcers    History of gastrointestinal hemorrhage    now off aspirin    Hyperlipidemia    Hypertension    Osteoarthritis of right knee 06/03/2012    SURGICAL HISTORY: Past Surgical History:  Procedure Laterality Date   ABDOMINAL HYSTERECTOMY     nonmalignant reasons   APPENDECTOMY     RIGHT/LEFT HEART CATH AND CORONARY ANGIOGRAPHY N/A 02/25/2022    Procedure: RIGHT/LEFT HEART CATH AND CORONARY ANGIOGRAPHY;  Surgeon: Verlin Lonni BIRCH, MD;  Location: [facility];  Service: Cardiovascular;  Laterality: N/A;   TOTAL KNEE ARTHROPLASTY  05/30/2012   Procedure: TOTAL KNEE ARTHROPLASTY;  Surgeon: Toribio JULIANNA Beverley, MD;  Location: [facility] OR;  Service: Orthopedics;  Laterality: Right;  DR MURPHY WANTS 90 MINUTES FOR THIS CASE. 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   Procedure: LEFT TOTAL KNEE ARTHROPLASTY;  Surgeon: Toribio JULIANNA Beverley, MD;  Location: [facility] OR;  Service: Orthopedics;  Laterality: Left;    SOCIAL HISTORY: Social History   Socioeconomic History   Marital status: Divorced    Spouse name: Not on file   Number of children: Not on file   Years of education: Not on file   Highest education level: Not on file  Occupational History   Occupation: day care   Tobacco Use   Smoking status: Never   Smokeless tobacco: Never  Substance and Sexual Activity   Alcohol use: No   Drug use: No   Sexual activity: Not on file  Other Topics Concern   Not on file  Social History Narrative   Lives alone. Great granddaughter stays with her on weekends.    Divorced 76.       Still doing childcare   Retired from [employer]   Social Drivers of [employer]: Low Risk  (09/12/2023)   Overall Financial Resource Strain (CARDIA)    Difficulty of Paying Living Expenses: Not hard at all  Food Insecurity: No  Food Insecurity (09/12/2023)   Hunger Vital Sign    Worried About Running Out of Food in the Last Year: Never true    Ran Out of Food in the Last Year: Never true  Transportation Needs: No Transportation Needs (09/12/2023)   PRAPARE - Administrator, Civil Service (Medical): No    Lack of Transportation (Non-Medical): No  Physical Activity: Sufficiently Active (09/12/2023)   Exercise Vital Sign    Days of Exercise per Week: 5 days    Minutes of Exercise per Session: 60 min  Stress: No  Stress Concern Present (09/12/2023)   [employer] of Occupational Health - Occupational Stress Questionnaire    Feeling of Stress : Not at all  Social Connections: Moderately Integrated (09/12/2023)   Social Connection and Isolation Panel    Frequency of Communication with Friends and Family: More than three times a week    Frequency of Social Gatherings with Friends and Family: More than three times a week    Attends Religious Services: More than 4 times per year    Active Member of [employer] or Organizations: Yes    Attends Banker Meetings: 1 to 4 times per year    Marital Status: Divorced  Catering manager Violence: Not At Risk (09/12/2023)   Humiliation, Afraid, Rape, and Kick questionnaire    Fear of Current or Ex-Partner: No    Emotionally Abused: No    Physically Abused: No    Sexually Abused: No    FAMILY HISTORY: Family History  Problem Relation Age of Onset   Congestive Heart Failure Mother        72, only child   Other Father        was not in patient life    ALLERGIES:  is allergic to coumadin [warfarin sodium], methocarbamol , and norco [hydrocodone -acetaminophen ].  MEDICATIONS:  Current Outpatient Medications  Medication Sig Dispense Refill   Accu-Chek Softclix Lancets lancets      atorvastatin  (LIPITOR) 80 MG tablet Take 1 tablet (80 mg total) by mouth daily. 90 tablet 3   benzonatate  (TESSALON  PERLES) 100 MG capsule Take 1 capsule (100 mg total) by mouth 3 (three) times daily as needed. 20 capsule 0   Blood Glucose Monitoring Suppl DEVI 1 each by Does not apply route in the morning, at noon, and at bedtime. May substitute to any manufacturer covered by patient's insurance. 1 each 0   Calcium  Carbonate-Vitamin D (CALCIUM  PLUS VITAMIN D PO) Take 2 tablets by mouth daily.     cyanocobalamin  100 MCG tablet Take 100 mcg by mouth daily.     dapagliflozin  propanediol (FARXIGA ) 10 MG TABS tablet Take 1 tablet (10 mg total) by mouth daily before breakfast. 90  tablet 3   furosemide  (LASIX ) 20 MG tablet Take 1 tablet (20 mg total) by mouth as needed. 30 tablet 3   metoprolol  (TOPROL  XL) 200 MG 24 hr tablet Take 1 tablet (200 mg total) by mouth daily. 90 tablet 3   Multiple Vitamins-Minerals (MEGAVITE FRUITS & VEGGIES PO) Take by mouth. Balance nature fruits and veggies     omeprazole  (PRILOSEC) 20 MG capsule TAKE 1 CAPSULE(20 MG) BY MOUTH DAILY 90 capsule 1   sacubitril -valsartan  (ENTRESTO ) 97-103 MG Take 1 tablet by mouth 2 (two) times daily. 180 tablet 3   spironolactone  (ALDACTONE ) 25 MG tablet TAKE 1 TABLET(25 MG) BY MOUTH DAILY 90 tablet 1   No current facility-administered medications for this visit.    REVIEW OF SYSTEMS:   Constitutional: ( - )  fevers, ( - )  chills , ( - ) night sweats Eyes: ( - ) blurriness of vision, ( - ) double vision, ( - ) watery eyes Ears, nose, mouth, throat, and face: ( - ) mucositis, ( - ) sore throat Respiratory: ( - ) cough, ( - ) dyspnea, ( - ) wheezes Cardiovascular: ( - ) palpitation, ( - ) chest discomfort, ( - ) lower extremity swelling Gastrointestinal:  ( - ) nausea, ( - ) heartburn, ( - ) change in bowel habits Skin: ( - ) abnormal skin rashes Lymphatics: ( - ) new lymphadenopathy, ( - ) easy bruising Neurological: ( - ) numbness, ( - ) tingling, ( - ) new weaknesses Behavioral/Psych: ( - ) mood change, ( - ) new changes  All other systems were reviewed with the patient and are negative.  PHYSICAL EXAMINATION:  Vitals:   05/28/24 1125  BP: (!) 168/80  Pulse: 98  Resp: 16  Temp: (!) 97.5 F (36.4 C)  SpO2: 95%   Filed Weights   05/28/24 1125  Weight: 135 lb (61.2 kg)    GENERAL: well appearing elderly African-American female in NAD  SKIN: skin color, texture, turgor are normal, no rashes or significant lesions EYES: conjunctiva are pink and non-injected, sclera clear LUNGS: clear to auscultation and percussion with normal breathing effort HEART: regular rate & rhythm and no murmurs and  no lower extremity edema Musculoskeletal: no cyanosis of digits and no clubbing  PSYCH: alert & oriented x 3, fluent speech NEURO: no focal motor/sensory deficits  LABORATORY DATA:  I have reviewed the data as listed    Latest Ref Rng & Units 05/20/2024    2:50 PM 05/03/2024    9:17 AM 01/05/2024    9:13 AM  CBC  WBC 4.0 - 10.5 K/uL 4.5  5.8  3.7   Hemoglobin 12.0 - 15.0 g/dL 87.4  86.8  87.0   Hematocrit 36.0 - 46.0 % 37.1  40.2  40.0   Platelets 150 - 400 K/uL 228  188.0  184.0        Latest Ref Rng & Units 05/20/2024    2:50 PM 05/03/2024    9:17 AM 01/05/2024    9:13 AM  CMP  Glucose 70 - 99 mg/dL 892  94  90   BUN 8 - 23 mg/dL 30  28  39   Creatinine 0.44 - 1.00 mg/dL 8.43  8.51  8.08   Sodium 135 - 145 mmol/L 140  141  140   Potassium 3.5 - 5.1 mmol/L 4.5  3.8  5.0   Chloride 98 - 111 mmol/L 105  105  104   CO2 22 - 32 mmol/L 27  26  29    Calcium  8.9 - 10.3 mg/dL 9.1  9.8  9.7   Total Protein 6.5 - 8.1 g/dL 6.7   6.9   Total Bilirubin 0.0 - 1.2 mg/dL 0.4   0.7   Alkaline Phos 38 - 126 U/L 115   103   AST 15 - 41 U/L 18   20   ALT 0 - 44 U/L 10   11      ASSESSMENT & PLAN Nechama H Rekowski is a 83 y.o. female who returns for continued management of MGUS   #Monoclonal Gammopathy of Undetermined Significance --Labs from 05/20/2024 were reviewed with the patient.  CBC showed no cytopenias, creatinine was stable at 1.56, normal calcium  levels.  SPEP showed stable M protein measuring 0.3 g/dL.  Serum free  light chains are stable with kappa light chains measuring 51.9 mg/L, lambda light chains measuring 35.2 mg/L and ratio 1.47. --Most recent 24-hour UPEP from 12/14/2023 was unremarkable without any evidence of monoclonal protein.  Repeat annually, due around January 2026. --Most recent bone met survey from 12/14/2023 was negative for lytic lesions.  Repeat annually, due around January 2026. --No signs or symptoms of clinical transformation to active myeloma.  Continue with  surveillance. --RTC in 6 months in [facility]    No orders of the defined types were placed in this encounter.   All questions were answered. The patient knows to call the clinic with any problems, questions or concerns.   I have spent a total of 25 minutes minutes of face-to-face and non-face-to-face time, preparing to see the patient, obtaining and/or reviewing separately obtained history, performing a medically appropriate examination, counseling and educating the patient, ordering medications/tests/procedures, documenting clinical information in the electronic health record, independently interpreting results and communicating results to the patient, and care coordination.    Johnston Police PA-C Dept of Hematology and Oncology [facility] Phone: [phone]   05/28/2024 1:14 PM

## 2024-06-03 ENCOUNTER — Other Ambulatory Visit (HOSPITAL_COMMUNITY): Payer: Self-pay

## 2024-06-17 ENCOUNTER — Telehealth: Payer: Self-pay | Admitting: Internal Medicine

## 2024-06-17 MED ORDER — SACUBITRIL-VALSARTAN 97-103 MG PO TABS
1.0000 | ORAL_TABLET | Freq: Two times a day (BID) | ORAL | 3 refills | Status: AC
  Filled 2024-06-17: qty 180, 90d supply, fill #0
  Filled 2024-09-27 (×2): qty 180, 90d supply, fill #1

## 2024-06-17 NOTE — Telephone Encounter (Signed)
*  STAT* If patient is at the pharmacy, call can be transferred to refill team.   1. Which medications need to be refilled? (please list name of each medication and dose if known)   sacubitril -valsartan  (ENTRESTO ) 97-103 MG   2. Would you like to learn more about the convenience, safety, & potential cost savings by using the [facility]?   3. Are you open to using the [facility] (Type [facility]. ).  4. Which pharmacy/location (including street and city if local pharmacy) is medication to be sent to?  [place] [facility]   5. Do they need a 30 day or 90 day supply?   90 day  Patient stated she is completely out of this medication.  Patient wants a call back to confirm medication has been sent.

## 2024-06-18 ENCOUNTER — Other Ambulatory Visit (HOSPITAL_COMMUNITY): Payer: Self-pay

## 2024-06-18 ENCOUNTER — Other Ambulatory Visit: Payer: Self-pay

## 2024-06-18 NOTE — Telephone Encounter (Signed)
 Left VM that rx was sent last night

## 2024-06-26 ENCOUNTER — Other Ambulatory Visit: Payer: Self-pay | Admitting: Family Medicine

## 2024-06-28 ENCOUNTER — Encounter: Attending: Family Medicine | Admitting: Dietician

## 2024-06-28 ENCOUNTER — Encounter: Payer: Self-pay | Admitting: Dietician

## 2024-06-28 DIAGNOSIS — E119 Type 2 diabetes mellitus without complications: Secondary | ICD-10-CM | POA: Diagnosis not present

## 2024-06-28 NOTE — Progress Notes (Signed)
 2 Diabetes Self-Management Education  Visit Type: Follow-up  Appt. Start Time: 0910 Appt. End Time: 0945  07/07/2024  [name], identified by name and date of birth, is a 83 y.o. female with a diagnosis of Diabetes:  .   ASSESSMENT Patient is here today alone.  She was last seen by this RD on 02/22/2024.   Patient states that she feels well today. She states that her blood pressure is good. She is checking her blood glucose daily 120 yesterday fasting and 168 1 hour after breakfast She was having problems remembering how to use her lancet and showed her in the office today.   Referred diagnosis:  Type 2 diabetes and CKD   History includes:   type 2 diabetes, CKD, HTN, HLD. GERD. Medications include:  Metformin  (d/c'd), Farxiga , Entresto , Lasix , spironolactone , MVI, fruit and veggie gummie, vitamin B-12 Labs noted:  A1C 5.5% 05/03/2024 decreased from 6.4% 01/05/2024,  BUN 39 , Creatinine 1.91,  Potasium 4.7, GFR 23. 5%  01/05/2024 BUN 30, Creatinine 1.56, Potassium 4.5, GFR 33 on 05/20/2024, vitamin B12 1,295 06/20/2023    Weight hx: 136 lbs 06/28/2024 133 lbs 02/22/2023 and 11/17/2023 129 lbs 06/20/2023 135 lbs 03/09/2023   Patient lives alone.  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  Avoids added salt but diet is higher sodium due to foods eaten out and processed meat. Childcare is her exercise.  She has [employer] engineer but is too tired to go.    Diabetes Self-Management Education - 07/07/24 2128       Visit Information   Visit Type Follow-up      Psychosocial Assessment   Patient Belief/Attitude about Diabetes Motivated to manage diabetes    What is the hardest part about your diabetes right now, causing you the most concern, or is the most worrisome to you about your diabetes?   Making healty food and beverage choices    Self-care barriers None    Self-management support Doctor's office    Other persons  present Patient    Patient Concerns Healthy Lifestyle;Glycemic Control;Nutrition/Meal planning    Special Needs None    Preferred Learning Style No preference indicated    Learning Readiness Ready    How often do you need to have someone help you when you read instructions, pamphlets, or other written materials from your doctor or pharmacy? 1 - Never      Pre-Education Assessment   Patient understands the diabetes disease and treatment process. Demonstrates understanding / competency    Patient understands incorporating nutritional management into lifestyle. Needs Review    Patient undertands incorporating physical activity into lifestyle. Demonstrates understanding / competency    Patient understands using medications safely. Demonstrates understanding / competency    Patient understands monitoring blood glucose, interpreting and using results Demonstrates understanding / competency    Patient understands prevention, detection, and treatment of acute complications. Demonstrates understanding / competency    Patient understands prevention, detection, and treatment of chronic complications. Demonstrates understanding / competency    Patient understands how to develop strategies to address psychosocial issues. Demonstrates understanding / competency    Patient understands how to develop strategies to promote health/change behavior. Needs Review      Complications   Last HgB A1C per patient/outside source 5.5 %   05/03/2024   How often do you check your blood sugar? 1-2 times/day    Fasting Blood glucose range (mg/dL) 29-870  Dietary Intake   Breakfast Cereal (Honey Nut), 2% milk OR instant oatmeal (less sugar)    Lunch Chitlins and pigs feet, crackers    Snack (afternoon) Popcorn    Dinner [place] and tomato sandwich on [employer], 2% milk    Snack (evening) PB    Beverage(s) Water, water with cinnamon, 2% milk, no carb fruit punch, very rare regular gingerale      Activity / Exercise    Activity / Exercise Type Light (walking / raking leaves)    How many days per week do you exercise? 5    How many minutes per day do you exercise? 60    Total minutes per week of exercise 300      Patient Education   Previous Diabetes Education Yes   02/22/2024   Healthy Eating Meal options for control of blood glucose level and chronic complications.    Being Active Role of exercise on diabetes management, blood pressure control and cardiac health.    Medications Reviewed patients medication for diabetes, action, purpose, timing of dose and side effects.    Monitoring Taught/evaluated SMBG meter.    Diabetes Stress and Support Identified and addressed patients feelings and concerns about diabetes;Worked with patient to identify barriers to care and solutions      Individualized Goals (developed by patient)   Nutrition General guidelines for healthy choices and portions discussed    Physical Activity Exercise 3-5 times per week;60 minutes per day    Medications take my medication as prescribed    Monitoring  Test my blood glucose as discussed    Problem Solving Eating Pattern    Reducing Risk examine blood glucose patterns;do foot checks daily;treat hypoglycemia with 15 grams of carbs if blood glucose less than 70mg /dL      Patient Self-Evaluation of Goals - Patient rates self as meeting previously set goals (% of time)   Nutrition 50 - 75 % (half of the time)    Physical Activity >75% (most of the time)    Medications >75% (most of the time)    Monitoring >75% (most of the time)    Problem Solving and behavior change strategies  >75% (most of the time)    Reducing Risk (treating acute and chronic complications) >75% (most of the time)    Health Coping >75% (most of the time)      Post-Education Assessment   Patient understands the diabetes disease and treatment process. Demonstrates understanding / competency    Patient understands incorporating nutritional management into lifestyle.  Comprehends key points    Patient undertands incorporating physical activity into lifestyle. Demonstrates understanding / competency    Patient understands using medications safely. Demonstrates understanding / competency    Patient understands monitoring blood glucose, interpreting and using results Demonstrates understanding / competency    Patient understands prevention, detection, and treatment of acute complications. Demonstrates understanding / competency    Patient understands prevention, detection, and treatment of chronic complications. Demonstrates understanding / competency    Patient understands how to develop strategies to address psychosocial issues. Demonstrates understanding / competency    Patient understands how to develop strategies to promote health/change behavior. Comprehends key points      Outcomes   Expected Outcomes Demonstrated interest in learning. Expect positive outcomes    Future DMSE 3-4 months    Program Status Not Completed      Subsequent Visit   Since your last visit have you continued or begun to take your medications as prescribed? Yes  Since your last visit have you experienced any weight changes? Gain    Weight Gain (lbs) 3    Since your last visit, are you checking your blood glucose at least once a day? Yes          Individualized Plan for Diabetes Self-Management Training:   Learning Objective:  Patient will have a greater understanding of diabetes self-management. Patient education plan is to attend individual and/or group sessions per assessed needs and concerns.   Plan:   Patient Instructions  Avoid/limit processed meat such as sausage, bacon, hot dogs, ham. Continue to avoid added salt Avoid processed foods. It is best to cook your own. Portion size of condiments    Continue to stay active. Continue to eat a diet rich in non-starchy vegetables, fresh fruit, lean protein, whole grains.  Expected Outcomes:  Demonstrated interest  in learning. Expect positive outcomes  Education material provided:   If problems or questions, patient to contact team via:  Phone  Future DSME appointment: 3-4 months

## 2024-06-28 NOTE — Patient Instructions (Signed)
 Avoid/limit processed meat such as sausage, bacon, hot dogs, ham. Continue to avoid added salt Avoid processed foods. It is best to cook your own. Portion size of condiments   Continue to stay active. Continue to eat a diet rich in non-starchy vegetables, fresh fruit, lean protein, whole grains.

## 2024-07-04 ENCOUNTER — Telehealth: Payer: Self-pay | Admitting: Internal Medicine

## 2024-07-04 NOTE — Telephone Encounter (Signed)
*  STAT* If patient is at the pharmacy, call can be transferred to refill team.   1. Which medications need to be refilled? (please list name of each medication and dose if known) Farxiga    2. Would you like to learn more about the convenience, safety, & potential cost savings by using the [facility]?      3. Are you open to using the [facility] ([facility].   4. Which pharmacy/location (including street and city if local pharmacy) is medication to be sent to? [facility]   5. Do they need a 30 day or 90 day supply? 90 days and refills

## 2024-07-09 ENCOUNTER — Other Ambulatory Visit (HOSPITAL_COMMUNITY): Payer: Self-pay

## 2024-07-10 ENCOUNTER — Other Ambulatory Visit (HOSPITAL_COMMUNITY): Payer: Self-pay

## 2024-07-17 ENCOUNTER — Ambulatory Visit: Payer: Self-pay

## 2024-07-17 ENCOUNTER — Telehealth: Payer: Self-pay | Admitting: Family Medicine

## 2024-07-17 DIAGNOSIS — Z008 Encounter for other general examination: Secondary | ICD-10-CM | POA: Diagnosis not present

## 2024-07-17 DIAGNOSIS — R269 Unspecified abnormalities of gait and mobility: Secondary | ICD-10-CM | POA: Diagnosis not present

## 2024-07-17 NOTE — Telephone Encounter (Signed)
 FYI Only or Action Required?: Action required by provider: refused ED.  Patient was last seen in primary care on 05/03/2024 by Katrinka Garnette KIDD, MD.  Called Nurse Triage reporting Hypertension/right hand umbness/chest fullness.  Symptoms began pt unsure .  Interventions attempted: Nothing.  Symptoms are: stable.  Triage Disposition: Go to ED Now (Notify PCP)  Patient/caregiver understands and will follow disposition?: No wants PCP triage Advised risk of HTN, numbness and chest fullness being sx that require ED visit to evaluate. Advised pt that waiting is not a good  idea because her chest sx and neurological sx. Pt still wanted PCP to advise. Called C[facility] Idell) and advised of pt refusal.           Copied from CRM [id number]. Topic: Clinical - Red Word Triage >> Jul 17, 2024 12:06 PM Jasmin G wrote: Red Word that prompted transfer to Nurse Triage: Pt states that she recently got out of a doctor's visit and that her bp was checked 3 times marking at: 149/107, 160/78, 166/109. Her heart rate was also checked marking at: 57 and 55, due to these results, pt was recommended to call clinic immediately, pt also states that her fingers on right hand have been feeling stiff and numb. Reason for Disposition  [1] Systolic BP >= 160 OR Diastolic >= 100 AND [2] cardiac (e.g., breathing difficulty, chest pain) or neurologic symptoms (e.g., new-onset blurred or double vision, unsteady gait)  Answer Assessment - Initial Assessment Questions 1. BLOOD PRESSURE: What is your blood pressure? Did you take at least two measurements 5 minutes apart?     149/107, 160/78, 166/109 2. ONSET: When did you take your blood pressure?    NP at house 3. HOW: How did you take your blood pressure? (e.g., automatic home BP monitor, visiting nurse)     Recent doctor's office visit 4. HISTORY: Do you have a history of high blood pressure?     Yes  5. MEDICINES: Are you taking any medicines for blood  pressure? Have you missed any doses recently?     yes 6. OTHER SYMPTOMS: Do you have any symptoms? (e.g., blurred vision, chest pain, difficulty breathing, headache, weakness)     Right  hand numbness, right hip pain, h/o fullness chest  Answer Assessment - Initial Assessment Questions 1. LOCATION: Where does it hurt?       Mid chest  2. RADIATION: Does the pain go anywhere else? (e.g., into neck, jaw, arms, back)     Right arm  3. ONSET: When did the chest pain begin? (Minutes, hours or days)      Unsure  4. PATTERN: Does the pain come and go, or has it been constant since it started?  Does it get worse with exertion?      Comes and goes- not every day  5. DURATION: How long does it last (e.g., seconds, minutes, hours)     Less than an hour 6. SEVERITY: How bad is the pain?  (e.g., Scale 1-10; mild, moderate, or severe)     Not having at time of call  7. CARDIAC RISK FACTORS: Do you have any history of heart problems or risk factors for heart disease? (e.g., angina, prior heart attack; diabetes, high blood pressure, high cholesterol, smoker, or strong family history of heart disease)     HTN 8. PULMONARY RISK FACTORS: Do you have any history of lung disease?  (e.g., blood clots in lung, asthma, emphysema, birth control pills)     *No Answer*  9. CAUSE: What do you think is causing the chest pain?     HTN 10. OTHER SYMPTOMS: Do you have any other symptoms? (e.g., dizziness, nausea, vomiting, sweating, fever, difficulty breathing, cough)       Right hand numbness  Protocols used: Blood Pressure - High-A-AH, Chest Pain-A-AH

## 2024-07-17 NOTE — Telephone Encounter (Signed)
**Note De-identified  W[place]bright Obfuscation** Please advise 

## 2024-07-17 NOTE — Telephone Encounter (Signed)
 Pt was advised to go to the ED for her high blood pressure and numbness in hands and arm but she declined and wants Dr Katrinka to call her back. She will do what Dr Katrinka says rather than the triage nurse. Please advise.

## 2024-07-17 NOTE — Telephone Encounter (Signed)
 Left voicemail and discussed I cannot fully evaluate with the limited information available but with reported chest discomfort, elevated blood pressure, numbness in her hands I would seek care in the emergency department ASAP

## 2024-07-17 NOTE — Telephone Encounter (Signed)
 Doctor hunter aware. Duplicate encounter.

## 2024-07-18 ENCOUNTER — Other Ambulatory Visit: Payer: Self-pay

## 2024-07-18 ENCOUNTER — Emergency Department (HOSPITAL_COMMUNITY)
Admission: EM | Admit: 2024-07-18 | Discharge: 2024-07-19 | Disposition: A | Discharge status: Home / Self Care | Attending: Emergency Medicine | Admitting: Emergency Medicine

## 2024-07-18 ENCOUNTER — Emergency Department (HOSPITAL_COMMUNITY)

## 2024-07-18 ENCOUNTER — Encounter (HOSPITAL_COMMUNITY): Payer: Self-pay | Admitting: Emergency Medicine

## 2024-07-18 DIAGNOSIS — Z79899 Other long term (current) drug therapy: Secondary | ICD-10-CM | POA: Insufficient documentation

## 2024-07-18 DIAGNOSIS — N189 Chronic kidney disease, unspecified: Secondary | ICD-10-CM | POA: Insufficient documentation

## 2024-07-18 DIAGNOSIS — R0789 Other chest pain: Secondary | ICD-10-CM | POA: Diagnosis not present

## 2024-07-18 DIAGNOSIS — Z955 Presence of coronary angioplasty implant and graft: Secondary | ICD-10-CM | POA: Insufficient documentation

## 2024-07-18 DIAGNOSIS — R030 Elevated blood-pressure reading, without diagnosis of hypertension: Secondary | ICD-10-CM | POA: Diagnosis not present

## 2024-07-18 DIAGNOSIS — Z7984 Long term (current) use of oral hypoglycemic drugs: Secondary | ICD-10-CM | POA: Diagnosis not present

## 2024-07-18 DIAGNOSIS — I130 Hypertensive heart and chronic kidney disease with heart failure and stage 1 through stage 4 chronic kidney disease, or unspecified chronic kidney disease: Secondary | ICD-10-CM | POA: Diagnosis not present

## 2024-07-18 DIAGNOSIS — E119 Type 2 diabetes mellitus without complications: Secondary | ICD-10-CM | POA: Insufficient documentation

## 2024-07-18 DIAGNOSIS — I509 Heart failure, unspecified: Secondary | ICD-10-CM | POA: Insufficient documentation

## 2024-07-18 DIAGNOSIS — Z96653 Presence of artificial knee joint, bilateral: Secondary | ICD-10-CM | POA: Diagnosis not present

## 2024-07-18 DIAGNOSIS — I10 Essential (primary) hypertension: Secondary | ICD-10-CM | POA: Diagnosis not present

## 2024-07-18 DIAGNOSIS — J9811 Atelectasis: Secondary | ICD-10-CM | POA: Diagnosis not present

## 2024-07-18 LAB — BASIC METABOLIC PANEL WITH GFR
Anion gap: 11 (ref 5–15)
BUN: 27 mg/dL — ABNORMAL HIGH (ref 8–23)
CO2: 23 mmol/L (ref 22–32)
Calcium: 9.5 mg/dL (ref 8.9–10.3)
Chloride: 106 mmol/L (ref 98–111)
Creatinine, Ser: 1.64 mg/dL — ABNORMAL HIGH (ref 0.44–1.00)
GFR, Estimated: 31 mL/min — ABNORMAL LOW (ref >60)
Glucose, Bld: 94 mg/dL (ref 70–99)
Potassium: 4.6 mmol/L (ref 3.5–5.1)
Sodium: 140 mmol/L (ref 135–145)

## 2024-07-18 LAB — CBC
HCT: 42.2 % (ref 36.0–46.0)
Hemoglobin: 13.7 g/dL (ref 12.0–15.0)
MCH: 27.7 pg (ref 26.0–34.0)
MCHC: 32.5 g/dL (ref 30.0–36.0)
MCV: 85.3 fL (ref 80.0–100.0)
Platelets: 211 K/uL (ref 150–400)
RBC: 4.95 MIL/uL (ref 3.87–5.11)
RDW: 14.6 % (ref 11.5–15.5)
WBC: 5.1 K/uL (ref 4.0–10.5)
nRBC: 0 % (ref 0.0–0.2)

## 2024-07-18 LAB — TROPONIN I (HIGH SENSITIVITY)
Troponin I (High Sensitivity): 9 ng/L (ref <18)
Troponin I (High Sensitivity): 12 ng/L (ref <18)

## 2024-07-18 NOTE — ED Triage Notes (Signed)
 Pt sent from PCP for HTN. 177/103 at MD office. Has been compliant with BP meds.

## 2024-07-18 NOTE — Telephone Encounter (Signed)
 FYI - Pt called and E2C2 read Dr San notes, pt states she will be going to the ED asap.

## 2024-07-18 NOTE — ED Provider Triage Note (Signed)
 Emergency Medicine Provider Triage Evaluation Note  Maria [name] , a 83 y.o. female  was evaluated in triage.  Pt complains of elevated blood pressure readings, overall asymptomatic, but has CKD, diabetes, CHF, sent by PCP to check on kidney function.  She does endorse some chest tightness but denies pain.  Denies shortness of breath, nausea, vomiting.  Denies fever, chills.  Has been taking her blood pressure medication as prescribed without any missed doses..  Review of Systems  Positive: Chest tightness, elevated BP Negative: Shob, chest pain  Physical Exam  BP (!) 178/103 (BP Location: Right Arm)   Pulse 93   Temp 98.1 F (36.7 C) (Oral)   Resp 18   SpO2 97%  Gen:   Awake, no distress   Resp:  Normal effort  MSK:   Moves extremities without difficulty  Other:    Medical Decision Making  Medically screening exam initiated at 2:12 PM.  Appropriate orders placed.  Maria [name] was informed that the remainder of the evaluation will be completed by another provider, this initial triage assessment does not replace that evaluation, and the importance of remaining in the ED until their evaluation is complete.  Workup initiated in triage    Maria [name], [place] 07/18/24 1413

## 2024-07-19 LAB — CBG MONITORING, ED: Glucose-Capillary: 106 mg/dL — ABNORMAL HIGH (ref 70–99)

## 2024-07-19 NOTE — ED Provider Notes (Signed)
 [place] EMERGENCY DEPARTMENT AT [facility] Provider Note   CSN: [id number] Arrival date & time: 07/18/24  1242     Patient presents with: Hypertension   Maria [name] is a 83 y.o. female with history of CHF, CKD, hypertension, hyperlipidemia, diabetes presents with complaints of elevated blood pressure.  Patient reports her blood pressure was 177/103 at home.  She is without any complaints.  No headache, vision changes, extremity weakness or numbness.  She denies any chest pain or shortness of breath although she does state that yesterday she had a fullness in her chest.  Symptoms were intermittent without any obvious exertional aggravation.  Patient reports compliance with her blood pressure medication.  {Add pertinent medical, surgical, social history, OB history to HPI:32947}  Hypertension Pertinent negatives include no headaches.   Past Medical History:  Diagnosis Date   Arthritis    Bronchitis    hx of   Chronic kidney disease    Diabetes mellitus without complication (HCC)    GERD (gastroesophageal reflux disease)    History of bleeding ulcers    History of gastrointestinal hemorrhage    now off aspirin    Hyperlipidemia    Hypertension    Osteoarthritis of right knee 06/03/2012   Past Surgical History:  Procedure Laterality Date   ABDOMINAL HYSTERECTOMY     nonmalignant reasons   APPENDECTOMY     RIGHT/LEFT HEART CATH AND CORONARY ANGIOGRAPHY N/A 02/25/2022   Procedure: RIGHT/LEFT HEART CATH AND CORONARY ANGIOGRAPHY;  Surgeon: Verlin Lonni BIRCH, MD;  Location: [facility] INVASIVE CV LAB;  Service: Cardiovascular;  Laterality: N/A;   TOTAL KNEE ARTHROPLASTY  05/30/2012   Procedure: TOTAL KNEE ARTHROPLASTY;  Surgeon: Toribio JULIANNA Chancy, MD;  Location: [facility] OR;  Service: Orthopedics;  Laterality: Right;  DR MURPHY WANTS 90 MINUTES FOR THIS CASE. 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 Authorizing Provider  Accu-Chek Softclix Lancets lancets     [provider]  atorvastatin  (LIPITOR) 80 MG tablet Take 1 tablet (80 mg total) by mouth daily. 04/01/24   Santo Stanly LABOR, MD  benzonatate  (TESSALON  PERLES) 100 MG capsule Take 1 capsule (100 mg total) by mouth 3 (three) times daily as needed. 01/23/24   Wendolyn Jenkins Jansky, MD  Blood Glucose Monitoring Suppl DEVI 1 each by Does not apply route in the morning, at noon, and at bedtime. May substitute to any manufacturer covered by patient's insurance. 06/20/23   Katrinka Garnette KIDD, MD  Calcium  Carbonate-Vitamin D (CALCIUM  PLUS VITAMIN D PO) Take 2 tablets by mouth daily.    [provider]  cyanocobalamin  100 MCG tablet Take 100 mcg by mouth daily.    [provider]  dapagliflozin  propanediol (FARXIGA ) 10 MG TABS tablet Take 1 tablet (10 mg total) by mouth daily before breakfast. 04/05/24   Chandrasekhar, Mahesh A, MD  furosemide  (LASIX ) 20 MG tablet TAKE 1 TABLET BY MOUTH AS NEEDED AS DIRECTED 06/26/24   Katrinka Garnette KIDD, MD  metoprolol  (TOPROL  XL) 200 MG 24 hr tablet Take 1 tablet (200 mg total) by mouth daily. 05/12/22   Santo Stanly LABOR, MD  Multiple Vitamins-Minerals (MEGAVITE FRUITS & VEGGIES PO) Take by mouth. Balance nature fruits and veggies    [provider]  omeprazole  (PRILOSEC)  20 MG capsule TAKE 1 CAPSULE(20 MG) BY MOUTH DAILY 03/14/24   Katrinka Garnette KIDD, MD  sacubitril -valsartan  (ENTRESTO ) 97-103 MG Take 1 tablet by mouth 2 (two) times daily. 06/17/24   Chandrasekhar, Stanly LABOR, MD  spironolactone  (ALDACTONE ) 25 MG tablet TAKE 1 TABLET(25 MG) BY MOUTH DAILY 06/06/23   Santo Stanly LABOR, MD    Allergies: Coumadin [warfarin sodium], Methocarbamol , and Norco [hydrocodone -acetaminophen ]    Review of Systems  Neurological:   Negative for headaches.    Updated Vital Signs BP (!) 174/78 (BP Location: Right Arm)   Pulse 94   Temp 98 F (36.7 C)   Resp 16   SpO2 100%   Physical Exam Vitals and nursing note reviewed.  Constitutional:      General: She is not in acute distress.    Appearance: She is well-developed.  HENT:     Head: Normocephalic and atraumatic.  Eyes:     Conjunctiva/sclera: Conjunctivae normal.  Cardiovascular:     Rate and Rhythm: Normal rate and regular rhythm.     Heart sounds: No murmur heard. Pulmonary:     Effort: Pulmonary effort is normal. No respiratory distress.     Breath sounds: Normal breath sounds.  Abdominal:     Palpations: Abdomen is soft.     Tenderness: There is no abdominal tenderness.  Musculoskeletal:        General: No swelling.     Cervical back: Neck supple.  Skin:    General: Skin is warm and dry.     Capillary Refill: Capillary refill takes less than 2 seconds.  Neurological:     Mental Status: She is alert.  Psychiatric:        Mood and Affect: Mood normal.     (all labs ordered are listed, but only abnormal results are displayed) Labs Reviewed  BASIC METABOLIC PANEL WITH GFR - Abnormal; Notable for the following components:      Result Value   BUN 27 (*)    Creatinine, Ser 1.64 (*)    GFR, Estimated 31 (*)    All other components within normal limits  CBG MONITORING, ED - Abnormal; Notable for the following components:   Glucose-Capillary 106 (*)    All other components within normal limits  CBC  TROPONIN I (HIGH SENSITIVITY)  TROPONIN I (HIGH SENSITIVITY)    EKG: None  Radiology: DG Chest 2 View Result Date: 07/18/2024 CLINICAL DATA:  Chest tightness. EXAM: CHEST - 2 VIEW COMPARISON:  February 08, 2022 FINDINGS: The heart size and mediastinal contours are within normal limits. There is marked severity calcification of the thoracic aorta. Very mild atelectasis is suspected within the left lung base. No acute infiltrate, pleural effusion  or pneumothorax is identified. Multilevel degenerative changes are present throughout the thoracic spine. IMPRESSION: Very mild left basilar atelectasis. Electronically Signed   By: Suzen Dials M.D.   On: 07/18/2024 16:16    {Document cardiac monitor, telemetry assessment procedure when appropriate:32947} Procedures   Medications Ordered in the ED - No data to display  Clinical Course as of 07/19/24 0226  Fri Jul 19, 2024  0153 Patient with multiple cardiac risk factors evaluated in the ED for elevated blood pressure at home.  Reassuringly patient is asymptomatic and her exam is without any notable findings.  Routine labs ordered in triage. [JT]  0154 Troponin I (High Sensitivity) Troponin without elevation [JT]  0154 CBC Unremarkable [JT]  0154 Basic metabolic panel(!) Consistent with CKD, no electrolyte derangement [JT]  0155  DG Chest 2 View No acute findings [JT]  0215 ED EKG EKG sinus rhythm without ischemic changes. [JT]  0216 Overall presentation most consistent with asymptomatic hypertension.  Workup ordered in triage is without any significant abnormality.  Patient currently with systolics in the 170s.  Do not feel that any urgent blood pressure control is indicated here in the ED.  Will discharge home with PCP follow-up and strict return precautions. [JT]    Clinical Course User Index [JT] Donnajean Lynwood DEL, PA-C   {Click here for ABCD2, HEART and other calculators REFRESH Note before signing:1}                              Medical Decision Making This patient presents to the ED with chief complaint(s) of hypertension.  The complaint involves an extensive differential diagnosis and also carries with it a high risk of complications and morbidity.   Pertinent past medical history as listed in HPI  The differential diagnosis includes  Considered ACS, however patient is without any chest pain or shortness of breath.  Delta troponin without elevation.  EKG without ischemic  changes she has no headache, vision changes or any neurodeficits to suggest CVA or TIA.   Additional history obtained: Records reviewed Care Everywhere/External Records  Disposition:   Patient will be discharged home. The patient has been appropriately medically screened and/or stabilized in the ED. I have low suspicion for any other emergent medical condition which would require further screening, evaluation or treatment in the ED or require inpatient management. At time of discharge the patient is hemodynamically stable and in no acute distress. I have discussed work-up results and diagnosis with patient and answered all questions. Patient is agreeable with discharge plan. We discussed strict return precautions for returning to the emergency department and they verbalized understanding.     Social Determinants of Health:   none  This note was dictated with voice recognition software.  Despite best efforts at proofreading, errors may have occurred which can change the documentation meaning.     {Document critical care time when appropriate  Document review of labs and clinical decision tools ie CHADS2VASC2, etc  Document your independent review of radiology images and any outside records  Document your discussion with family members, caretakers and with consultants  Document social determinants of health affecting pt's care  Document your decision making why or why not admission, treatments were needed:32947:::1}   Final diagnoses:  Elevated blood pressure reading    ED Discharge Orders     None

## 2024-07-19 NOTE — Discharge Instructions (Signed)
 You were evaluated in the emergency room for elevated blood pressure.  Your lab work and imaging did not show any significant abnormality.  Please take your blood pressure medication as prescribed and follow-up with your primary care doctor for further evaluation.

## 2024-07-30 ENCOUNTER — Other Ambulatory Visit: Payer: Self-pay | Admitting: Family Medicine

## 2024-07-30 MED ORDER — SPIRONOLACTONE 25 MG PO TABS: 25.0000 mg | ORAL_TABLET | Freq: Every day | ORAL | 1 refills | Status: DC

## 2024-07-30 NOTE — Telephone Encounter (Signed)
 Copied from CRM [id number]. Topic: Clinical - Medication Refill >> Jul 30, 2024  4:52 PM Tysheama G wrote: Medication: spironolactone  (ALDACTONE ) 25 MG tablet  Has the patient contacted their pharmacy? No (Agent: If no, request that the patient contact the pharmacy for the refill. If patient does not wish to contact the pharmacy document the reason why and proceed with request.) (Agent: If yes, when and what did the pharmacy advise?)  This is the patient's preferred pharmacy:  [facility] [phone] - [place], [place] - [address] AT [facility] [address] [place] [address] Phone: [phone] Fax: [phone]   Is this the correct pharmacy for this prescription? Yes If no, delete pharmacy and type the correct one.   Has the prescription been filled recently? No  Is the patient out of the medication? Yes  Has the patient been seen for an appointment in the last year OR does the patient have an upcoming appointment? Yes  Can we respond through MyChart? No  Agent: Please be advised that Rx refills may take up to 3 business days. We ask that you follow-up with your pharmacy.

## 2024-08-03 ENCOUNTER — Other Ambulatory Visit: Payer: Self-pay | Admitting: Family Medicine

## 2024-08-09 ENCOUNTER — Encounter: Payer: Self-pay | Admitting: Family Medicine

## 2024-08-09 ENCOUNTER — Ambulatory Visit: Payer: Self-pay | Admitting: Family Medicine

## 2024-08-09 ENCOUNTER — Ambulatory Visit: Admitting: Family Medicine

## 2024-08-09 VITALS — BP 128/72 | HR 88 | Temp 97.5°F | Ht 61.0 in | Wt 132.0 lb

## 2024-08-09 DIAGNOSIS — N1832 Chronic kidney disease, stage 3b: Secondary | ICD-10-CM | POA: Diagnosis not present

## 2024-08-09 DIAGNOSIS — I5022 Chronic systolic (congestive) heart failure: Secondary | ICD-10-CM | POA: Diagnosis not present

## 2024-08-09 DIAGNOSIS — E1122 Type 2 diabetes mellitus with diabetic chronic kidney disease: Secondary | ICD-10-CM

## 2024-08-09 LAB — COMPREHENSIVE METABOLIC PANEL WITH GFR
ALT: 14 U/L (ref 0–35)
AST: 24 U/L (ref 0–37)
Albumin: 4 g/dL (ref 3.5–5.2)
Alkaline Phosphatase: 110 U/L (ref 39–117)
BUN: 43 mg/dL — ABNORMAL HIGH (ref 6–23)
CO2: 26 meq/L (ref 19–32)
Calcium: 10.1 mg/dL (ref 8.4–10.5)
Chloride: 104 meq/L (ref 96–112)
Creatinine, Ser: 1.64 mg/dL — ABNORMAL HIGH (ref 0.40–1.20)
GFR: 28.57 mL/min — ABNORMAL LOW (ref >60.00)
Glucose, Bld: 93 mg/dL (ref 70–99)
Potassium: 4.8 meq/L (ref 3.5–5.1)
Sodium: 139 meq/L (ref 135–145)
Total Bilirubin: 0.6 mg/dL (ref 0.2–1.2)
Total Protein: 7.3 g/dL (ref 6.0–8.3)

## 2024-08-09 LAB — HEMOGLOBIN A1C: Hgb A1c MFr Bld: 6.7 % — ABNORMAL HIGH (ref 4.6–6.5)

## 2024-08-09 LAB — CBC WITH DIFFERENTIAL/PLATELET
Basophils Absolute: 0 K/uL (ref 0.0–0.1)
Basophils Relative: 0.9 % (ref 0.0–3.0)
Eosinophils Absolute: 0.1 K/uL (ref 0.0–0.7)
Eosinophils Relative: 2.5 % (ref 0.0–5.0)
HCT: 41.2 % (ref 36.0–46.0)
Hemoglobin: 13.2 g/dL (ref 12.0–15.0)
Lymphocytes Relative: 25 % (ref 12.0–46.0)
Lymphs Abs: 1.1 K/uL (ref 0.7–4.0)
MCHC: 32 g/dL (ref 30.0–36.0)
MCV: 85.4 fl (ref 78.0–100.0)
Monocytes Absolute: 0.3 K/uL (ref 0.1–1.0)
Monocytes Relative: 6.8 % (ref 3.0–12.0)
Neutro Abs: 3 K/uL (ref 1.4–7.7)
Neutrophils Relative %: 64.8 % (ref 43.0–77.0)
Platelets: 215 K/uL (ref 150.0–400.0)
RBC: 4.83 Mil/uL (ref 3.87–5.11)
RDW: 15.4 % (ref 11.5–15.5)
WBC: 4.6 K/uL (ref 4.0–10.5)

## 2024-08-09 NOTE — Patient Instructions (Addendum)
 blood pressure has been elevated we suspect due to stress with great grandchild situation- we discussed working on ways of healthily detaching from this situation. She has not increased salt intake or anything else to make this  blood pressure go up and has maintained medications and blood pressure was 118 on last visit and controlled today in office -she's going to be intentional about doing some things for herself and I think that's a great idea  - if blood pressure remains elevated (asked her to check when stress levels low only) then id like to side effects ehre back but I hesitate to increase medications if she's running 110s and 120s in office -we advised sitting at table with arm propped up and feet flat. She has been checking home blood pressure at bedside holding her arm up which is not ideal.   Flu shot high dose recommended- we don't have today but should have next week or can get at pharmacy  Please stop by lab before you go If you have mychart- we will send your results within 3 business days of us  receiving them.  If you do not have mychart- we will call you about results within 5 business days of us  receiving them.  *please also note that you will see labs on mychart as soon as they post. I will later go in and write notes on them- will say notes from Dr. Katrinka   Recommended follow up: Return in about 14 weeks (around 11/15/2024) for followup or sooner if needed.Schedule b4 you leave.

## 2024-08-09 NOTE — Progress Notes (Signed)
 Phone [phone] In person visit   Subjective:   Maria [name] is a 83 y.o. 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    Priority: High   History of gastrointestinal hemorrhage     Priority: High   Fatty liver 01/07/2022    Priority: Medium    Microalbuminuria 12/17/2020    Priority: Medium    Aortic atherosclerosis (HCC) 06/18/2020    Priority: Medium    CKD (chronic kidney disease), stage IV (HCC) 10/04/2019    Priority: Medium    GERD (gastroesophageal reflux disease) 05/27/2014    Priority: Medium    Hyperlipidemia LDL goal <100 08/21/2008    Priority: Medium    Essential hypertension 08/21/2008    Priority: Medium    PVC (premature ventricular contraction) 02/14/2022    Priority: Low   Arthritis of right hip 10/10/2019    Priority: Low   Bursitis of right hip 10/10/2019    Priority: Low   Allergic rhinitis 10/03/2019    Priority: Low   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 90 tablet 3   Blood Glucose Monitoring Suppl DEVI 1 each by Does not apply route in the morning, at noon, and at bedtime. May substitute to any manufacturer covered by patient's insurance.  1 each 0   Calcium  Carbonate-Vitamin D (CALCIUM  PLUS VITAMIN D PO) Take 2 tablets by mouth daily.     cyanocobalamin  100 MCG tablet Take 100 mcg by mouth daily.     dapagliflozin  propanediol (FARXIGA ) 10 MG TABS tablet Take 1 tablet (10 mg total) by mouth daily before breakfast. 90 tablet 3   furosemide  (LASIX ) 20 MG tablet TAKE 1 TABLET BY MOUTH AS NEEDED AS DIRECTED 30 tablet 3   metoprolol  (TOPROL  XL) 200 MG 24 hr tablet Take 1 tablet (200 mg total) by mouth daily. 90 tablet 3   Multiple Vitamins-Minerals (MEGAVITE FRUITS & VEGGIES PO) Take by mouth. Balance nature fruits and veggies     omeprazole  (PRILOSEC) 20 MG capsule TAKE 1 CAPSULE(20 MG) BY MOUTH DAILY 90 capsule 1   sacubitril -valsartan  (ENTRESTO ) 97-103 MG Take 1 tablet by mouth 2 (two) times daily. 180 tablet 3   spironolactone  (ALDACTONE ) 25 MG tablet Take 1 tablet (25 mg total) by mouth daily. 90 tablet 1   No current facility-administered medications for this visit.     Objective:  BP 128/72 (BP Location: Left Arm, Patient Position: Sitting, Cuff Size: Normal)   Pulse 88   Temp (!) 97.5 F (36.4 C) (Temporal)   Ht 5' 1 (1.549 m)   Wt 132 lb (59.9 kg)   SpO2 95%   BMI  24.94 kg/m  Gen: NAD, resting comfortably CV: RRR no murmurs rubs or gallops Lungs: CTAB no crackles, wheeze, rhonchi Ext: no edema (noted with CHF history) Skin: warm, dry  Diabetic foot exam was performed with the following findings:   Normal sensation of 10g monofilament Intact posterior tibialis and dorsalis pedis pulses Bunion on left foot which causes some hammering on the 2nd toe        Assessment and Plan   # 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 on 12/14/2023 reassuring - had visit 05/28/24 and overall stable with 6 month follow up planned  # Diabetes- new diagnosis 12/19/22 with 2nd a1c of 6.5 or above S: Medication:  on farxiga  for CHF at 10 mg -previously Metformin  stopped due to GFR under 30   -patient also had elevated  microalbumin to creatinine ratio but much improved from 514 to 38 with entresto  and Farxiga  Lab Results  Component Value Date   HGBA1C 5.5 05/03/2024   HGBA1C 6.4 01/05/2024   HGBA1C 6.3 06/20/2023  A/P: diabetes well controlled continue current medications likely- update a1c today  # CKD stage IV?-refer to nephrology on 06/20/2023  but levels improved back to Stage III again- sees Dr. Macel With microalbuminuria- on entresto  plus farxiga  - #s improved as above. Check renal function today- just above 30 on recent trip to Emergency Department with blood pressure elevation  # CHF- Dr. Santo cardiology- diagnosed 2023 S:Medication: farxiga  10 mg, lasix  20 mg daily prn, entresto  97-103 mg twice daily , spironolactone  25 mg, metoprolol  200 mg XR - has had  cath 02/25/22 with no evidence of CAD- remain off aspirin  A/P: doing well and not needing lasix  almost at all with Farxiga , spironolactone  and entresto    #Essential hypertension S: Compliant withlasix 20 mg daily, metoprolol  200 mg XR, entresto  97-103 mg twice daily , spironolactone  25 mg -off amlodipine  due to CHF, also  Prior was on atenolol -chlorthalidone  50-25 mg but had worsening of renal function and issues maintaining potassium. - a lot of stress related to great grand daughter's situation- a 50 year old that is not well behaved BP Readings from Last 3 Encounters:  08/09/24 128/72  07/19/24 (!) 171/82  05/28/24 (!) 168/80  A/P: blood pressure has been elevated we suspect due to stress with great grandchild situation- we discussed working on ways of healthily detaching from this situation. She has not increased salt intake or anything else to make this  blood pressure go up and has maintained medications and blood pressure was 118 on last visit and controlled today in office -she's going to be intentional about doing some things for herself and I think that's a great idea  - if blood pressure remains elevated (asked her to check  when stress levels low only) then id like to side effects ehre back but I hesitate to increase medications if she's running 110s and 120s in office -we advised sitting at table with arm propped up and feet flat. She has been checking home blood pressure at bedside holding her arm up which is not ideal.   #Hyperlipidemia aortic atherosclerosis S: Compliant with atorvastatin  80 mg - increased from 40 mg with LDL over 100 Lab Results  Component Value Date   CHOL 143 01/05/2024   HDL 54.70 01/05/2024   LDLCALC 80 01/05/2024   LDLDIRECT 71.0 06/20/2023   TRIG 40.0 01/05/2024   CHOLHDL 3 01/05/2024  A/P:  lipids close to ideal goal . Continue current medications - recheck next year  Recommended  follow up: Return in about 14 weeks (around 11/15/2024) for followup or sooner if needed.Schedule b4 you leave. Future Appointments  Date Time Provider Department Center  09/17/2024  8:40 AM [facility] ANNUAL WELLNESS VISIT 1 [facility]  10/17/2024  9:00 AM Knox Leita CROME, RD [facility]  11/12/2024  8:15 AM [facility] LAB [facility] None  11/26/2024  8:40 AM Thayil, Johnston DASEN, PA-C [facility] None    Lab/Order associations:   ICD-10-CM   1. Type 2 diabetes mellitus with stage 3 chronic kidney disease, without long-term current use of insulin, unspecified whether stage 3a or 3b CKD (HCC)  E11.22 Comprehensive metabolic panel with GFR   N18.30 CBC with Differential/Platelet    Hemoglobin A1c    2. CKD stage 3b, GFR 30-44 ml/min (HCC)  N18.32     3. Chronic systolic CHF (congestive heart failure), NYHA class 3 (HCC)  I50.22      Return precautions advised.  Garnette Lukes, MD

## 2024-09-17 ENCOUNTER — Ambulatory Visit: Payer: No Typology Code available for payment source

## 2024-09-17 VITALS — BP 120/80 | HR 99 | Temp 98.2°F | Ht 61.0 in | Wt 131.2 lb

## 2024-09-17 DIAGNOSIS — Z0000 Encounter for general adult medical examination without abnormal findings: Secondary | ICD-10-CM

## 2024-09-17 NOTE — Patient Instructions (Signed)
 [name],  Thank you for taking the time for your Medicare Wellness Visit. I appreciate your continued commitment to your health goals. Please review the care plan we discussed, and feel free to reach out if I can assist you further.  Medicare recommends these wellness visits once per year to help you and your care team stay ahead of potential health issues. These visits are designed to focus on prevention, allowing your provider to concentrate on managing your acute and chronic conditions during your regular appointments.  Please note that Annual Wellness Visits do not include a physical exam. Some assessments may be limited, especially if the visit was conducted virtually. If needed, we may recommend a separate in-person follow-up with your provider.  Ongoing Care Seeing your primary care provider every 3 to 6 months helps us  monitor your health and provide consistent, personalized care.   Referrals If a referral was made during today's visit and you haven't received any updates within two weeks, please contact the referred provider directly to check on the status.  Recommended Screenings:  Health Maintenance  Topic Date Due   COVID-19 Vaccine (6 - 2025-26 season) 08/05/2024   Medicare Annual Wellness Visit  09/11/2024   Eye exam for diabetics  08/30/2024   Zoster (Shingles) Vaccine (1 of 2) 11/08/2024*   Flu Shot  03/04/2025*   Breast Cancer Screening  11/21/2024   Hemoglobin A1C  02/06/2025   Yearly kidney health urinalysis for diabetes  05/03/2025   Yearly kidney function blood test for diabetes  08/09/2025   Complete foot exam   08/09/2025   Pneumococcal Vaccine for age over 80  Completed   DEXA scan (bone density measurement)  Completed   Meningitis B Vaccine  Aged Out   DTaP/Tdap/Td vaccine  Discontinued  *Topic was postponed. The date shown is not the original due date.       07/18/2024    2:16 PM  Advanced Directives  Does Patient Have a Medical Advance Directive?  No   Advance Care Planning is important because it: Ensures you receive medical care that aligns with your values, goals, and preferences. Provides guidance to your family and loved ones, reducing the emotional burden of decision-making during critical moments.  Vision: Annual vision screenings are recommended for early detection of glaucoma, cataracts, and diabetic retinopathy. These exams can also reveal signs of chronic conditions such as diabetes and high blood pressure.  Dental: Annual dental screenings help detect early signs of oral cancer, gum disease, and other conditions linked to overall health, including heart disease and diabetes.  Please see the attached documents for additional preventive care recommendations.

## 2024-09-17 NOTE — Progress Notes (Signed)
 Subjective:   JONATHAN KIRKENDOLL is a 83 y.o. who presents for a Medicare Wellness preventive visit.  As a reminder, Annual Wellness Visits don't include a physical exam, and some assessments may be limited, especially if this visit is performed virtually. We may recommend an in-person follow-up visit with your provider if needed.  Visit Complete: In person    Persons Participating in Visit: Patient.  AWV Questionnaire: No: Patient Medicare AWV questionnaire was not completed prior to this visit.  Cardiac Risk Factors include: advanced age (>57men, >23 women);dyslipidemia;diabetes mellitus;hypertension     Objective:    Today's Vitals   09/17/24 0841  BP: 120/80  Pulse: 99  Temp: 98.2 F (36.8 C)  SpO2: 98%  Weight: 131 lb 3.2 oz (59.5 kg)  Height: 5' 1 (1.549 m)   Body mass index is 24.79 kg/m.     09/17/2024    9:08 AM 07/18/2024    2:16 PM 09/12/2023    8:14 AM 01/30/2023    8:16 AM 09/02/2022    8:48 AM 02/25/2022    7:58 AM 08/23/2021    8:14 AM  Advanced Directives  Does Patient Have a Medical Advance Directive? No No No No Yes No Yes  Type of Agricultural consultant;Living will    Does patient want to make changes to medical advance directive?       Yes (MAU/Ambulatory/Procedural Areas - Information given)  Copy of Healthcare Power of Attorney in Chart?     No - copy requested    Would patient like information on creating a medical advance directive? No - Patient declined  No - Patient declined Yes (MAU/Ambulatory/Procedural Areas - Information given)  No - Patient declined     Current Medications (verified) Outpatient Encounter Medications as of 09/17/2024  Medication Sig   ACCU-CHEK GUIDE TEST test strip TEST MORNING, NOON AND BEDTIME   Accu-Chek Softclix Lancets lancets    atorvastatin  (LIPITOR) 80 MG tablet Take 1 tablet (80 mg total) by mouth daily.   Blood Glucose Monitoring Suppl DEVI 1 each by Does not apply route in the  morning, at noon, and at bedtime. May substitute to any manufacturer covered by patient's insurance.   Calcium  Carbonate-Vitamin D (CALCIUM  PLUS VITAMIN D PO) Take 2 tablets by mouth daily.   cyanocobalamin  100 MCG tablet Take 500 mcg by mouth daily.   dapagliflozin  propanediol (FARXIGA ) 10 MG TABS tablet Take 1 tablet (10 mg total) by mouth daily before breakfast.   furosemide  (LASIX ) 20 MG tablet TAKE 1 TABLET BY MOUTH AS NEEDED AS DIRECTED   Multiple Vitamins-Minerals (MEGAVITE FRUITS & VEGGIES PO) Take by mouth. Balance nature fruits and veggies   omeprazole  (PRILOSEC) 20 MG capsule TAKE 1 CAPSULE(20 MG) BY MOUTH DAILY   sacubitril -valsartan  (ENTRESTO ) 97-103 MG Take 1 tablet by mouth 2 (two) times daily.   spironolactone  (ALDACTONE ) 25 MG tablet Take 1 tablet (25 mg total) by mouth daily.   [DISCONTINUED] metoprolol  (TOPROL  XL) 200 MG 24 hr tablet Take 1 tablet (200 mg total) by mouth daily.   No facility-administered encounter medications on file as of 09/17/2024.    Allergies (verified) Coumadin [warfarin sodium], Methocarbamol , and Norco [hydrocodone -acetaminophen ]   History: Past Medical History:  Diagnosis Date   Arthritis    Bronchitis    hx of   Chronic kidney disease    Diabetes mellitus without complication (HCC)    GERD (gastroesophageal reflux disease)    History of bleeding ulcers  History of gastrointestinal hemorrhage    now off aspirin    Hyperlipidemia    Hypertension    Osteoarthritis of right knee 06/03/2012   Past Surgical History:  Procedure Laterality Date   ABDOMINAL HYSTERECTOMY     nonmalignant reasons   APPENDECTOMY     RIGHT/LEFT HEART CATH AND CORONARY ANGIOGRAPHY N/A 02/25/2022   Procedure: RIGHT/LEFT HEART CATH AND CORONARY ANGIOGRAPHY;  Surgeon: Verlin Lonni BIRCH, MD;  Location: [facility];  Service: Cardiovascular;  Laterality: N/A;   TOTAL KNEE ARTHROPLASTY  05/30/2012   Procedure: TOTAL KNEE ARTHROPLASTY;  Surgeon: Toribio JULIANNA Chancy, MD;  Location: [facility];  Service: Orthopedics;  Laterality: Right;  DR MURPHY WANTS 90 MINUTES FOR THIS CASE. 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   Procedure: LEFT TOTAL KNEE ARTHROPLASTY;  Surgeon: Toribio JULIANNA Chancy, MD;  Location: [facility] OR;  Service: Orthopedics;  Laterality: Left;   Family History  Problem Relation Age of Onset   Congestive Heart Failure Mother        37, only child   Other Father        was not in patient life   Social History   Socioeconomic History   Marital status: Divorced    Spouse name: Not on file   Number of children: Not on file   Years of education: Not on file   Highest education level: Not on file  Occupational History   Occupation: day care   Tobacco Use   Smoking status: Never   Smokeless tobacco: Never  Substance and Sexual Activity   Alcohol use: No   Drug use: No   Sexual activity: Not on file  Other Topics Concern   Not on file  Social History Narrative   Lives alone. Great granddaughter stays with her on weekends.    Divorced 75.       Still doing childcare   Retired from [employer]   Social Drivers of [employer]: Low Risk  (09/17/2024)   Overall Financial Resource Strain (CARDIA)    Difficulty of Paying Living Expenses: Not hard at all  Food Insecurity: No Food Insecurity (09/17/2024)   Hunger Vital Sign    Worried About Running Out of Food in the Last Year: Never true    Ran Out of Food in the Last Year: Never true  Transportation Needs: No Transportation Needs (09/17/2024)   PRAPARE - Administrator, Civil Service (Medical): No    Lack of Transportation (Non-Medical): No  Physical Activity: Sufficiently Active (09/17/2024)   Exercise Vital Sign    Days of Exercise per Week: 5 days    Minutes of Exercise per Session: 60 min  Stress: No Stress Concern Present (09/17/2024)   [employer] of Occupational Health - Occupational Stress Questionnaire    Feeling of  Stress: Not at all  Social Connections: Moderately Integrated (09/17/2024)   Social Connection and Isolation Panel    Frequency of Communication with Friends and Family: More than three times a week    Frequency of Social Gatherings with Friends and Family: More than three times a week    Attends Religious Services: More than 4 times per year    Active Member of [employer] or Organizations: Yes    Attends Banker Meetings: 1 to 4 times per year    Marital Status: Divorced    Tobacco Counseling Counseling given: Not Answered    Clinical Intake:  Pre-visit preparation completed: Yes  Pain : No/denies pain     BMI - recorded: 24.79 Nutritional Status: BMI of 19-24  Normal Nutritional Risks: None Diabetes: Yes CBG done?: No Did pt. bring in CBG monitor from home?: No  Lab Results  Component Value Date   HGBA1C 6.7 (H) 08/09/2024   HGBA1C 5.5 05/03/2024   HGBA1C 6.4 01/05/2024     How often do you need to have someone help you when you read instructions, pamphlets, or other written materials from your doctor or pharmacy?: 1 - Never  Interpreter Needed?: No  Information entered by :: Ellouise Haws, LPN   Activities of Daily Living     09/17/2024    9:03 AM  In your present state of health, do you have any difficulty performing the following activities:  Hearing? 0  Vision? 0  Difficulty concentrating or making decisions? 0  Walking or climbing stairs? 1  Comment right hip and get tired but still do it  Dressing or bathing? 0  Doing errands, shopping? 0  Preparing Food and eating ? N  Using the Toilet? N  In the past six months, have you accidently leaked urine? N  Do you have problems with loss of bowel control? N  Managing your Medications? N  Managing your Finances? N  Housekeeping or managing your Housekeeping? N    Patient Care Team: Katrinka Garnette KIDD, MD as PCP - General (Family Medicine) Santo Stanly LABOR, MD as PCP - Cardiology  (Cardiology) Austin Olam CROME, MD as Consulting Physician (Ophthalmology) Beverley Evalene BIRCH, MD as Consulting Physician (Orthopedic Surgery) Pandora Cadet, Cvp [facility] as Pharmacist (Pharmacist)  I have updated your Care Teams any recent Medical Services you may have received from other providers in the past year.     Assessment:   This is a routine wellness examination for [place].  Hearing/Vision screen Hearing Screening - Comments:: Pt denies any hearing issues  Vision Screening - Comments:: Wears rx glasses - not up to date with routine eye exams with Dr sun    Goals Addressed             This Visit's Progress    Patient Stated       Work on lowering blood sugar        Depression Screen     09/17/2024    9:05 AM 08/09/2024    8:37 AM 05/03/2024    8:33 AM 09/12/2023    8:12 AM 06/20/2023    9:08 AM 01/30/2023    8:16 AM 09/02/2022    8:47 AM  PHQ 2/9 Scores  PHQ - 2 Score 0 0 0 0 0 0 0  PHQ- 9 Score 0 0   1      Fall Risk     09/17/2024    9:08 AM 08/09/2024    8:36 AM 05/03/2024    8:32 AM 09/12/2023    8:15 AM 06/20/2023    9:08 AM  Fall Risk   Falls in the past year? 0 0 0 0 0  Number falls in past yr: 0 0 0 0 0  Injury with Fall? 0 0 0 0 0  Risk for fall due to : No Fall Risks No Fall Risks No Fall Risks Impaired vision No Fall Risks  Follow up Falls prevention discussed Falls evaluation completed Falls evaluation completed Falls prevention discussed Falls evaluation completed    MEDICARE RISK AT HOME:  Medicare Risk at Home Any stairs in or around the home?: No If so, are there any  without handrails?: No Home free of loose throw rugs in walkways, pet beds, electrical cords, etc?: Yes Adequate lighting in your home to reduce risk of falls?: Yes Life alert?: No Use of a cane, walker or w/c?: No Grab bars in the bathroom?: No Shower chair or bench in shower?: Yes Elevated toilet seat or a handicapped toilet?: No  TIMED UP AND GO:  Was the test performed?  Yes   Length of time to ambulate 10 feet: 10 sec Gait steady and fast without use of assistive device  Cognitive Function: 6CIT completed    04/26/2018    9:33 AM 10/13/2016   10:13 AM  MMSE - Mini Mental State Exam  Not completed: -- --        09/17/2024    9:12 AM 09/12/2023    8:16 AM 08/23/2021    8:18 AM 07/17/2019    8:51 AM 04/26/2018    9:34 AM  6CIT Screen  What Year? 0 points 0 points 0 points 0 points 0 points  What month? 0 points 0 points 0 points 0 points 0 points  What time? 0 points 0 points 0 points 0 points 0 points  Count back from 20 0 points 0 points 0 points 0 points 0 points  Months in reverse 0 points 0 points 0 points 0 points 0 points  Repeat phrase 0 points 0 points 0 points 0 points 0 points  Total Score 0 points 0 points 0 points 0 points 0 points    Immunizations Immunization History  Administered Date(s) Administered   Fluad Quad(high Dose 65+) 10/03/2019, 09/23/2020, 09/08/2022   Fluad Trivalent(High Dose 65+) 09/12/2023   INFLUENZA, HIGH DOSE SEASONAL PF 09/29/2016, 08/23/2018   Influenza Split 09/08/2011, 10/29/2012   Influenza Whole 08/21/2008, 09/06/2010   Influenza,inj,Quad PF,6+ Mos 11/07/2013   PFIZER(Purple Top)SARS-COV-2 Vaccination 01/11/2020, 02/01/2020, 02/08/2020, 09/08/2020, 06/01/2021   PPD Test 03/24/2017, 03/25/2020   Pneumococcal Conjugate-13 11/07/2013   Pneumococcal Polysaccharide-23 12/05/2005, 09/08/2011   Td 12/05/2001   Tdap 10/29/2012   Zoster, Live 08/21/2008    Screening Tests Health Maintenance  Topic Date Due   COVID-19 Vaccine (6 - 2025-26 season) 08/05/2024   OPHTHALMOLOGY EXAM  08/30/2024   Zoster Vaccines- Shingrix (1 of 2) 11/08/2024 (Originally 02/05/1960)   Influenza Vaccine  03/04/2025 (Originally 07/05/2024)   Mammogram  11/21/2024   HEMOGLOBIN A1C  02/06/2025   Diabetic kidney evaluation - Urine ACR  05/03/2025   Diabetic kidney evaluation - eGFR measurement  08/09/2025   FOOT EXAM  08/09/2025    Medicare Annual Wellness (AWV)  09/17/2025   Pneumococcal Vaccine: 50+ Years  Completed   DEXA SCAN  Completed   Meningococcal B Vaccine  Aged Out   DTaP/Tdap/Td  Discontinued    Health Maintenance Items Addressed: Vaccines Due: and discussed   Additional Screening:  Vision Screening: Recommended annual ophthalmology exams for early detection of glaucoma and other disorders of the eye. Is the patient up to date with their annual eye exam?  No  Who is the provider or what is the name of the office in which the patient attends annual eye exams? Will follow up with new provider   Dental Screening: Recommended annual dental exams for proper oral hygiene  Community Resource Referral / Chronic Care Management: CRR required this visit?  No   CCM required this visit?  No   Plan:    I have personally reviewed and noted the following in the patient's chart:   Medical and social history Use  of alcohol, tobacco or illicit drugs  Current medications and supplements including opioid prescriptions. Patient is not currently taking opioid prescriptions. Functional ability and status Nutritional status Physical activity Advanced directives List of other physicians Hospitalizations, surgeries, and ER visits in previous 12 months Vitals Screenings to include cognitive, depression, and falls Referrals and appointments  In addition, I have reviewed and discussed with patient certain preventive protocols, quality metrics, and best practice recommendations. A written personalized care plan for preventive services as well as general preventive health recommendations were provided to patient.   Ellouise VEAR Haws, LPN   [id number]   After Visit Summary: (In Person-Printed) AVS printed and given to the patient  Notes: Nothing significant to report at this time.

## 2024-09-27 ENCOUNTER — Other Ambulatory Visit (HOSPITAL_COMMUNITY): Payer: Self-pay

## 2024-09-30 ENCOUNTER — Ambulatory Visit (INDEPENDENT_AMBULATORY_CARE_PROVIDER_SITE_OTHER): Payer: Self-pay

## 2024-09-30 ENCOUNTER — Encounter (HOSPITAL_COMMUNITY): Payer: Self-pay | Admitting: *Deleted

## 2024-09-30 ENCOUNTER — Ambulatory Visit (HOSPITAL_COMMUNITY)
Admission: EM | Admit: 2024-09-30 | Discharge: 2024-09-30 | Disposition: A | Discharge status: Home / Self Care | Payer: Self-pay | Attending: Physician Assistant | Admitting: Physician Assistant

## 2024-09-30 DIAGNOSIS — M79604 Pain in right leg: Secondary | ICD-10-CM

## 2024-09-30 MED ORDER — DICLOFENAC SODIUM 1 % EX GEL: 2.0000 g | Freq: Three times a day (TID) | CUTANEOUS | 0 refills | Status: AC | PRN
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
## 2024-10-01 ENCOUNTER — Ambulatory Visit (HOSPITAL_COMMUNITY): Payer: Self-pay

## 2024-10-05 NOTE — Progress Notes (Signed)
 Attempted to call patient at [phone] number but voicemail was full. If we can try one more time to reach her regarding her results that would be appreciated. I do agree that she should follow up with Ortho for evaluation to make sure there isn't avascular necrosis due to the imaging results.

## 2024-10-08 ENCOUNTER — Other Ambulatory Visit (HOSPITAL_COMMUNITY): Payer: Self-pay

## 2024-10-15 ENCOUNTER — Ambulatory Visit (INDEPENDENT_AMBULATORY_CARE_PROVIDER_SITE_OTHER): Admitting: Family Medicine

## 2024-10-15 ENCOUNTER — Encounter: Payer: Self-pay | Admitting: Family Medicine

## 2024-10-15 ENCOUNTER — Telehealth: Payer: Self-pay | Admitting: Family Medicine

## 2024-10-15 VITALS — BP 132/62 | HR 85 | Temp 97.4°F | Ht 61.0 in | Wt 133.4 lb

## 2024-10-15 DIAGNOSIS — M25551 Pain in right hip: Secondary | ICD-10-CM | POA: Diagnosis not present

## 2024-10-15 DIAGNOSIS — I110 Hypertensive heart disease with heart failure: Secondary | ICD-10-CM

## 2024-10-15 DIAGNOSIS — N184 Chronic kidney disease, stage 4 (severe): Secondary | ICD-10-CM

## 2024-10-15 DIAGNOSIS — E1122 Type 2 diabetes mellitus with diabetic chronic kidney disease: Secondary | ICD-10-CM

## 2024-10-15 DIAGNOSIS — E785 Hyperlipidemia, unspecified: Secondary | ICD-10-CM

## 2024-10-15 DIAGNOSIS — I5022 Chronic systolic (congestive) heart failure: Secondary | ICD-10-CM

## 2024-10-15 DIAGNOSIS — I10 Essential (primary) hypertension: Secondary | ICD-10-CM

## 2024-10-15 DIAGNOSIS — N183 Chronic kidney disease, stage 3 unspecified: Secondary | ICD-10-CM

## 2024-10-15 DIAGNOSIS — Z7984 Long term (current) use of oral hypoglycemic drugs: Secondary | ICD-10-CM

## 2024-10-15 MED ORDER — METOPROLOL SUCCINATE ER 50 MG PO TB24: 50.0000 mg | ORAL_TABLET | Freq: Every day | ORAL | 5 refills | Status: AC
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
## 2024-10-15 NOTE — Telephone Encounter (Signed)
 I spoke with Dr. Santo and he recommended starting metoprolol  back at lower dose 50 mg XR- I'm sending this in for you and lets have you see him or me back for blood pressure recheck and to see how you are doing back on medication(s) . Please call in to schedule that.

## 2024-10-15 NOTE — Patient Instructions (Addendum)
 We have placed a referral for you today to sports medicine- please call their # if you do not hear within a week (may be listed below or you may see mychart message within a few days with #).  -I'm not confident that the Voltaren gel will help much -can do ice or heat whichever feels better- heat probably works at this point  We will reach out to you about metoprolol  potentially restarting  Recommended follow up: Return for next already scheduled visit or sooner if needed.

## 2024-10-17 ENCOUNTER — Encounter: Attending: Family Medicine | Admitting: Dietician

## 2024-10-17 ENCOUNTER — Encounter: Payer: Self-pay | Admitting: Dietician

## 2024-10-17 DIAGNOSIS — E1122 Type 2 diabetes mellitus with diabetic chronic kidney disease: Secondary | ICD-10-CM | POA: Diagnosis present

## 2024-10-17 DIAGNOSIS — N183 Chronic kidney disease, stage 3 unspecified: Secondary | ICD-10-CM | POA: Insufficient documentation
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
## 2024-10-17 NOTE — Telephone Encounter (Signed)
 Team please call her with the below note

## 2024-10-17 NOTE — Patient Instructions (Signed)
 Avoid/limit processed meat such as sausage, bacon, hot dogs, ham. Continue to avoid added salt Avoid processed foods. It is best to cook your own. Portion size of condiments   Continue to stay active. Continue to eat a diet rich in non-starchy vegetables, fresh fruit, lean protein, whole grains.

## 2024-10-18 NOTE — Telephone Encounter (Signed)
 Called pt and per DPR left detailed vm advising pt of PCP and Dr Milan recommendations. Advised Rx sent to pharmacy, and pt will need to follow up with PCP or specialist for BP follow-up/recheck. Advised office contact number to call and schedule appt here in office or to return my call with any questions/concerns.

## 2024-10-18 NOTE — Telephone Encounter (Signed)
 Has appointment with Cardiologist in Jan, will follow about medication then. Also going to pharmacy to get medication today.
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
## 2024-10-29 ENCOUNTER — Ambulatory Visit: Admitting: Sports Medicine

## 2024-10-29 VITALS — BP 130/82 | Ht 61.0 in | Wt 132.0 lb

## 2024-10-29 DIAGNOSIS — G8929 Other chronic pain: Secondary | ICD-10-CM

## 2024-10-29 DIAGNOSIS — M1611 Unilateral primary osteoarthritis, right hip: Secondary | ICD-10-CM | POA: Diagnosis not present

## 2024-10-29 DIAGNOSIS — M25551 Pain in right hip: Secondary | ICD-10-CM | POA: Diagnosis not present

## 2024-10-29 NOTE — Patient Instructions (Addendum)
 Right hip MRI  Follow up 1 week after to discuss results

## 2024-10-29 NOTE — Addendum Note (Signed)
 Addended by: Lexianna Weinrich on: 10/29/2024 09:17 AM   Modules accepted: Orders

## 2024-11-03 ENCOUNTER — Other Ambulatory Visit: Payer: Self-pay | Admitting: Family Medicine

## 2024-11-08 LAB — LAB REPORT - SCANNED: EGFR: 29

## 2024-11-12 ENCOUNTER — Inpatient Hospital Stay: Attending: Physician Assistant

## 2024-11-12 ENCOUNTER — Other Ambulatory Visit: Payer: Self-pay

## 2024-11-12 DIAGNOSIS — D472 Monoclonal gammopathy: Secondary | ICD-10-CM | POA: Insufficient documentation

## 2024-11-12 LAB — CBC WITH DIFFERENTIAL (CANCER CENTER ONLY)
Abs Immature Granulocytes: 0.01 K/uL (ref 0.00–0.07)
Basophils Absolute: 0 K/uL (ref 0.0–0.1)
Basophils Relative: 1 %
Eosinophils Absolute: 0.1 K/uL (ref 0.0–0.5)
Eosinophils Relative: 2 %
HCT: 36.2 % (ref 36.0–46.0)
Hemoglobin: 12.3 g/dL (ref 12.0–15.0)
Immature Granulocytes: 0 %
Lymphocytes Relative: 21 %
Lymphs Abs: 1.1 K/uL (ref 0.7–4.0)
MCH: 28.6 pg (ref 26.0–34.0)
MCHC: 34 g/dL (ref 30.0–36.0)
MCV: 84.2 fL (ref 80.0–100.0)
Monocytes Absolute: 0.4 K/uL (ref 0.1–1.0)
Monocytes Relative: 8 %
Neutro Abs: 3.4 K/uL (ref 1.7–7.7)
Neutrophils Relative %: 68 %
Platelet Count: 183 K/uL (ref 150–400)
RBC: 4.3 MIL/uL (ref 3.87–5.11)
RDW: 14.6 % (ref 11.5–15.5)
WBC Count: 5.1 K/uL (ref 4.0–10.5)
nRBC: 0 % (ref 0.0–0.2)

## 2024-11-12 LAB — CMP (CANCER CENTER ONLY)
ALT: 12 U/L (ref 0–44)
AST: 24 U/L (ref 15–41)
Albumin: 4 g/dL (ref 3.5–5.0)
Alkaline Phosphatase: 124 U/L (ref 38–126)
Anion gap: 9 (ref 5–15)
BUN: 32 mg/dL — ABNORMAL HIGH (ref 8–23)
CO2: 26 mmol/L (ref 22–32)
Calcium: 9.4 mg/dL (ref 8.9–10.3)
Chloride: 107 mmol/L (ref 98–111)
Creatinine: 1.58 mg/dL — ABNORMAL HIGH (ref 0.44–1.00)
GFR, Estimated: 32 mL/min — ABNORMAL LOW (ref >60)
Glucose, Bld: 86 mg/dL (ref 70–99)
Potassium: 3.9 mmol/L (ref 3.5–5.1)
Sodium: 142 mmol/L (ref 135–145)
Total Bilirubin: 0.6 mg/dL (ref 0.0–1.2)
Total Protein: 7 g/dL (ref 6.5–8.1)

## 2024-11-12 LAB — LACTATE DEHYDROGENASE: LDH: 212 U/L (ref 105–235)

## 2024-11-13 LAB — BETA 2 MICROGLOBULIN, SERUM: Beta-2 Microglobulin: 3.9 mg/L — ABNORMAL HIGH (ref 0.6–2.4)

## 2024-11-13 LAB — KAPPA/LAMBDA LIGHT CHAINS
Kappa free light chain: 48.8 mg/L — ABNORMAL HIGH (ref 3.3–19.4)
Kappa, lambda light chain ratio: 1.34 (ref 0.26–1.65)
Lambda free light chains: 36.3 mg/L — ABNORMAL HIGH (ref 5.7–26.3)

## 2024-11-14 LAB — MULTIPLE MYELOMA PANEL, SERUM
Albumin SerPl Elph-Mcnc: 3.6 g/dL (ref 2.9–4.4)
Albumin/Glob SerPl: 1.3 (ref 0.7–1.7)
Alpha 1: 0.2 g/dL (ref 0.0–0.4)
Alpha2 Glob SerPl Elph-Mcnc: 0.7 g/dL (ref 0.4–1.0)
B-Globulin SerPl Elph-Mcnc: 1 g/dL (ref 0.7–1.3)
Gamma Glob SerPl Elph-Mcnc: 1 g/dL (ref 0.4–1.8)
Globulin, Total: 2.9 g/dL (ref 2.2–3.9)
IgA: 221 mg/dL (ref 64–422)
IgG (Immunoglobin G), Serum: 1078 mg/dL (ref 586–1602)
IgM (Immunoglobulin M), Srm: 67 mg/dL (ref 26–217)
M Protein SerPl Elph-Mcnc: 0.3 g/dL — ABNORMAL HIGH
Total Protein ELP: 6.5 g/dL (ref 6.0–8.5)

## 2024-11-18 ENCOUNTER — Ambulatory Visit: Admitting: Family Medicine

## 2024-11-19 ENCOUNTER — Ambulatory Visit: Payer: Self-pay

## 2024-11-19 NOTE — Telephone Encounter (Signed)
 FYI Only or Action Required?: FYI only for provider: appointment scheduled on 11/20/24.  Patient was last seen in primary care on 10/15/2024 by Maria Garnette KIDD, MD.  Called Nurse Triage reporting Cough and Diarrhea.  Symptoms began several days ago.  Interventions attempted: OTC medications: Delsym.  Symptoms are: gradually worsening.  Triage Disposition: See Physician Within 24 Hours  Patient/caregiver understands and will follow disposition?: Yes    Reason for Disposition  SEVERE coughing spells (e.g., whooping sound after coughing, vomiting after coughing)  Answer Assessment - Initial Assessment Questions Pt reports productive cough starting 2 days ago and 1 day of diarrhea yesterday. Diarrhea now resolved, cough persists. Coughing up brown to clear mucus, denies blood. Reports it is a nasty cough, worse at night. Denies fever, CP or SOB. Scheduled appt with provider at different office in pt region d/t no availability at home office with any provider within timeframe. Advised UC or ED for worsening symptoms.   1. ONSET: When did the cough begin?      2 days ago  2. SEVERITY: How bad is the cough today?      Nasty cough, coughed all night long  3. SPUTUM: Describe the color of your sputum (e.g., none, dry cough; clear, white, yellow, green)     Brown to clear  4. HEMOPTYSIS: Are you coughing up any blood? If Yes, ask: How much? (e.g., flecks, streaks, tablespoons, etc.)     Denies  5. DIFFICULTY BREATHING: Are you having difficulty breathing? If Yes, ask: How bad is it? (e.g., mild, moderate, severe)      Denies  6. FEVER: Do you have a fever? If Yes, ask: What is your temperature, how was it measured, and when did it start?     Denies  7. CARDIAC HISTORY: Do you have any history of heart disease? (e.g., heart attack, congestive heart failure)      CHF and htn  8. LUNG HISTORY: Do you have any history of lung disease?  (e.g., pulmonary  embolus, asthma, emphysema)     Denies  9. PE RISK FACTORS: Do you have a history of blood clots? (or: recent major surgery, recent prolonged travel, bedridden)     Denies  10. OTHER SYMPTOMS: Do you have any other symptoms? (e.g., runny nose, wheezing, chest pain)       Runny nose  Protocols used: Cough - Acute Productive-A-AH

## 2024-11-19 NOTE — Telephone Encounter (Signed)
 Message from Harlene ORN sent at 11/19/2024  7:45 AM EST  Reason for Triage: severe cough and diarrhea getting worse.   3rd attempt to contact patient-no answer. Voicemail left for patient to call back to Nurse Triage. Will route to office.

## 2024-11-19 NOTE — Telephone Encounter (Signed)
 Attempt # 1 to reach patient to triage symptoms. Left VM to call back    Message from Harlene ORN sent at 11/19/2024  7:45 AM EST  Reason for Triage: severe cough and diarrhea getting worse.

## 2024-11-19 NOTE — Telephone Encounter (Signed)
 Noted. Pt missed office appt yesterday and I also called her then with no answer. Advise urgent care or first available appt within [place].

## 2024-11-19 NOTE — Telephone Encounter (Signed)
 Attempt # 2 to reach patient to triage symptoms. Left VM to call back      Message from Harlene ORN sent at 11/19/2024  7:45 AM EST   Reason for Triage: severe cough and diarrhea getting worse.

## 2024-11-20 ENCOUNTER — Ambulatory Visit: Admitting: Internal Medicine

## 2024-11-20 ENCOUNTER — Encounter: Payer: Self-pay | Admitting: Internal Medicine

## 2024-11-20 VITALS — BP 130/70 | HR 90 | Temp 98.3°F | Ht 61.0 in | Wt 130.0 lb

## 2024-11-20 DIAGNOSIS — R058 Other specified cough: Secondary | ICD-10-CM | POA: Diagnosis not present

## 2024-11-20 LAB — POCT INFLUENZA A/B
Influenza A, POC: NEGATIVE
Influenza B, POC: NEGATIVE

## 2024-11-20 LAB — POC COVID19 BINAXNOW: SARS Coronavirus 2 Ag: NEGATIVE

## 2024-11-20 MED ORDER — FLUTICASONE PROPIONATE 50 MCG/ACT NA SUSP: 2.0000 | Freq: Every day | NASAL | 6 refills | Status: AC

## 2024-11-20 MED ORDER — PROMETHAZINE-DM 6.25-15 MG/5ML PO SYRP: 5.0000 mL | ORAL_SOLUTION | Freq: Four times a day (QID) | ORAL | 0 refills | Status: AC | PRN

## 2024-11-20 NOTE — Progress Notes (Signed)
° °  Subjective:   Patient ID: [name], female    DOB: May 20, 1941, 83 y.o.   MRN: [id number]  Discussed the use of AI scribe software for clinical note transcription with the patient, who gave verbal consent to proceed.  History of Present Illness Maria [name] is an 83 year old female with diabetes and congestive heart failure who presents with cough and upper respiratory symptoms.  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 A child and several coworkers were also sick.  Her symptoms began with a tickling sensation in her throat, progressing to a cough. She has been producing phlegm, initially greenish-brown and thick, now white. No fever, chills, muscle aches, headaches, sinus pressure, or ear pain. She has a runny nose, persistent cough, and frequent sneezing.  Her appetite has decreased, and she has been consuming mostly liquids and small meals, such as oatmeal and tea. She notes a slight weight loss, currently weighing around 130 pounds, down from 132.5 pounds.  She has been using Delsym for her cough every twelve hours but finds it ineffective. She has been wearing a mask consistently to prevent spreading illness and has been staying home from work to avoid infecting others.  Review of Systems  Constitutional:  Positive for activity change and appetite change. Negative for chills, fatigue, fever and unexpected weight change.  HENT:  Positive for congestion, postnasal drip and rhinorrhea. Negative for ear discharge, ear pain, sinus pressure, sinus pain, sneezing, sore throat, tinnitus, trouble swallowing and voice change.   Eyes: Negative.   Respiratory:  Positive for cough. Negative for chest tightness, shortness of breath and wheezing.   Cardiovascular: Negative.   Gastrointestinal: Negative.   Musculoskeletal:  Negative for myalgias.  Neurological: Negative.     Objective:   Physical Exam Constitutional:      Appearance: She is well-developed.  HENT:     Head: Normocephalic and atraumatic.     Comments: Oropharynx with redness and clear drainage, nose with swollen turbinates, TMs normal bilaterally.  Neck:     Thyroid : No thyromegaly.  Cardiovascular:     Rate and Rhythm: Normal rate and regular rhythm.  Pulmonary:     Effort: Pulmonary effort is normal. No respiratory distress.     Breath sounds: Normal breath sounds. No wheezing or rales.  Abdominal:     Palpations: Abdomen is soft.  Musculoskeletal:        General: No tenderness.     Cervical back: Normal range of motion.  Lymphadenopathy:     Cervical: No cervical adenopathy.  Skin:    General: Skin is warm and dry.  Neurological:     Mental Status: She is alert and oriented to person, place, and time.     Vitals:   11/20/24 1114  BP: 130/70  Pulse: 90  Temp: 98.3 F (36.8 C)  TempSrc: Oral  SpO2: 98%  Weight: 130 lb (59 kg)  Height: 5' 1 (1.549 m)   Assessment and Plan Assessment & Plan Acute upper respiratory infection   Likely viral etiology with negative flu and COVID tests done in office today. She has a severe persistent cough with sputum color change but no fever or pneumonia signs. Prescribed promethazine /dm prescription cough medicine safe for heart and blood pressure, and flonase  nasal spray for congestion and drainage. Advised continued use of tissues for secretions.

## 2024-11-20 NOTE — Patient Instructions (Addendum)
 We have sent in promethazine /dm to use for cough up to 3 times a day use 5 mL.   We have also sent in flonase  which is a nose spray to use 2 sprays in each nostril once a day for the next 1-2 weeks.  Your flu and covid-19 tests are negative today.

## 2024-11-20 NOTE — Telephone Encounter (Signed)
 Noted

## 2024-11-22 ENCOUNTER — Encounter: Payer: Self-pay | Admitting: Family Medicine

## 2024-11-26 ENCOUNTER — Ambulatory Visit
Admission: RE | Admit: 2024-11-26 | Discharge: 2024-11-26 | Disposition: A | Discharge status: Home / Self Care | Source: Ambulatory Visit | Attending: Sports Medicine | Admitting: Sports Medicine

## 2024-11-26 ENCOUNTER — Ambulatory Visit: Admitting: Physician Assistant

## 2024-11-26 DIAGNOSIS — G8929 Other chronic pain: Secondary | ICD-10-CM

## 2024-11-26 DIAGNOSIS — M1611 Unilateral primary osteoarthritis, right hip: Secondary | ICD-10-CM

## 2024-11-29 LAB — HM MAMMOGRAPHY

## 2024-12-02 ENCOUNTER — Telehealth: Payer: Self-pay | Admitting: Family Medicine

## 2024-12-02 NOTE — Telephone Encounter (Signed)
 LVM advising pt the 11/18/24 appt was a 14 wk fu with provider. Advised this appointment needs to be rescheduled.

## 2024-12-02 NOTE — Telephone Encounter (Signed)
 Copied from CRM #[id number]. Topic: Appointments - Scheduling Inquiry for Clinic >> Nov 29, 2024  4:48 PM Alfonso ORN wrote: Reason for CRM:  pt stated she was told pcp was busy 12/15 and was unaware that the appt was scheduled but was seen 12/17. This 12/15 appt is showing as a no show . Please call pt back to advise

## 2024-12-04 NOTE — Telephone Encounter (Signed)
 Copied from CRM [id number]. Topic: Appointments - Scheduling Inquiry for Clinic >> Dec 03, 2024  4:05 PM Sasha M wrote: Reason for CRM: Pt called in to reschedule her missed appt from 12/15. She wanted me to send Dr Katrinka a msg and explain that this was a miscommunication issue and she would never just not show up to an appt. She became sick the Sunday before her appt and thought her appt was for a different day. She states that she called in to see if she could come and see him that Monday and was told no because he was booked up that day but whoever she spoke to did not tell her she already had an appt that morning. She is feeling much better now but still has a terrible cough and even though I scheduled her appt (and added it to the wait list) she would like to request consideration for something sooner in the schedule if something is available.

## 2024-12-13 ENCOUNTER — Ambulatory Visit: Admitting: Sports Medicine

## 2024-12-13 VITALS — Ht 61.0 in | Wt 130.0 lb

## 2024-12-13 DIAGNOSIS — M1611 Unilateral primary osteoarthritis, right hip: Secondary | ICD-10-CM | POA: Diagnosis not present

## 2024-12-13 DIAGNOSIS — M25551 Pain in right hip: Secondary | ICD-10-CM | POA: Diagnosis not present

## 2024-12-13 DIAGNOSIS — M7061 Trochanteric bursitis, right hip: Secondary | ICD-10-CM

## 2024-12-13 DIAGNOSIS — G8929 Other chronic pain: Secondary | ICD-10-CM
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
## 2024-12-13 NOTE — Patient Instructions (Signed)
 4 week follow up

## 2024-12-19 ENCOUNTER — Other Ambulatory Visit: Payer: Self-pay | Admitting: Family Medicine

## 2024-12-19 NOTE — Progress Notes (Unsigned)
 " [facility] [phone]   [phone]  PROGRESS NOTE  Patient Care Team: Katrinka Garnette KIDD, MD as PCP - General (Family Medicine) Santo Stanly LABOR, MD as PCP - Cardiology (Cardiology) Austin Olam CROME, MD as Consulting Physician (Ophthalmology) Beverley Evalene BIRCH, MD as Consulting Physician (Orthopedic Surgery) Pandora Cadet, [facility] as Pharmacist (Pharmacist)  Hematological/Oncological History # Monoclonal Gammopathy of Undetermined Significance 10/23/2023: M protein 0.4, kappa 56.6, Lambda 38.7, ratio 1.46 12/04/2023: establish care with Dr. Federico   CHIEF COMPLAINTS/PURPOSE OF CONSULTATION:  MGUS  HISTORY OF PRESENTING ILLNESS:  Maria [name] 84 y.o. 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  She is unaccompanied for this visit.  Ms. Tennis reports *** Past Medical History:  Diagnosis Date   Arthritis    Bronchitis    hx of   Chronic kidney disease    Diabetes mellitus without complication (HCC)    GERD (gastroesophageal reflux disease)    History of bleeding ulcers    History of gastrointestinal hemorrhage    now off aspirin    Hyperlipidemia    Hypertension    Osteoarthritis of right knee 06/03/2012    SURGICAL HISTORY: Past Surgical History:  Procedure Laterality Date   ABDOMINAL HYSTERECTOMY     nonmalignant reasons   APPENDECTOMY     RIGHT/LEFT HEART CATH AND CORONARY ANGIOGRAPHY N/A 02/25/2022   Procedure: RIGHT/LEFT HEART CATH AND CORONARY ANGIOGRAPHY;  Surgeon: Verlin Lonni BIRCH, MD;  Location: [facility];  Service: Cardiovascular;  Laterality: N/A;   TOTAL KNEE ARTHROPLASTY  05/30/2012   Procedure: TOTAL KNEE ARTHROPLASTY;  Surgeon: Toribio JULIANNA Beverley, MD;  Location: [facility] OR;  Service: Orthopedics;  Laterality: Right;  DR MURPHY WANTS 90 MINUTES FOR THIS CASE. 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   Procedure: LEFT TOTAL KNEE ARTHROPLASTY;  Surgeon: Toribio JULIANNA Beverley, MD;  Location: [facility] OR;  Service:  Orthopedics;  Laterality: Left;    SOCIAL HISTORY: Social History   Socioeconomic History   Marital status: Divorced    Spouse name: Not on file   Number of children: Not on file   Years of education: Not on file   Highest education level: Not on file  Occupational History   Occupation: day care   Tobacco Use   Smoking status: Never   Smokeless tobacco: Never  Vaping Use   Vaping status: Never Used  Substance and Sexual Activity   Alcohol use: No   Drug use: No   Sexual activity: Not Currently  Other Topics Concern   Not on file  Social History Narrative   Lives alone. Great granddaughter stays with her on weekends.    Divorced 89.       Still doing childcare   Retired from [employer]   Social Drivers of Health   Tobacco Use: Low Risk (11/20/2024)   Patient History    Smoking Tobacco Use: Never    Smokeless Tobacco Use: Never    Passive Exposure: Not on file  Financial Resource Strain: Low Risk (09/17/2024)   Overall Financial Resource Strain (CARDIA)    Difficulty of Paying Living Expenses: Not hard at all  Food Insecurity: No Food Insecurity (09/17/2024)   Epic    Worried About Programme Researcher, Broadcasting/film/video in the Last Year: Never true    Ran Out of Food in the Last Year: Never true  Transportation Needs: No Transportation Needs (09/17/2024)   Epic    Lack of Transportation (Medical): No    Lack of Transportation (  Non-Medical): No  Physical Activity: Sufficiently Active (09/17/2024)   Exercise Vital Sign    Days of Exercise per Week: 5 days    Minutes of Exercise per Session: 60 min  Stress: No Stress Concern Present (09/17/2024)   [employer] of Occupational Health - Occupational Stress Questionnaire    Feeling of Stress: Not at all  Social Connections: Moderately Integrated (09/17/2024)   Social Connection and Isolation Panel    Frequency of Communication with Friends and Family: More than three times a week    Frequency of Social Gatherings with  Friends and Family: More than three times a week    Attends Religious Services: More than 4 times per year    Active Member of Clubs or Organizations: Yes    Attends Banker Meetings: 1 to 4 times per year    Marital Status: Divorced  Intimate Partner Violence: Not At Risk (09/17/2024)   Epic    Fear of Current or Ex-Partner: No    Emotionally Abused: No    Physically Abused: No    Sexually Abused: No  Depression (PHQ2-9): Low Risk (10/15/2024)   Depression (PHQ2-9)    PHQ-2 Score: 0  Alcohol Screen: Low Risk (09/17/2024)   Alcohol Screen    Last Alcohol Screening Score (AUDIT): 0  Housing: Unknown (09/17/2024)   Epic    Unable to Pay for Housing in the Last Year: No    Number of Times Moved in the Last Year: Not on file    Homeless in the Last Year: No  Utilities: Not At Risk (09/17/2024)   Epic    Threatened with loss of utilities: No  Health Literacy: Adequate Health Literacy (09/17/2024)   B1300 Health Literacy    Frequency of need for help with medical instructions: Never    FAMILY HISTORY: Family History  Problem Relation Age of Onset   Congestive Heart Failure Mother        2, only child   Other Father        was not in patient life    ALLERGIES:  is allergic to coumadin [warfarin sodium], hydrocodone , methocarbamol , and norco [hydrocodone -acetaminophen ].  MEDICATIONS:  Current Outpatient Medications  Medication Sig Dispense Refill   ACCU-CHEK GUIDE TEST test strip TEST MORNING, NOON AND BEDTIME 100 strip 0   Accu-Chek Softclix Lancets lancets      atorvastatin  (LIPITOR) 80 MG tablet Take 1 tablet (80 mg total) by mouth daily. 90 tablet 3   Blood Glucose Monitoring Suppl DEVI 1 each by Does not apply route in the morning, at noon, and at bedtime. May substitute to any manufacturer covered by patient's insurance. 1 each 0   Calcium  Carbonate-Vitamin D (CALCIUM  PLUS VITAMIN D PO) Take 2 tablets by mouth daily.     cyanocobalamin  100 MCG tablet Take  500 mcg by mouth daily.     dapagliflozin  propanediol (FARXIGA ) 10 MG TABS tablet Take 1 tablet (10 mg total) by mouth daily before breakfast. 90 tablet 3   diclofenac  Sodium (VOLTAREN ) 1 % GEL Apply 2 g topically every 8 (eight) hours as needed. 150 g 0   fluticasone  (FLONASE ) 50 MCG/ACT nasal spray Place 2 sprays into both nostrils daily. 16 g 6   furosemide  (LASIX ) 20 MG tablet TAKE 1 TABLET BY MOUTH AS NEEDED AS DIRECTED 90 tablet 3   metoprolol  succinate (TOPROL -XL) 50 MG 24 hr tablet Take 1 tablet (50 mg total) by mouth daily. Take with or immediately following a meal. 30 tablet 5  Multiple Vitamins-Minerals (MEGAVITE FRUITS & VEGGIES PO) Take by mouth. Balance nature fruits and veggies     omeprazole  (PRILOSEC) 20 MG capsule TAKE 1 CAPSULE(20 MG) BY MOUTH DAILY 90 capsule 1   promethazine -dextromethorphan (PROMETHAZINE -DM) 6.25-15 MG/5ML syrup Take 5 mLs by mouth 4 (four) times daily as needed. 118 mL 0   sacubitril -valsartan  (ENTRESTO ) 97-103 MG Take 1 tablet by mouth 2 (two) times daily. 180 tablet 3   spironolactone  (ALDACTONE ) 25 MG tablet TAKE 1 TABLET(25 MG) BY MOUTH DAILY 90 tablet 1   spironolactone  (ALDACTONE ) 25 MG tablet TAKE 1 TABLET(25 MG) BY MOUTH DAILY 90 tablet 1   No current facility-administered medications for this visit.    REVIEW OF SYSTEMS:   Constitutional: ( - ) fevers, ( - )  chills , ( - ) night sweats Eyes: ( - ) blurriness of vision, ( - ) double vision, ( - ) watery eyes Ears, nose, mouth, throat, and face: ( - ) mucositis, ( - ) sore throat Respiratory: ( - ) cough, ( - ) dyspnea, ( - ) wheezes Cardiovascular: ( - ) palpitation, ( - ) chest discomfort, ( - ) lower extremity swelling Gastrointestinal:  ( - ) nausea, ( - ) heartburn, ( - ) change in bowel habits Skin: ( - ) abnormal skin rashes Lymphatics: ( - ) new lymphadenopathy, ( - ) easy bruising Neurological: ( - ) numbness, ( - ) tingling, ( - ) new weaknesses Behavioral/Psych: ( - ) mood change,  ( - ) new changes  All other systems were reviewed with the patient and are negative.  PHYSICAL EXAMINATION:  There were no vitals filed for this visit.  There were no vitals filed for this visit.   GENERAL: well appearing elderly African-American female in NAD  SKIN: skin color, texture, turgor are normal, no rashes or significant lesions EYES: conjunctiva are pink and non-injected, sclera clear LUNGS: clear to auscultation and percussion with normal breathing effort HEART: regular rate & rhythm and no murmurs and no lower extremity edema Musculoskeletal: no cyanosis of digits and no clubbing  PSYCH: alert & oriented x 3, fluent speech NEURO: no focal motor/sensory deficits  LABORATORY DATA:  I have reviewed the data as listed    Latest Ref Rng & Units 11/12/2024   10:00 AM 08/09/2024    9:41 AM 07/18/2024    2:13 PM  CBC  WBC 4.0 - 10.5 K/uL 5.1  4.6  5.1   Hemoglobin 12.0 - 15.0 g/dL 87.6  86.7  86.2   Hematocrit 36.0 - 46.0 % 36.2  41.2  42.2   Platelets 150 - 400 K/uL 183  215.0  211        Latest Ref Rng & Units 11/12/2024   10:00 AM 08/09/2024    9:41 AM 07/18/2024    2:13 PM  CMP  Glucose 70 - 99 mg/dL 86  93  94   BUN 8 - 23 mg/dL 32  43  27   Creatinine 0.44 - 1.00 mg/dL 8.41  8.35  8.35   Sodium 135 - 145 mmol/L 142  139  140   Potassium 3.5 - 5.1 mmol/L 3.9  4.8  4.6   Chloride 98 - 111 mmol/L 107  104  106   CO2 22 - 32 mmol/L 26  26  23    Calcium  8.9 - 10.3 mg/dL 9.4  89.8  9.5   Total Protein 6.5 - 8.1 g/dL 7.0  7.3    Total Bilirubin 0.0 -  1.2 mg/dL 0.6  0.6    Alkaline Phos 38 - 126 U/L 124  110    AST 15 - 41 U/L 24  24    ALT 0 - 44 U/L 12  14       ASSESSMENT & PLAN Maria [name] is a 84 y.o. female who returns for continued management of MGUS   #Monoclonal Gammopathy of Undetermined Significance --Labs from 11/12/2024 were reviewed with the patient.  CBC showed no cytopenias, creatinine was stable at 1.56, normal calcium  levels.  SPEP showed  stable M protein measuring 0.3 g/dL.  Serum free light chains are stable with kappa light chains measuring 48.8 mg/L, lambda light chains measuring 36.3 mg/L and ratio 1.34 --Most recent 24-hour UPEP from 12/14/2023 was unremarkable without any evidence of monoclonal protein.  Repeat annually, due around January 2026.*** --Most recent bone met survey from 12/14/2023 was negative for lytic lesions.  Repeat annually, due around January 2026.*** --No signs or symptoms of clinical transformation to active myeloma.  Continue with surveillance. --[facility] in 6 months [facility]    No orders of the defined types were placed in this encounter.   All questions were answered. The patient knows to call the clinic with any problems, questions or concerns.   I have spent a total of 25 minutes minutes of face-to-face and non-face-to-face time, preparing to see the patient, obtaining and/or reviewing separately obtained history, performing a medically appropriate examination, counseling and educating the patient, ordering medications/tests/procedures, documenting clinical information in the electronic health record, independently interpreting results and communicating results to the patient, and care coordination.    Johnston Police PA-C Dept of Hematology and Oncology [facility] Phone: [phone]   12/19/2024 11:08 PM  "

## 2024-12-20 ENCOUNTER — Inpatient Hospital Stay: Attending: Physician Assistant | Admitting: Physician Assistant

## 2024-12-25 ENCOUNTER — Telehealth: Payer: Self-pay | Admitting: Physician Assistant

## 2024-12-25 NOTE — Telephone Encounter (Signed)
 Spoke to pt about reschedule

## 2024-12-27 ENCOUNTER — Telehealth: Payer: Self-pay | Admitting: Physician Assistant

## 2024-12-27 NOTE — Telephone Encounter (Signed)
 I spoke with patient as she called in to reschedule provider appointment from 01/14/2025 to 01/15/2025. Patient aware of date/time change.

## 2025-01-10 ENCOUNTER — Ambulatory Visit: Admitting: Family Medicine

## 2025-01-10 ENCOUNTER — Other Ambulatory Visit (HOSPITAL_COMMUNITY): Payer: Self-pay

## 2025-01-14 ENCOUNTER — Inpatient Hospital Stay: Admitting: Physician Assistant

## 2025-01-14 ENCOUNTER — Ambulatory Visit: Admitting: Sports Medicine

## 2025-01-15 ENCOUNTER — Inpatient Hospital Stay: Attending: Physician Assistant | Admitting: Physician Assistant

## 2025-01-16 ENCOUNTER — Encounter: Admitting: Dietician

## 2025-01-20 ENCOUNTER — Ambulatory Visit: Admitting: Family Medicine

## 2025-09-23 ENCOUNTER — Ambulatory Visit
